# Patient Record
Sex: Male | Born: 1955
Health system: Southern US, Community
[De-identification: ages and names within clinical notes are randomized; demographics above are authoritative.]

## PROBLEM LIST (undated history)

## (undated) DIAGNOSIS — G4733 Obstructive sleep apnea (adult) (pediatric): Secondary | ICD-10-CM

## (undated) DIAGNOSIS — F419 Anxiety disorder, unspecified: Secondary | ICD-10-CM

## (undated) DIAGNOSIS — K7581 Nonalcoholic steatohepatitis (NASH): Secondary | ICD-10-CM

## (undated) DIAGNOSIS — F329 Major depressive disorder, single episode, unspecified: Secondary | ICD-10-CM

## (undated) DIAGNOSIS — M25519 Pain in unspecified shoulder: Secondary | ICD-10-CM

## (undated) DIAGNOSIS — M25569 Pain in unspecified knee: Secondary | ICD-10-CM

## (undated) DIAGNOSIS — C8590 Non-Hodgkin lymphoma, unspecified, unspecified site: Secondary | ICD-10-CM

## (undated) DIAGNOSIS — I739 Peripheral vascular disease, unspecified: Secondary | ICD-10-CM

## (undated) DIAGNOSIS — J449 Chronic obstructive pulmonary disease, unspecified: Secondary | ICD-10-CM

## (undated) DIAGNOSIS — N183 Chronic kidney disease, stage 3 unspecified: Secondary | ICD-10-CM

## (undated) DIAGNOSIS — J9610 Chronic respiratory failure, unspecified whether with hypoxia or hypercapnia: Secondary | ICD-10-CM

## (undated) DIAGNOSIS — K7460 Unspecified cirrhosis of liver: Secondary | ICD-10-CM

## (undated) DIAGNOSIS — I4821 Permanent atrial fibrillation: Secondary | ICD-10-CM

## (undated) DIAGNOSIS — I4819 Other persistent atrial fibrillation: Secondary | ICD-10-CM

## (undated) DIAGNOSIS — E662 Morbid (severe) obesity with alveolar hypoventilation: Secondary | ICD-10-CM

## (undated) DIAGNOSIS — I2781 Cor pulmonale (chronic): Secondary | ICD-10-CM

## (undated) DIAGNOSIS — I2609 Other pulmonary embolism with acute cor pulmonale: Secondary | ICD-10-CM

## (undated) DIAGNOSIS — F32A Depression, unspecified: Secondary | ICD-10-CM

## (undated) DIAGNOSIS — I8390 Asymptomatic varicose veins of unspecified lower extremity: Secondary | ICD-10-CM

## (undated) DIAGNOSIS — M549 Dorsalgia, unspecified: Secondary | ICD-10-CM

## (undated) DIAGNOSIS — M7989 Other specified soft tissue disorders: Secondary | ICD-10-CM

## (undated) DIAGNOSIS — IMO0002 Reserved for concepts with insufficient information to code with codable children: Secondary | ICD-10-CM

## (undated) DIAGNOSIS — C801 Malignant (primary) neoplasm, unspecified: Secondary | ICD-10-CM

## (undated) DIAGNOSIS — K469 Unspecified abdominal hernia without obstruction or gangrene: Secondary | ICD-10-CM

## (undated) DIAGNOSIS — I509 Heart failure, unspecified: Secondary | ICD-10-CM

## (undated) DIAGNOSIS — I5032 Chronic diastolic (congestive) heart failure: Secondary | ICD-10-CM

## (undated) DIAGNOSIS — I272 Pulmonary hypertension, unspecified: Secondary | ICD-10-CM

## (undated) DIAGNOSIS — M1990 Unspecified osteoarthritis, unspecified site: Secondary | ICD-10-CM

## (undated) DIAGNOSIS — R600 Localized edema: Secondary | ICD-10-CM

## (undated) HISTORY — PX: OTHER SURGICAL HISTORY: SHX169

## (undated) HISTORY — DX: Reserved for concepts with insufficient information to code with codable children: IMO0002

## (undated) HISTORY — PX: APPENDECTOMY: SHX54

## (undated) HISTORY — DX: Pain in unspecified knee: M25.569

## (undated) HISTORY — PX: HERNIA REPAIR: SHX51

## (undated) HISTORY — DX: Dorsalgia, unspecified: M54.9

## (undated) HISTORY — DX: Other persistent atrial fibrillation: I48.19

## (undated) HISTORY — DX: Asymptomatic varicose veins of unspecified lower extremity: I83.90

## (undated) HISTORY — DX: Unspecified osteoarthritis, unspecified site: M19.90

## (undated) HISTORY — DX: Pain in unspecified shoulder: M25.519

---

## 1998-10-02 ENCOUNTER — Encounter: Admission: RE | Admit: 1998-10-02 | Discharge: 1998-11-19 | Payer: Self-pay | Admitting: Family Medicine

## 1998-11-15 HISTORY — PX: PORTACATH PLACEMENT: SHX2246

## 1999-09-24 ENCOUNTER — Ambulatory Visit (HOSPITAL_COMMUNITY): Admission: RE | Admit: 1999-09-24 | Discharge: 1999-09-24 | Payer: Self-pay | Admitting: Urology

## 1999-09-24 ENCOUNTER — Encounter (INDEPENDENT_AMBULATORY_CARE_PROVIDER_SITE_OTHER): Payer: Self-pay | Admitting: Specialist

## 2000-03-11 ENCOUNTER — Encounter: Payer: Self-pay | Admitting: Internal Medicine

## 2000-03-11 ENCOUNTER — Emergency Department (HOSPITAL_COMMUNITY): Admission: EM | Admit: 2000-03-11 | Discharge: 2000-03-11 | Payer: Self-pay | Admitting: Internal Medicine

## 2004-09-25 ENCOUNTER — Encounter: Admission: RE | Admit: 2004-09-25 | Discharge: 2004-09-25 | Payer: Self-pay | Admitting: Family Medicine

## 2004-09-30 ENCOUNTER — Ambulatory Visit: Payer: Self-pay | Admitting: Hematology & Oncology

## 2004-10-06 ENCOUNTER — Encounter (INDEPENDENT_AMBULATORY_CARE_PROVIDER_SITE_OTHER): Payer: Self-pay | Admitting: General Surgery

## 2004-10-06 ENCOUNTER — Encounter (INDEPENDENT_AMBULATORY_CARE_PROVIDER_SITE_OTHER): Payer: Self-pay | Admitting: Specialist

## 2004-10-06 ENCOUNTER — Ambulatory Visit (HOSPITAL_COMMUNITY): Admission: RE | Admit: 2004-10-06 | Discharge: 2004-10-06 | Payer: Self-pay | Admitting: General Surgery

## 2004-10-13 ENCOUNTER — Encounter (INDEPENDENT_AMBULATORY_CARE_PROVIDER_SITE_OTHER): Payer: Self-pay | Admitting: Specialist

## 2004-10-13 ENCOUNTER — Ambulatory Visit (HOSPITAL_COMMUNITY): Admission: RE | Admit: 2004-10-13 | Discharge: 2004-10-13 | Payer: Self-pay | Admitting: Hematology & Oncology

## 2004-10-15 ENCOUNTER — Ambulatory Visit (HOSPITAL_COMMUNITY): Admission: RE | Admit: 2004-10-15 | Discharge: 2004-10-15 | Payer: Self-pay | Admitting: General Surgery

## 2004-11-30 ENCOUNTER — Ambulatory Visit: Payer: Self-pay | Admitting: Hematology & Oncology

## 2005-01-04 ENCOUNTER — Ambulatory Visit (HOSPITAL_COMMUNITY): Admission: RE | Admit: 2005-01-04 | Discharge: 2005-01-04 | Payer: Self-pay | Admitting: Hematology & Oncology

## 2005-01-27 ENCOUNTER — Ambulatory Visit: Payer: Self-pay | Admitting: Hematology & Oncology

## 2005-02-22 ENCOUNTER — Ambulatory Visit: Payer: Self-pay | Admitting: Hematology & Oncology

## 2005-03-15 ENCOUNTER — Ambulatory Visit (HOSPITAL_COMMUNITY): Admission: RE | Admit: 2005-03-15 | Discharge: 2005-03-15 | Payer: Self-pay | Admitting: Hematology & Oncology

## 2005-04-02 ENCOUNTER — Ambulatory Visit: Payer: Self-pay | Admitting: Hematology & Oncology

## 2005-04-13 ENCOUNTER — Ambulatory Visit (HOSPITAL_COMMUNITY): Admission: RE | Admit: 2005-04-13 | Discharge: 2005-04-13 | Payer: Self-pay | Admitting: Hematology & Oncology

## 2005-04-13 ENCOUNTER — Encounter (INDEPENDENT_AMBULATORY_CARE_PROVIDER_SITE_OTHER): Payer: Self-pay | Admitting: *Deleted

## 2005-04-21 ENCOUNTER — Ambulatory Visit: Payer: Self-pay | Admitting: Hematology & Oncology

## 2005-06-25 ENCOUNTER — Ambulatory Visit: Payer: Self-pay | Admitting: Hematology & Oncology

## 2005-08-12 ENCOUNTER — Ambulatory Visit: Payer: Self-pay | Admitting: Hematology & Oncology

## 2005-08-31 ENCOUNTER — Ambulatory Visit (HOSPITAL_COMMUNITY): Admission: RE | Admit: 2005-08-31 | Discharge: 2005-08-31 | Payer: Self-pay | Admitting: Hematology & Oncology

## 2005-10-14 ENCOUNTER — Ambulatory Visit: Payer: Self-pay | Admitting: Hematology & Oncology

## 2005-11-11 ENCOUNTER — Ambulatory Visit (HOSPITAL_COMMUNITY): Admission: RE | Admit: 2005-11-11 | Discharge: 2005-11-11 | Payer: Self-pay | Admitting: Hematology & Oncology

## 2005-11-12 ENCOUNTER — Encounter (INDEPENDENT_AMBULATORY_CARE_PROVIDER_SITE_OTHER): Payer: Self-pay | Admitting: Interventional Radiology

## 2005-11-12 ENCOUNTER — Encounter (INDEPENDENT_AMBULATORY_CARE_PROVIDER_SITE_OTHER): Payer: Self-pay | Admitting: *Deleted

## 2005-11-12 ENCOUNTER — Ambulatory Visit (HOSPITAL_COMMUNITY): Admission: RE | Admit: 2005-11-12 | Discharge: 2005-11-12 | Payer: Self-pay | Admitting: Hematology & Oncology

## 2005-11-17 ENCOUNTER — Ambulatory Visit (HOSPITAL_COMMUNITY): Admission: RE | Admit: 2005-11-17 | Discharge: 2005-11-17 | Payer: Self-pay | Admitting: Family Medicine

## 2005-11-22 ENCOUNTER — Ambulatory Visit (HOSPITAL_COMMUNITY): Admission: RE | Admit: 2005-11-22 | Discharge: 2005-11-22 | Payer: Self-pay | Admitting: Gastroenterology

## 2005-11-24 ENCOUNTER — Ambulatory Visit (HOSPITAL_COMMUNITY): Admission: RE | Admit: 2005-11-24 | Discharge: 2005-11-24 | Payer: Self-pay | Admitting: Gastroenterology

## 2005-12-02 ENCOUNTER — Ambulatory Visit (HOSPITAL_COMMUNITY): Admission: RE | Admit: 2005-12-02 | Discharge: 2005-12-02 | Payer: Self-pay | Admitting: Gastroenterology

## 2005-12-02 ENCOUNTER — Encounter (INDEPENDENT_AMBULATORY_CARE_PROVIDER_SITE_OTHER): Payer: Self-pay | Admitting: *Deleted

## 2005-12-08 ENCOUNTER — Ambulatory Visit (HOSPITAL_COMMUNITY): Admission: RE | Admit: 2005-12-08 | Discharge: 2005-12-08 | Payer: Self-pay | Admitting: Gastroenterology

## 2005-12-17 ENCOUNTER — Ambulatory Visit: Payer: Self-pay | Admitting: Hematology & Oncology

## 2006-02-01 ENCOUNTER — Ambulatory Visit: Payer: Self-pay | Admitting: Hematology & Oncology

## 2006-03-03 ENCOUNTER — Ambulatory Visit (HOSPITAL_COMMUNITY): Admission: RE | Admit: 2006-03-03 | Discharge: 2006-03-03 | Payer: Self-pay | Admitting: Hematology & Oncology

## 2006-03-16 LAB — CBC WITH DIFFERENTIAL/PLATELET
Basophils Absolute: 0 10*3/uL (ref 0.0–0.1)
EOS%: 3 % (ref 0.0–7.0)
HGB: 13.2 g/dL (ref 13.0–17.1)
MCH: 35.8 pg — ABNORMAL HIGH (ref 28.0–33.4)
NEUT#: 2.1 10*3/uL (ref 1.5–6.5)
RBC: 3.7 10*6/uL — ABNORMAL LOW (ref 4.20–5.71)
RDW: 13.4 % (ref 11.2–14.6)
lymph#: 0.5 10*3/uL — ABNORMAL LOW (ref 0.9–3.3)

## 2006-03-16 LAB — COMPREHENSIVE METABOLIC PANEL
ALT: 23 U/L (ref 0–40)
AST: 32 U/L (ref 0–37)
Albumin: 3.6 g/dL (ref 3.5–5.2)
BUN: 14 mg/dL (ref 6–23)
Calcium: 8.6 mg/dL (ref 8.4–10.5)
Chloride: 104 mEq/L (ref 96–112)
Potassium: 3.6 mEq/L (ref 3.5–5.3)
Sodium: 137 mEq/L (ref 135–145)
Total Protein: 6 g/dL (ref 6.0–8.3)

## 2006-03-16 LAB — LACTATE DEHYDROGENASE: LDH: 134 U/L (ref 94–250)

## 2006-04-18 ENCOUNTER — Encounter: Admission: RE | Admit: 2006-04-18 | Discharge: 2006-04-18 | Payer: Self-pay | Admitting: Family Medicine

## 2006-04-26 ENCOUNTER — Ambulatory Visit (HOSPITAL_COMMUNITY): Admission: RE | Admit: 2006-04-26 | Discharge: 2006-04-26 | Payer: Self-pay | Admitting: Family Medicine

## 2006-05-10 ENCOUNTER — Ambulatory Visit: Payer: Self-pay | Admitting: Hematology & Oncology

## 2006-05-13 LAB — CBC WITH DIFFERENTIAL/PLATELET
Basophils Absolute: 0 10*3/uL (ref 0.0–0.1)
Eosinophils Absolute: 0.1 10*3/uL (ref 0.0–0.5)
HCT: 34.6 % — ABNORMAL LOW (ref 38.7–49.9)
HGB: 12.1 g/dL — ABNORMAL LOW (ref 13.0–17.1)
MONO#: 0.3 10*3/uL (ref 0.1–0.9)
NEUT%: 73.5 % (ref 40.0–75.0)
WBC: 3 10*3/uL — ABNORMAL LOW (ref 4.0–10.0)
lymph#: 0.4 10*3/uL — ABNORMAL LOW (ref 0.9–3.3)

## 2006-05-13 LAB — COMPREHENSIVE METABOLIC PANEL
ALT: 16 U/L (ref 0–40)
BUN: 15 mg/dL (ref 6–23)
CO2: 24 mEq/L (ref 19–32)
Calcium: 8.2 mg/dL — ABNORMAL LOW (ref 8.4–10.5)
Chloride: 107 mEq/L (ref 96–112)
Creatinine, Ser: 0.83 mg/dL (ref 0.40–1.50)

## 2006-05-13 LAB — LACTATE DEHYDROGENASE: LDH: 136 U/L (ref 94–250)

## 2006-05-29 ENCOUNTER — Encounter
Admission: RE | Admit: 2006-05-29 | Discharge: 2006-05-29 | Payer: Self-pay | Admitting: Physical Medicine and Rehabilitation

## 2006-05-30 ENCOUNTER — Ambulatory Visit (HOSPITAL_COMMUNITY): Admission: RE | Admit: 2006-05-30 | Discharge: 2006-05-30 | Payer: Self-pay | Admitting: Gastroenterology

## 2006-07-07 ENCOUNTER — Emergency Department (HOSPITAL_COMMUNITY): Admission: EM | Admit: 2006-07-07 | Discharge: 2006-07-07 | Payer: Self-pay | Admitting: Emergency Medicine

## 2006-08-03 ENCOUNTER — Ambulatory Visit: Payer: Self-pay | Admitting: Hematology & Oncology

## 2006-08-05 LAB — CBC WITH DIFFERENTIAL/PLATELET
BASO%: 0.3 % (ref 0.0–2.0)
EOS%: 4.1 % (ref 0.0–7.0)
MCH: 32.9 pg (ref 28.0–33.4)
MCHC: 34.6 g/dL (ref 32.0–35.9)
NEUT%: 69.4 % (ref 40.0–75.0)
RDW: 15.8 % — ABNORMAL HIGH (ref 11.2–14.6)
lymph#: 0.4 10*3/uL — ABNORMAL LOW (ref 0.9–3.3)

## 2006-08-05 LAB — COMPREHENSIVE METABOLIC PANEL
ALT: 29 U/L (ref 0–40)
AST: 27 U/L (ref 0–37)
Alkaline Phosphatase: 82 U/L (ref 39–117)
Calcium: 8.6 mg/dL (ref 8.4–10.5)
Chloride: 107 mEq/L (ref 96–112)
Creatinine, Ser: 0.84 mg/dL (ref 0.40–1.50)

## 2006-08-29 ENCOUNTER — Ambulatory Visit (HOSPITAL_COMMUNITY): Admission: RE | Admit: 2006-08-29 | Discharge: 2006-08-29 | Payer: Self-pay | Admitting: General Surgery

## 2006-11-01 ENCOUNTER — Ambulatory Visit: Payer: Self-pay | Admitting: Hematology & Oncology

## 2006-11-04 LAB — COMPREHENSIVE METABOLIC PANEL
ALT: 37 U/L (ref 0–53)
CO2: 25 mEq/L (ref 19–32)
Calcium: 8.7 mg/dL (ref 8.4–10.5)
Chloride: 110 mEq/L (ref 96–112)
Sodium: 141 mEq/L (ref 135–145)
Total Protein: 5.9 g/dL — ABNORMAL LOW (ref 6.0–8.3)

## 2006-11-04 LAB — CBC WITH DIFFERENTIAL/PLATELET
BASO%: 0.4 % (ref 0.0–2.0)
HCT: 36.5 % — ABNORMAL LOW (ref 38.7–49.9)
MCHC: 35 g/dL (ref 32.0–35.9)
MONO#: 0.2 10*3/uL (ref 0.1–0.9)
RBC: 3.87 10*6/uL — ABNORMAL LOW (ref 4.20–5.71)
WBC: 2.2 10*3/uL — ABNORMAL LOW (ref 4.0–10.0)
lymph#: 0.5 10*3/uL — ABNORMAL LOW (ref 0.9–3.3)

## 2006-11-04 LAB — LACTATE DEHYDROGENASE: LDH: 145 U/L (ref 94–250)

## 2006-12-24 ENCOUNTER — Encounter (INDEPENDENT_AMBULATORY_CARE_PROVIDER_SITE_OTHER): Payer: Self-pay | Admitting: Specialist

## 2006-12-24 ENCOUNTER — Inpatient Hospital Stay (HOSPITAL_COMMUNITY): Admission: EM | Admit: 2006-12-24 | Discharge: 2006-12-26 | Payer: Self-pay | Admitting: Emergency Medicine

## 2007-01-31 ENCOUNTER — Ambulatory Visit: Payer: Self-pay | Admitting: Hematology & Oncology

## 2007-02-03 LAB — COMPREHENSIVE METABOLIC PANEL
ALT: 22 U/L (ref 0–53)
AST: 20 U/L (ref 0–37)
Albumin: 4.2 g/dL (ref 3.5–5.2)
Alkaline Phosphatase: 77 U/L (ref 39–117)
Calcium: 9.2 mg/dL (ref 8.4–10.5)
Chloride: 104 mEq/L (ref 96–112)
Potassium: 4.1 mEq/L (ref 3.5–5.3)
Sodium: 143 mEq/L (ref 135–145)
Total Protein: 6.4 g/dL (ref 6.0–8.3)

## 2007-02-03 LAB — CBC WITH DIFFERENTIAL/PLATELET
BASO%: 0.7 % (ref 0.0–2.0)
EOS%: 2.9 % (ref 0.0–7.0)
HGB: 13.5 g/dL (ref 13.0–17.1)
MCH: 32.8 pg (ref 28.0–33.4)
MCV: 93 fL (ref 81.6–98.0)
MONO%: 7.8 % (ref 0.0–13.0)
RBC: 4.12 10*6/uL — ABNORMAL LOW (ref 4.20–5.71)
RDW: 14.6 % (ref 11.2–14.6)
lymph#: 1.2 10*3/uL (ref 0.9–3.3)

## 2007-03-15 ENCOUNTER — Encounter: Admission: RE | Admit: 2007-03-15 | Discharge: 2007-03-15 | Payer: Self-pay | Admitting: Orthopaedic Surgery

## 2007-04-28 ENCOUNTER — Ambulatory Visit (HOSPITAL_COMMUNITY): Admission: RE | Admit: 2007-04-28 | Discharge: 2007-04-28 | Payer: Self-pay | Admitting: Hematology & Oncology

## 2007-05-02 ENCOUNTER — Ambulatory Visit: Payer: Self-pay | Admitting: Hematology & Oncology

## 2007-05-05 LAB — LACTATE DEHYDROGENASE: LDH: 125 U/L (ref 94–250)

## 2007-05-05 LAB — COMPREHENSIVE METABOLIC PANEL
AST: 18 U/L (ref 0–37)
Alkaline Phosphatase: 69 U/L (ref 39–117)
Glucose, Bld: 105 mg/dL — ABNORMAL HIGH (ref 70–99)
Sodium: 141 mEq/L (ref 135–145)
Total Bilirubin: 0.6 mg/dL (ref 0.3–1.2)
Total Protein: 6 g/dL (ref 6.0–8.3)

## 2007-05-05 LAB — CBC WITH DIFFERENTIAL/PLATELET
EOS%: 2.9 % (ref 0.0–7.0)
Eosinophils Absolute: 0.1 10*3/uL (ref 0.0–0.5)
LYMPH%: 27.8 % (ref 14.0–48.0)
MCH: 33 pg (ref 28.0–33.4)
MCHC: 35.2 g/dL (ref 32.0–35.9)
MCV: 93.8 fL (ref 81.6–98.0)
MONO%: 8.9 % (ref 0.0–13.0)
NEUT#: 2.1 10*3/uL (ref 1.5–6.5)
Platelets: 58 10*3/uL — ABNORMAL LOW (ref 145–400)
RBC: 3.99 10*6/uL — ABNORMAL LOW (ref 4.20–5.71)

## 2007-07-20 ENCOUNTER — Encounter (INDEPENDENT_AMBULATORY_CARE_PROVIDER_SITE_OTHER): Payer: Self-pay | Admitting: Family Medicine

## 2007-07-20 ENCOUNTER — Ambulatory Visit (HOSPITAL_COMMUNITY): Admission: RE | Admit: 2007-07-20 | Discharge: 2007-07-20 | Payer: Self-pay | Admitting: Family Medicine

## 2007-07-20 ENCOUNTER — Ambulatory Visit: Payer: Self-pay | Admitting: Vascular Surgery

## 2007-08-02 ENCOUNTER — Ambulatory Visit: Payer: Self-pay | Admitting: Hematology & Oncology

## 2007-08-04 LAB — CBC WITH DIFFERENTIAL/PLATELET
BASO%: 0.3 % (ref 0.0–2.0)
Basophils Absolute: 0 10*3/uL (ref 0.0–0.1)
EOS%: 2.9 % (ref 0.0–7.0)
HCT: 37.4 % — ABNORMAL LOW (ref 38.7–49.9)
HGB: 13.2 g/dL (ref 13.0–17.1)
MCH: 32.6 pg (ref 28.0–33.4)
MCHC: 35.2 g/dL (ref 32.0–35.9)
MCV: 92.7 fL (ref 81.6–98.0)
MONO%: 8.9 % (ref 0.0–13.0)
NEUT%: 61.1 % (ref 40.0–75.0)
RDW: 14.7 % — ABNORMAL HIGH (ref 11.2–14.6)
lymph#: 1.1 10*3/uL (ref 0.9–3.3)

## 2007-08-04 LAB — COMPREHENSIVE METABOLIC PANEL
ALT: 22 U/L (ref 0–53)
AST: 19 U/L (ref 0–37)
Alkaline Phosphatase: 82 U/L (ref 39–117)
BUN: 22 mg/dL (ref 6–23)
Chloride: 104 mEq/L (ref 96–112)
Creatinine, Ser: 1.08 mg/dL (ref 0.40–1.50)

## 2007-12-02 ENCOUNTER — Encounter: Admission: RE | Admit: 2007-12-02 | Discharge: 2007-12-02 | Payer: Self-pay | Admitting: Orthopaedic Surgery

## 2007-12-08 ENCOUNTER — Ambulatory Visit (HOSPITAL_COMMUNITY): Admission: RE | Admit: 2007-12-08 | Discharge: 2007-12-08 | Payer: Self-pay | Admitting: Hematology & Oncology

## 2007-12-13 ENCOUNTER — Ambulatory Visit: Payer: Self-pay | Admitting: Hematology & Oncology

## 2007-12-15 LAB — COMPREHENSIVE METABOLIC PANEL
AST: 13 U/L (ref 0–37)
Alkaline Phosphatase: 73 U/L (ref 39–117)
Glucose, Bld: 76 mg/dL (ref 70–99)
Potassium: 4.3 mEq/L (ref 3.5–5.3)
Sodium: 143 mEq/L (ref 135–145)
Total Bilirubin: 0.7 mg/dL (ref 0.3–1.2)
Total Protein: 6.7 g/dL (ref 6.0–8.3)

## 2007-12-15 LAB — CBC WITH DIFFERENTIAL/PLATELET
Basophils Absolute: 0 10*3/uL (ref 0.0–0.1)
EOS%: 3.1 % (ref 0.0–7.0)
Eosinophils Absolute: 0.1 10*3/uL (ref 0.0–0.5)
HCT: 39.2 % (ref 38.7–49.9)
HGB: 13.4 g/dL (ref 13.0–17.1)
LYMPH%: 24.8 % (ref 14.0–48.0)
MCH: 32.1 pg (ref 28.0–33.4)
MCV: 94.1 fL (ref 81.6–98.0)
MONO%: 7.8 % (ref 0.0–13.0)
NEUT#: 2.8 10*3/uL (ref 1.5–6.5)
NEUT%: 63.6 % (ref 40.0–75.0)
Platelets: 78 10*3/uL — ABNORMAL LOW (ref 145–400)
RDW: 14.8 % — ABNORMAL HIGH (ref 11.2–14.6)

## 2008-04-10 ENCOUNTER — Ambulatory Visit (HOSPITAL_COMMUNITY): Admission: RE | Admit: 2008-04-10 | Discharge: 2008-04-10 | Payer: Self-pay | Admitting: Hematology & Oncology

## 2008-04-10 ENCOUNTER — Ambulatory Visit: Payer: Self-pay | Admitting: Hematology & Oncology

## 2008-04-12 LAB — COMPREHENSIVE METABOLIC PANEL
ALT: 13 U/L (ref 0–53)
AST: 12 U/L (ref 0–37)
Albumin: 4.1 g/dL (ref 3.5–5.2)
BUN: 20 mg/dL (ref 6–23)
Calcium: 8.8 mg/dL (ref 8.4–10.5)
Chloride: 106 mEq/L (ref 96–112)
Potassium: 3.8 mEq/L (ref 3.5–5.3)
Sodium: 142 mEq/L (ref 135–145)
Total Protein: 6.1 g/dL (ref 6.0–8.3)

## 2008-04-12 LAB — CBC WITH DIFFERENTIAL/PLATELET
BASO%: 0.3 % (ref 0.0–2.0)
Basophils Absolute: 0 10*3/uL (ref 0.0–0.1)
EOS%: 2.8 % (ref 0.0–7.0)
HGB: 13.3 g/dL (ref 13.0–17.1)
MCH: 32.3 pg (ref 28.0–33.4)
RDW: 13.8 % (ref 11.2–14.6)
lymph#: 1.3 10*3/uL (ref 0.9–3.3)

## 2008-06-13 ENCOUNTER — Ambulatory Visit (HOSPITAL_COMMUNITY): Admission: RE | Admit: 2008-06-13 | Discharge: 2008-06-13 | Payer: Self-pay | Admitting: Gastroenterology

## 2008-08-14 ENCOUNTER — Ambulatory Visit: Payer: Self-pay | Admitting: Hematology & Oncology

## 2008-08-16 LAB — COMPREHENSIVE METABOLIC PANEL
ALT: 19 U/L (ref 0–53)
BUN: 21 mg/dL (ref 6–23)
CO2: 29 mEq/L (ref 19–32)
Calcium: 8.9 mg/dL (ref 8.4–10.5)
Chloride: 103 mEq/L (ref 96–112)
Creatinine, Ser: 1.19 mg/dL (ref 0.40–1.50)

## 2008-08-16 LAB — LACTATE DEHYDROGENASE: LDH: 128 U/L (ref 94–250)

## 2008-08-16 LAB — CBC WITH DIFFERENTIAL/PLATELET
BASO%: 0.3 % (ref 0.0–2.0)
Basophils Absolute: 0 10*3/uL (ref 0.0–0.1)
EOS%: 1.5 % (ref 0.0–7.0)
MCH: 32.3 pg (ref 28.0–33.4)
MCHC: 34.3 g/dL (ref 32.0–35.9)
MCV: 93.9 fL (ref 81.6–98.0)
MONO%: 4.7 % (ref 0.0–13.0)
RBC: 4.28 10*6/uL (ref 4.20–5.71)
RDW: 14.4 % (ref 11.2–14.6)
lymph#: 1.2 10*3/uL (ref 0.9–3.3)

## 2008-08-19 ENCOUNTER — Ambulatory Visit (HOSPITAL_COMMUNITY): Admission: RE | Admit: 2008-08-19 | Discharge: 2008-08-19 | Payer: Self-pay | Admitting: Hematology & Oncology

## 2008-08-29 ENCOUNTER — Ambulatory Visit: Payer: Self-pay | Admitting: Hematology & Oncology

## 2008-12-30 ENCOUNTER — Ambulatory Visit: Payer: Self-pay | Admitting: Vascular Surgery

## 2009-01-30 ENCOUNTER — Ambulatory Visit: Payer: Self-pay | Admitting: Hematology & Oncology

## 2009-04-23 ENCOUNTER — Ambulatory Visit: Payer: Self-pay | Admitting: Hematology & Oncology

## 2009-04-25 LAB — COMPREHENSIVE METABOLIC PANEL
ALT: 15 U/L (ref 0–53)
AST: 15 U/L (ref 0–37)
Albumin: 4.3 g/dL (ref 3.5–5.2)
CO2: 29 mEq/L (ref 19–32)
Calcium: 8.9 mg/dL (ref 8.4–10.5)
Chloride: 106 mEq/L (ref 96–112)
Potassium: 4.5 mEq/L (ref 3.5–5.3)
Total Protein: 6.5 g/dL (ref 6.0–8.3)

## 2009-04-25 LAB — CBC WITH DIFFERENTIAL (CANCER CENTER ONLY)
BASO%: 0.6 % (ref 0.0–2.0)
EOS%: 2.8 % (ref 0.0–7.0)
HCT: 42.9 % (ref 38.7–49.9)
LYMPH%: 33.2 % (ref 14.0–48.0)
MCH: 31.1 pg (ref 28.0–33.4)
MCHC: 33.4 g/dL (ref 32.0–35.9)
MCV: 93 fL (ref 82–98)
MONO%: 7.9 % (ref 0.0–13.0)
NEUT%: 55.5 % (ref 40.0–80.0)
RDW: 12.4 % (ref 10.5–14.6)

## 2009-04-25 LAB — LACTATE DEHYDROGENASE: LDH: 148 U/L (ref 94–250)

## 2009-05-05 ENCOUNTER — Encounter: Admission: RE | Admit: 2009-05-05 | Discharge: 2009-05-05 | Payer: Self-pay | Admitting: Hematology & Oncology

## 2009-10-30 ENCOUNTER — Ambulatory Visit: Payer: Self-pay | Admitting: Hematology & Oncology

## 2009-10-31 LAB — CBC WITH DIFFERENTIAL (CANCER CENTER ONLY)
Eosinophils Absolute: 0.1 10*3/uL (ref 0.0–0.5)
HCT: 41.8 % (ref 38.7–49.9)
LYMPH%: 31.1 % (ref 14.0–48.0)
MCH: 31.4 pg (ref 28.0–33.4)
MCV: 93 fL (ref 82–98)
MONO#: 0.3 10*3/uL (ref 0.1–0.9)
MONO%: 7.8 % (ref 0.0–13.0)
NEUT%: 57.8 % (ref 40.0–80.0)
RBC: 4.52 10*6/uL (ref 4.20–5.70)
WBC: 3.8 10*3/uL — ABNORMAL LOW (ref 4.0–10.0)

## 2009-11-01 LAB — COMPREHENSIVE METABOLIC PANEL
BUN: 19 mg/dL (ref 6–23)
CO2: 30 mEq/L (ref 19–32)
Calcium: 8.9 mg/dL (ref 8.4–10.5)
Chloride: 103 mEq/L (ref 96–112)
Creatinine, Ser: 1.04 mg/dL (ref 0.40–1.50)
Total Bilirubin: 0.5 mg/dL (ref 0.3–1.2)

## 2009-11-01 LAB — VITAMIN D 25 HYDROXY (VIT D DEFICIENCY, FRACTURES): Vit D, 25-Hydroxy: 20 ng/mL — ABNORMAL LOW (ref 30–89)

## 2010-04-30 ENCOUNTER — Ambulatory Visit: Payer: Self-pay | Admitting: Hematology & Oncology

## 2010-05-01 LAB — COMPREHENSIVE METABOLIC PANEL
BUN: 22 mg/dL (ref 6–23)
CO2: 30 mEq/L (ref 19–32)
Creatinine, Ser: 1.1 mg/dL (ref 0.40–1.50)
Glucose, Bld: 63 mg/dL — ABNORMAL LOW (ref 70–99)
Total Bilirubin: 0.8 mg/dL (ref 0.3–1.2)

## 2010-05-01 LAB — CBC WITH DIFFERENTIAL (CANCER CENTER ONLY)
BASO%: 0.5 % (ref 0.0–2.0)
Eosinophils Absolute: 0.1 10*3/uL (ref 0.0–0.5)
HCT: 43.9 % (ref 38.7–49.9)
LYMPH%: 25.7 % (ref 14.0–48.0)
MCV: 94 fL (ref 82–98)
MONO#: 0.4 10*3/uL (ref 0.1–0.9)
NEUT%: 65.5 % (ref 40.0–80.0)
RDW: 12.2 % (ref 10.5–14.6)
WBC: 5.6 10*3/uL (ref 4.0–10.0)

## 2010-05-01 LAB — VITAMIN D 25 HYDROXY (VIT D DEFICIENCY, FRACTURES): Vit D, 25-Hydroxy: 24 ng/mL — ABNORMAL LOW (ref 30–89)

## 2010-05-01 LAB — LACTATE DEHYDROGENASE: LDH: 150 U/L (ref 94–250)

## 2010-06-10 ENCOUNTER — Ambulatory Visit (HOSPITAL_COMMUNITY): Admission: RE | Admit: 2010-06-10 | Discharge: 2010-06-10 | Payer: Self-pay | Admitting: Specialist

## 2010-07-06 ENCOUNTER — Encounter: Admission: RE | Admit: 2010-07-06 | Discharge: 2010-08-13 | Payer: Self-pay | Admitting: Family Medicine

## 2010-10-22 ENCOUNTER — Ambulatory Visit: Payer: Self-pay | Admitting: Hematology & Oncology

## 2010-10-23 LAB — CBC WITH DIFFERENTIAL (CANCER CENTER ONLY)
BASO#: 0 10*3/uL (ref 0.0–0.2)
Eosinophils Absolute: 0.1 10*3/uL (ref 0.0–0.5)
HGB: 14.9 g/dL (ref 13.0–17.1)
LYMPH#: 1.5 10*3/uL (ref 0.9–3.3)
NEUT#: 2.5 10*3/uL (ref 1.5–6.5)
RBC: 4.72 10*6/uL (ref 4.20–5.70)
WBC: 4.5 10*3/uL (ref 4.0–10.0)

## 2010-10-24 LAB — COMPREHENSIVE METABOLIC PANEL
AST: 15 U/L (ref 0–37)
Albumin: 4.5 g/dL (ref 3.5–5.2)
BUN: 21 mg/dL (ref 6–23)
Calcium: 8.6 mg/dL (ref 8.4–10.5)
Chloride: 102 mEq/L (ref 96–112)
Glucose, Bld: 97 mg/dL (ref 70–99)
Potassium: 4.2 mEq/L (ref 3.5–5.3)
Sodium: 142 mEq/L (ref 135–145)
Total Protein: 6.2 g/dL (ref 6.0–8.3)

## 2010-12-06 ENCOUNTER — Encounter: Payer: Self-pay | Admitting: Hematology & Oncology

## 2011-01-30 LAB — PROTIME-INR
INR: 1.15 (ref 0.00–1.49)
Prothrombin Time: 14.6 seconds (ref 11.6–15.2)

## 2011-01-30 LAB — CBC
Hemoglobin: 13.9 g/dL (ref 13.0–17.0)
MCH: 31.8 pg (ref 26.0–34.0)
MCHC: 33.9 g/dL (ref 30.0–36.0)
MCV: 93.7 fL (ref 78.0–100.0)

## 2011-01-30 LAB — SURGICAL PCR SCREEN: Staphylococcus aureus: NEGATIVE

## 2011-01-30 LAB — COMPREHENSIVE METABOLIC PANEL
AST: 14 U/L (ref 0–37)
BUN: 16 mg/dL (ref 6–23)
CO2: 31 mEq/L (ref 19–32)
Calcium: 9.2 mg/dL (ref 8.4–10.5)
Chloride: 106 mEq/L (ref 96–112)
Creatinine, Ser: 1.05 mg/dL (ref 0.4–1.5)
GFR calc Af Amer: >60 mL/min (ref >60)
GFR calc non Af Amer: >60 mL/min (ref >60)
Glucose, Bld: 87 mg/dL (ref 70–99)
Total Bilirubin: 0.4 mg/dL (ref 0.3–1.2)

## 2011-01-30 LAB — APTT: aPTT: 32 seconds (ref 24–37)

## 2011-03-30 NOTE — Procedures (Signed)
DUPLEX DEEP VENOUS EXAM - LOWER EXTREMITY   INDICATION:  Right lower extremity swelling and pain.   HISTORY:  Edema:  Yes.  Trauma/Surgery:  No.  Pain:  Yes.  PE:  No.  Previous DVT:  Yes.  Anticoagulants:  No.  Other:   DUPLEX EXAM:                CFV   SFV   PopV  PTV    GSV                R  L  R  L  R  L  R   L  R  L  Thrombosis    o  o  o     o            o  Spontaneous   +  +  +     +            +  Phasic        +  +  +     +            +  Augmentation  +  +  +     +            +  Compressible  +  +  +     +            +  Competent     +  +  +     +            +   Legend:  + - yes  o - no  p - partial  D - decreased   IMPRESSION:  1. No evidence of deep or superficial vein thrombosis noted in the      right lower extremity.  2. Unable to image right posterior tibial and peroneal veins due to      swelling at the calf and ankle.  3. Multiple vascularized mass noted in the right groin, possibly      enlarged lymph nodes.    _____________________________  Jessy Oto Fields, MD   AC/MEDQ  D:  12/30/2008  T:  12/31/2008  Job:  [id number]

## 2011-03-30 NOTE — Op Note (Signed)
NAME:  Albert James, Albert James NO.:  [id number]   MEDICAL RECORD NO.:  [id number]          PATIENT TYPE:  AMB   LOCATION:  ENDO                         FACILITY:  [facility]   PHYSICIAN:  Lear Ng, MDDATE OF BIRTH:  December 28, 1955   DATE OF PROCEDURE:  DATE OF DISCHARGE:                               OPERATIVE REPORT   PROCEDURE:  Colonoscopy.   INDICATIONS:  Screening.   MEDICATIONS:  Propofol 390 mg IV per anesthesia, fentanyl 50 mcg IV,  Versed 1 mg IV.   FINDINGS:  Rectal exam was normal.  A pediatric Pentax colonoscope was  inserted through a fairly-prepped colon and advanced to the cecum, where  the ileocecal valve and appendiceal orifice were identified.  The  proximal colon was difficult to evaluate due to semi solid and liquid  stool.  No large mass lesions were seen.  On careful withdrawal of the  colonoscope, no mucosal abnormalities were noted.  Retroflexion was  unremarkable.   ASSESSMENT:  Normal screening, but a fair prep precluded visualization  of small polyps.   PLAN:  Repeat colonoscopy versus barium enema in 3 years.      Lear Ng, MD  Electronically Signed     VCS/MEDQ  D:  06/13/2008  T:  06/13/2008  Job:  [id number]   cc:   Osvaldo Human, M.D.  Fax: [phone]

## 2011-04-02 NOTE — Op Note (Signed)
NAME:  Albert James, CAMPOY NO.:  [id number]   MEDICAL RECORD NO.:  [id number]          PATIENT TYPE:  INP   LOCATION:  5705                         FACILITY:  [facility]   PHYSICIAN:  Merri Ray. Grandville Silos, M.D.DATE OF BIRTH:  30-Aug-1956   DATE OF PROCEDURE:  12/24/2006  DATE OF DISCHARGE:                               OPERATIVE REPORT   PREOPERATIVE DIAGNOSIS:  Acute appendicitis.   POSTOPERATIVE DIAGNOSIS:  Acute appendicitis.   PROCEDURE:  Laparoscopic appendectomy.   SURGEON:  Merri Ray. Grandville Silos, M.D.   ANESTHESIA:  General.   HISTORY OF PRESENT ILLNESS:  The patient is a 55 year old gentleman with  a history of cirrhosis and lymphoma who presented with right lower  quadrant abdominal pain, taken to the emergency room at [facility].  Workup demonstrated acute appendicitis.  He is brought for  emergency appendectomy.   PROCEDURE IN DETAIL:  Informed consent was obtained.  The patient  received intravenous antibiotics.  He was brought to the operating room.  General anesthesia was administered by the anesthesia staff.  His  abdomen was prepped and draped in a sterile fashion.  Area inferior to  his umbilicus with umbilical hernia was injected with 0.25% Marcaine  with epinephrine.  Infraumbilical incision was made.  Subcutaneous  tissues were dissected down revealing the anterior fascia.  This was  divided sharply.  Peritoneal cavity was entered under direct vision.  A  0 Vicryl pursestring suture was placed around the fascial opening and  the Hassan trocar was inserted into the abdomen.  The abdomen was  insufflated with carbon dioxide in a standard fashion.  Under direct  vision, a 12 mm left lower quadrant and a 5 mm right upper quadrant port  were placed.  The 0.25% Marcaine with epinephrine was used at all port  sites.  Laparoscopic exploration revealed an inflamed appendix extending  laterally from the cecum.  This was brought over medially.  The base  was  healthy.  It was dissected.  The base was divided with an endoscopic GIA  stapler with a vascular load.  The mesoappendix was then divided with  several firings of the endoscopic GIA stapler with vascular load.  The  appendix was placed in an EndoCatch bag and removed from the abdomen via  the left lower quadrant port site.  There was some continued ooze for  some time from his staple line.  This was controlled with Bovie cautery  eventually achieving hemostasis.  This ooze was likely exacerbated by  his cirrhosis and portal hypertension.  We were able to get good  hemostasis, however.  The abdomen was copiously irrigated throughout.  Irrigation at the end returned clear.  The remainder of the irrigation  fluid was evacuated.  The staple line area was rechecked and there was  no bleeding.  The ports were then removed under direct vision.  Pneumoperitoneum was released.  The [facility] trocar was removed.  The  fascial defect and the infraumbilical area was closed with 2 figure-of-  eight 0 Vicryl sutures achieving a good closure.  Wounds were copiously  irrigated and  the skin of  each was closed with a running 4-0 Vicryl subcuticular  stitch.  Sponge, needle, and instrument counts were correct.  Benzoin,  Steri-Strips and sterile dressings were applied.  The patient tolerated  the procedure well without apparent complication, was taken to the  recovery room in stable condition.      Merri Ray Grandville Silos, M.D.  Electronically Signed     BET/MEDQ  D:  12/24/2006  T:  12/25/2006  Job:  [id number]   cc:   Lear Ng, MD

## 2011-04-02 NOTE — Op Note (Signed)
NAME:  LOGAN, BALTIMORE NO.:  [id number]   MEDICAL RECORD NO.:  [id number]          PATIENT TYPE:  OUT   LOCATION:  OMED                         FACILITY:  [facility]   PHYSICIAN:  Rudell Cobb. Marin Olp, M.D. DATE OF BIRTH:  [birth year]   DATE OF PROCEDURE:  10/13/2004  DATE OF DISCHARGE:                                 OPERATIVE REPORT   PROCEDURE:  Left posterior iliac crest bone marrow biopsy and aspirate.   Mr. Pickar was brought to the short stay unit. He had an IV placed into  his left hand.   He was placed onto his right side. The left posterior iliac crest region was  prepped and draped in a sterile fashion.  10 mL of lidocaine was infiltrated  under the skin and down to the periosteum. We needed to use an 18 gauge  spinal needle to infiltrate the periosteum.  Despite several attempts, an  aspirate could not be obtained.   With a long bone marrow biopsy needle, two bone marrow cords were obtained  without difficulties.   The patient tolerated the procedure well. There were no complications.   He received a total of 8 mg of Versed and 50 mg of Demerol for IV sedation.     Pete   PRE/MEDQ  D:  10/13/2004  T:  10/13/2004  Job:  [id number]

## 2011-04-02 NOTE — Op Note (Signed)
NAME:  GREYSEN, DEVINO NO.:  [id number]   MEDICAL RECORD NO.:  [id number]          PATIENT TYPE:  AMB   LOCATION:  DAY                          FACILITY:  [facility]   PHYSICIAN:  Edsel Petrin. Dalbert Batman, M.D.DATE OF BIRTH:  [birth year]   DATE OF PROCEDURE:  10/06/2004  DATE OF DISCHARGE:                                 OPERATIVE REPORT   PREOPERATIVE DIAGNOSIS:  Diffuse lymphadenopathy, suspect lymphoma.   POSTOPERATIVE DIAGNOSIS:  Diffuse lymphadenopathy, suspect lymphoma.   OPERATION PERFORMED:  Right cervical lymph node biopsy.   SURGEON:  Edsel Petrin. Dalbert Batman, M.D.   OPERATIVE INDICATIONS:  This is a 55 year old white male who has had some  chronic leg swelling but more recently developed some left upper quadrant  abdominal pain, which was transient.  That led to a CT scan, which showed  extensive thoracic and abdominal retroperitoneal adenopathy extending into  the iliac chains.  There was mild hepatomegaly and moderate splenomegaly  noted.  The radiologist felt that this was consistent with lymphoma.  I was  asked to see the patient for biopsy.   On exam, he is morbidly obese, weighing 375 pounds, being only 6 feet tall.  He has significant bilateral supraclavicular adenopathy, somewhat more  prominent on the right than on the left.  He also had some axillary  adenopathy.  I felt it would be best to try to biopsy one of these  superficial lymph nodes.  He was counseled regarding this and is brought to  the operating room electively.   OPERATIVE TECHNIQUE:  The patient was placed supine on the operating room  table.  General endotracheal anesthesia was used.  With the arms at the  sides and a roll behind his shoulders and the neck turned to the left, the  right neck was then prepped and draped in a sterile fashion.  Marcaine 0.5%  with epinephrine was used as a local infiltration anesthetic.  There were  palpable lymph nodes in the supraclavicular area.  These were  3-4 cm in  size.  I made a gently curved incision overlying the right supraclavicular  area in Langer's lines.  Dissection was carried down through the skin and  platysmal muscle.  Dissected the platysma away from the underlying  sternocleidomastoid muscle.  The sternocleidomastoid muscle was split in the  direction of its fibers and retracted medially and laterally and dissected  off of the underlying lymph nodes that were in the supraclavicular space.  I  dissected out one large lymph node that was about 1.5 x 3.0 cm in size.  Vascular and lymphatic channels were controlled with metal clips and  divided.  The entire node was sent intact.  Dr. Gari Crown looked at the lymph  node and felt that it was a very large, adequate specimen and that no  further tissue would be necessary for lymphoma workup.   The wound was irrigated with saline.  The platysma muscle was closed with  interrupted sutures of 3-0 Vicryl.  The skin was closed with a running  subcuticular suture of 4-0 Monocryl and Steri-Strips.  Clean bandages were  placed.  The  patient was taken to the recovery room in stable condition.  Estimated blood loss was about 5 cc.   COMPLICATIONS:  None.   SPONGE, NEEDLE, INSTRUMENT COUNTS:  Correct.     Hayw   HMI/MEDQ  D:  10/06/2004  T:  10/06/2004  Job:  [id number]   cc:   Osvaldo Human, M.D.  [address]  [place] [address]  Fax: [place]. Marin Olp, M.D.  [address]  [place], [place] [address]  Fax: [phone]

## 2011-04-02 NOTE — Op Note (Signed)
NAME:  Albert James, Albert James NO.:  [id number]   MEDICAL RECORD NO.:  [id number]          PATIENT TYPE:  AMB   LOCATION:  DAY                          FACILITY:  [facility]   PHYSICIAN:  Edsel Petrin. Dalbert Batman, M.D.DATE OF BIRTH:  [birth year]   DATE OF PROCEDURE:  10/15/2004  DATE OF DISCHARGE:                                 OPERATIVE REPORT   PREOPERATIVE DIAGNOSIS:  Lymphoma.   POSTOPERATIVE DIAGNOSIS:  Lymphoma.   OPERATION PERFORMED:  Insertion of 9.6 French Bard port, venous vascular  access device.   SURGEON:  Dr. Fanny Skates   OPERATIVE INDICATIONS:  This is a 55 year old white man, who was recently  diagnosed with lymphoma.  He has been found to have enlarged lymph nodes in  the neck, axilla, mediastinum, and retroperitoneum.  Dr. Marin Olp plans to  begin chemotherapy next week and has requested a Port-A-Cath.  The patient  has been counseled regarding the indication and details of this procedure.  He has been counseled regarding risk and complications, including but not  limited to bleeding, infection, pneumothorax, vascular injury, catheter  embolization, catheter infection, thrombosis, and arm swelling.  He  understands all these issues.  All of his questions have been answered, and  he agrees with this plan.   OPERATIVE TECHNIQUE:  The patient was placed supine on the operating table  with a roll between his shoulders and his arms at his sides.  He was  monitored and sedated by the anesthesia department.  The neck and chest was  prepped and draped in a sterile fashion.  He was given intravenous  antibiotics.  Xylocaine 1% with epinephrine was used as a local infiltration  anesthetic.  A right internal jugular venipuncture was performed and a  guidewire inserted into the superior vena cava under fluoroscopic guidance.  A small transverse incision was made at this site.  There was a little bit  of bleeding from this site which was venous bleeding, but this  stopped with  holding pressure.  A transverse incision was made about 2 cm below the right  clavicle.  A subcutaneous pocket was created.  The catheter was drawn from  the neck incision down to the infraclavicular incision using a small  tunneling device.  The catheter was measured so that the tip would be at the  superior vena cava above the right atrial junction, and then the catheter  was cut.  The catheter was secured to the port with the locking device.  The  catheter and port were flushed with heparinized saline.  The port was  sutured to the pectoralis fascia with two interrupted sutures of 3-0 Vicryl.  With the patient back in Trendelenburg position, the dilator and peel-away-  sheath assembly was inserted over the guidewire into the superior vena cava.  The dilator and wire were removed.  The catheter was inserted through the  peel-away-sheath into the superior vena cava and the peel-away-sheath  removed.  The catheter flushed easily and had excellent blood return.  The  catheter was then flushed with a concentrated heparin solution.  Fluoroscopy  confirmed that the tip of the catheter  was in the superior vena cava, and  there was no kink or crimping of the catheter.  The subcutaneous tissue was  closed with interrupted sutures of 3-0 Vicryl.  The skin was closed with a  running subcuticular suture of 4-0 Monocryl and Steri-Strips.  Clean  bandages were placed and the patient taken to the recovery room in stable  condition.  The estimated blood loss was about 30-40 mL.  Complications  none.  Sponge, needle, and instrument counts were correct.  A chest x-ray  will be obtained.     Hayw   HMI/MEDQ  D:  10/15/2004  T:  10/15/2004  Job:  [id number]   cc:   Osvaldo Human, M.D.  [address]  [place] [address]  Fax: [place]. Marin Olp, M.D.  [address]  [place], [place] [address]  Fax: [phone]

## 2011-04-02 NOTE — H&P (Signed)
NAME:  Albert James, Albert James NO.:  [id number]   MEDICAL RECORD NO.:  [id number]          PATIENT TYPE:  INP   LOCATION:  1828                         FACILITY:  [facility]   PHYSICIAN:  Merri Ray. Grandville Silos, M.D.DATE OF BIRTH:  03-Jul-1956   DATE OF ADMISSION:  12/24/2006  DATE OF DISCHARGE:                              HISTORY & PHYSICAL   CHIEF COMPLAINT:  Right lower quadrant abdominal pain.   HISTORY OF PRESENT ILLNESS:  I was asked to see this 55 year old  gentleman right lower quadrant, right lower quadrant abdominal pain with  Dr. Ashok Cordia from the emergency department.  He developed it earlier this  morning.  The pain persisted, and he came to the [facility] emergency  department for evaluation.  He has a history of cirrhosis and an  umbilical hernia, as well as lymphoma in the past.  Workup included CT  scan of the abdomen and pelvis, which shows acute appendicitis.  He also  has an umbilical hernia.   PAST MEDICAL HISTORY:  Cirrhosis, umbilical hernia and lymphoma.   ALLERGIES:  VANCOMYCIN.   SOCIAL HISTORY:  He does not smoke or drink alcohol.   MEDICATIONS:  Are spironolactone, Lasix, nadolol, all of unknown doses.  He also takes oxycodone p.r.n.   REVIEW OF SYSTEMS:  Is as above.  He has no nausea or vomiting.  No  other complaints are noted.   PHYSICAL EXAM:  VITAL SIGNS:  Temperature 99.4, pulse 67, respirations  20, blood pressure 110/73.  GENERAL:  He is awake, alert, in no acute distress.  HEENT:  Pupils are equal.  Sclera is clear.  NECK:  Supple with no tenderness.  LUNGS:  Clear to auscultation.  HEART:  Regular with no murmurs.  ABDOMEN:  Obese.  He has an easily reducible umbilical hernia that is  large.  He has right lower quadrant tenderness with guarding.  Bowel  sounds are hypoactive.  Extremities have mild edema.  SKIN:  Warm and dry.   LABORATORY STUDIES:  White blood cell count 5.7, hemoglobin 14.2,  platelets 77, sodium 134, potassium  4.1, chloride 101, CO2 25, BUN 18,  creatinine 1, AST 26, ALT 28, lipase 22, PT 14.2, INR 1.1.   IMPRESSION:  A 55 year old with history of cirrhosis and now has acute  appendicitis.   PLAN:  Give him IV antibiotics, take him to the operating room for a  laparoscopic appendectomy.  Procedure, risks and benefits were discussed  in detail with the patient.  He is agreeable.  I also discussed with him  we are unable to fix his umbilical hernia at this time, due to the  necessity of placing mesh, and we cannot place mesh during this infected  case with his appendicitis.  Other questions are answered.      Merri Ray Grandville Silos, M.D.  Electronically Signed     BET/MEDQ  D:  12/24/2006  T:  12/24/2006  Job:  [id number]   cc:   Lear Ng, MD

## 2011-04-02 NOTE — Op Note (Signed)
NAME:  Albert James, Albert James NO.:  [id number]   MEDICAL RECORD NO.:  [id number]          PATIENT TYPE:  OUT   LOCATION:  OMED                         FACILITY:  [facility]   PHYSICIAN:  Rudell Cobb. Marin Olp, M.D. DATE OF BIRTH:  [birth year]   DATE OF PROCEDURE:  04/13/2005  DATE OF DISCHARGE:                                 OPERATIVE REPORT   NATURE OF PROCEDURE:  Left posterior iliac crest bone marrow biopsy.   [name] was brought to the short stay unit.  He had an IV placed into  his right arm.   He was placed onto his right side.   He received a total of 10 mg of Versed for IV sedation.   The left posterior iliac crest region was prepped and draped in sterile  fashion.  He received a total of 15 cc of 2% lidocaine for anesthesia.  We  needed to use a spinal needle to reach the posterior iliac crest.   A #11 scalpel was used to make an incision into the skin.  A long biopsy  needle was used to obtain a marrow biopsy.  This was done without  difficulties.   The patient tolerated the procedure well, with no complications.      PRE/MEDQ  D:  04/13/2005  T:  04/13/2005  Job:  [id number]

## 2011-04-02 NOTE — Discharge Summary (Signed)
NAME:  Albert James, Albert James NO.:  [id number]   MEDICAL RECORD NO.:  [id number]          PATIENT TYPE:  INP   LOCATION:  5705                         FACILITY:  [facility]   PHYSICIAN:  Ebony Hail L. Lissa Merlin, N.P. DATE OF BIRTH:  07-Jun-1956   DATE OF ADMISSION:  12/24/2006  DATE OF DISCHARGE:  12/26/2006                               DISCHARGE SUMMARY   CHIEF COMPLAINT/REASON FOR ADMISSION:  Mr. Bergen is a 55 year old  male patient presented to the ER with right lower quadrant abdominal  pain of less than 12 hours duration.  He underwent a CT scan of the  abdomen and pelvis which demonstrated acute appendicitis as well as an  umbilical hernia known incarcerated.   PHYSICAL EXAMINATION:  On clinical examination, the patient had a low-  grade fever of 99.4.  Vital signs were otherwise stable.  Abdomen demonstrated an easily reducible umbilical hernia which was  large.  Right lower quadrant tenderness with guarding with hypoactive  bowel sounds.   His white blood cell count was 5700.  Hemoglobin 14.2, platelets 77,000.  Patient with known history of cirrhosis and __________ lymphoma.  Sodium  134, potassium 4.1, creatinine 1, AST 26, ALT 28, lipase 22, INR 1.1.   The patient was admitted by Dr. Grandville Silos with a diagnosis of acute  appendicitis.   HOSPITAL COURSE:  The patient was taken to [facility] Emergency  Department to the O.R. where he underwent laparoscopic appendectomy for  acute appendicitis, nonperforated.  The patient was stable in the PACU  and subsequently sent to the general floor to recovery.   Throughout the remainder of the hospitalization, the patient has done  quite well.  His abdomen was mildly tender.  Incision was clean, dry,  and intact.  Bowel sounds were  stable.  He did have some mild ascites  but no other significant abnormalities.   On postoperative day number two, he was evaluated by Dr. Rise Patience and  was tolerating a diet and was deemed  appropriate for discharge home.   FINAL DIAGNOSES:  1. Abdominal pain right lower quadrant secondary to acute      appendicitis.  2. Status post laparoscopic appendectomy per Dr. Grandville Silos.  3. Known cirrhosis with not associated thrombocytopenia.   DISCHARGE MEDICATIONS:  1. The patient is to presume prior home medications at previous doses.      His medications are Nadolol, Lasix, spironolactone, and oxycodone.   RETURN TO WORK:  This will be determined by Dr. Brantley Stage and/or Dr.  Grandville Silos after you are seen by one of them after discharge.   DIET:  Low sodium, heart healthy.   ACTIVITY:  Increase activity slowly.  May walk up the steps.  May  shower.  No lifting more than 15 pounds for two weeks.   WOUND CARE:  Allow Steri-Strips to fall off.   FOLLOWUP:  1. He will have an appointment with Dr. Brantley Stage at [phone] on      February 18 at 9 a.m.  2. Follow up with Dr. Marin Olp.  Call to be seen in the next two to      five weeks.  I will let him know you have had a CT scan done during      this hospitalization which shows retroperitoneal adenopathy,      slightly more prominent than previous; this is in a patient with      known history of lymphoma.      [place] Lissa Merlin, N.P.     ALE/MEDQ  D:  02/17/2007  T:  02/18/2007  Job:  [id number]   cc:   Dr. Frederich Balding A. Cornett, M.D.  Rudell Cobb. Marin Olp, M.D.

## 2011-04-16 ENCOUNTER — Encounter (HOSPITAL_BASED_OUTPATIENT_CLINIC_OR_DEPARTMENT_OTHER): Payer: 59 | Admitting: Hematology & Oncology

## 2011-04-16 ENCOUNTER — Other Ambulatory Visit: Payer: Self-pay | Admitting: Hematology & Oncology

## 2011-04-16 DIAGNOSIS — M869 Osteomyelitis, unspecified: Secondary | ICD-10-CM

## 2011-04-16 DIAGNOSIS — D72819 Decreased white blood cell count, unspecified: Secondary | ICD-10-CM

## 2011-04-16 DIAGNOSIS — C8299 Follicular lymphoma, unspecified, extranodal and solid organ sites: Secondary | ICD-10-CM

## 2011-04-16 DIAGNOSIS — I890 Lymphedema, not elsewhere classified: Secondary | ICD-10-CM

## 2011-04-16 DIAGNOSIS — Z8572 Personal history of non-Hodgkin lymphomas: Secondary | ICD-10-CM

## 2011-04-16 LAB — CBC WITH DIFFERENTIAL (CANCER CENTER ONLY)
BASO%: 0.2 % (ref 0.0–2.0)
EOS%: 2.4 % (ref 0.0–7.0)
LYMPH#: 1.2 10*3/uL (ref 0.9–3.3)
MCHC: 33.8 g/dL (ref 32.0–35.9)
MONO#: 0.3 10*3/uL (ref 0.1–0.9)
NEUT#: 2.9 10*3/uL (ref 1.5–6.5)
NEUT%: 63 % (ref 40.0–80.0)
Platelets: 110 10*3/uL — ABNORMAL LOW (ref 145–400)
RDW: 14.3 % (ref 11.1–15.7)
WBC: 4.6 10*3/uL (ref 4.0–10.0)

## 2011-04-16 LAB — COMPREHENSIVE METABOLIC PANEL
ALT: 20 U/L (ref 0–53)
AST: 14 U/L (ref 0–37)
CO2: 26 mEq/L (ref 19–32)
Calcium: 9 mg/dL (ref 8.4–10.5)
Chloride: 107 mEq/L (ref 96–112)
Creatinine, Ser: 0.88 mg/dL (ref 0.50–1.35)
Sodium: 143 mEq/L (ref 135–145)
Total Protein: 6.3 g/dL (ref 6.0–8.3)

## 2011-04-16 LAB — LACTATE DEHYDROGENASE: LDH: 157 U/L (ref 94–250)

## 2011-04-28 ENCOUNTER — Ambulatory Visit: Payer: 59 | Attending: Hematology & Oncology | Admitting: Physical Therapy

## 2011-04-28 DIAGNOSIS — IMO0001 Reserved for inherently not codable concepts without codable children: Secondary | ICD-10-CM | POA: Insufficient documentation

## 2011-04-28 DIAGNOSIS — I890 Lymphedema, not elsewhere classified: Secondary | ICD-10-CM | POA: Insufficient documentation

## 2011-04-29 ENCOUNTER — Encounter (HOSPITAL_COMMUNITY): Payer: Self-pay

## 2011-04-29 ENCOUNTER — Ambulatory Visit (HOSPITAL_COMMUNITY)
Admission: RE | Admit: 2011-04-29 | Discharge: 2011-04-29 | Disposition: A | Discharge status: Home / Self Care | Payer: 59 | Source: Ambulatory Visit | Attending: Hematology & Oncology | Admitting: Hematology & Oncology

## 2011-04-29 DIAGNOSIS — K458 Other specified abdominal hernia without obstruction or gangrene: Secondary | ICD-10-CM | POA: Insufficient documentation

## 2011-04-29 DIAGNOSIS — C8589 Other specified types of non-Hodgkin lymphoma, extranodal and solid organ sites: Secondary | ICD-10-CM | POA: Insufficient documentation

## 2011-04-29 DIAGNOSIS — Q619 Cystic kidney disease, unspecified: Secondary | ICD-10-CM | POA: Insufficient documentation

## 2011-04-29 DIAGNOSIS — K5900 Constipation, unspecified: Secondary | ICD-10-CM | POA: Insufficient documentation

## 2011-04-29 DIAGNOSIS — R197 Diarrhea, unspecified: Secondary | ICD-10-CM | POA: Insufficient documentation

## 2011-04-29 DIAGNOSIS — R109 Unspecified abdominal pain: Secondary | ICD-10-CM | POA: Insufficient documentation

## 2011-04-29 DIAGNOSIS — R141 Gas pain: Secondary | ICD-10-CM | POA: Insufficient documentation

## 2011-04-29 DIAGNOSIS — R634 Abnormal weight loss: Secondary | ICD-10-CM | POA: Insufficient documentation

## 2011-04-29 DIAGNOSIS — R143 Flatulence: Secondary | ICD-10-CM | POA: Insufficient documentation

## 2011-04-29 DIAGNOSIS — Z8572 Personal history of non-Hodgkin lymphomas: Secondary | ICD-10-CM

## 2011-04-29 DIAGNOSIS — M549 Dorsalgia, unspecified: Secondary | ICD-10-CM | POA: Insufficient documentation

## 2011-04-29 DIAGNOSIS — R142 Eructation: Secondary | ICD-10-CM | POA: Insufficient documentation

## 2011-04-29 HISTORY — DX: Malignant (primary) neoplasm, unspecified: C80.1

## 2011-04-29 MED ORDER — IOHEXOL 300 MG/ML  SOLN
125.0000 mL | Freq: Once | INTRAMUSCULAR | Status: AC | PRN
  Administered 2011-04-29: 125 mL via INTRAVENOUS

## 2011-10-11 ENCOUNTER — Other Ambulatory Visit: Payer: Self-pay | Admitting: *Deleted

## 2011-10-11 MED ORDER — OXYCODONE HCL 20 MG PO TABS: 1.0000 | ORAL_TABLET | Freq: Three times a day (TID) | ORAL | Status: DC | PRN

## 2011-10-11 MED ORDER — OXYCODONE HCL 40 MG PO TB12: 40.0000 mg | ORAL_TABLET | Freq: Two times a day (BID) | ORAL | Status: DC

## 2011-10-11 NOTE — Telephone Encounter (Signed)
Pt called requesting refills for Oxycontin and Oxycodone.

## 2011-10-11 NOTE — Telephone Encounter (Signed)
Rx placed at the front desk for pt to pick up.

## 2011-10-22 ENCOUNTER — Ambulatory Visit: Payer: 59 | Admitting: Hematology & Oncology

## 2011-10-22 ENCOUNTER — Telehealth: Payer: Self-pay | Admitting: *Deleted

## 2011-10-22 ENCOUNTER — Other Ambulatory Visit: Payer: 59 | Admitting: Lab

## 2011-10-22 NOTE — Telephone Encounter (Signed)
Pt called at 258 pm said he missed his 8 am appointment. I scheduled him 1-3 appointment

## 2011-11-01 ENCOUNTER — Other Ambulatory Visit: Payer: Self-pay | Admitting: *Deleted

## 2011-11-01 NOTE — Telephone Encounter (Signed)
Pt left message on the voicemail requesting refills on Oxy-IR 82m and OxyContin 40 mg. Per the computer records, it was last issued on 11/26 and the Oxy-IR was for 20 mg instead of 30. Will check review the request the Dr EMarin Olptomorrow when he returns & check with the pharmacy records.

## 2011-11-08 ENCOUNTER — Other Ambulatory Visit: Payer: Self-pay | Admitting: *Deleted

## 2011-11-08 DIAGNOSIS — R52 Pain, unspecified: Secondary | ICD-10-CM

## 2011-11-08 MED ORDER — OXYCODONE HCL 20 MG PO TABS: 1.0000 | ORAL_TABLET | Freq: Three times a day (TID) | ORAL | Status: DC | PRN

## 2011-11-08 MED ORDER — OXYCODONE HCL 40 MG PO TB12: 40.0000 mg | ORAL_TABLET | Freq: Two times a day (BID) | ORAL | Status: DC

## 2011-11-08 NOTE — Telephone Encounter (Signed)
Patient wanted refill for Oxycodone refilled . Refill routed to Dr. Marin Olp. Oxycodone 40 mg and Oxycontin 20 mg.

## 2011-11-18 ENCOUNTER — Other Ambulatory Visit (HOSPITAL_BASED_OUTPATIENT_CLINIC_OR_DEPARTMENT_OTHER): Payer: 59 | Admitting: Lab

## 2011-11-18 ENCOUNTER — Other Ambulatory Visit: Payer: Self-pay | Admitting: Hematology & Oncology

## 2011-11-18 ENCOUNTER — Ambulatory Visit (HOSPITAL_BASED_OUTPATIENT_CLINIC_OR_DEPARTMENT_OTHER): Payer: 59 | Admitting: Hematology & Oncology

## 2011-11-18 VITALS — BP 107/73 | HR 52 | Temp 98.5°F | Ht 72.0 in | Wt >= 6400 oz

## 2011-11-18 DIAGNOSIS — C8280 Other types of follicular lymphoma, unspecified site: Secondary | ICD-10-CM

## 2011-11-18 DIAGNOSIS — M7989 Other specified soft tissue disorders: Secondary | ICD-10-CM

## 2011-11-18 DIAGNOSIS — M549 Dorsalgia, unspecified: Secondary | ICD-10-CM

## 2011-11-18 DIAGNOSIS — C8299 Follicular lymphoma, unspecified, extranodal and solid organ sites: Secondary | ICD-10-CM

## 2011-11-18 LAB — COMPREHENSIVE METABOLIC PANEL
ALT: 15 U/L (ref 0–53)
BUN: 24 mg/dL — ABNORMAL HIGH (ref 6–23)
CO2: 30 mEq/L (ref 19–32)
Calcium: 9.6 mg/dL (ref 8.4–10.5)
Chloride: 102 mEq/L (ref 96–112)
Creatinine, Ser: 1.05 mg/dL (ref 0.50–1.35)
Total Bilirubin: 0.5 mg/dL (ref 0.3–1.2)

## 2011-11-18 LAB — CBC WITH DIFFERENTIAL (CANCER CENTER ONLY)
BASO#: 0 10*3/uL (ref 0.0–0.2)
BASO%: 0.2 % (ref 0.0–2.0)
EOS%: 4.9 % (ref 0.0–7.0)
HCT: 44 % (ref 38.7–49.9)
HGB: 14.7 g/dL (ref 13.0–17.1)
LYMPH#: 1.9 10*3/uL (ref 0.9–3.3)
LYMPH%: 31.5 % (ref 14.0–48.0)
MCHC: 33.4 g/dL (ref 32.0–35.9)
MCV: 94 fL (ref 82–98)
NEUT%: 53.6 % (ref 40.0–80.0)
RDW: 14.1 % (ref 11.1–15.7)

## 2011-11-18 LAB — LACTATE DEHYDROGENASE: LDH: 149 U/L (ref 94–250)

## 2011-11-18 NOTE — Progress Notes (Signed)
CC:   Lear Ng, MD Mayra Neer, M.D. Susa Day, M.D.  DIAGNOSES: 1. History of follicular small-cell non-Hodgkin lymphoma, in clinical     remission. 2. Severe back pain secondary to vertebral osteomyelitis. 3. Knee pain secondary to the severe degenerative osteoarthritis. 4. Lymphedema of the legs (right greater than left.  CURRENT THERAPY:  Observation.  INTERIM HISTORY:  Mr. Robarts comes in for a 67-monthfollowup.  He is doing okay.  Unfortunately, pain continues to be a little problem for him.  This is unfortunately getting worse.  He just continues to have degeneration of his knees because of his weight.  His back is also starting to give out on him.  He has both vertebral osteomyelitis with subsequent diskitis and collapse of his intervertebral disk in the lower thoracic spine.  He also is developing worsening osteoarthritis and degeneration of the lumbosacral spine secondary to his weight.  He recently has been seen by Dr. SBrigitte Pulseand Dr. SMichail Sermon  So far, everything has looked fairly good with respect to his lymphoma.  He last had scans done back in June.  CT scan of the abdomen and pelvis did not show any evidence of lymphadenopathy.  He had a normal liver, spleen and other organs.  He has had no problems with cough or shortness of breath.  He has had no fever.  He is still trying to work.  He sees Dr. BTonita Congof Orthopedic Surgery for his arthritic issues. Hopefully, Dr. BTonita Congmight be able to help with his back.  He continues on OxyContin 40 mg twice a day for his back.  He is on oxycodone 20 mg p.o. t.i.d. for breakthrough pain.  Again, this is helping him try to maintain some semblance of a lifestyle and is allowing him to work.  PHYSICAL EXAM:  General:  This is a morbidly obese white gentleman in no obvious distress.  Vital Signs:  Temperature 98.5, pulse 52, respiratory rate 18, blood pressure 107/73.  Weight is 427.  Head/Neck:  Exam shows a  normocephalic, atraumatic skull.  There are no ocular or oral lesions. There are no palpable cervical or supraclavicular lymph nodes.  Lungs: Clear bilaterally.  There may be some slight decrease at the bases. Cardiac:  Regular rate and rhythm with a normal S1, S2.  There are no murmurs, rubs or bruits.  Abdomen:  Morbidly obese.  His abdomen is soft.  There is no fluid wave.  There is no palpable hepatosplenomegaly. Extremities:  Massive lymphedema in his legs bilaterally.  Back:  Exam shows tenderness in the thoracic and lumbosacral spine.  Neurologic:  No focal neurological deficits.  LABORATORY STUDIES:  White cell count is 5.6, hemoglobin 14.2, hematocrit 44, platelet count 105.  White cell differential shows 54 segs, 31 lymphocytes.  IMPRESSION:  Mr. HAyreis a 56year old gentleman with a past history of follicular small cell lymphoma.  He actually has stage IV disease.  He was treated with systemic chemotherapy with FCR.  He completed 6 cycles back in March 2006.  One would think that with stage IV follicular lymphoma that he would recur, but so far, there has been no evidence of recurrence.  I do not see the need for any scans on him.  I think his other health issues are much more "active" and are really going to determine his overall prognosis.  We will plan to get him back in 6 more months for followup.    ______________________________ PVolanda Napoleon M.D. PRE/MEDQ  D:  11/18/2011  T:  11/18/2011  Job:  [id number]

## 2011-11-18 NOTE — Progress Notes (Signed)
This office note has been dictated.

## 2011-12-06 ENCOUNTER — Other Ambulatory Visit: Payer: Self-pay | Admitting: Hematology & Oncology

## 2011-12-06 ENCOUNTER — Other Ambulatory Visit: Payer: Self-pay | Admitting: *Deleted

## 2011-12-06 DIAGNOSIS — R52 Pain, unspecified: Secondary | ICD-10-CM

## 2011-12-06 MED ORDER — OXYCODONE HCL 20 MG PO TABS: 1.0000 | ORAL_TABLET | Freq: Three times a day (TID) | ORAL | Status: DC | PRN

## 2011-12-06 MED ORDER — OXYCODONE HCL 40 MG PO TB12: 40.0000 mg | ORAL_TABLET | Freq: Two times a day (BID) | ORAL | Status: DC

## 2011-12-06 NOTE — Telephone Encounter (Signed)
Gave info to RN pt wants pain meds

## 2011-12-06 NOTE — Telephone Encounter (Signed)
Pt called requesting a refill on Oxycontin and Oxycodone. Last filled 11/05/11. Will forward to Dr Marin Olp for approval.

## 2012-01-03 ENCOUNTER — Other Ambulatory Visit: Payer: Self-pay | Admitting: *Deleted

## 2012-01-03 DIAGNOSIS — R52 Pain, unspecified: Secondary | ICD-10-CM

## 2012-01-03 MED ORDER — OXYCODONE HCL 40 MG PO TB12: 40.0000 mg | ORAL_TABLET | Freq: Two times a day (BID) | ORAL | Status: DC

## 2012-01-03 MED ORDER — OXYCODONE HCL 20 MG PO TABS: 1.0000 | ORAL_TABLET | Freq: Three times a day (TID) | ORAL | Status: DC | PRN

## 2012-01-03 NOTE — Telephone Encounter (Signed)
Pt left message on voicemail at 1415 requesting to pick up his Oxycontin and Oxy-IR tomorrow. Will route request to Dr Marin Olp for approval.

## 2012-02-01 ENCOUNTER — Other Ambulatory Visit: Payer: Self-pay | Admitting: *Deleted

## 2012-02-01 DIAGNOSIS — R52 Pain, unspecified: Secondary | ICD-10-CM

## 2012-02-02 MED ORDER — OXYCODONE HCL 40 MG PO TB12: 40.0000 mg | ORAL_TABLET | Freq: Two times a day (BID) | ORAL | Status: DC

## 2012-02-02 MED ORDER — OXYCODONE HCL 20 MG PO TABS: 1.0000 | ORAL_TABLET | Freq: Three times a day (TID) | ORAL | Status: DC | PRN

## 2012-02-02 NOTE — Telephone Encounter (Signed)
Pt called requesting refills for Oxy-IR and OxyContin. He is within parameters for a refill. Will route to Dr Marin Olp for approval.

## 2012-03-02 ENCOUNTER — Other Ambulatory Visit: Payer: Self-pay | Admitting: *Deleted

## 2012-03-02 DIAGNOSIS — R52 Pain, unspecified: Secondary | ICD-10-CM

## 2012-03-02 MED ORDER — OXYCODONE HCL 20 MG PO TABS: 1.0000 | ORAL_TABLET | Freq: Three times a day (TID) | ORAL | Status: DC | PRN

## 2012-03-02 MED ORDER — OXYCODONE HCL 40 MG PO TB12: 40.0000 mg | ORAL_TABLET | Freq: Two times a day (BID) | ORAL | Status: DC

## 2012-03-02 NOTE — Telephone Encounter (Signed)
Pt called requesting a refill for Oxycontin and Oxy-IR. Wants to pick up either today or tomorrow. Reviewed chart. He last had it filled on 02/02/12 & is due for refill. Will route to Dr Marin Olp for approval.

## 2012-03-31 ENCOUNTER — Other Ambulatory Visit: Payer: Self-pay | Admitting: *Deleted

## 2012-03-31 DIAGNOSIS — R52 Pain, unspecified: Secondary | ICD-10-CM

## 2012-03-31 MED ORDER — OXYCODONE HCL 40 MG PO TB12: 40.0000 mg | ORAL_TABLET | Freq: Two times a day (BID) | ORAL | Status: DC

## 2012-03-31 MED ORDER — OXYCODONE HCL 20 MG PO TABS: 1.0000 | ORAL_TABLET | Freq: Three times a day (TID) | ORAL | Status: DC | PRN

## 2012-03-31 NOTE — Telephone Encounter (Signed)
Pt called yesterday requesting to have his OxyContin and Oxycodone refilled. He said it is due to run out on Sunday. Pt is within range for refill. Will route to Dr Marin Olp for approval then place at the front desk for pick up.

## 2012-04-27 ENCOUNTER — Other Ambulatory Visit: Payer: Self-pay | Admitting: *Deleted

## 2012-04-27 DIAGNOSIS — R52 Pain, unspecified: Secondary | ICD-10-CM

## 2012-04-27 MED ORDER — OXYCODONE HCL 20 MG PO TABS: 1.0000 | ORAL_TABLET | Freq: Three times a day (TID) | ORAL | Status: DC | PRN

## 2012-04-27 MED ORDER — OXYCODONE HCL 40 MG PO TB12: 40.0000 mg | ORAL_TABLET | Freq: Two times a day (BID) | ORAL | Status: DC

## 2012-04-27 NOTE — Telephone Encounter (Signed)
Pt called requesting to pick his pain meds up tomorrow. Reviewed chart, his last rx was on 5/17. Will route to Dr Marin Olp for approval.

## 2012-05-03 ENCOUNTER — Encounter: Payer: Self-pay | Admitting: *Deleted

## 2012-05-03 NOTE — Progress Notes (Signed)
May 03, 2012, 1600: Quarterly Update for the [facility] registry trial Contacted [name] on his cell phone to update his status. He reports he is doing well except for knee and back pain. He has not ben hospitalized or had any tests for lymphoma. He is aware I will contact him again in the next quarter.

## 2012-05-25 ENCOUNTER — Other Ambulatory Visit (HOSPITAL_BASED_OUTPATIENT_CLINIC_OR_DEPARTMENT_OTHER): Payer: 59 | Admitting: Lab

## 2012-05-25 ENCOUNTER — Encounter: Payer: 59 | Admitting: Hematology & Oncology

## 2012-05-26 ENCOUNTER — Other Ambulatory Visit: Payer: Self-pay | Admitting: *Deleted

## 2012-05-26 DIAGNOSIS — R52 Pain, unspecified: Secondary | ICD-10-CM

## 2012-05-26 MED ORDER — OXYCODONE HCL 40 MG PO TB12: 40.0000 mg | ORAL_TABLET | Freq: Two times a day (BID) | ORAL | Status: DC

## 2012-05-26 MED ORDER — OXYCODONE HCL 20 MG PO TABS: 1.0000 | ORAL_TABLET | Freq: Three times a day (TID) | ORAL | Status: DC | PRN

## 2012-05-26 NOTE — Telephone Encounter (Signed)
Pt called requesting to pick his pain meds up today. He no-showed for his appt this week, told Liliane Channel that he was out of town. Reviewed chart, his last rx was on 04/27/12. Will route to Dr Marin Olp for approval. To r/s appt in 1 mo per Dr Marin Olp (when his next rx is due).

## 2012-06-05 ENCOUNTER — Ambulatory Visit: Payer: 59 | Admitting: Hematology & Oncology

## 2012-06-05 ENCOUNTER — Other Ambulatory Visit: Payer: 59 | Admitting: Lab

## 2012-06-23 ENCOUNTER — Ambulatory Visit: Payer: 59 | Admitting: Hematology & Oncology

## 2012-06-23 ENCOUNTER — Other Ambulatory Visit: Payer: 59 | Admitting: Lab

## 2012-06-29 ENCOUNTER — Ambulatory Visit (HOSPITAL_BASED_OUTPATIENT_CLINIC_OR_DEPARTMENT_OTHER): Payer: 59 | Admitting: Hematology & Oncology

## 2012-06-29 ENCOUNTER — Other Ambulatory Visit: Payer: 59 | Admitting: Lab

## 2012-06-29 VITALS — BP 114/78 | HR 63 | Temp 97.0°F | Resp 24 | Ht 72.0 in | Wt >= 6400 oz

## 2012-06-29 DIAGNOSIS — C8589 Other specified types of non-Hodgkin lymphoma, extranodal and solid organ sites: Secondary | ICD-10-CM

## 2012-06-29 DIAGNOSIS — M4626 Osteomyelitis of vertebra, lumbar region: Secondary | ICD-10-CM

## 2012-06-29 DIAGNOSIS — C8590 Non-Hodgkin lymphoma, unspecified, unspecified site: Secondary | ICD-10-CM

## 2012-06-29 DIAGNOSIS — R52 Pain, unspecified: Secondary | ICD-10-CM

## 2012-06-29 DIAGNOSIS — M869 Osteomyelitis, unspecified: Secondary | ICD-10-CM

## 2012-06-29 DIAGNOSIS — G8929 Other chronic pain: Secondary | ICD-10-CM

## 2012-06-29 LAB — CBC WITH DIFFERENTIAL (CANCER CENTER ONLY)
BASO%: 0.3 % (ref 0.0–2.0)
EOS%: 2.4 % (ref 0.0–7.0)
LYMPH%: 34.3 % (ref 14.0–48.0)
MCH: 31.2 pg (ref 28.0–33.4)
MCV: 94 fL (ref 82–98)
MONO%: 9.7 % (ref 0.0–13.0)
NEUT#: 3.4 10*3/uL (ref 1.5–6.5)
Platelets: 107 10*3/uL — ABNORMAL LOW (ref 145–400)
RBC: 4.74 10*6/uL (ref 4.20–5.70)
RDW: 14.4 % (ref 11.1–15.7)

## 2012-06-29 LAB — LACTATE DEHYDROGENASE: LDH: 137 U/L (ref 94–250)

## 2012-06-29 MED ORDER — OXYCODONE HCL 20 MG PO TABS: 1.0000 | ORAL_TABLET | Freq: Three times a day (TID) | ORAL | Status: DC | PRN

## 2012-06-29 MED ORDER — OXYCODONE HCL 40 MG PO TB12: 40.0000 mg | ORAL_TABLET | Freq: Two times a day (BID) | ORAL | Status: DC

## 2012-06-29 NOTE — Progress Notes (Signed)
This office note has been dictated.

## 2012-06-30 NOTE — Progress Notes (Signed)
CC:   Mayra Neer, M.D. Susa Day, M.D. Lear Ng, MD  DIAGNOSIS: 1. Follicular small cell non-Hodgkin's lymphoma, clinical remission. 2. Vertebral osteomyelitis with chronic severe back pain. 3. Severe degenerative joint disease of his knees secondary to morbid     obesity. 4. Severe lymphedema of the legs.  CURRENT THERAPY:  Essentially palliative care with aggressive pain control.  INTERIM HISTORY:  Albert James comes in for followup.  He is having a whole lot of problems with his back and with his knees.  His weight is just so much now.  He weighs almost 450.  He just cannot lose weight because he cannot get around.  He has worsening lymphedema in his legs.  He cannot have gastric bypass because of his past liver issues.  He is just stuck in a "bad spot."  He is still trying to work.  He is on oxycodone which thankfully has helped him.  There has been no problems with respect to his lymphoma.  He is now out from treatment by 8-1/2 years.  He completed FCR back in 01/2005.  PHYSICAL EXAMINATION:  General: This is a morbidly obese white gentleman in mild distress secondary to pain.  Vital signs:  Temperature 97, pulse 63, respiratory rate 18, blood pressure 114/78.  Weight is 448.  Head and neck:  Normocephalic, atraumatic skull.  There are no ocular or oral lesions.  There are no palpable cervical or supraclavicular lymph nodes. Lungs:  Clear to percussion and auscultation bilaterally.  Cardiac: Regular rate and rhythm with a normal S1 and S2.  There are no murmurs, rubs or bruits.  Abdomen:  Shows a morbidly obese abdomen.  He has no obvious fluid wave.  There is no guarding or rebound tenderness.  There is no palpable hepatosplenomegaly.  Extremities:  Shows massive lymphedema in his lower legs.  Back exam:  Shows tenderness in the lumbar spine to percussion and palpation.  Skin:  Shows no rashes.  He has marked stasis dermatitis changes in his lower  legs.  LABORATORY STUDIES:  White cell count 6.3, hemoglobin 14.9, hematocrit 44.5, platelet count 107.  IMPRESSION:  Albert James is a 56 year old gentleman with history of follicular small cell lymphoma.  He received 6 cycles of FCR that was completed back in March 2006.  So far, there is no evidence of recurrent disease. We do not need to do any scans on him.  He is asymptomatic from my point of view. His main problems are the osteomyelitis and degenerative joint disease in his knees.  Again, it is a "vicious cycle" in which he cannot do anything because he hurts, he cannot do anything and he is gaining weight.  We did go ahead and refill his pain medications today.  We will plan to get him back to see [place] in 6 more months for followup.    ______________________________ Volanda Napoleon, M.D. PRE/MEDQ  D:  06/29/2012  T:  06/30/2012  Job:  [id number]

## 2012-07-21 ENCOUNTER — Other Ambulatory Visit: Payer: Self-pay | Admitting: *Deleted

## 2012-07-21 DIAGNOSIS — R52 Pain, unspecified: Secondary | ICD-10-CM

## 2012-07-21 DIAGNOSIS — M4626 Osteomyelitis of vertebra, lumbar region: Secondary | ICD-10-CM

## 2012-07-21 MED ORDER — OXYCODONE HCL 40 MG PO TB12: 40.0000 mg | ORAL_TABLET | Freq: Two times a day (BID) | ORAL | Status: DC

## 2012-07-21 MED ORDER — OXYCODONE HCL 20 MG PO TABS: 1.0000 | ORAL_TABLET | Freq: Three times a day (TID) | ORAL | Status: DC | PRN

## 2012-08-22 ENCOUNTER — Other Ambulatory Visit: Payer: Self-pay | Admitting: *Deleted

## 2012-08-22 DIAGNOSIS — M4626 Osteomyelitis of vertebra, lumbar region: Secondary | ICD-10-CM

## 2012-08-22 DIAGNOSIS — R52 Pain, unspecified: Secondary | ICD-10-CM

## 2012-08-23 MED ORDER — OXYCODONE HCL 40 MG PO TB12: 40.0000 mg | ORAL_TABLET | Freq: Two times a day (BID) | ORAL | Status: DC

## 2012-08-23 MED ORDER — OXYCODONE HCL 20 MG PO TABS: 1.0000 | ORAL_TABLET | Freq: Three times a day (TID) | ORAL | Status: DC | PRN

## 2012-08-23 NOTE — Telephone Encounter (Signed)
Pt called on Monday requesting to pick his pain meds this week . Reviewed chart, he is due to have them this week. Will route to Dr Marin Olp for approval & signature then place at the front desk for pick up.

## 2012-09-22 ENCOUNTER — Other Ambulatory Visit: Payer: Self-pay | Admitting: *Deleted

## 2012-09-22 DIAGNOSIS — R52 Pain, unspecified: Secondary | ICD-10-CM

## 2012-09-22 DIAGNOSIS — M4626 Osteomyelitis of vertebra, lumbar region: Secondary | ICD-10-CM

## 2012-09-22 MED ORDER — OXYCODONE HCL 20 MG PO TABS: 1.0000 | ORAL_TABLET | Freq: Three times a day (TID) | ORAL | Status: DC | PRN

## 2012-09-22 MED ORDER — OXYCODONE HCL 40 MG PO TB12: 40.0000 mg | ORAL_TABLET | Freq: Two times a day (BID) | ORAL | Status: DC

## 2012-09-22 NOTE — Telephone Encounter (Signed)
Pt called on Wed requesting to pick his pain meds on Friday . Reviewed chart, last rx was 10/9. Will route to Dr Marin Olp for approval & signature then place at the front desk for pick up.

## 2012-10-23 ENCOUNTER — Other Ambulatory Visit: Payer: Self-pay | Admitting: *Deleted

## 2012-10-23 DIAGNOSIS — R52 Pain, unspecified: Secondary | ICD-10-CM

## 2012-10-23 DIAGNOSIS — M4626 Osteomyelitis of vertebra, lumbar region: Secondary | ICD-10-CM

## 2012-10-23 MED ORDER — OXYCODONE HCL 20 MG PO TABS: 1.0000 | ORAL_TABLET | Freq: Three times a day (TID) | ORAL | Status: DC | PRN

## 2012-10-23 MED ORDER — OXYCODONE HCL 40 MG PO TB12: 40.0000 mg | ORAL_TABLET | Freq: Two times a day (BID) | ORAL | Status: DC

## 2012-10-23 NOTE — Telephone Encounter (Signed)
Pt called requesting to pick his pain meds tomorrow 12/10 . Reviewed chart, last rx was 11/8. Will route to Dr Marin Olp for approval & signature then place at the front desk for pick up.

## 2012-10-25 ENCOUNTER — Encounter: Payer: Self-pay | Admitting: *Deleted

## 2012-10-25 NOTE — Progress Notes (Signed)
Dec. 11, 2013. Quarterly contact for the [facility] Phone call placed to cell phone with no answer. Message left requesting a return call.   Dec. 16, 2013. [facility], q68monthcontact.  Spoke with Mr. HLorentzthis morning. He reports he has not been hospitalized and had any testing done since his appointment with Dr. EMartha Clanlast August. He is aware I will conatct him again in 3 months.

## 2012-11-22 ENCOUNTER — Other Ambulatory Visit: Payer: Self-pay | Admitting: *Deleted

## 2012-11-22 DIAGNOSIS — M4626 Osteomyelitis of vertebra, lumbar region: Secondary | ICD-10-CM

## 2012-11-22 DIAGNOSIS — R52 Pain, unspecified: Secondary | ICD-10-CM

## 2012-11-22 MED ORDER — OXYCODONE HCL 40 MG PO TB12: 40.0000 mg | ORAL_TABLET | Freq: Two times a day (BID) | ORAL | Status: DC

## 2012-11-22 MED ORDER — OXYCODONE HCL 20 MG PO TABS: 1.0000 | ORAL_TABLET | Freq: Three times a day (TID) | ORAL | Status: DC | PRN

## 2012-11-22 NOTE — Telephone Encounter (Signed)
Pt called on Monday requesting to pick his pain meds today. Reviewed chart, last rx was 10/23/12. Will route to Dr Marin Olp for approval & signature then place at the front desk for pick up.

## 2012-11-23 ENCOUNTER — Other Ambulatory Visit: Payer: Self-pay | Admitting: Family Medicine

## 2012-11-23 ENCOUNTER — Ambulatory Visit
Admission: RE | Admit: 2012-11-23 | Discharge: 2012-11-23 | Disposition: A | Discharge status: Home / Self Care | Payer: 59 | Source: Ambulatory Visit | Attending: Family Medicine | Admitting: Family Medicine

## 2012-11-23 DIAGNOSIS — R52 Pain, unspecified: Secondary | ICD-10-CM

## 2012-12-22 ENCOUNTER — Other Ambulatory Visit: Payer: Self-pay | Admitting: *Deleted

## 2012-12-22 ENCOUNTER — Encounter: Payer: Self-pay | Admitting: *Deleted

## 2012-12-25 ENCOUNTER — Other Ambulatory Visit (HOSPITAL_BASED_OUTPATIENT_CLINIC_OR_DEPARTMENT_OTHER): Payer: 59 | Admitting: Lab

## 2012-12-25 ENCOUNTER — Ambulatory Visit (HOSPITAL_BASED_OUTPATIENT_CLINIC_OR_DEPARTMENT_OTHER): Payer: 59 | Admitting: Hematology & Oncology

## 2012-12-25 ENCOUNTER — Encounter: Payer: Self-pay | Admitting: Hematology & Oncology

## 2012-12-25 VITALS — BP 106/69 | HR 63 | Temp 98.3°F | Resp 18 | Ht 72.0 in | Wt >= 6400 oz

## 2012-12-25 DIAGNOSIS — R52 Pain, unspecified: Secondary | ICD-10-CM

## 2012-12-25 DIAGNOSIS — Z87898 Personal history of other specified conditions: Secondary | ICD-10-CM

## 2012-12-25 DIAGNOSIS — G8929 Other chronic pain: Secondary | ICD-10-CM

## 2012-12-25 DIAGNOSIS — M4626 Osteomyelitis of vertebra, lumbar region: Secondary | ICD-10-CM

## 2012-12-25 DIAGNOSIS — C8590 Non-Hodgkin lymphoma, unspecified, unspecified site: Secondary | ICD-10-CM

## 2012-12-25 DIAGNOSIS — M549 Dorsalgia, unspecified: Secondary | ICD-10-CM

## 2012-12-25 LAB — CBC WITH DIFFERENTIAL (CANCER CENTER ONLY)
BASO%: 0.2 % (ref 0.0–2.0)
HCT: 43.8 % (ref 38.7–49.9)
LYMPH%: 32.2 % (ref 14.0–48.0)
MCH: 30.8 pg (ref 28.0–33.4)
MCV: 94 fL (ref 82–98)
MONO#: 0.4 10*3/uL (ref 0.1–0.9)
MONO%: 10 % (ref 0.0–13.0)
NEUT%: 55 % (ref 40.0–80.0)
Platelets: 95 10*3/uL — ABNORMAL LOW (ref 145–400)
RDW: 14.2 % (ref 11.1–15.7)

## 2012-12-25 MED ORDER — OXYCODONE HCL 40 MG PO TB12: 40.0000 mg | ORAL_TABLET | Freq: Two times a day (BID) | ORAL | Status: DC

## 2012-12-25 MED ORDER — OXYCODONE HCL 20 MG PO TABS: 1.0000 | ORAL_TABLET | Freq: Three times a day (TID) | ORAL | Status: DC | PRN

## 2012-12-25 NOTE — Progress Notes (Signed)
This office note has been dictated.

## 2012-12-26 NOTE — Progress Notes (Signed)
CC:   Mayra Neer, M.D.  DIAGNOSES: 1. Follicular small cell non-Hodgkin lymphoma-clinical remission. 2. Vertebral osteomyelitis with chronic back pain. 3. Severe, morbid obesity. 4. Severe lymphedema of his legs. 5. Likely fatty liver.  CURRENT THERAPY:  Supportive care.  INTERIM HISTORY:  Albert James comes in for followup.  We last saw back in August 2013.  He has been doing okay.  He thinks he may have gotten the flu.  He was in bed for a couple of days a week or so ago.  He says he is feeling a little bit better.  He is still dealing with his weight.  Gastric bypass does not appear to be a solution for him because of other health problems.  He has a lot of swelling in his legs.  This been a chronic problem for him.  He has not noticed any bleeding.  He has had no cough.  There has been no shortness of breath.  He has had no headache.  PHYSICAL EXAMINATION:  General:  This is a morbidly obese white gentleman in no obvious distress.  Vital signs:  Temperature of 98.3, pulse 63, respiratory rate 18, blood pressure 106/69.  Weight is 446. Head and neck:  Normocephalic, atraumatic skull.  There are no ocular or oral lesions.  There are no palpable cervical or supraclavicular lymph nodes.  Lungs:  Clear bilaterally.  Cardiac:  Regular rate and rhythm with a normal S1 and S2.  There are no murmurs, rubs, or bruits. Abdomen:  Soft with good bowel sounds.  He is morbidly obese.  No fluid wave is noted.  There is no obvious hepatosplenomegaly.  Back:  Some tenderness in the lower thoracic and lumbosacral spine.  He has some spasms in the lower paraspinal musculature.  Extremities:  Marked lymphedema.  This is chronic.  LABORATORY STUDIES:  White cell count 4.3, hemoglobin 14.3, hematocrit 43.8, platelet count 95,000.  IMPRESSION:  [name] is a 57 year old gentleman with past history of small cell follicular non-Hodgkin lymphoma.  He was treated with FCR. He now is out  8 years.  He completed treatments back in March 2006.  Again, I do not see any issues with respect to lymphoma coming back.  His prognosis clearly is dependent upon his weight and his back issues.  For now, we will just continue to follow him along every 6 months.  I do not see any need for any x-rays.    ______________________________ Volanda Napoleon, M.D. PRE/MEDQ  D:  12/25/2012  T:  12/26/2012  Job:  [id number]

## 2013-01-05 ENCOUNTER — Other Ambulatory Visit: Payer: 59 | Admitting: Lab

## 2013-01-05 ENCOUNTER — Ambulatory Visit: Payer: 59 | Admitting: Hematology & Oncology

## 2013-01-23 ENCOUNTER — Other Ambulatory Visit: Payer: Self-pay | Admitting: *Deleted

## 2013-01-23 DIAGNOSIS — R52 Pain, unspecified: Secondary | ICD-10-CM

## 2013-01-23 DIAGNOSIS — M869 Osteomyelitis, unspecified: Secondary | ICD-10-CM

## 2013-01-23 MED ORDER — OXYCODONE HCL 20 MG PO TABS: 1.0000 | ORAL_TABLET | Freq: Three times a day (TID) | ORAL | Status: DC | PRN

## 2013-01-23 MED ORDER — OXYCODONE HCL 40 MG PO TB12: 40.0000 mg | ORAL_TABLET | Freq: Two times a day (BID) | ORAL | Status: DC

## 2013-01-23 NOTE — Telephone Encounter (Signed)
Pt called on Monday requesting to pick his pain meds today. Says he will run out tomorrow. Reviewed chart, last rx was 12/25/12. Will route to Helayne [place], [place] for approval & signature then place at the front desk for pick up.

## 2013-02-19 ENCOUNTER — Other Ambulatory Visit: Payer: Self-pay | Admitting: *Deleted

## 2013-02-19 DIAGNOSIS — M869 Osteomyelitis, unspecified: Secondary | ICD-10-CM

## 2013-02-19 DIAGNOSIS — R52 Pain, unspecified: Secondary | ICD-10-CM

## 2013-02-19 MED ORDER — OXYCODONE HCL 20 MG PO TABS: 1.0000 | ORAL_TABLET | Freq: Three times a day (TID) | ORAL | Status: DC | PRN

## 2013-02-19 MED ORDER — OXYCODONE HCL 40 MG PO TB12: 40.0000 mg | ORAL_TABLET | Freq: Two times a day (BID) | ORAL | Status: DC

## 2013-02-19 NOTE — Telephone Encounter (Signed)
Pt called on Friday requesting to pick his pain meds today. Reviewed chart, last rx was 01/23/13. Will route to Dr Marin Olp for approval & signature then place at the front desk for pick up.

## 2013-02-28 ENCOUNTER — Telehealth: Payer: Self-pay | Admitting: Hematology & Oncology

## 2013-02-28 NOTE — Telephone Encounter (Signed)
Faxed signed MD Tx Plan checklist to [phone]

## 2013-03-21 ENCOUNTER — Other Ambulatory Visit: Payer: Self-pay | Admitting: *Deleted

## 2013-03-21 DIAGNOSIS — R52 Pain, unspecified: Secondary | ICD-10-CM

## 2013-03-21 DIAGNOSIS — M869 Osteomyelitis, unspecified: Secondary | ICD-10-CM

## 2013-03-21 MED ORDER — OXYCODONE HCL 20 MG PO TABS: 1.0000 | ORAL_TABLET | Freq: Three times a day (TID) | ORAL | Status: DC | PRN

## 2013-03-21 MED ORDER — OXYCODONE HCL 40 MG PO TB12: 40.0000 mg | ORAL_TABLET | Freq: Two times a day (BID) | ORAL | Status: DC

## 2013-03-21 NOTE — Telephone Encounter (Signed)
Pt called on Monday 03/19/13 requesting to pick his pain meds today. Reviewed chart, last rx was 02/19/13. Will route to Dr Marin Olp for approval & signature then place at the front desk for pick up.

## 2013-04-17 ENCOUNTER — Other Ambulatory Visit: Payer: Self-pay | Admitting: *Deleted

## 2013-04-17 DIAGNOSIS — Z8572 Personal history of non-Hodgkin lymphomas: Secondary | ICD-10-CM

## 2013-04-17 DIAGNOSIS — G8929 Other chronic pain: Secondary | ICD-10-CM

## 2013-04-17 DIAGNOSIS — M549 Dorsalgia, unspecified: Secondary | ICD-10-CM | POA: Insufficient documentation

## 2013-04-17 DIAGNOSIS — M4620 Osteomyelitis of vertebra, site unspecified: Secondary | ICD-10-CM

## 2013-04-17 DIAGNOSIS — I890 Lymphedema, not elsewhere classified: Secondary | ICD-10-CM | POA: Insufficient documentation

## 2013-04-17 MED ORDER — OXYCODONE HCL 40 MG PO TB12: 40.0000 mg | ORAL_TABLET | Freq: Two times a day (BID) | ORAL | Status: DC

## 2013-04-17 MED ORDER — OXYCODONE HCL 20 MG PO TABS: 1.0000 | ORAL_TABLET | Freq: Three times a day (TID) | ORAL | Status: DC | PRN

## 2013-04-17 NOTE — Telephone Encounter (Signed)
Pt called on Monday 04/16/13 requesting to pick his pain meds today. Reviewed chart, last rx was 03/21/13. Will route to Dr Marin Olp for approval & signature then place at the front desk for pick up.

## 2013-05-14 ENCOUNTER — Other Ambulatory Visit: Payer: Self-pay | Admitting: *Deleted

## 2013-05-14 DIAGNOSIS — M549 Dorsalgia, unspecified: Secondary | ICD-10-CM

## 2013-05-14 DIAGNOSIS — M4620 Osteomyelitis of vertebra, site unspecified: Secondary | ICD-10-CM

## 2013-05-14 DIAGNOSIS — G8929 Other chronic pain: Secondary | ICD-10-CM

## 2013-05-14 NOTE — Telephone Encounter (Addendum)
Pt called requesting to pick his pain meds today. Reviewed chart, last rx was 03/21/13. Will route to Dr Marin Olp for approval & signature then place at the front desk for pick up.   05/15/13: Re-issued rx for Oycontin and Oxycodone as they didn't print when Dr Marin Olp approved them.

## 2013-05-14 NOTE — Progress Notes (Signed)
error 

## 2013-05-15 MED ORDER — OXYCODONE HCL 40 MG PO TB12: 40.0000 mg | ORAL_TABLET | Freq: Two times a day (BID) | ORAL | Status: DC

## 2013-05-15 MED ORDER — OXYCODONE HCL 20 MG PO TABS: 1.0000 | ORAL_TABLET | Freq: Three times a day (TID) | ORAL | Status: DC | PRN

## 2013-05-15 NOTE — Addendum Note (Signed)
Addended byAlmond Lint N on: 05/15/2013 11:09 AM   Modules accepted: Orders

## 2013-05-23 ENCOUNTER — Ambulatory Visit
Admission: RE | Admit: 2013-05-23 | Discharge: 2013-05-23 | Disposition: A | Discharge status: Home / Self Care | Payer: 59 | Source: Ambulatory Visit | Attending: Family Medicine | Admitting: Family Medicine

## 2013-05-23 ENCOUNTER — Other Ambulatory Visit: Payer: Self-pay | Admitting: Family Medicine

## 2013-05-23 DIAGNOSIS — M549 Dorsalgia, unspecified: Secondary | ICD-10-CM

## 2013-06-01 ENCOUNTER — Telehealth: Payer: Self-pay | Admitting: Hematology & Oncology

## 2013-06-01 NOTE — Telephone Encounter (Signed)
Pt aware moved 8-11 to 8-12

## 2013-06-12 ENCOUNTER — Other Ambulatory Visit: Payer: Self-pay | Admitting: *Deleted

## 2013-06-12 DIAGNOSIS — G8929 Other chronic pain: Secondary | ICD-10-CM

## 2013-06-12 DIAGNOSIS — M4620 Osteomyelitis of vertebra, site unspecified: Secondary | ICD-10-CM

## 2013-06-12 MED ORDER — OXYCODONE HCL 40 MG PO TB12: 40.0000 mg | ORAL_TABLET | Freq: Two times a day (BID) | ORAL | Status: DC

## 2013-06-12 MED ORDER — OXYCODONE HCL 20 MG PO TABS: 1.0000 | ORAL_TABLET | Freq: Three times a day (TID) | ORAL | Status: DC | PRN

## 2013-06-12 NOTE — Telephone Encounter (Signed)
Pt called on Monday 06/11/13 requesting to pick his pain meds tomorrow or Wed.. Reviewed chart, last rx was 05/15/13. Will route to Dr Marin Olp for approval & signature then place at the front desk for pick up.

## 2013-06-25 ENCOUNTER — Other Ambulatory Visit: Payer: 59 | Admitting: Lab

## 2013-06-25 ENCOUNTER — Ambulatory Visit: Payer: 59 | Admitting: Hematology & Oncology

## 2013-06-26 ENCOUNTER — Ambulatory Visit (HOSPITAL_BASED_OUTPATIENT_CLINIC_OR_DEPARTMENT_OTHER): Payer: 59 | Admitting: Hematology & Oncology

## 2013-06-26 ENCOUNTER — Other Ambulatory Visit (HOSPITAL_BASED_OUTPATIENT_CLINIC_OR_DEPARTMENT_OTHER): Payer: 59 | Admitting: Lab

## 2013-06-26 ENCOUNTER — Telehealth: Payer: Self-pay | Admitting: Hematology & Oncology

## 2013-06-26 VITALS — BP 100/78 | HR 95 | Temp 98.0°F | Resp 20 | Wt >= 6400 oz

## 2013-06-26 DIAGNOSIS — Z8572 Personal history of non-Hodgkin lymphomas: Secondary | ICD-10-CM

## 2013-06-26 DIAGNOSIS — L02419 Cutaneous abscess of limb, unspecified: Secondary | ICD-10-CM

## 2013-06-26 DIAGNOSIS — M869 Osteomyelitis, unspecified: Secondary | ICD-10-CM

## 2013-06-26 DIAGNOSIS — C8299 Follicular lymphoma, unspecified, extranodal and solid organ sites: Secondary | ICD-10-CM

## 2013-06-26 DIAGNOSIS — I890 Lymphedema, not elsewhere classified: Secondary | ICD-10-CM

## 2013-06-26 LAB — CBC WITH DIFFERENTIAL (CANCER CENTER ONLY)
BASO#: 0 10*3/uL (ref 0.0–0.2)
BASO%: 0.3 % (ref 0.0–2.0)
EOS%: 3.9 % (ref 0.0–7.0)
Eosinophils Absolute: 0.3 10*3/uL (ref 0.0–0.5)
HCT: 44.4 % (ref 38.7–49.9)
HGB: 14.1 g/dL (ref 13.0–17.1)
LYMPH#: 1.7 10*3/uL (ref 0.9–3.3)
LYMPH%: 25.2 % (ref 14.0–48.0)
MCH: 30.6 pg (ref 28.0–33.4)
MCHC: 31.8 g/dL — ABNORMAL LOW (ref 32.0–35.9)
MCV: 96 fL (ref 82–98)
MONO#: 0.6 10*3/uL (ref 0.1–0.9)
MONO%: 9 % (ref 0.0–13.0)
NEUT#: 4.1 10*3/uL (ref 1.5–6.5)
NEUT%: 61.6 % (ref 40.0–80.0)
Platelets: 112 10*3/uL — ABNORMAL LOW (ref 145–400)
RBC: 4.61 10*6/uL (ref 4.20–5.70)
RDW: 15.1 % (ref 11.1–15.7)
WBC: 6.6 10*3/uL (ref 4.0–10.0)

## 2013-06-26 NOTE — Telephone Encounter (Signed)
Ok per pt left message on his phone 07-13-13 1pm wound center appointment 300D at [facility])

## 2013-06-26 NOTE — Progress Notes (Signed)
This office note has been dictated.

## 2013-06-27 NOTE — Progress Notes (Signed)
DIAGNOSES: 1. Follicular small cell non-Hodgkin lymphoma, clinical remission. 2. Morbid obesity. 3. Lymphedema of his legs. 4. Vertebral osteomyelitis.  CURRENT THERAPY:  Observation.  INTERIM HISTORY:  Albert James comes in for followup.  We see him every 6 months.  Unfortunately, his problem now is with his left leg.  He says he has had an infection in the left lower leg for a few months.  He showed me the leg.  He has these exudative-type lesions.  He has erythema of the left lower leg.  He says he has been treated by his family doctor for this.  I think he probably needs to go to the [facility].  I really worry that this is an indicator of something else going on with his leg.  Otherwise, he is doing okay.  His weight is 452 now.  This is going to be his indicator of mortality.  He is not having any kind of blood pressure issues.  He is still working.  He has had no fever.  He has had no change in bowel or bladder habits.  He was down in [place] for work.  He hurt his, I think, right knee. He sees Dr. Maxie Better for this.  He got a cortisone injection, which did seem to help.  PHYSICAL EXAMINATION:  General:  This is a morbidly obese white gentleman in no obvious distress.  Vital Signs:  Temperature of 98, pulse 95, respiratory rate 20, blood pressure 100/78, weight is 452. Head Exam:  Normocephalic, atraumatic skull.  There are no ocular or oral lesions.  There are no palpable cervical or supraclavicular lymph nodes.  Lungs:  Clear bilaterally.  Cardiac:  Regular rate and rhythm with a normal S1 and S2.  There are no murmurs, rubs or bruits. Abdomen:  Obese.  Abdomen is soft.  He has decent bowel sounds.  There is no palpable hepatosplenomegaly.  Extremities:  Marked lymphedema in his legs.  Again, in the left lower leg, he has erythema.  He has the subcutaneous pustular-type lesions, which are quite large.  LABORATORY STUDIES:  White cell count is 6.6, hemoglobin  14.1, hematocrit 44.4, platelet count 112,000.  IMPRESSION:  Albert James is a nice 57 year old white gentleman.  He had a history of follicular small cell lymphoma.  We treated this with systemic chemotherapy.  He did well with this.  He has been in remission now for over 8 years.  He completed treatment back in March of 2006.  Again, I think the [facility] needs to see him.  We will see about making a referral for the left lower leg.  I do not see any problems with respect to lymphoma coming back.  We will go ahead and plan to get him back to see [place] in another 6 months.    ______________________________ Volanda Napoleon, M.D. PRE/MEDQ  D:  06/26/2013  T:  06/27/2013  Job:  [id number]

## 2013-06-28 ENCOUNTER — Telehealth: Payer: Self-pay | Admitting: Hematology & Oncology

## 2013-06-28 NOTE — Telephone Encounter (Signed)
Left message with wound center 8-29 appointment is not on schedule in epic

## 2013-07-12 ENCOUNTER — Other Ambulatory Visit: Payer: Self-pay | Admitting: *Deleted

## 2013-07-12 DIAGNOSIS — G8929 Other chronic pain: Secondary | ICD-10-CM

## 2013-07-12 DIAGNOSIS — M4620 Osteomyelitis of vertebra, site unspecified: Secondary | ICD-10-CM

## 2013-07-12 MED ORDER — OXYCODONE HCL 20 MG PO TABS: 1.0000 | ORAL_TABLET | Freq: Three times a day (TID) | ORAL | Status: DC | PRN

## 2013-07-12 MED ORDER — OXYCODONE HCL 40 MG PO TB12: 40.0000 mg | ORAL_TABLET | Freq: Two times a day (BID) | ORAL | Status: DC

## 2013-07-13 ENCOUNTER — Encounter (HOSPITAL_BASED_OUTPATIENT_CLINIC_OR_DEPARTMENT_OTHER): Payer: 59 | Attending: General Surgery

## 2013-07-13 DIAGNOSIS — Z9221 Personal history of antineoplastic chemotherapy: Secondary | ICD-10-CM | POA: Insufficient documentation

## 2013-07-13 DIAGNOSIS — I87309 Chronic venous hypertension (idiopathic) without complications of unspecified lower extremity: Secondary | ICD-10-CM | POA: Insufficient documentation

## 2013-07-13 DIAGNOSIS — I872 Venous insufficiency (chronic) (peripheral): Secondary | ICD-10-CM | POA: Insufficient documentation

## 2013-07-13 DIAGNOSIS — Z87898 Personal history of other specified conditions: Secondary | ICD-10-CM | POA: Insufficient documentation

## 2013-07-13 DIAGNOSIS — I890 Lymphedema, not elsewhere classified: Secondary | ICD-10-CM | POA: Insufficient documentation

## 2013-07-13 DIAGNOSIS — L97809 Non-pressure chronic ulcer of other part of unspecified lower leg with unspecified severity: Secondary | ICD-10-CM | POA: Insufficient documentation

## 2013-07-14 NOTE — Progress Notes (Signed)
[facility]  NAME:  [name], [name] NO.:  [id number]  MEDICAL RECORD NO.:  [id number]      DATE OF BIRTH:  [birth year]  PHYSICIAN:  Judene Companion, M.D.           VISIT DATE:                                  OFFICE VISIT   HISTORY:  Albert James is a 57 year old gentleman who is morbidly obese.  He weighs around 500 pounds.  He has a history of lymphoma, which has successfully been treated with chemotherapy.  He also has a history of a ulcer on his left leg.  He has bilateral lymphedema and bilateral venous hypertension.  I debrided the ulcer in his left lower leg with a curette down to the subcu and got too fairly decent granulation tissue.  It is 5 or 6 cm long and a couple of centimeters wide.  We are going to get him ready for lymphedema pumps at home, and I will write a letter to get him okayed for that, and in the meantime, we will treat this ulcer with Santyl and compression, and I did debride in the subcutaneous tissue with a curette today, so he will come back here in a week, and in the meantime, we will get him okayed for lymphedema pumps, and I wrote him a prescription for Santyl.  So while he was here, he was found to have a blood pressure 138/83, respirations 16, pulse 91, temperature 97.5.  He was weighed at 445 pounds.  DIAGNOSES: 1. Venous stasis, venous hypertension, venous stasis ulcer of the left     leg. 2. History of lymphoma, successfully treated with chemotherapy. 3. Morbid obesity.     Judene Companion, M.D.     PP/MEDQ  D:  07/13/2013  T:  07/14/2013  Job:  [id number]

## 2013-07-20 ENCOUNTER — Encounter (HOSPITAL_BASED_OUTPATIENT_CLINIC_OR_DEPARTMENT_OTHER): Payer: 59 | Attending: General Surgery

## 2013-07-20 DIAGNOSIS — I890 Lymphedema, not elsewhere classified: Secondary | ICD-10-CM | POA: Insufficient documentation

## 2013-07-20 DIAGNOSIS — I87309 Chronic venous hypertension (idiopathic) without complications of unspecified lower extremity: Secondary | ICD-10-CM | POA: Insufficient documentation

## 2013-08-08 ENCOUNTER — Other Ambulatory Visit: Payer: Self-pay | Admitting: *Deleted

## 2013-08-08 DIAGNOSIS — M4620 Osteomyelitis of vertebra, site unspecified: Secondary | ICD-10-CM

## 2013-08-08 DIAGNOSIS — G8929 Other chronic pain: Secondary | ICD-10-CM

## 2013-08-08 MED ORDER — OXYCODONE HCL 20 MG PO TABS: 1.0000 | ORAL_TABLET | Freq: Three times a day (TID) | ORAL | Status: DC | PRN

## 2013-08-08 MED ORDER — OXYCODONE HCL 40 MG PO TB12: 40.0000 mg | ORAL_TABLET | Freq: Two times a day (BID) | ORAL | Status: DC

## 2013-08-08 NOTE — Telephone Encounter (Signed)
Pt called yesterday requesting a refill of his pain medications. Reviewed chart, last rx was on 07/12/13. Will route to Dr Marin Olp for approval & signature then place at the front desk for pick up.

## 2013-08-09 NOTE — Progress Notes (Signed)
This encounter was created in error - please disregard.

## 2013-08-17 ENCOUNTER — Encounter (HOSPITAL_BASED_OUTPATIENT_CLINIC_OR_DEPARTMENT_OTHER): Payer: 59 | Attending: General Surgery

## 2013-08-17 DIAGNOSIS — I87319 Chronic venous hypertension (idiopathic) with ulcer of unspecified lower extremity: Secondary | ICD-10-CM | POA: Insufficient documentation

## 2013-08-17 DIAGNOSIS — L97909 Non-pressure chronic ulcer of unspecified part of unspecified lower leg with unspecified severity: Secondary | ICD-10-CM | POA: Insufficient documentation

## 2013-09-11 ENCOUNTER — Other Ambulatory Visit: Payer: Self-pay | Admitting: *Deleted

## 2013-09-11 DIAGNOSIS — M4620 Osteomyelitis of vertebra, site unspecified: Secondary | ICD-10-CM

## 2013-09-11 DIAGNOSIS — G8929 Other chronic pain: Secondary | ICD-10-CM

## 2013-09-11 MED ORDER — OXYCODONE HCL 20 MG PO TABS: 1.0000 | ORAL_TABLET | Freq: Three times a day (TID) | ORAL | Status: DC | PRN

## 2013-09-11 MED ORDER — OXYCODONE HCL ER 40 MG PO T12A: 40.0000 mg | EXTENDED_RELEASE_TABLET | Freq: Two times a day (BID) | ORAL | Status: DC

## 2013-09-11 NOTE — Telephone Encounter (Signed)
Pt called requesting a refill of his OxyContin & Oxycodone. Reviewed chart, last rx was on 08/08/13. Will route to Dr Marin Olp for approval & signature then place at the front desk for pick up.

## 2013-10-05 ENCOUNTER — Other Ambulatory Visit: Payer: Self-pay | Admitting: *Deleted

## 2013-10-05 DIAGNOSIS — M4620 Osteomyelitis of vertebra, site unspecified: Secondary | ICD-10-CM

## 2013-10-05 DIAGNOSIS — G8929 Other chronic pain: Secondary | ICD-10-CM

## 2013-10-05 MED ORDER — OXYCODONE HCL ER 40 MG PO T12A: 40.0000 mg | EXTENDED_RELEASE_TABLET | Freq: Two times a day (BID) | ORAL | Status: DC

## 2013-10-05 MED ORDER — OXYCODONE HCL 20 MG PO TABS: 1.0000 | ORAL_TABLET | Freq: Three times a day (TID) | ORAL | Status: DC | PRN

## 2013-10-05 NOTE — Telephone Encounter (Signed)
Pt called requesting a refill of his OxyContin & Oxycodone. Reviewed chart, last rx was on 09/11/13. Can pick up rxs today but won't be able to fill until 10/09/13. He verbalized understanding. Will route to Dr Marin Olp for approval & signature then place at the front desk for pick up.

## 2013-10-09 ENCOUNTER — Encounter: Payer: Self-pay | Admitting: Nurse Practitioner

## 2013-10-09 ENCOUNTER — Other Ambulatory Visit: Payer: Self-pay | Admitting: Nurse Practitioner

## 2013-10-09 NOTE — Progress Notes (Signed)
[facility] called stating pt is refilling rx 4 days early. Original rx was given on 09/11/13 and new rx was written on 10/09/13 to be refilled. Pt is given a 2 day lead way due to living out of town and the holidays. [facility] for pharmacy to refill per Dr. Marin Olp and [facility] verbalized understanding.

## 2013-10-10 ENCOUNTER — Encounter (HOSPITAL_BASED_OUTPATIENT_CLINIC_OR_DEPARTMENT_OTHER): Payer: 59 | Attending: General Surgery

## 2013-10-10 DIAGNOSIS — I872 Venous insufficiency (chronic) (peripheral): Secondary | ICD-10-CM | POA: Insufficient documentation

## 2013-10-10 DIAGNOSIS — L97809 Non-pressure chronic ulcer of other part of unspecified lower leg with unspecified severity: Secondary | ICD-10-CM | POA: Insufficient documentation

## 2013-10-10 DIAGNOSIS — I87309 Chronic venous hypertension (idiopathic) without complications of unspecified lower extremity: Secondary | ICD-10-CM | POA: Insufficient documentation

## 2013-10-19 ENCOUNTER — Encounter (HOSPITAL_BASED_OUTPATIENT_CLINIC_OR_DEPARTMENT_OTHER): Payer: 59 | Attending: General Surgery

## 2013-10-19 DIAGNOSIS — L97909 Non-pressure chronic ulcer of unspecified part of unspecified lower leg with unspecified severity: Secondary | ICD-10-CM | POA: Insufficient documentation

## 2013-10-19 DIAGNOSIS — I87319 Chronic venous hypertension (idiopathic) with ulcer of unspecified lower extremity: Secondary | ICD-10-CM | POA: Insufficient documentation

## 2013-11-02 ENCOUNTER — Other Ambulatory Visit: Payer: Self-pay | Admitting: *Deleted

## 2013-11-02 DIAGNOSIS — G8929 Other chronic pain: Secondary | ICD-10-CM

## 2013-11-02 DIAGNOSIS — M4620 Osteomyelitis of vertebra, site unspecified: Secondary | ICD-10-CM

## 2013-11-02 MED ORDER — OXYCODONE HCL 20 MG PO TABS: 1.0000 | ORAL_TABLET | Freq: Three times a day (TID) | ORAL | Status: DC | PRN

## 2013-11-02 MED ORDER — OXYCODONE HCL ER 40 MG PO T12A: 40.0000 mg | EXTENDED_RELEASE_TABLET | Freq: Two times a day (BID) | ORAL | Status: DC

## 2013-11-19 ENCOUNTER — Encounter (HOSPITAL_BASED_OUTPATIENT_CLINIC_OR_DEPARTMENT_OTHER): Payer: 59 | Attending: General Surgery

## 2013-11-19 DIAGNOSIS — I87319 Chronic venous hypertension (idiopathic) with ulcer of unspecified lower extremity: Secondary | ICD-10-CM | POA: Insufficient documentation

## 2013-11-19 DIAGNOSIS — L97909 Non-pressure chronic ulcer of unspecified part of unspecified lower leg with unspecified severity: Principal | ICD-10-CM | POA: Insufficient documentation

## 2013-11-19 DIAGNOSIS — I890 Lymphedema, not elsewhere classified: Secondary | ICD-10-CM | POA: Insufficient documentation

## 2013-12-03 ENCOUNTER — Other Ambulatory Visit: Payer: Self-pay | Admitting: Nurse Practitioner

## 2013-12-03 DIAGNOSIS — M549 Dorsalgia, unspecified: Principal | ICD-10-CM

## 2013-12-03 DIAGNOSIS — M4620 Osteomyelitis of vertebra, site unspecified: Secondary | ICD-10-CM

## 2013-12-03 DIAGNOSIS — G8929 Other chronic pain: Secondary | ICD-10-CM

## 2013-12-03 MED ORDER — OXYCODONE HCL 20 MG PO TABS: 1.0000 | ORAL_TABLET | Freq: Three times a day (TID) | ORAL | Status: DC | PRN

## 2013-12-03 MED ORDER — OXYCODONE HCL ER 40 MG PO T12A: 40.0000 mg | EXTENDED_RELEASE_TABLET | Freq: Two times a day (BID) | ORAL | Status: DC

## 2013-12-24 ENCOUNTER — Encounter (HOSPITAL_BASED_OUTPATIENT_CLINIC_OR_DEPARTMENT_OTHER): Payer: 59 | Attending: General Surgery

## 2013-12-24 DIAGNOSIS — I890 Lymphedema, not elsewhere classified: Secondary | ICD-10-CM | POA: Insufficient documentation

## 2013-12-24 DIAGNOSIS — L97809 Non-pressure chronic ulcer of other part of unspecified lower leg with unspecified severity: Secondary | ICD-10-CM | POA: Insufficient documentation

## 2013-12-27 ENCOUNTER — Other Ambulatory Visit: Payer: 59 | Admitting: Lab

## 2013-12-27 ENCOUNTER — Ambulatory Visit: Payer: 59 | Admitting: Hematology & Oncology

## 2013-12-28 ENCOUNTER — Telehealth: Payer: Self-pay | Admitting: Hematology & Oncology

## 2013-12-28 NOTE — Telephone Encounter (Signed)
Pt called to reschedule 2-12 no show to 3-12. He wanted to be seen sooner, this was the first one I had. He didn't want to talk to RN for work in

## 2013-12-31 ENCOUNTER — Other Ambulatory Visit: Payer: Self-pay | Admitting: Nurse Practitioner

## 2013-12-31 DIAGNOSIS — M4620 Osteomyelitis of vertebra, site unspecified: Secondary | ICD-10-CM

## 2013-12-31 DIAGNOSIS — M549 Dorsalgia, unspecified: Principal | ICD-10-CM

## 2013-12-31 DIAGNOSIS — G8929 Other chronic pain: Secondary | ICD-10-CM

## 2013-12-31 MED ORDER — OXYCODONE HCL ER 40 MG PO T12A: 40.0000 mg | EXTENDED_RELEASE_TABLET | Freq: Two times a day (BID) | ORAL | Status: DC

## 2013-12-31 MED ORDER — OXYCODONE HCL 20 MG PO TABS: 1.0000 | ORAL_TABLET | Freq: Three times a day (TID) | ORAL | Status: DC | PRN

## 2014-01-14 ENCOUNTER — Encounter (HOSPITAL_BASED_OUTPATIENT_CLINIC_OR_DEPARTMENT_OTHER): Payer: 59 | Attending: General Surgery

## 2014-01-14 ENCOUNTER — Telehealth (HOSPITAL_COMMUNITY): Payer: Self-pay | Admitting: *Deleted

## 2014-01-14 DIAGNOSIS — I890 Lymphedema, not elsewhere classified: Secondary | ICD-10-CM | POA: Insufficient documentation

## 2014-01-14 DIAGNOSIS — I872 Venous insufficiency (chronic) (peripheral): Secondary | ICD-10-CM | POA: Insufficient documentation

## 2014-01-14 DIAGNOSIS — Z87898 Personal history of other specified conditions: Secondary | ICD-10-CM | POA: Insufficient documentation

## 2014-01-14 DIAGNOSIS — L97809 Non-pressure chronic ulcer of other part of unspecified lower leg with unspecified severity: Secondary | ICD-10-CM | POA: Insufficient documentation

## 2014-01-15 NOTE — Progress Notes (Signed)
[facility]  NAME:  Albert James, Albert James NO.:  MEDICAL RECORD NO.:  [id number]      DATE OF BIRTH:  07-19-1956  PHYSICIAN:  Irene Limbo, MD    VISIT DATE:  01/14/2014                                  OFFICE VISIT   CHIEF COMPLAINT:  Followup of left lower extremity lymphedema, ulcers.  HISTORY OF PRESENT ILLNESS:  The patient is a 58 year old male with history of morbid obesity and history of previous lymphoma that presents with recurrent ulcerations of his left lower extremity.  The patient has previously healed these wounds before but noted then to be recurred soon after he returned to compression.  He does have a lymphedema pump which he uses at least twice daily.  Current wound care has been layered compression wraps.  PHYSICAL EXAMINATION:  The patient has 2 open wounds remaining measuring 3.1 x 1.7 x 0.1 over the anterior surface.  The left lateral wound has now healed and the left posterior wound is 0.5 x 3 x 0.1 cm measured as cluster.  After application of topical anesthetics, elective debridement was performed with a curette to remove all of the superficial slough.  The patient tolerated the procedure well.  We will continue with collagen to wound bed followed by layered compression wraps.  The patient was referred for ultrasound, duplex, as well as for reflex a month ago and this has not been completed.  We will follow up on this.  We did discuss with the patient bariatric surgery.  He said he had this discussion previously and was evaluated and felt not to be a candidate due to his history of liver failure.  Followup in 1 week's time.          ______________________________ Irene Limbo, MD     BT/MEDQ  D:  01/14/2014  T:  01/15/2014  Job:  [id number]

## 2014-01-22 NOTE — Progress Notes (Signed)
[facility]  NAME:  Albert James, Albert James NO.:  [id number]  MEDICAL RECORD NO.:  [id number]      DATE OF BIRTH:  [birth year]  PHYSICIAN:  Irene Limbo, MD    VISIT DATE:  01/21/2014                                  OFFICE VISIT   CHIEF COMPLAINT:  Left lower extremity ulceration in the setting of lymphedema and venous stasis.  HISTORY OF PRESENT ILLNESS:  The patient is a 58 year old ambulatory male with history of obesity and recurrent ulceration over the left lower extremity, who presents for followup of his lower extremity wound. Current wound care is collagen with layered compression wraps.  PHYSICAL EXAMINATION:  VITAL SIGNS:  Blood pressure is 125/90, pulse 90, temperature is 97.8.  Left anterior lower extremity venous wound is measured as 2.2 x 2.1 x 0.1 cm.  This wound is completely granulated with some hypertrophic tissue and with no slough present.  No debridement was performed here.  The left posterior leg ulceration is measured as a cluster, measured at 0.5 x 5 cm x 0.1 cm.  After application of topical anesthetic, a curette was used to remove superficial slough over wound. The patient tolerated the procedure well.  ASSESSMENT AND PLAN:  We continue with collagen and layered compression wraps.  Patient has his own compression in place over the right lower extremity.  Plan for followup in 1 week's time.          ______________________________ Irene Limbo, MD     BT/MEDQ  D:  01/21/2014  T:  01/22/2014  Job:  [id number]

## 2014-01-23 ENCOUNTER — Other Ambulatory Visit: Payer: Self-pay | Admitting: *Deleted

## 2014-01-23 DIAGNOSIS — Z8572 Personal history of non-Hodgkin lymphomas: Secondary | ICD-10-CM

## 2014-01-24 ENCOUNTER — Ambulatory Visit (HOSPITAL_BASED_OUTPATIENT_CLINIC_OR_DEPARTMENT_OTHER): Payer: 59 | Admitting: Hematology & Oncology

## 2014-01-24 ENCOUNTER — Other Ambulatory Visit (HOSPITAL_BASED_OUTPATIENT_CLINIC_OR_DEPARTMENT_OTHER): Payer: 59 | Admitting: Lab

## 2014-01-24 ENCOUNTER — Encounter: Payer: Self-pay | Admitting: *Deleted

## 2014-01-24 ENCOUNTER — Encounter: Payer: Self-pay | Admitting: Hematology & Oncology

## 2014-01-24 VITALS — BP 123/73 | HR 57 | Temp 98.0°F | Resp 20 | Ht 70.0 in | Wt >= 6400 oz

## 2014-01-24 DIAGNOSIS — I890 Lymphedema, not elsewhere classified: Secondary | ICD-10-CM

## 2014-01-24 DIAGNOSIS — Z87898 Personal history of other specified conditions: Secondary | ICD-10-CM

## 2014-01-24 DIAGNOSIS — M549 Dorsalgia, unspecified: Principal | ICD-10-CM

## 2014-01-24 DIAGNOSIS — Z8572 Personal history of non-Hodgkin lymphomas: Secondary | ICD-10-CM

## 2014-01-24 DIAGNOSIS — G8929 Other chronic pain: Secondary | ICD-10-CM

## 2014-01-24 DIAGNOSIS — M4620 Osteomyelitis of vertebra, site unspecified: Secondary | ICD-10-CM

## 2014-01-24 DIAGNOSIS — M869 Osteomyelitis, unspecified: Secondary | ICD-10-CM

## 2014-01-24 LAB — COMPREHENSIVE METABOLIC PANEL
ALBUMIN: 3.7 g/dL (ref 3.5–5.2)
ALK PHOS: 76 U/L (ref 39–117)
ALT: 15 U/L (ref 0–53)
AST: 11 U/L (ref 0–37)
BUN: 16 mg/dL (ref 6–23)
CALCIUM: 8.4 mg/dL (ref 8.4–10.5)
CHLORIDE: 102 meq/L (ref 96–112)
CO2: 31 mEq/L (ref 19–32)
CREATININE: 0.96 mg/dL (ref 0.50–1.35)
Glucose, Bld: 114 mg/dL — ABNORMAL HIGH (ref 70–99)
POTASSIUM: 4.1 meq/L (ref 3.5–5.3)
Sodium: 141 mEq/L (ref 135–145)
Total Bilirubin: 0.7 mg/dL (ref 0.2–1.2)
Total Protein: 5.6 g/dL — ABNORMAL LOW (ref 6.0–8.3)

## 2014-01-24 LAB — CBC WITH DIFFERENTIAL (CANCER CENTER ONLY)
BASO#: 0 10*3/uL (ref 0.0–0.2)
BASO%: 0.3 % (ref 0.0–2.0)
EOS%: 3.1 % (ref 0.0–7.0)
Eosinophils Absolute: 0.2 10*3/uL (ref 0.0–0.5)
HCT: 43 % (ref 38.7–49.9)
HGB: 13.9 g/dL (ref 13.0–17.1)
LYMPH#: 2 10*3/uL (ref 0.9–3.3)
LYMPH%: 30.4 % (ref 14.0–48.0)
MCH: 30.5 pg (ref 28.0–33.4)
MCHC: 32.3 g/dL (ref 32.0–35.9)
MCV: 95 fL (ref 82–98)
MONO#: 0.6 10*3/uL (ref 0.1–0.9)
MONO%: 8.8 % (ref 0.0–13.0)
NEUT#: 3.7 10*3/uL (ref 1.5–6.5)
NEUT%: 57.4 % (ref 40.0–80.0)
Platelets: 114 10*3/uL — ABNORMAL LOW (ref 145–400)
RBC: 4.55 10*6/uL (ref 4.20–5.70)
RDW: 15.1 % (ref 11.1–15.7)
WBC: 6.5 10*3/uL (ref 4.0–10.0)

## 2014-01-24 MED ORDER — OXYCODONE HCL 20 MG PO TABS: 1.0000 | ORAL_TABLET | Freq: Three times a day (TID) | ORAL | Status: DC | PRN

## 2014-01-24 MED ORDER — OXYCODONE HCL ER 40 MG PO T12A: 40.0000 mg | EXTENDED_RELEASE_TABLET | Freq: Two times a day (BID) | ORAL | Status: DC

## 2014-01-24 NOTE — Progress Notes (Signed)
  DIAGNOSIS: 1. Follicular small cell non-Hodgkin lymphoma, clinical remission. 2. Morbid obesity. 3. Lymphedema of his legs. 4. Vertebral osteomyelitis.   CURRENT THERAPY: Observation.   INTERIM HISTORY:  Albert James comes in for followup. We see him every 6 months. He is going to have to retire. This will be because of disability. He has severe osteo-arthritis. It osteo-myelitis in the spine. This caused severe deterioration of his spine. He has chronic pain. He is on OxyContin and oxycodone.    He has worsening lymphedema in his legs. He sees the clinic for this. He has a compression device now to try to help. His left leg is weeping. He has a wound that is difficult to heal.    He has a very hard time walking now. He uses a cane. He does not able to work anymore. He used to do fairly heavy labor. He just cannot do this. As such, he is going out on disability which I totally understand.   He's had no problems with his lymphoma. He, in essence, has been in remission now for close to 9 years.  Is a no problems with his liver. He's had no ascites. He's had no problems with cough.  PHYSICAL EXAMINATION:  Morbidly obese white gentleman. His vital signs show temperature of 98. Pulse 57. Blood pressure 123/73. Weight is 463 pounds. Head exam shows no ocular or oral lesions. There is no adenopathy in the neck. Lungs are clear. He has a slight decrease at the bases. Cardiac exam regular in rhythm with no murmurs rubs or bruits. Abdomen is morbidly obese. She has good bowel sounds. I cannot palpate any obvious liver or spleen tip. Extremities shows massive lymphedema in the legs. His right leg is probably worse than his left leg. Exam shows tenderness in the mid lower spine. Skin shows a lymphedema changes in his legs.   LABORATORY STUDIES:  See EMR   IMPRESSION: Albert James is a 58 year old gentleman. He had follicular small cell lymphoma. He was treated. He is in remission. He  has been in remission now for 9 years. I think if he did recur, this would be very unusual.  His prognosis is probably related to his obesity and 2 this lymphedema. He is incredibly high risk for cellulitis. I think if he got cellulitis, it would be very difficult for this to be treated and won't have to worry about the possibility of surgical intervention.  I'll plan to see him back myself in another 6 months. I do not see need for any scans.  We will do disability forms for him.  Volanda Napoleon, MD 01/24/2014

## 2014-01-29 ENCOUNTER — Telehealth (HOSPITAL_COMMUNITY): Payer: Self-pay | Admitting: *Deleted

## 2014-01-29 NOTE — Progress Notes (Signed)
[facility]  NAME:  Albert, James NO.:  [id number]  MEDICAL RECORD NO.:  [id number]      DATE OF BIRTH:  Oct 23, 1956  PHYSICIAN:  Irene Limbo, MD    VISIT DATE:  01/28/2014                                  OFFICE VISIT   CHIEF COMPLAINT:  Lower extremity ulceration in the setting of lymphedema, venous stasis, and morbid obesity.  HISTORY OF PRESENT ILLNESS:  The patient is here for followup of left lower extremity ulceration.  At his last visit, he was placed in a layered compression wrap, and he states this lasted only about 3 days before it fell down.  He has returned to using his Juxta-Fit compression over the wound.  He would like to continue with layered compression wraps.  PHYSICAL EXAMINATION:  VITAL SIGNS:  Blood pressure is 159/80, pulse is 56, temperature is 98.4.  Left anterior lower extremity wound is measured as 1.9 x 1.7 x 0.1 cm. There is hypergranulation tissue present.  There is no slough present and the wound is completely granulated.  The left posterior lower extremity wound is measured as a cluster, measured as 0.9 x 1.5 x 0.1 cm.  This is also improved since last week, the most anterior portion of the cluster appears to have epithelialized.  No debridement was performed today.  Silver nitrate was applied to the hypergranulation tissue over the anterior lower extremity wound.  We will continue with collagen and layered compression wrap, and follow up in 1 week's time.          ______________________________ Irene Limbo, MD MBA     BT/MEDQ  D:  01/28/2014  T:  01/29/2014  Job:  [id number]

## 2014-02-05 NOTE — Progress Notes (Signed)
[facility]  NAME:  Albert James, Albert James NO.:  [id number]  MEDICAL RECORD NO.:  [id number]      DATE OF BIRTH:  [birth year]  PHYSICIAN:  Theodoro Kos, DO       VISIT DATE:  02/04/2014                                  OFFICE VISIT   [name] is here for followup after having a wrap placed on his left lower extremity for lymphedema and chronic venous insufficiency. He thinks that the wrap may have caused some irritation of his legs but it is not clear if he has been elevating it.  He has been using his compression but only twice a day.  PAST MEDICAL HISTORY:  Positive for knee surgery, appendectomy, Port-A- Cath placement, lymph node removal, arm surgery in the elbow, hernia repair as an infant.  He has neuropathy.  He has been treated with chemotherapy.  He has liver disease, abdominal hernia, back pain, vertebral osteomyelitis, lymphedema, morbid obesity, and small Hodgkin lymphoma.  MEDICATIONS:  No allergies.  He is on vitamin D, omega 3 fatty acids, Voltaren, Flonase, Lasix, Hycodan, lactulose, oxycodone, Aldactone, and Celebrex.  REVIEW OF SYSTEMS:  Negative.  No change in medications or social history.  PHYSICAL EXAMINATION:  GENERAL:  He is alert, oriented.  He is cooperative. HEENT:  His pupils are equal.  His breathing is unlabored. EXTREMITIES:  He is quite large and has significant swelling in his bilateral lower extremities.  A little bit of redness on his left lower extremity and he does have a superficial abrasion or wound.  It does look like contact irritation.  RECOMMENDATION:  For silver alginate, elevation, multivitamin, vitamin C, Vascular consult, pre-albumin check, protein intake to increase, compression 4 times a day, and continue with the Profore over the silver alginate.  He is in agreement for this.     Theodoro Kos, DO     CS/MEDQ  D:  02/04/2014  T:  02/05/2014  Job:  [id number]

## 2014-02-12 ENCOUNTER — Other Ambulatory Visit (HOSPITAL_COMMUNITY): Payer: Self-pay | Admitting: General Surgery

## 2014-02-12 DIAGNOSIS — I872 Venous insufficiency (chronic) (peripheral): Secondary | ICD-10-CM

## 2014-02-12 NOTE — Progress Notes (Signed)
[facility]  NAME:  Albert James, BELLANCA NO.:  MEDICAL RECORD NO.:  [id number]      DATE OF BIRTH:  02-10-1956  PHYSICIAN:  Irene Limbo, MD    VISIT DATE:  02/11/2014                                  OFFICE VISIT   CHIEF COMPLAINT:  Left lower extremity ulceration in the setting of lymphedema and venous insufficiency.  SUBJECTIVE:  He reports that 2 weeks ago, he broke out from what was applied to the wound, which was collagen, and it made the wound larger.  Over the last week, he has been on silver alginate and Profore.  He states he has tolerated this well.  The patient's history is significant for vertebral osteomyelitis, morbid obesity, Hodgkin lymphoma.  OBJECTIVE:  Blood pressure is 149/96, pulse is 79, temperature is 98.2. Left anterior lower extremity wound is measured at 4 x 6.4 x 0.1 cm. After application of topical anesthetic, curette was used to remove all the superficial slough for selective debridement.  The left posterior lower extremity wound is measured at 0.5 x 0.2 x 0.1 cm.  No debridement was performed.  The wound is clean without drainage and nearly healed.  Left calf measures 65 cm, left ankle is 38 cm.  He has Juxta Fit in place over his right lower extremity.  PLAN:  We will plan to continue with silver alginate and Profore wrap for the week and followup in 1 week's time.          ______________________________ Irene Limbo, MD MBA     BT/MEDQ  D:  02/11/2014  T:  02/12/2014  Job:  [id number]

## 2014-02-13 ENCOUNTER — Ambulatory Visit (HOSPITAL_COMMUNITY)
Admission: RE | Admit: 2014-02-13 | Discharge: 2014-02-13 | Disposition: A | Discharge status: Home / Self Care | Payer: 59 | Source: Ambulatory Visit | Attending: Cardiovascular Disease | Admitting: Cardiovascular Disease

## 2014-02-13 DIAGNOSIS — M7989 Other specified soft tissue disorders: Secondary | ICD-10-CM

## 2014-02-13 DIAGNOSIS — I872 Venous insufficiency (chronic) (peripheral): Secondary | ICD-10-CM | POA: Insufficient documentation

## 2014-02-13 NOTE — Progress Notes (Signed)
Venous Duplex Lower Ext. Completed. Preliminary results by tech - Negative for DVT in both legs, and positive for venous insufficiency bilaterally.  Oda Cogan, BS, RDMS, RVT

## 2014-02-14 ENCOUNTER — Encounter (HOSPITAL_BASED_OUTPATIENT_CLINIC_OR_DEPARTMENT_OTHER): Payer: 59 | Attending: Plastic Surgery

## 2014-02-14 DIAGNOSIS — Z8571 Personal history of Hodgkin lymphoma: Secondary | ICD-10-CM | POA: Insufficient documentation

## 2014-02-14 DIAGNOSIS — L97809 Non-pressure chronic ulcer of other part of unspecified lower leg with unspecified severity: Secondary | ICD-10-CM | POA: Insufficient documentation

## 2014-02-14 DIAGNOSIS — I872 Venous insufficiency (chronic) (peripheral): Secondary | ICD-10-CM | POA: Insufficient documentation

## 2014-02-14 DIAGNOSIS — I890 Lymphedema, not elsewhere classified: Secondary | ICD-10-CM | POA: Insufficient documentation

## 2014-02-19 NOTE — Progress Notes (Signed)
[facility]  NAME:  Albert James, Albert James NO.:  MEDICAL RECORD NO.:  [id number]      DATE OF BIRTH:  [birth year]  PHYSICIAN:  Irene Limbo, MD    VISIT DATE:  02/18/2014                                  OFFICE VISIT   CHIEF COMPLAINT:  Left lower extremity ulceration in the setting of lymphedema and venous insufficiency.  HISTORY OF PRESENT ILLNESS:  The patient is a 58 year old ambulatory male with past medical history significant for vertebral osteomyelitis, morbid obesity, and Hodgkin lymphoma.  His current wound care has been silver alginate and Profore wraps which she is tolerating well.  Since his last visit, he underwent lower extremity imaging which showed no evidence of DVT and venous insufficiency with reflux in the left greater saphenous vein.  This study was done on April 1st.  The patient reports that he has never been evaluated for venous ablation and he is agreeable to referral to Vascular Surgery for evaluation for this.  On examination, blood pressure is 118/74, pulse is 78, temperature is 98, left calf measures 53 cm, left ankle is 33 cm.  Left anterior lower extremity wound is measured as 3.5 x 5.7 x 0.1 cm.  Left posterior lower extremity wound appears to be nearly healed and is measured at 0.1 x 0.1 x 0.1 cm.  After application of local anesthetic, selective debridement was performed to the left anterior lower extremity wound over its entirety to remove all the superficial slough and exudate.  PLAN:  We will plan to continue with the current wound care and initiate referral to Vascular Surgery Service.          ______________________________ Irene Limbo, MD MBA     BT/MEDQ  D:  02/18/2014  T:  02/19/2014  Job:  [id number]

## 2014-02-26 NOTE — Progress Notes (Signed)
[facility]  NAME:  Albert James, Albert James NO.:  MEDICAL RECORD NO.:  [id number]      DATE OF BIRTH:  1956-07-23  PHYSICIAN:  Irene Limbo, MD    VISIT DATE:  02/25/2014                                  OFFICE VISIT   CHIEF COMPLAINT:  Left lower extremity ulceration in the setting of lymphedema and venous insufficiency.  The patient is a 58 year old ambulatory male, significant for vertebral osteomyelitis, morbid obesity, and Hodgkin lymphoma.  He was noted on recent venous ultrasound to have evidence of reflux and referral to vascular surgery has been sent.  He has an appointment with them tomorrow.  He notes that the Profore wraps are slipping and have significant pain starting yesterday.  EXAMINATION:  VITAL SIGNS:  Blood pressure is 131/82, pulse is 58, temperature is 98.1. EXTREMITIES:  The  left posterior lower extremity wound appears to be completely epithelialized today.  Left anterior lower extremity wound is measured as a cluster of 4 x 4.1 x 0.1 cm.  Left calf circumference is 65.5 cm.  Left ankle is 36 cm.  After application of topical anesthetic, curette was used to remove all the superficial slough over the entirety of the wound for selective debridement.  We will plan to continue with current wound care, silver alginate with Profore wrap.  I have instructed the patient that if repeat ultrasounds of the lower extremity required that they should cut his layered compression wrap off. After his vascular surgery consultation, he should let us know that he needs to be re-wrapped this week.  Plan for followup in 1 week's time.          ______________________________ Irene Limbo, MD     BT/MEDQ  D:  02/25/2014  T:  02/26/2014  Job:  [id number]

## 2014-03-04 ENCOUNTER — Other Ambulatory Visit: Payer: Self-pay

## 2014-03-04 DIAGNOSIS — G8929 Other chronic pain: Secondary | ICD-10-CM

## 2014-03-04 DIAGNOSIS — M4620 Osteomyelitis of vertebra, site unspecified: Secondary | ICD-10-CM

## 2014-03-04 DIAGNOSIS — M549 Dorsalgia, unspecified: Principal | ICD-10-CM

## 2014-03-04 MED ORDER — OXYCODONE HCL 20 MG PO TABS: 1.0000 | ORAL_TABLET | Freq: Three times a day (TID) | ORAL | Status: DC | PRN

## 2014-03-04 MED ORDER — OXYCODONE HCL ER 40 MG PO T12A: 40.0000 mg | EXTENDED_RELEASE_TABLET | Freq: Two times a day (BID) | ORAL | Status: DC

## 2014-03-05 ENCOUNTER — Telehealth: Payer: Self-pay | Admitting: Hematology & Oncology

## 2014-03-05 NOTE — Telephone Encounter (Signed)
Faxed [employer] disability papers today to: [phone]   Claim: [id number] Policy: [id number]  Restrictions: 01-24-2014-11-14-2014 Limitations:    01-24-2014-11-14-2014    COPY SCANNED

## 2014-03-05 NOTE — Progress Notes (Signed)
[facility]  NAME:  Albert James, Albert James NO.:  MEDICAL RECORD NO.:  [id number]      DATE OF BIRTH:  [birth year]  PHYSICIAN:  Irene Limbo, MD    VISIT DATE:  03/04/2014                                  OFFICE VISIT   CHIEF COMPLAINT:  Left lower extremity ulceration in the setting of lymphedema and venous insufficiency.  HISTORY OF PRESENT ILLNESS:  The patient is a 58 year old ambulatory male with history significant for vertebral osteomyelitis,  morbid obesity and Hodgkin lymphoma.  He is known to have a reflux evident on venous ultrasound and he is awaiting consultation with vascular surgery. He reports today that he was incorrect about the date of the appointment and it is not until May 2015 that he sees vascular surgery. His current wound care has been silver alginate with Profore wraps.  PHYSICAL EXAMINATION:  Blood pressure is 128/87, pulse is 80, temperature is 98.  Left calf measures 56 cm.  Left ankle is 33 cm.  He has again had problems with the wrap coming down over his leg and he wrapped an additional Ace wrap on top of his layered compression wrap. Overall, his calf and ankle circumferences are significantly improved since last week.  Open wound over left anterior leg is measured at 1.2 x 1.5 x 0.1 cm.  After application of topical anesthetic, selective debridement was performed with a curette to remove all the superficial slough over the entirety of the wound.  Silver nitrate was used to control bleeding and to treat an area of hypergranulation tissue.  PLAN:  We will plan to continue with the current wound care and follow up in 1 week's time.  We discussed that if the patient is able to complete his own wound care, we can consider moving to Juxta-Fit compression next week as he had significant contraction of his wound this week.          ______________________________ Irene Limbo, MD MBA     BT/MEDQ  D:   03/04/2014  T:  03/05/2014  Job:  [id number]

## 2014-03-12 NOTE — Progress Notes (Signed)
[facility]  NAME:  Albert James, Albert James NO.:  [id number]  MEDICAL RECORD NO.:  [id number]      DATE OF BIRTH:  12-Apr-1956  PHYSICIAN:  Irene Limbo, MD    VISIT DATE:  03/11/2014                                  OFFICE VISIT   CHIEF COMPLAINT:  Left lower extremity ulceration in the setting of lymphedema and venous insufficiency.  HISTORY OF PRESENT ILLNESS:  The patient is a 58 year old ambulatory male with history significant for vertebral osteomyelitis, obesity, and Hodgkin's lymphoma, who is here for followup of left lower extremity ulcerations.  His current wound care has been layered compression wrap with silver alginate.  He has Vascular Surgery consultation in May of 2015, for evidence of reflux on his recent duplex.  PHYSICAL EXAMINATION:  Left calf measures 57 cm, left ankle is 34 cm. He notes again he has had problems with the layered wrap coming down over the course of the week.  However, we have been fortunate not to suffer any secondary ulceration.  Open wound over his left anterior leg is measured as 1 x 1 x 0.1 cm.  No debridement was performed today.  The wound is completely granulated with no slough pleasant.  PLAN:  Continued improvement in the size of this wound.  Offered to continue with layer compression wrap versus switch to Juxta-lite.  We will go ahead and try the Juxta-lite this week.  We will switch to Prisma with Allevyn foam over the wound and recheck in 1 week's time.          ______________________________ Irene Limbo, MD MBA     BT/MEDQ  D:  03/11/2014  T:  03/12/2014  Job:  [id number]

## 2014-03-14 ENCOUNTER — Telehealth: Payer: Self-pay | Admitting: Hematology & Oncology

## 2014-03-14 NOTE — Telephone Encounter (Signed)
Faxed Medical Records via fax today to:  [facility] Ph: [phone] Fx: [phone]   Medical  Records requested from ALL to present   [place] SCANNED

## 2014-03-18 ENCOUNTER — Encounter (HOSPITAL_BASED_OUTPATIENT_CLINIC_OR_DEPARTMENT_OTHER): Payer: 59 | Attending: Plastic Surgery

## 2014-03-18 DIAGNOSIS — Z87898 Personal history of other specified conditions: Secondary | ICD-10-CM | POA: Insufficient documentation

## 2014-03-18 DIAGNOSIS — L97809 Non-pressure chronic ulcer of other part of unspecified lower leg with unspecified severity: Secondary | ICD-10-CM | POA: Insufficient documentation

## 2014-03-18 DIAGNOSIS — E669 Obesity, unspecified: Secondary | ICD-10-CM | POA: Insufficient documentation

## 2014-03-18 DIAGNOSIS — I890 Lymphedema, not elsewhere classified: Secondary | ICD-10-CM | POA: Insufficient documentation

## 2014-03-18 DIAGNOSIS — I872 Venous insufficiency (chronic) (peripheral): Secondary | ICD-10-CM | POA: Insufficient documentation

## 2014-03-19 NOTE — Progress Notes (Signed)
[facility]  NAME:  Albert James, Albert James NO.:  [id number]  MEDICAL RECORD NO.:  [id number]      DATE OF BIRTH:  September 03, 1956  PHYSICIAN:  Irene Limbo, MD    VISIT DATE:  03/18/2014                                  OFFICE VISIT   CHIEF COMPLAINT:  Left lower extremity ulceration in the setting of lymphedema and venous insufficiency.  HISTORY OF PRESENT ILLNESS:  The patient is a 58 year old ambulatory male with medical history significant for vertebral osteomyelitis, obesity, and Hodgkin lymphoma, who is here for followup of left lower extremity ulceration.  At his last visit, he was switched to Juxta-Lite compression and silver collagen.  He has a Vascular Surgery consultation in May 2015, for evidence of reflux on his recent duplex.  PHYSICAL EXAMINATION:  Left calf is 57 cm, left ankle is 34 cm.  He does have some tenderness to palpation over his lateral ankle with no cellulitis.  Remaining open wound is measured at 0.7 x 0.7 x 0.1 cm.  No debridement was performed today.  The wound was completely granulated with no slough present.  PLAN:  Continued improvement in the size of the wound.  We will continue with his Juxta-Lite compression and silver collagen.  Instructed the patient not to use foam for his own dressing changes as there is evidence of threatened ulceration from pressure at foam edges. We will use Tielle today.  The patient may use nonsterile gauze if he has this available at home.  He is completing his own dressing changes mid week and states he had no difficulty changing the dressing over the past week.  Plan for followup in 2 week's time.          ______________________________ Irene Limbo, MD MBA     BT/MEDQ  D:  03/18/2014  T:  03/19/2014  Job:  [id number]

## 2014-03-22 ENCOUNTER — Telehealth: Payer: Self-pay | Admitting: Hematology & Oncology

## 2014-03-22 NOTE — Telephone Encounter (Signed)
Faxed [employer] disability papers today again to: [phone]  Claim: [id number]  Policy: [id number]   P: [phone] - Hannah Howard and Marilu Favre   Second fax attempt    COPY SCANNED

## 2014-03-26 ENCOUNTER — Encounter: Payer: Self-pay | Admitting: Vascular Surgery

## 2014-03-27 ENCOUNTER — Encounter: Payer: Self-pay | Admitting: Vascular Surgery

## 2014-03-27 ENCOUNTER — Ambulatory Visit (INDEPENDENT_AMBULATORY_CARE_PROVIDER_SITE_OTHER): Payer: 59 | Admitting: Vascular Surgery

## 2014-03-27 VITALS — BP 110/75 | HR 88 | Ht 70.0 in | Wt >= 6400 oz

## 2014-03-27 DIAGNOSIS — L97909 Non-pressure chronic ulcer of unspecified part of unspecified lower leg with unspecified severity: Secondary | ICD-10-CM

## 2014-03-27 DIAGNOSIS — I872 Venous insufficiency (chronic) (peripheral): Secondary | ICD-10-CM | POA: Insufficient documentation

## 2014-03-27 NOTE — Progress Notes (Signed)
[facility]  Patient name: Albert James MRN: [id number] DOB: [birth year] Sex: male  REASON FOR CONSULT: Recurrent left leg venous stasis ulcer. Referred from the wound care center.  HPI: Albert James is a 58 y.o. male who is had a recurrent ulcer on the anterolateral aspect of his left leg. He has been treated at the wound care center in the wound has now healed. However, this is the third time that he has developed a wound on the left leg. He was sent for evaluation of venous reflux.  Patient denies any previous history of DVT or phlebitis. I do not get any history of claudication although his activity is really limited by arthritic pain in his knees. I do not get any clear-cut history of rest pain.  He does have a pneumatic compression device for his lower extremities which he uses. He also wears his compression garments faithfully. He states that without the compression garments his leg swelled significantly. He has never been able to be fitted for stockings correctly so he wears a self adjusting garment.  He denies any history of diabetes, hypertension or heart disease.   Past Medical History  Diagnosis Date  . nhl; dx'd 2006    chemo comp   Family History  Problem Relation Age of Onset  . Cancer Father    SOCIAL HISTORY: History  Substance Use Topics  . Smoking status: Former Smoker -- 0.50 packs/day for 8 years    Types: Cigarettes    Start date: 09/26/1966    Quit date: 06/26/1989  . Smokeless tobacco: Never Used     Comment: quit 38 years ago  . Alcohol Use: No   No Known Allergies Current Outpatient Prescriptions  Medication Sig Dispense Refill  . Cholecalciferol (VITAMIN D PO) Take by mouth 2 (two) times daily.      . Diclofenac Sodium 1.5 % SOLN Place onto the skin as needed.      . etodolac (LODINE) 400 MG tablet BID times 48H.      . fluticasone (FLONASE) 50 MCG/ACT nasal spray Place 1 spray into the nose as needed.        . furosemide (LASIX) 40 MG tablet Take 40 mg by mouth 2 (two) times daily.        Marland Kitchen HYDROcodone-homatropine (HYCODAN) 5-1.5 MG/5ML syrup Take 5 mLs by mouth as needed.       . lactulose (CHRONULAC) 10 GM/15ML solution Take by mouth as needed.       . nadolol (CORGARD) 20 MG tablet Take 20 mg by mouth daily.        . Omega-3 Fatty Acids (FISH OIL) 1000 MG CAPS Take by mouth 2 (two) times daily.      . OxyCODONE (OXYCONTIN) 40 mg T12A 12 hr tablet Take 1 tablet (40 mg total) by mouth every 12 (twelve) hours. May fill on 02/02/14.  60 tablet  0  . Oxycodone HCl 20 MG TABS Take 1 tablet (20 mg total) by mouth every 8 (eight) hours as needed. May fill on 02/02/2013  90 each  0  . spironolactone (ALDACTONE) 100 MG tablet Take 100 mg by mouth 2 (two) times daily.         No current facility-administered medications for this visit.   REVIEW OF SYSTEMS: Valu.Nieves ] denotes positive finding; [  ] denotes negative finding  CARDIOVASCULAR:  [ ]  chest pain   [ ]  chest pressure   [ ]  palpitations   [ ]   orthopnea   Valu.Nieves ] dyspnea on exertion   [ X] claudication   [ ]  rest pain   [ ]  DVT   [ ]  phlebitis PULMONARY:   [ ]  productive cough   [ ]  asthma   [ ]  wheezing NEUROLOGIC:   Valu.Nieves ] weakness  Valu.Nieves ] paresthesias  [ ]  aphasia  [ ]  amaurosis  [ ]  dizziness HEMATOLOGIC:   [ ]  bleeding problems   [ ]  clotting disorders MUSCULOSKELETAL:  [ ]  joint pain   [ ]  joint swelling Valu.Nieves ] leg swelling GASTROINTESTINAL: [ ]   blood in stool  [ ]   hematemesis GENITOURINARY:  [ ]   dysuria  [ ]   hematuria PSYCHIATRIC:  [ ]  history of major depression INTEGUMENTARY:  [ ]  rashes  [ ]  ulcers CONSTITUTIONAL:  [ ]  fever   [ ]  chills  PHYSICAL EXAM: Filed Vitals:   03/27/14 0957  BP: 110/75  Pulse: 88  Height: 5' 10"  (1.778 m)  Weight: 465 lb (210.923 kg)  SpO2: 98%   Body mass index is 66.72 kg/(m^2). GENERAL: The patient is a well-nourished male, in no acute distress. The vital signs are documented above. CARDIOVASCULAR: There is  a regular rate and rhythm. I do not detect carotid bruits. Because of his body habitus, I cannot palpate femoral pulses. I cannot palpate pedal pulses. He has significant bilateral lower extremity swelling up to his groins. He has a biphasic left dorsalis pedis signal and a monophasic right dorsalis pedis signal. I cannot obtain a posterior tibial signal because of the swelling. PULMONARY: There is good air exchange bilaterally without wheezing or rales. ABDOMEN: Soft and non-tender with normal pitched bowel sounds.  MUSCULOSKELETAL: There are no major deformities or cyanosis. NEUROLOGIC: No focal weakness or paresthesias are detected. SKIN: Currently there are no open ulcers. He has hyperpigmentation bilaterally. PSYCHIATRIC: The patient has a normal affect.  DATA:  I have reviewed his records from the wound care center.  In addition I have reviewed his venous duplex scan done by [facility] at [facility]. He had no evidence of DVT of either lower extremity. He did have some deep vein reflux and femoral veins bilaterally. There was no right greater saphenous vein reflux but there was significant left greater saphenous vein reflux. Diameters of the saphenous vein ranged from 0.77-1.1 cm.  MEDICAL ISSUES:  Chronic venous insufficiency This patient has had a recurrent venous ulcer of his left leg. This is the third time this wound has now healed. He does have evidence of significant chronic venous insufficiency on the left involving the deep femoral vein and also the greater saphenous vein. Given the recurrent ulceration I think he would be a good candidate for laser ablation of the left greater saphenous vein. I will arrange for him to see Dr. Donnetta Hutching or Dr. Kellie Simmering to discuss this option. In addition, I will obtain a formal arterial Doppler study when he returns for that visit given that he has a monophasic dorsalis pedis signal on the right and is difficult to assess his posterior tibial  arteries because of his leg swelling. We have discussed the importance of intermittent leg elevation. Does have compression garments. I've encouraged him to avoid prolonged sitting standing and encouraged him to remain as active as possible. He would continue follow up with the wound care center as needed if he develops recurrent ulceration.   Angelia Mould [facility] [place] Beeper: [phone]

## 2014-03-27 NOTE — Assessment & Plan Note (Signed)
This patient has had a recurrent venous ulcer of his left leg. This is the third time this wound has now healed. He does have evidence of significant chronic venous insufficiency on the left involving the deep femoral vein and also the greater saphenous vein. Given the recurrent ulceration I think he would be a good candidate for laser ablation of the left greater saphenous vein. I will arrange for him to see Dr. Donnetta Hutching or Dr. Kellie Simmering to discuss this option. In addition, I will obtain a formal arterial Doppler study when he returns for that visit given that he has a monophasic dorsalis pedis signal on the right and is difficult to assess his posterior tibial arteries because of his leg swelling. We have discussed the importance of intermittent leg elevation. Does have compression garments. I've encouraged him to avoid prolonged sitting standing and encouraged him to remain as active as possible. He would continue follow up with the wound care center as needed if he develops recurrent ulceration.

## 2014-03-27 NOTE — Addendum Note (Signed)
Addended by: Mena Goes on: 03/27/2014 10:52 AM   Modules accepted: Orders

## 2014-04-03 ENCOUNTER — Other Ambulatory Visit: Payer: Self-pay | Admitting: *Deleted

## 2014-04-03 DIAGNOSIS — M549 Dorsalgia, unspecified: Principal | ICD-10-CM

## 2014-04-03 DIAGNOSIS — G8929 Other chronic pain: Secondary | ICD-10-CM

## 2014-04-03 DIAGNOSIS — M4620 Osteomyelitis of vertebra, site unspecified: Secondary | ICD-10-CM

## 2014-04-03 MED ORDER — OXYCODONE HCL ER 40 MG PO T12A: 40.0000 mg | EXTENDED_RELEASE_TABLET | Freq: Two times a day (BID) | ORAL | Status: DC

## 2014-04-03 MED ORDER — OXYCODONE HCL 20 MG PO TABS: 1.0000 | ORAL_TABLET | Freq: Three times a day (TID) | ORAL | Status: DC | PRN

## 2014-04-29 ENCOUNTER — Other Ambulatory Visit: Payer: Self-pay | Admitting: *Deleted

## 2014-04-29 ENCOUNTER — Encounter: Payer: Self-pay | Admitting: Vascular Surgery

## 2014-04-29 DIAGNOSIS — M549 Dorsalgia, unspecified: Principal | ICD-10-CM

## 2014-04-29 DIAGNOSIS — M4620 Osteomyelitis of vertebra, site unspecified: Secondary | ICD-10-CM

## 2014-04-29 DIAGNOSIS — G8929 Other chronic pain: Secondary | ICD-10-CM

## 2014-04-29 MED ORDER — OXYCODONE HCL 20 MG PO TABS: 1.0000 | ORAL_TABLET | Freq: Three times a day (TID) | ORAL | Status: DC | PRN

## 2014-04-29 MED ORDER — OXYCODONE HCL ER 40 MG PO T12A
40.0000 mg | EXTENDED_RELEASE_TABLET | Freq: Two times a day (BID) | ORAL | Status: DC
Start: 2014-04-29 — End: 2014-06-04

## 2014-04-30 ENCOUNTER — Ambulatory Visit (INDEPENDENT_AMBULATORY_CARE_PROVIDER_SITE_OTHER): Payer: 59 | Admitting: Vascular Surgery

## 2014-04-30 ENCOUNTER — Encounter: Payer: Self-pay | Admitting: Vascular Surgery

## 2014-04-30 ENCOUNTER — Ambulatory Visit (HOSPITAL_COMMUNITY)
Admission: RE | Admit: 2014-04-30 | Discharge: 2014-04-30 | Disposition: A | Discharge status: Home / Self Care | Payer: 59 | Source: Ambulatory Visit | Attending: Vascular Surgery | Admitting: Vascular Surgery

## 2014-04-30 VITALS — BP 104/75 | HR 63 | Resp 20 | Ht 71.0 in | Wt >= 6400 oz

## 2014-04-30 DIAGNOSIS — L97909 Non-pressure chronic ulcer of unspecified part of unspecified lower leg with unspecified severity: Secondary | ICD-10-CM

## 2014-04-30 DIAGNOSIS — I872 Venous insufficiency (chronic) (peripheral): Secondary | ICD-10-CM

## 2014-04-30 DIAGNOSIS — I83893 Varicose veins of bilateral lower extremities with other complications: Secondary | ICD-10-CM

## 2014-04-30 NOTE — Progress Notes (Signed)
The patient is here today for continued discussion regarding venous ulcerations left leg. He has a chronic changes bilaterally. This is both venous and lymphatic. He does have a history of lymphoma. He has had a chronic venous ulcer over his left anterior ankle for over a year.  I did review his noninvasive studies with him. He did undergo normal noninvasive arterial studies today to ensure that he did not have any evidence of arterial insufficiency and he does not. These show normal triphasic waveforms bilaterally. He does have reflux in his left great saphenous vein and his deep venous system is reflux in the common femoral vein on the  He does have changes of chronic lymphedema bilaterally and the superficial ulceration or the anterior portion of his left lateral pretibial area.  Impression and plan: Chronic venous stasis ulceration with a failed conservative treatment. I recommended ablation of his great saphenous vein. He understands this is an outpatient procedure under local anesthesia. He is morbidly obese at 460 pounds. This will make the procedure somewhat more challenging he understands this as well. Also understands a slight risk of DVT associated with the procedure

## 2014-05-13 ENCOUNTER — Other Ambulatory Visit: Payer: Self-pay | Admitting: *Deleted

## 2014-05-13 DIAGNOSIS — I83893 Varicose veins of bilateral lower extremities with other complications: Secondary | ICD-10-CM

## 2014-05-13 DIAGNOSIS — L97909 Non-pressure chronic ulcer of unspecified part of unspecified lower leg with unspecified severity: Secondary | ICD-10-CM

## 2014-05-14 ENCOUNTER — Telehealth: Payer: Self-pay | Admitting: Vascular Surgery

## 2014-05-14 NOTE — Telephone Encounter (Signed)
Message copied by Gena Fray on Tue May 14, 2014  1:24 PM ------      Message from: Norberto Sorenson D      Created: Mon May 13, 2014  4:07 PM      Regarding: scheduling       Please schedule [facility] on 06-18-2014 for post laser ablation duplex (left leg, order in EPIC) and VV FU with Dr. Donnetta Hutching.  Thanks!  ------

## 2014-06-04 ENCOUNTER — Other Ambulatory Visit: Payer: Self-pay | Admitting: Nurse Practitioner

## 2014-06-04 DIAGNOSIS — M549 Dorsalgia, unspecified: Principal | ICD-10-CM

## 2014-06-04 DIAGNOSIS — M4620 Osteomyelitis of vertebra, site unspecified: Secondary | ICD-10-CM

## 2014-06-04 DIAGNOSIS — G8929 Other chronic pain: Secondary | ICD-10-CM

## 2014-06-04 MED ORDER — OXYCODONE HCL ER 40 MG PO T12A: 40.0000 mg | EXTENDED_RELEASE_TABLET | Freq: Two times a day (BID) | ORAL | Status: DC

## 2014-06-04 MED ORDER — OXYCODONE HCL 20 MG PO TABS: 1.0000 | ORAL_TABLET | Freq: Three times a day (TID) | ORAL | Status: DC | PRN

## 2014-06-07 ENCOUNTER — Encounter: Payer: Self-pay | Admitting: Vascular Surgery

## 2014-06-10 ENCOUNTER — Ambulatory Visit (INDEPENDENT_AMBULATORY_CARE_PROVIDER_SITE_OTHER): Payer: 59 | Admitting: Vascular Surgery

## 2014-06-10 ENCOUNTER — Encounter: Payer: Self-pay | Admitting: Vascular Surgery

## 2014-06-10 VITALS — BP 103/63 | HR 57 | Resp 18 | Ht 71.0 in | Wt >= 6400 oz

## 2014-06-10 DIAGNOSIS — I83893 Varicose veins of bilateral lower extremities with other complications: Secondary | ICD-10-CM

## 2014-06-10 DIAGNOSIS — L97921 Non-pressure chronic ulcer of unspecified part of left lower leg limited to breakdown of skin: Secondary | ICD-10-CM

## 2014-06-10 DIAGNOSIS — L97909 Non-pressure chronic ulcer of unspecified part of unspecified lower leg with unspecified severity: Secondary | ICD-10-CM

## 2014-06-10 HISTORY — PX: ENDOVENOUS ABLATION SAPHENOUS VEIN W/ LASER: SUR449

## 2014-06-10 NOTE — Progress Notes (Signed)
   Laser Ablation Procedure      Date: 06/10/2014    Albert James DOB:[birth year]  Consent signed: Yes  Surgeon:T.F. Irais Mottram  Procedure: Laser Ablation: left Greater Saphenous Vein  BP 103/63  Pulse 57  Resp 18  Ht 5' 11"  (1.803 m)  Wt 466 lb (211.376 kg)  BMI 65.02 kg/m2  Start time: 9:00AM   End time: 10:15AM  Tumescent Anesthesia: 475 cc 0.9 % NaCl with 50 cc Lidocaine HCL with 1 % Epi and 15 cc 8.4% Na HCO3    Local Anesthesia: 4 cc Lidocaine HCL and NaHCO3 (ratio 2:1)  Continuous Mode: 15 Watts Total Energy 2122 Joules Total Time2:21       Patient tolerated procedure well: Yes  Notes: ABD pads and Ace wrap were applied to left upper thigh.  Albert James's personal calf compression wrap was applied to bilateral calves.    Description of Procedure:  After marking the course of the saphenous vein and the secondary varicosities in the standing position, the patient was placed on the operating table in the supine position, and the left leg was prepped and draped in sterile fashion. Local anesthetic was administered, and under ultrasound guidance the saphenous vein was accessed with a micro needle and guide wire; then the micro puncture sheath was placed. A guide wire was inserted to the saphenofemoral junction, followed by a 5 french sheath.  The position of the sheath and then the laser fiber below the junction was confirmed using the ultrasound and visualization of the aiming beam.  Tumescent anesthesia was administered along the course of the saphenous vein using ultrasound guidance. Protective laser glasses were placed on the patient, and the laser was fired at at 15 watt continuous mode.  For a total of 2122 joules.  A steri strip was applied to the puncture site.   ABD pads and Ace wrap bandages were applied to left upper thigh.    Ace wrap bandages were applied  at the top of the saphenofemoral junction.  Blood loss was less than 15 cc.  The patient ambulated out  of the operating room having tolerated the procedure well.

## 2014-06-11 ENCOUNTER — Telehealth: Payer: Self-pay | Admitting: *Deleted

## 2014-06-11 NOTE — Telephone Encounter (Signed)
    06/11/2014  Time: 10:00 AM   Patient Name: Albert James  Patient of: T.F. Early  Procedure:Laser Ablation left  Greater saphenous vein 06-10-2014  Reached patient at home and checked  His status  Yes    Comments/Actions Taken: [name] states no problems with swelling and minimal left leg discomfort relieved by Ibuprofen.  Reviewed all post laser ablation instructions with him and reminded him of post LA duplex and VV FU with Dr. Donnetta Hutching on 06-18-2014.      @SIGNATURE @

## 2014-06-17 ENCOUNTER — Encounter: Payer: Self-pay | Admitting: Vascular Surgery

## 2014-06-18 ENCOUNTER — Encounter: Payer: Self-pay | Admitting: Vascular Surgery

## 2014-06-18 ENCOUNTER — Ambulatory Visit (INDEPENDENT_AMBULATORY_CARE_PROVIDER_SITE_OTHER): Payer: 59 | Admitting: Vascular Surgery

## 2014-06-18 ENCOUNTER — Ambulatory Visit (HOSPITAL_COMMUNITY)
Admission: RE | Admit: 2014-06-18 | Discharge: 2014-06-18 | Disposition: A | Discharge status: Home / Self Care | Payer: 59 | Source: Ambulatory Visit | Attending: Vascular Surgery | Admitting: Vascular Surgery

## 2014-06-18 VITALS — BP 99/70 | HR 85 | Resp 20 | Ht 71.0 in | Wt >= 6400 oz

## 2014-06-18 DIAGNOSIS — I83893 Varicose veins of bilateral lower extremities with other complications: Secondary | ICD-10-CM | POA: Diagnosis not present

## 2014-06-18 DIAGNOSIS — L97909 Non-pressure chronic ulcer of unspecified part of unspecified lower leg with unspecified severity: Secondary | ICD-10-CM | POA: Insufficient documentation

## 2014-06-18 DIAGNOSIS — L97921 Non-pressure chronic ulcer of unspecified part of left lower leg limited to breakdown of skin: Secondary | ICD-10-CM

## 2014-06-18 NOTE — Progress Notes (Signed)
Here today for one week followup of his left great saphenous vein ablation. He did well with the procedure or some mild discomfort in his medial thigh associated with this.   On physical exam he does have some very mild bruising but no evidence of skin irritation associated with the ablation.  Venous duplex today shows closure of his great saphenous vein from the knee region below the saphenofemoral junction. No evidence of  Impression and plan successful ablation of his left great saphenous vein for venous hypertension. He does report that he feels there is less drainage from his venous ulcer. I did explain that critical importance of continued elevation and wrapping. I with his morbid obesity at 460 pounds I explained that he will have lifelong difficulty regarding swelling and ulceration but hopefully treatment of his saphenous superficial reflux will improve this. We will see him again on an as-needed basis

## 2014-06-29 ENCOUNTER — Ambulatory Visit: Payer: Self-pay | Admitting: Orthopedic Surgery

## 2014-06-29 NOTE — H&P (Signed)
Albert James is an 58 y.o. male.   Chief Complaint: left shoulder pain HPI: The patient is a 58 year old male who presents today for follow up of their shoulder. The patient is being followed for their left shoulder pain. They are 4 month(s) out from a fall. Symptoms reported today include: pain. Current treatment includes: relative rest, activity modification, NSAIDs and pain medications. The following medication has been used for pain control: Oxycodone (in pain management). The patient presents today following MRI.  Albert James follows up. He has full thickness tear in his rotator cuff tendon, tear of his biceps tendon retracted. AC degenerative disease. He has pain with any overhead activities. Difficulty using the arm. He is having difficulty using his rolling walker. He recently weighed himself at 460-465.  Past Medical History  Diagnosis Date  . Ulcer   . Varicose veins   . Arthritis     lower back  . Back pain   . Knee pain   . Shoulder pain   . nhl; dx'd 2006    chemo comp, lymphoma    Past Surgical History  Procedure Laterality Date  . Hernia repair    . Portacath placement  2000    placed and removed  . Appendectomy    . Endovenous ablation saphenous vein w/ laser Left 06-10-2014    EVLA  Left greater saphenous vein by Curt Jews MD      Family History  Problem Relation Age of Onset  . Cancer Father    Social History:  reports that he quit smoking about 25 years ago. His smoking use included Cigarettes. He started smoking about 47 years ago. He has a 4 pack-year smoking history. He has never used smokeless tobacco. He reports that he does not drink alcohol or use illicit drugs.  Allergies: No Known Allergies   (Not in a hospital admission)  No results found for this or any previous visit (from the past 48 hour(s)). No results found.  Review of Systems  Constitutional: Negative.   HENT: Negative.   Eyes: Negative.   Respiratory: Negative.    Cardiovascular: Negative.   Gastrointestinal: Negative.   Genitourinary: Negative.   Musculoskeletal: Positive for joint pain.  Skin: Negative.   Neurological: Negative.   Psychiatric/Behavioral: Negative.     There were no vitals taken for this visit. Physical Exam  Constitutional: He is oriented to person, place, and time.  Morbidly obese  HENT:  Head: Normocephalic and atraumatic.  Eyes: Conjunctivae and EOM are normal. Pupils are equal, round, and reactive to light.  Neck: Normal range of motion. Neck supple.  Cardiovascular: Normal rate and regular rhythm.   Respiratory: Effort normal and breath sounds normal.  GI: Soft. Bowel sounds are normal.  Musculoskeletal:  On exam positive impingement sign, positive secondary impingement sign. Weakness in external rotation and abduction. Severe pain with attempted range of motion  Neurological: He is alert and oriented to person, place, and time. He has normal reflexes.  Skin: Skin is warm and dry.  Psychiatric: He has a normal mood and affect.    MRI was reviewed, full thickness rotator cuff tear.  Assessment/Plan Symptomatic full thickness rotator cuff tear on the left.  I had a long discussion with the patient concerning the risks and benefits of a rotator cuff repair, including bleeding, infection, prolonged postoperative recovery, which may require 3 to 5 months until maximum medical improvement. Overight procedure with initiation of early passive range of motion within physical therapy. Avoid  any active motion for the first six weeks. This is all in an effort to avoid recurrent tear of the rotator cuff and adhesive capsulitis. Return to work without use of the arm can be obtained following two weeks. However, driving will be a challenge. We also discussed the possibility of requiring implants including bone anchors,as well as an Allograft patch graft if a massive rotator cuff tear is encountered. Removal of any bones for spurs as  well as bursitis will be performed during the procedure and also any associated anesthetic complications as well.  Discussed options with [name], living with his symptoms versus rotator cuff repair. It would be wise to repair the rotator cuff if possible, if he does not have any contraindications with surgical intervention. He is 460 pounds, I called the OR, and they do have a bed that has the capacity up to 500 pounds. He will need preoperative clearance. He has no open sores at this point. We discussed the possibility of injection.  I had a long discussion with the patient concerning the risks and benefits of the proposed shoulder surgery including need for rotator cuff repair, infection, suboptimal range of motion, adhesive capsulitis, and recurrent tear requiring further surgery. We also discussed the extended recovery requirement for postoperative physical therapy and the time to maximum recovery. I provided the patient with an illustrated handout and discussed that in detail. We also discussed anesthetic complications, DVT, PE, cardiopulmonary dysfunction, etc.  Otherwise, he is feeling healthy. He will see a nutritionist next week. He may require gastric bypass procedure before we can proceed with knee replacement as the likelihood of failure would be high. Continue with current analgesics. We will schedule him for that procedure following clearance if possible.  Plan left shoulder mini-open RCR/SAD, possible patch graft  BISSELL, JACLYN M. PA-C for Dr. Tonita Cong 06/29/2014, 7:17 AM

## 2014-07-01 ENCOUNTER — Other Ambulatory Visit: Payer: Self-pay

## 2014-07-01 DIAGNOSIS — G8929 Other chronic pain: Secondary | ICD-10-CM

## 2014-07-01 DIAGNOSIS — M549 Dorsalgia, unspecified: Principal | ICD-10-CM

## 2014-07-01 DIAGNOSIS — M4620 Osteomyelitis of vertebra, site unspecified: Secondary | ICD-10-CM

## 2014-07-01 MED ORDER — OXYCODONE HCL 20 MG PO TABS: 1.0000 | ORAL_TABLET | Freq: Three times a day (TID) | ORAL | Status: DC | PRN

## 2014-07-01 MED ORDER — OXYCODONE HCL ER 40 MG PO T12A: 40.0000 mg | EXTENDED_RELEASE_TABLET | Freq: Two times a day (BID) | ORAL | Status: DC

## 2014-07-02 ENCOUNTER — Ambulatory Visit (HOSPITAL_COMMUNITY)
Admission: RE | Admit: 2014-07-02 | Discharge: 2014-07-02 | Disposition: A | Discharge status: Home / Self Care | Payer: 59 | Source: Ambulatory Visit | Attending: Specialist | Admitting: Specialist

## 2014-07-02 ENCOUNTER — Encounter (HOSPITAL_COMMUNITY): Payer: Self-pay | Admitting: Pharmacy Technician

## 2014-07-02 ENCOUNTER — Encounter (HOSPITAL_COMMUNITY): Payer: Self-pay

## 2014-07-02 ENCOUNTER — Encounter (HOSPITAL_COMMUNITY)
Admission: RE | Admit: 2014-07-02 | Discharge: 2014-07-02 | Disposition: A | Discharge status: Home / Self Care | Payer: 59 | Source: Ambulatory Visit | Attending: Specialist | Admitting: Specialist

## 2014-07-02 DIAGNOSIS — Z01818 Encounter for other preprocedural examination: Secondary | ICD-10-CM | POA: Insufficient documentation

## 2014-07-02 DIAGNOSIS — M67919 Unspecified disorder of synovium and tendon, unspecified shoulder: Secondary | ICD-10-CM | POA: Diagnosis not present

## 2014-07-02 DIAGNOSIS — I517 Cardiomegaly: Secondary | ICD-10-CM | POA: Insufficient documentation

## 2014-07-02 DIAGNOSIS — M719 Bursopathy, unspecified: Secondary | ICD-10-CM | POA: Insufficient documentation

## 2014-07-02 HISTORY — DX: Non-Hodgkin lymphoma, unspecified, unspecified site: C85.90

## 2014-07-02 HISTORY — DX: Peripheral vascular disease, unspecified: I73.9

## 2014-07-02 HISTORY — DX: Major depressive disorder, single episode, unspecified: F32.9

## 2014-07-02 HISTORY — DX: Depression, unspecified: F32.A

## 2014-07-02 HISTORY — DX: Unspecified cirrhosis of liver: K74.60

## 2014-07-02 HISTORY — DX: Anxiety disorder, unspecified: F41.9

## 2014-07-02 LAB — COMPREHENSIVE METABOLIC PANEL
ALT: 22 U/L (ref 0–53)
AST: 29 U/L (ref 0–37)
Albumin: 3.7 g/dL (ref 3.5–5.2)
Alkaline Phosphatase: 84 U/L (ref 39–117)
Anion gap: 12 (ref 5–15)
BUN: 16 mg/dL (ref 6–23)
CALCIUM: 9.2 mg/dL (ref 8.4–10.5)
CO2: 29 mEq/L (ref 19–32)
Chloride: 98 mEq/L (ref 96–112)
Creatinine, Ser: 0.94 mg/dL (ref 0.50–1.35)
GFR calc non Af Amer: 89 mL/min — ABNORMAL LOW (ref >90)
Glucose, Bld: 109 mg/dL — ABNORMAL HIGH (ref 70–99)
Potassium: 4.5 mEq/L (ref 3.7–5.3)
Sodium: 139 mEq/L (ref 137–147)
Total Bilirubin: 0.7 mg/dL (ref 0.3–1.2)
Total Protein: 6.7 g/dL (ref 6.0–8.3)

## 2014-07-02 LAB — CBC
HCT: 45.9 % (ref 39.0–52.0)
Hemoglobin: 14.8 g/dL (ref 13.0–17.0)
MCH: 30.9 pg (ref 26.0–34.0)
MCHC: 32.2 g/dL (ref 30.0–36.0)
MCV: 95.8 fL (ref 78.0–100.0)
PLATELETS: 129 10*3/uL — AB (ref 150–400)
RBC: 4.79 MIL/uL (ref 4.22–5.81)
RDW: 14.8 % (ref 11.5–15.5)
WBC: 6.5 10*3/uL (ref 4.0–10.5)

## 2014-07-02 NOTE — Patient Instructions (Signed)
Albert James  07/02/2014   Your procedure is scheduled on:  07/12/14    Report to [facility].  Follow the Signs to [place] at   [place]     am  Call this number if you have problems the morning of surgery: [phone]   Remember:   Do not eat food or drink liquids after midnight.   Take these medicines the morning of surgery with A SIP OF WATER:    Do not wear jewelry,   Do not wear lotions, powders, or perfumes. , deodorant     Men may shave face and neck.  Do not bring valuables to the hospital.  Contacts, dentures or bridgework may not be worn into surgery.  Leave suitcase in the car. After surgery it may be brought to your room.  For patients admitted to the hospital, checkout time is 11:00 AM the day of  discharge.          Please read over the following fact sheets that you were given: Albert James - Preparing for Surgery Before surgery, you can play an important role.  Because skin is not sterile, your skin needs to be as free of germs as possible.  You can reduce the number of germs on your skin by washing with CHG (chlorahexidine gluconate) soap before surgery.  CHG is an antiseptic cleaner which kills germs and bonds with the skin to continue killing germs even after washing. Please DO NOT use if you have an allergy to CHG or antibacterial soaps.  If your skin becomes reddened/irritated stop using the CHG and inform your nurse when you arrive at Short Stay. Do not shave (including legs and underarms) for at least 48 hours prior to the first CHG shower.  You may shave your face/neck. Please follow these instructions carefully:  1.  Shower with CHG Soap the night before surgery and the  morning of Surgery.  2.  If you choose to wash your hair, wash your hair first as usual with your  normal  shampoo.  3.  After you shampoo, rinse your hair and body thoroughly to remove the  shampoo.                           4.  Use CHG as you would any other liquid  soap.  You can apply chg directly  to the skin and wash                       Gently with a scrungie or clean washcloth.  5.  Apply the CHG Soap to your body ONLY FROM THE NECK DOWN.   Do not use on face/ open                           Wound or open sores. Avoid contact with eyes, ears mouth and genitals (private parts).                       Wash face,  Genitals (private parts) with your normal soap.             6.  Wash thoroughly, paying special attention to the area where your surgery  will be performed.  7.  Thoroughly rinse your body with warm water from the neck down.  8.  DO NOT shower/wash with your normal soap after using  and rinsing off  the CHG Soap.                9.  Pat yourself dry with a clean towel.            10.  Wear clean pajamas.            11.  Place clean sheets on your bed the night of your first shower and do not  sleep with pets. Day of Surgery : Do not apply any lotions/deodorants the morning of surgery.  Please wear clean clothes to the hospital/surgery James.  FAILURE TO FOLLOW THESE INSTRUCTIONS MAY RESULT IN THE CANCELLATION OF YOUR SURGERY PATIENT SIGNATURE_________________________________  NURSE SIGNATURE__________________________________  ________________________________________________________________________   Albert James  An incentive spirometer is a tool that can help keep your lungs clear and active. This tool measures how well you are filling your lungs with each breath. Taking long deep breaths may help reverse or decrease the chance of developing breathing (pulmonary) problems (especially infection) following:  A long period of time when you are unable to move or be active. BEFORE THE PROCEDURE   If the spirometer includes an indicator to show your best effort, your nurse or respiratory therapist will set it to a desired goal.  If possible, sit up straight or lean slightly forward. Try not to slouch.  Hold the incentive spirometer  in an upright position. INSTRUCTIONS FOR USE  1. Sit on the edge of your bed if possible, or sit up as far as you can in bed or on a chair. 2. Hold the incentive spirometer in an upright position. 3. Breathe out normally. 4. Place the mouthpiece in your mouth and seal your lips tightly around it. 5. Breathe in slowly and as deeply as possible, raising the piston or the ball toward the top of the column. 6. Hold your breath for 3-5 seconds or for as long as possible. Allow the piston or ball to fall to the bottom of the column. 7. Remove the mouthpiece from your mouth and breathe out normally. 8. Rest for a few seconds and repeat Steps 1 through 7 at least 10 times every 1-2 hours when you are awake. Take your time and take a few normal breaths between deep breaths. 9. The spirometer may include an indicator to show your best effort. Use the indicator as a goal to work toward during each repetition. 10. After each set of 10 deep breaths, practice coughing to be sure your lungs are clear. If you have an incision (the cut made at the time of surgery), support your incision when coughing by placing a pillow or rolled up towels firmly against it. Once you are able to get out of bed, walk around indoors and cough well. You may stop using the incentive spirometer when instructed by your caregiver.  RISKS AND COMPLICATIONS  Take your time so you do not get dizzy or light-headed.  If you are in pain, you may need to take or ask for pain medication before doing incentive spirometry. It is harder to take a deep breath if you are having pain. AFTER USE  Rest and breathe slowly and easily.  It can be helpful to keep track of a log of your progress. Your caregiver can provide you with a simple table to help with this. If you are using the spirometer at home, follow these instructions: [place] IF:   You are having difficultly using the spirometer.  You have trouble using  the spirometer as  often as instructed.  Your pain medication is not giving enough relief while using the spirometer.  You develop fever of 100.5 F (38.1 C) or higher. SEEK IMMEDIATE MEDICAL CARE IF:   You cough up bloody sputum that had not been present before.  You develop fever of 102 F (38.9 C) or greater.  You develop worsening pain at or near the incision site. MAKE SURE YOU:   Understand these instructions.  Will watch your condition.  Will get help right away if you are not doing well or get worse. Document Released: 03/14/2007 Document Revised: 01/24/2012 Document Reviewed: 05/15/2007 ExitCare Patient Information 2014 [place].   ________________________________________________________________________ , coughing and deep breathing exercises, leg exercises

## 2014-07-03 ENCOUNTER — Encounter (HOSPITAL_COMMUNITY): Payer: Self-pay

## 2014-07-03 NOTE — Progress Notes (Signed)
Called Orson Slick at [facility] and left her a message to make sure Dr Tonita Cong aware of final EKG faxed via EPIC to him on patient.

## 2014-07-03 NOTE — Progress Notes (Signed)
Attempted to call and speak with Dr Tonita Cong to make sure he has received EKG done 07/03/14. MD in clinic.  Office will have him call me back.

## 2014-07-03 NOTE — Progress Notes (Signed)
Dr Tonita Cong,       Final EKG faxed to you .  Patient does not report any history of atrial fib.

## 2014-07-03 NOTE — Progress Notes (Signed)
Attempted to reach Dr Tonita Cong at office to make sure he is aware of EKG done 07/02/14 on patient.  Dr Tonita Cong will be in the office on 07/03/14 after 130pm.

## 2014-07-03 NOTE — Progress Notes (Signed)
Clearance note on chart from Dr Brigitte Pulse signed 06/04/14 by Leonia Corona.

## 2014-07-03 NOTE — Progress Notes (Signed)
Faxed EKG to [phone] and received confirmation by fax.  Larene Beach, Dr Reather Littler nurse called and stated he could not obtain EKG in EPIC.  EKG sent to fax of Dr Tonita Cong at [phone] and confirmation received.  Attempted to call office back and office staff could not get thru to pod of Dr Tonita Cong to make sure they had received faxed EKG.

## 2014-07-03 NOTE — Progress Notes (Signed)
Left message with Call Center to text Dr Tonita Cong to make sure he received EKG faxed this am dated 07/03/2014.

## 2014-07-08 ENCOUNTER — Encounter: Payer: Self-pay | Admitting: Cardiology

## 2014-07-08 ENCOUNTER — Ambulatory Visit (INDEPENDENT_AMBULATORY_CARE_PROVIDER_SITE_OTHER): Payer: 59 | Admitting: Cardiology

## 2014-07-08 VITALS — BP 120/76 | HR 61 | Ht 71.5 in | Wt >= 6400 oz

## 2014-07-08 DIAGNOSIS — I4891 Unspecified atrial fibrillation: Secondary | ICD-10-CM

## 2014-07-08 DIAGNOSIS — I872 Venous insufficiency (chronic) (peripheral): Secondary | ICD-10-CM

## 2014-07-08 DIAGNOSIS — M67919 Unspecified disorder of synovium and tendon, unspecified shoulder: Secondary | ICD-10-CM | POA: Insufficient documentation

## 2014-07-08 DIAGNOSIS — I4819 Other persistent atrial fibrillation: Secondary | ICD-10-CM | POA: Insufficient documentation

## 2014-07-08 DIAGNOSIS — M67912 Unspecified disorder of synovium and tendon, left shoulder: Secondary | ICD-10-CM

## 2014-07-08 DIAGNOSIS — M719 Bursopathy, unspecified: Secondary | ICD-10-CM

## 2014-07-08 NOTE — Patient Instructions (Addendum)
We will schedule you for an Echocardiogram  If Ok we will clear you for shoulder surgery.  After surgery I would recommend you take an ASA 81 mg daily.

## 2014-07-08 NOTE — Progress Notes (Signed)
[name] Date of Birth: 1956/01/18 Medical Record #[id number]  History of Present Illness: [name] is seen at the request of Dr. Mayra Neer for evaluation of atrial fibrillation. He is a pleasant 58 yo WM with history of non-Hodgkin's lymphoma, cirrhosis, HL, and morbid obesity. He is planning to have rotator cuff surgery by Dr. Tonita Cong. As part of his pre op evaluation he had an Ecg that showed atrial fibrillation with a controlled rate. The patient is unaware of any cardiac complaints. He denies palpitations, dizziness, syncope, or chest pain. He does have chronic dyspnea on exertion which he relates to his morbid obesity. He does feel chronically tired. He has no history of HTN, DM, PAD, or CVA/TIA. No history of CAD or CHF. Reports he had his thyroid checked last year. He does report having Ecgs in the past but this was several years ago.He has never been evaluated for sleep apnea. He is taking Corgard and aldactone for his cirrhosis.    Medication List       This list is accurate as of: 07/08/14 10:07 AM.  Always use your most recent med list.               ALDACTONE 100 MG tablet  Generic drug:  spironolactone  Take 100 mg by mouth 2 (two) times daily.     B-complex with vitamin C tablet  Take 1 tablet by mouth daily.     etodolac 400 MG tablet  Commonly known as:  LODINE  Take 400 mg by mouth 2 (two) times daily.     Fish Oil 1000 MG Caps  Take by mouth 2 (two) times daily.     fluticasone 50 MCG/ACT nasal spray  Commonly known as:  FLONASE  Place 1 spray into the nose as needed for allergies.     furosemide 40 MG tablet  Commonly known as:  LASIX  Take 40 mg by mouth 2 (two) times daily.     glucosamine-chondroitin 500-400 MG tablet  Take 2 tablets by mouth 2 (two) times daily.     lactulose 10 GM/15ML solution  Commonly known as:  [place]  Take by mouth as needed.     nadolol 20 MG tablet  Commonly known as:  CORGARD  Take 20 mg by mouth  every morning.     Oxycodone HCl 20 MG Tabs  Take 1 tablet (20 mg total) by mouth every 8 (eight) hours as needed.     OxyCODONE 40 mg T12a 12 hr tablet  Commonly known as:  OXYCONTIN  Take 1 tablet (40 mg total) by mouth every 12 (twelve) hours.        No Known Allergies  Past Medical History  Diagnosis Date  . Ulcer   . Varicose veins   . Arthritis     lower back  . Back pain   . Knee pain   . Shoulder pain   . nhl; dx'd 2006    chemo comp, lymphoma  . Peripheral vascular disease   . Anxiety   . Depression   . Cirrhosis     hx of   . Non Hodgkin's lymphoma     hx of   . Atrial fibrillation, persistent 07/08/2014    Past Surgical History  Procedure Laterality Date  . Portacath placement  2000    placed and removed  . Appendectomy    . Endovenous ablation saphenous vein w/ laser Left 06-10-2014    EVLA  Left greater saphenous vein  by Curt Jews MD    . Portacath removal     . Biopsy on back at disc     . Hernia repair      x 2  . Left elbow surgery       pin out then pin removed     History   Social History  . Marital Status: Divorced    Spouse Name: N/A    Number of Children: 2  . Years of Education: N/A   Occupational History  . disabled    Social History Main Topics  . Smoking status: Former Smoker -- 0.50 packs/day for 8 years    Types: Cigarettes    Start date: 09/26/1966    Quit date: 06/26/1989  . Smokeless tobacco: Never Used     Comment: quit 38 years ago  . Alcohol Use: No  . Drug Use: No  . Sexual Activity: None   Other Topics Concern  . None   Social History Narrative  . None    Family History  Problem Relation Age of Onset  . Cancer Father     pacemaker    Review of Systems: As noted in HPI.  He has chronic venous insufficiency with venous ulcer on left leg. All other systems were reviewed and are negative.  Physical Exam: BP 120/76  Pulse 61  Ht 5' 11.5" (1.816 m)  Wt 478 lb (216.819 kg)  BMI 65.75 kg/m2 Filed  Weights   07/08/14 0907  Weight: 478 lb (216.819 kg)  GENERAL:  Well appearing, morbidly obese WM in NAD. HEENT:  PERRL, EOMI, sclera are clear. Oropharynx is clear. NECK:  No jugular venous distention, carotid upstroke brisk and symmetric, no bruits, no thyromegaly or adenopathy LUNGS:  Clear to auscultation bilaterally CHEST:  Unremarkable HEART:  RRR,  PMI not displaced or sustained,S1 and S2 within normal limits, no S3, no S4: no clicks, no rubs, no murmurs ABD:  Morbidly obese,Soft, nontender. BS +, no masses or bruits. No hepatomegaly, no splenomegaly EXT:  Both legs are wrapped below the knee. SKIN:  Warm and dry.  No rashes NEURO:  Alert and oriented x 3. Cranial nerves II through XII intact. PSYCH:  Cognitively intact    LABORATORY DATA: Ecg: atrial fibrillation with rate 61 bpm. Nonspecific ST abnormality.  Assessment / Plan: 1. Atrial fibrillation- persistent. Patient is asymptomatic. HR is controlled on Corgard. Will obtain Echo. I think he is unlikely to benefit from restoration of NSR. His CHAD-Vasc score is 0 so I would not recommend long term anticoagulation at this point. After his should surgery I would recommend ASA 81 mg daily. Bleeding risk is also higher with his history of cirrhosis. If his Echo is OK we will clear him for rotator cuff surgery.   2. Morbid obesity. High risk for OSA. Would consider sleep study to evaluate further but will defer to Dr. Brigitte Pulse.  3. Rotator cuff tear.  4. History of Nonhodgkin's lymphoma  5. Cirrhosis- nonalcoholic   '

## 2014-07-09 ENCOUNTER — Ambulatory Visit (HOSPITAL_COMMUNITY)
Admission: RE | Admit: 2014-07-09 | Discharge: 2014-07-09 | Disposition: A | Discharge status: Home / Self Care | Payer: 59 | Source: Ambulatory Visit | Attending: Cardiology | Admitting: Cardiology

## 2014-07-09 DIAGNOSIS — I517 Cardiomegaly: Secondary | ICD-10-CM | POA: Diagnosis not present

## 2014-07-09 DIAGNOSIS — I4891 Unspecified atrial fibrillation: Secondary | ICD-10-CM | POA: Diagnosis present

## 2014-07-09 DIAGNOSIS — I369 Nonrheumatic tricuspid valve disorder, unspecified: Secondary | ICD-10-CM

## 2014-07-09 DIAGNOSIS — M67912 Unspecified disorder of synovium and tendon, left shoulder: Secondary | ICD-10-CM

## 2014-07-09 DIAGNOSIS — I2789 Other specified pulmonary heart diseases: Secondary | ICD-10-CM | POA: Diagnosis not present

## 2014-07-09 DIAGNOSIS — I872 Venous insufficiency (chronic) (peripheral): Secondary | ICD-10-CM

## 2014-07-09 DIAGNOSIS — I4819 Other persistent atrial fibrillation: Secondary | ICD-10-CM

## 2014-07-09 NOTE — Progress Notes (Signed)
2DECHO done 07/09/14 [facility]  Last office visit with Dr P Martinique- 07/08/14 [facility]

## 2014-07-09 NOTE — Progress Notes (Signed)
2D Echocardiogram Complete.  07/09/2014   Albert James [place], RDCS

## 2014-07-11 ENCOUNTER — Telehealth: Payer: Self-pay | Admitting: Cardiology

## 2014-07-11 MED ORDER — DEXTROSE 5 % IV SOLN
3.0000 g | INTRAVENOUS | Status: AC
  Administered 2014-07-12: 3 g via INTRAVENOUS
  Filled 2014-07-11: qty 3000

## 2014-07-11 NOTE — Telephone Encounter (Signed)
Received a call from [facility] with [place] reviewed 07/09/14 echo and cleared patient to have rotator cuff surgery with Dr.Beane.Echo report faxed to [facility] at fax # [phone].

## 2014-07-12 ENCOUNTER — Encounter (HOSPITAL_COMMUNITY): Payer: Self-pay | Admitting: *Deleted

## 2014-07-12 ENCOUNTER — Encounter (HOSPITAL_COMMUNITY)
Admission: RE | Disposition: A | Discharge status: Home / Self Care | Payer: Self-pay | Source: Ambulatory Visit | Attending: Specialist

## 2014-07-12 ENCOUNTER — Ambulatory Visit (HOSPITAL_COMMUNITY): Payer: 59 | Admitting: Anesthesiology

## 2014-07-12 ENCOUNTER — Ambulatory Visit: Payer: Self-pay | Admitting: Orthopedic Surgery

## 2014-07-12 ENCOUNTER — Ambulatory Visit (HOSPITAL_COMMUNITY)
Admission: RE | Admit: 2014-07-12 | Discharge: 2014-07-13 | Disposition: A | Discharge status: Home / Self Care | Payer: 59 | Source: Ambulatory Visit | Attending: Specialist | Admitting: Specialist

## 2014-07-12 ENCOUNTER — Encounter (HOSPITAL_COMMUNITY): Payer: 59 | Admitting: Anesthesiology

## 2014-07-12 DIAGNOSIS — Z6841 Body Mass Index (BMI) 40.0 and over, adult: Secondary | ICD-10-CM | POA: Insufficient documentation

## 2014-07-12 DIAGNOSIS — M25819 Other specified joint disorders, unspecified shoulder: Secondary | ICD-10-CM | POA: Diagnosis not present

## 2014-07-12 DIAGNOSIS — S43429A Sprain of unspecified rotator cuff capsule, initial encounter: Secondary | ICD-10-CM | POA: Diagnosis not present

## 2014-07-12 DIAGNOSIS — M2550 Pain in unspecified joint: Secondary | ICD-10-CM | POA: Diagnosis not present

## 2014-07-12 DIAGNOSIS — M7580 Other shoulder lesions, unspecified shoulder: Secondary | ICD-10-CM

## 2014-07-12 DIAGNOSIS — F3289 Other specified depressive episodes: Secondary | ICD-10-CM | POA: Insufficient documentation

## 2014-07-12 DIAGNOSIS — M479 Spondylosis, unspecified: Secondary | ICD-10-CM | POA: Insufficient documentation

## 2014-07-12 DIAGNOSIS — Z9089 Acquired absence of other organs: Secondary | ICD-10-CM | POA: Insufficient documentation

## 2014-07-12 DIAGNOSIS — K7460 Unspecified cirrhosis of liver: Secondary | ICD-10-CM | POA: Diagnosis not present

## 2014-07-12 DIAGNOSIS — F411 Generalized anxiety disorder: Secondary | ICD-10-CM | POA: Insufficient documentation

## 2014-07-12 DIAGNOSIS — I739 Peripheral vascular disease, unspecified: Secondary | ICD-10-CM | POA: Insufficient documentation

## 2014-07-12 DIAGNOSIS — X58XXXA Exposure to other specified factors, initial encounter: Secondary | ICD-10-CM | POA: Diagnosis not present

## 2014-07-12 DIAGNOSIS — M19019 Primary osteoarthritis, unspecified shoulder: Secondary | ICD-10-CM | POA: Diagnosis not present

## 2014-07-12 DIAGNOSIS — M75120 Complete rotator cuff tear or rupture of unspecified shoulder, not specified as traumatic: Secondary | ICD-10-CM | POA: Diagnosis present

## 2014-07-12 DIAGNOSIS — I4891 Unspecified atrial fibrillation: Secondary | ICD-10-CM | POA: Diagnosis not present

## 2014-07-12 DIAGNOSIS — M75122 Complete rotator cuff tear or rupture of left shoulder, not specified as traumatic: Secondary | ICD-10-CM

## 2014-07-12 DIAGNOSIS — Z87891 Personal history of nicotine dependence: Secondary | ICD-10-CM | POA: Diagnosis not present

## 2014-07-12 DIAGNOSIS — F329 Major depressive disorder, single episode, unspecified: Secondary | ICD-10-CM | POA: Diagnosis not present

## 2014-07-12 DIAGNOSIS — C8589 Other specified types of non-Hodgkin lymphoma, extranodal and solid organ sites: Secondary | ICD-10-CM | POA: Diagnosis not present

## 2014-07-12 HISTORY — PX: SHOULDER OPEN ROTATOR CUFF REPAIR: SHX2407

## 2014-07-12 LAB — CREATININE, SERUM
CREATININE: 0.98 mg/dL (ref 0.50–1.35)
GFR calc Af Amer: >90 mL/min (ref >90)
GFR calc non Af Amer: 88 mL/min — ABNORMAL LOW (ref >90)

## 2014-07-12 LAB — CBC
HCT: 44.7 % (ref 39.0–52.0)
HEMOGLOBIN: 14.4 g/dL (ref 13.0–17.0)
MCH: 31.2 pg (ref 26.0–34.0)
MCHC: 32.2 g/dL (ref 30.0–36.0)
MCV: 97 fL (ref 78.0–100.0)
Platelets: 92 10*3/uL — ABNORMAL LOW (ref 150–400)
RBC: 4.61 MIL/uL (ref 4.22–5.81)
RDW: 15 % (ref 11.5–15.5)
WBC: 5 10*3/uL (ref 4.0–10.5)

## 2014-07-12 SURGERY — REPAIR, ROTATOR CUFF, OPEN
Anesthesia: General | Site: Shoulder | Laterality: Left

## 2014-07-12 MED ORDER — FENTANYL CITRATE 0.05 MG/ML IJ SOLN
INTRAMUSCULAR | Status: AC
  Filled 2014-07-12: qty 2

## 2014-07-12 MED ORDER — FENTANYL CITRATE 0.05 MG/ML IJ SOLN
INTRAMUSCULAR | Status: DC | PRN
  Administered 2014-07-12 (×2): 12.5 ug via INTRAVENOUS

## 2014-07-12 MED ORDER — SODIUM CHLORIDE 0.9 % IR SOLN
Status: AC
  Filled 2014-07-12: qty 1

## 2014-07-12 MED ORDER — METHOCARBAMOL 500 MG PO TABS
500.0000 mg | ORAL_TABLET | Freq: Four times a day (QID) | ORAL | Status: DC | PRN
  Administered 2014-07-12 – 2014-07-13 (×2): 500 mg via ORAL
  Filled 2014-07-12 (×2): qty 1

## 2014-07-12 MED ORDER — ONDANSETRON HCL 4 MG/2ML IJ SOLN: 4.0000 mg | Freq: Four times a day (QID) | INTRAMUSCULAR | Status: DC | PRN

## 2014-07-12 MED ORDER — B COMPLEX-C PO TABS
1.0000 | ORAL_TABLET | Freq: Every day | ORAL | Status: DC
  Administered 2014-07-12 – 2014-07-13 (×2): 1 via ORAL
  Filled 2014-07-12 (×2): qty 1

## 2014-07-12 MED ORDER — OXYCODONE HCL ER 20 MG PO T12A
40.0000 mg | EXTENDED_RELEASE_TABLET | Freq: Two times a day (BID) | ORAL | Status: DC
  Administered 2014-07-12 – 2014-07-13 (×2): 40 mg via ORAL
  Filled 2014-07-12 (×2): qty 2

## 2014-07-12 MED ORDER — ONDANSETRON HCL 4 MG/2ML IJ SOLN
INTRAMUSCULAR | Status: AC
  Filled 2014-07-12: qty 2

## 2014-07-12 MED ORDER — HYDROMORPHONE HCL PF 1 MG/ML IJ SOLN
0.2500 mg | INTRAMUSCULAR | Status: AC | PRN
  Administered 2014-07-12 (×8): 0.25 mg via INTRAVENOUS

## 2014-07-12 MED ORDER — PROPOFOL 10 MG/ML IV BOLUS
INTRAVENOUS | Status: AC
  Filled 2014-07-12: qty 20

## 2014-07-12 MED ORDER — MENTHOL 3 MG MT LOZG
1.0000 | LOZENGE | OROMUCOSAL | Status: DC | PRN
  Filled 2014-07-12: qty 9

## 2014-07-12 MED ORDER — PHENYLEPHRINE 40 MCG/ML (10ML) SYRINGE FOR IV PUSH (FOR BLOOD PRESSURE SUPPORT)
PREFILLED_SYRINGE | INTRAVENOUS | Status: AC
  Filled 2014-07-12: qty 10

## 2014-07-12 MED ORDER — CEFAZOLIN SODIUM-DEXTROSE 2-3 GM-% IV SOLR
2.0000 g | Freq: Four times a day (QID) | INTRAVENOUS | Status: AC
  Administered 2014-07-12 – 2014-07-13 (×3): 2 g via INTRAVENOUS
  Filled 2014-07-12 (×3): qty 50

## 2014-07-12 MED ORDER — DOCUSATE SODIUM 100 MG PO CAPS: 100.0000 mg | ORAL_CAPSULE | Freq: Two times a day (BID) | ORAL | Status: DC | PRN

## 2014-07-12 MED ORDER — FLUTICASONE PROPIONATE 50 MCG/ACT NA SUSP
1.0000 | Freq: Every day | NASAL | Status: DC | PRN
  Filled 2014-07-12: qty 16

## 2014-07-12 MED ORDER — ONDANSETRON HCL 4 MG PO TABS: 4.0000 mg | ORAL_TABLET | Freq: Four times a day (QID) | ORAL | Status: DC | PRN

## 2014-07-12 MED ORDER — METHOCARBAMOL 500 MG PO TABS: 500.0000 mg | ORAL_TABLET | Freq: Three times a day (TID) | ORAL | Status: DC

## 2014-07-12 MED ORDER — LIDOCAINE HCL (CARDIAC) 20 MG/ML IV SOLN
INTRAVENOUS | Status: AC
  Filled 2014-07-12: qty 5

## 2014-07-12 MED ORDER — FUROSEMIDE 40 MG PO TABS
40.0000 mg | ORAL_TABLET | Freq: Two times a day (BID) | ORAL | Status: DC
  Administered 2014-07-12 – 2014-07-13 (×2): 40 mg via ORAL
  Filled 2014-07-12 (×4): qty 1

## 2014-07-12 MED ORDER — SUCCINYLCHOLINE CHLORIDE 20 MG/ML IJ SOLN
INTRAMUSCULAR | Status: DC | PRN
  Administered 2014-07-12: 200 mg via INTRAVENOUS

## 2014-07-12 MED ORDER — ONDANSETRON HCL 4 MG/2ML IJ SOLN
INTRAMUSCULAR | Status: DC | PRN
  Administered 2014-07-12: 4 mg via INTRAVENOUS

## 2014-07-12 MED ORDER — KETOROLAC TROMETHAMINE 30 MG/ML IJ SOLN
30.0000 mg | Freq: Four times a day (QID) | INTRAMUSCULAR | Status: DC
  Administered 2014-07-12 – 2014-07-13 (×4): 30 mg via INTRAVENOUS
  Filled 2014-07-12 (×7): qty 1

## 2014-07-12 MED ORDER — KETOROLAC TROMETHAMINE 30 MG/ML IJ SOLN
INTRAMUSCULAR | Status: AC
  Filled 2014-07-12: qty 1

## 2014-07-12 MED ORDER — SPIRONOLACTONE 100 MG PO TABS
100.0000 mg | ORAL_TABLET | Freq: Two times a day (BID) | ORAL | Status: DC
  Administered 2014-07-13: 100 mg via ORAL
  Filled 2014-07-12 (×2): qty 1

## 2014-07-12 MED ORDER — SODIUM CHLORIDE 0.9 % IR SOLN
Status: DC | PRN
  Administered 2014-07-12: 12:00:00

## 2014-07-12 MED ORDER — ACETAMINOPHEN 650 MG RE SUPP: 650.0000 mg | Freq: Four times a day (QID) | RECTAL | Status: DC | PRN

## 2014-07-12 MED ORDER — METOCLOPRAMIDE HCL 10 MG PO TABS: 5.0000 mg | ORAL_TABLET | Freq: Three times a day (TID) | ORAL | Status: DC | PRN

## 2014-07-12 MED ORDER — PHENYLEPHRINE HCL 10 MG/ML IJ SOLN
INTRAMUSCULAR | Status: DC | PRN
  Administered 2014-07-12: 40 ug via INTRAVENOUS
  Administered 2014-07-12 (×2): 80 ug via INTRAVENOUS

## 2014-07-12 MED ORDER — LIDOCAINE HCL (CARDIAC) 20 MG/ML IV SOLN
INTRAVENOUS | Status: DC | PRN
  Administered 2014-07-12: 100 mg via INTRAVENOUS

## 2014-07-12 MED ORDER — BUPIVACAINE-EPINEPHRINE 0.5% -1:200000 IJ SOLN
INTRAMUSCULAR | Status: DC | PRN
Start: 2014-07-12 — End: 2014-07-12
  Administered 2014-07-12: 20 mL

## 2014-07-12 MED ORDER — DEXTROSE 5 % IV SOLN
500.0000 mg | Freq: Four times a day (QID) | INTRAVENOUS | Status: DC | PRN
  Administered 2014-07-12: 500 mg via INTRAVENOUS
  Filled 2014-07-12: qty 5

## 2014-07-12 MED ORDER — LACTATED RINGERS IV SOLN
INTRAVENOUS | Status: DC
  Administered 2014-07-12: 1000 mL via INTRAVENOUS
  Administered 2014-07-12: 12:00:00 via INTRAVENOUS

## 2014-07-12 MED ORDER — ENOXAPARIN SODIUM 40 MG/0.4ML ~~LOC~~ SOLN
40.0000 mg | SUBCUTANEOUS | Status: DC
  Administered 2014-07-13: 40 mg via SUBCUTANEOUS
  Filled 2014-07-12 (×2): qty 0.4

## 2014-07-12 MED ORDER — KCL IN DEXTROSE-NACL 20-5-0.45 MEQ/L-%-% IV SOLN
INTRAVENOUS | Status: DC
  Administered 2014-07-12: 14:00:00 via INTRAVENOUS
  Administered 2014-07-12: 100 mL/h via INTRAVENOUS
  Filled 2014-07-12 (×3): qty 1000

## 2014-07-12 MED ORDER — KCL IN DEXTROSE-NACL 20-5-0.45 MEQ/L-%-% IV SOLN
INTRAVENOUS | Status: AC
  Administered 2014-07-12: 1000 mL
  Filled 2014-07-12: qty 1000

## 2014-07-12 MED ORDER — DOCUSATE SODIUM 100 MG PO CAPS
100.0000 mg | ORAL_CAPSULE | Freq: Two times a day (BID) | ORAL | Status: DC
  Administered 2014-07-12 – 2014-07-13 (×2): 100 mg via ORAL

## 2014-07-12 MED ORDER — OXYCODONE-ACETAMINOPHEN 7.5-325 MG PO TABS
1.0000 | ORAL_TABLET | ORAL | Status: DC | PRN
Start: 2014-07-12 — End: 2014-07-18

## 2014-07-12 MED ORDER — ETOMIDATE 2 MG/ML IV SOLN
INTRAVENOUS | Status: AC
  Filled 2014-07-12: qty 10

## 2014-07-12 MED ORDER — MIDAZOLAM HCL 2 MG/2ML IJ SOLN
INTRAMUSCULAR | Status: AC
  Filled 2014-07-12: qty 2

## 2014-07-12 MED ORDER — ETOMIDATE 2 MG/ML IV SOLN
INTRAVENOUS | Status: DC | PRN
  Administered 2014-07-12: 2 mg via INTRAVENOUS
  Administered 2014-07-12 (×2): 3 mg via INTRAVENOUS
  Administered 2014-07-12: 20 mg via INTRAVENOUS

## 2014-07-12 MED ORDER — OXYCODONE-ACETAMINOPHEN 5-325 MG PO TABS
ORAL_TABLET | ORAL | Status: AC
  Filled 2014-07-12: qty 2

## 2014-07-12 MED ORDER — OXYCODONE-ACETAMINOPHEN 5-325 MG PO TABS
1.0000 | ORAL_TABLET | ORAL | Status: DC | PRN
  Administered 2014-07-12 – 2014-07-13 (×4): 2 via ORAL
  Filled 2014-07-12 (×3): qty 2

## 2014-07-12 MED ORDER — PHENOL 1.4 % MT LIQD
1.0000 | OROMUCOSAL | Status: DC | PRN
  Filled 2014-07-12: qty 177

## 2014-07-12 MED ORDER — HYDROMORPHONE HCL PF 1 MG/ML IJ SOLN
INTRAMUSCULAR | Status: AC
  Filled 2014-07-12: qty 1

## 2014-07-12 MED ORDER — ACETAMINOPHEN 325 MG PO TABS: 650.0000 mg | ORAL_TABLET | Freq: Four times a day (QID) | ORAL | Status: DC | PRN

## 2014-07-12 MED ORDER — METOCLOPRAMIDE HCL 5 MG/ML IJ SOLN: 5.0000 mg | Freq: Three times a day (TID) | INTRAMUSCULAR | Status: DC | PRN

## 2014-07-12 MED ORDER — HYDROMORPHONE HCL PF 1 MG/ML IJ SOLN
0.5000 mg | INTRAMUSCULAR | Status: DC | PRN
  Administered 2014-07-12 – 2014-07-13 (×6): 1 mg via INTRAVENOUS
  Filled 2014-07-12 (×6): qty 1

## 2014-07-12 MED ORDER — LACTULOSE 10 GM/15ML PO SOLN
20.0000 g | ORAL | Status: DC | PRN
  Filled 2014-07-12: qty 30

## 2014-07-12 MED ORDER — DEXAMETHASONE SODIUM PHOSPHATE 10 MG/ML IJ SOLN
INTRAMUSCULAR | Status: AC
Start: 2014-07-12 — End: 2014-07-12
  Filled 2014-07-12: qty 1

## 2014-07-12 MED ORDER — HYDROCODONE-ACETAMINOPHEN 5-325 MG PO TABS
1.0000 | ORAL_TABLET | ORAL | Status: DC | PRN
  Administered 2014-07-12: 2 via ORAL
  Filled 2014-07-12: qty 2

## 2014-07-12 MED ORDER — BUPIVACAINE-EPINEPHRINE (PF) 0.5% -1:200000 IJ SOLN
INTRAMUSCULAR | Status: AC
  Filled 2014-07-12: qty 30

## 2014-07-12 MED ORDER — DEXAMETHASONE SODIUM PHOSPHATE 10 MG/ML IJ SOLN
INTRAMUSCULAR | Status: DC | PRN
  Administered 2014-07-12: 10 mg via INTRAVENOUS

## 2014-07-12 MED ORDER — NADOLOL 20 MG PO TABS
20.0000 mg | ORAL_TABLET | Freq: Every morning | ORAL | Status: DC
  Administered 2014-07-13: 20 mg via ORAL
  Filled 2014-07-12: qty 1

## 2014-07-12 MED ORDER — FENTANYL CITRATE 0.05 MG/ML IJ SOLN
25.0000 ug | INTRAMUSCULAR | Status: DC | PRN
  Administered 2014-07-12 (×3): 50 ug via INTRAVENOUS

## 2014-07-12 MED ORDER — PROPOFOL 10 MG/ML IV BOLUS
INTRAVENOUS | Status: DC | PRN
  Administered 2014-07-12: 15 mg via INTRAVENOUS
  Administered 2014-07-12: 100 mg via INTRAVENOUS
  Administered 2014-07-12: 15 mg via INTRAVENOUS
  Administered 2014-07-12: 10 mg via INTRAVENOUS

## 2014-07-12 SURGICAL SUPPLY — 49 items
ANCH SUT PUSHLCK 24X4.5 STRL (Orthopedic Implant) ×2 IMPLANT
ANCH SUT SWLK 19.1X4.75 VT (Anchor) ×2 IMPLANT
ANCHOR NDL 9/16 CIR SZ 8 (NEEDLE) IMPLANT
ANCHOR NEEDLE 9/16 CIR SZ 8 (NEEDLE) IMPLANT
ANCHOR PEEK 4.75X19.1 SWLK C (Anchor) ×2 IMPLANT
BAG SPEC THK2 15X12 ZIP CLS (MISCELLANEOUS)
BAG ZIPLOCK 12X15 (MISCELLANEOUS) IMPLANT
BNDG COHESIVE 6X5 TAN STRL LF (GAUZE/BANDAGES/DRESSINGS) ×2 IMPLANT
CLEANER TIP ELECTROSURG 2X2 (MISCELLANEOUS) ×2 IMPLANT
CLOTH 2% CHLOROHEXIDINE 3PK (PERSONAL CARE ITEMS) ×2 IMPLANT
DRAPE LG THREE QUARTER DISP (DRAPES) ×2 IMPLANT
DRAPE ORTHO SPLIT 77X108 STRL (DRAPES) ×2
DRAPE POUCH INSTRU U-SHP 10X18 (DRAPES) ×2 IMPLANT
DRAPE SURG ORHT 6 SPLT 77X108 (DRAPES) ×1 IMPLANT
DRSG AQUACEL AG ADV 3.5X 4 (GAUZE/BANDAGES/DRESSINGS) IMPLANT
DRSG AQUACEL AG ADV 3.5X 6 (GAUZE/BANDAGES/DRESSINGS) ×1 IMPLANT
DURAPREP 26ML APPLICATOR (WOUND CARE) ×3 IMPLANT
ELECT NDL TIP 2.8 STRL (NEEDLE) ×1 IMPLANT
ELECT NEEDLE TIP 2.8 STRL (NEEDLE) ×2 IMPLANT
ELECT REM PT RETURN 9FT ADLT (ELECTROSURGICAL) ×2
ELECTRODE REM PT RTRN 9FT ADLT (ELECTROSURGICAL) ×1 IMPLANT
GLOVE BIOGEL PI IND STRL 7.5 (GLOVE) ×1 IMPLANT
GLOVE BIOGEL PI IND STRL 8 (GLOVE) IMPLANT
GLOVE BIOGEL PI INDICATOR 7.5 (GLOVE) ×1
GLOVE BIOGEL PI INDICATOR 8 (GLOVE)
GLOVE SURG SS PI 7.5 STRL IVOR (GLOVE) ×2 IMPLANT
GLOVE SURG SS PI 8.0 STRL IVOR (GLOVE) ×4 IMPLANT
GOWN STRL REUS W/TWL XL LVL3 (GOWN DISPOSABLE) ×4 IMPLANT
KIT BASIN OR (CUSTOM PROCEDURE TRAY) ×2 IMPLANT
KIT POSITION SHOULDER SCHLEI (MISCELLANEOUS) ×1 IMPLANT
MANIFOLD NEPTUNE II (INSTRUMENTS) ×2 IMPLANT
NDL SCORPION MULTI FIRE (NEEDLE) IMPLANT
NEEDLE SCORPION MULTI FIRE (NEEDLE) ×2 IMPLANT
PACK SHOULDER CUSTOM OPM052 (CUSTOM PROCEDURE TRAY) ×2 IMPLANT
POSITIONER SURGICAL ARM (MISCELLANEOUS) ×1 IMPLANT
PUSHLOCK PEEK 4.5X24 (Orthopedic Implant) ×2 IMPLANT
SLING ARM IMMOBILIZER LRG (SOFTGOODS) ×1 IMPLANT
SLING ULTRA II L (ORTHOPEDIC SUPPLIES) IMPLANT
STRIP CLOSURE SKIN 1/2X4 (GAUZE/BANDAGES/DRESSINGS) ×2 IMPLANT
SUT BONE WAX W31G (SUTURE) IMPLANT
SUT ETHIBOND NAB CT1 #1 30IN (SUTURE) IMPLANT
SUT FIBERWIRE #2 38 T-5 BLUE (SUTURE)
SUT PROLENE 3 0 PS 2 (SUTURE) ×2 IMPLANT
SUT TIGER TAPE 7 IN WHITE (SUTURE) IMPLANT
SUT VIC AB 1-0 CT2 27 (SUTURE) ×2 IMPLANT
SUT VIC AB 2-0 CT2 27 (SUTURE) ×2 IMPLANT
SUT VICRYL 0-0 OS 2 NEEDLE (SUTURE) IMPLANT
SUTURE FIBERWR #2 38 T-5 BLUE (SUTURE) IMPLANT
TAPE FIBER 2MM 7IN #2 BLUE (SUTURE) IMPLANT

## 2014-07-12 NOTE — Discharge Instructions (Signed)
Aquacel dressing may remain in place until follow up. Okay to shower with dressing in place. Use sling at times except when exercising or showering No driving for 4-6 weeks No lifting for 6 weeks operative arm Pendulum exercises after you see Dr. Felipa Eth to move wrist,elbow, and hand. See Dr. Tonita Cong in 10-14 days. Take one aspirin per day with a meal if not on a blood thinner or allergic to aspirin.

## 2014-07-12 NOTE — Interval H&P Note (Signed)
History and Physical Interval Note:  07/12/2014 7:22 AM  Albert James  has presented today for surgery, with the diagnosis of LEFT SHOULDER ROTATOR CUFF TEAR     The various methods of treatment have been discussed with the patient and family. After consideration of risks, benefits and other options for treatment, the patient has consented to  Procedure(s): LEFT SHOULDER MINI OPEN ROTATOR CUFF REPAIR  SUBACROMIAL DECOMPRESSION POSSIBLE PATCH GRAFT  (Left) as a surgical intervention .  The patient's history has been reviewed, patient examined, no change in status, stable for surgery.  I have reviewed the patient's chart and labs.  Questions were answered to the patient's satisfaction.     [name]

## 2014-07-12 NOTE — Anesthesia Postprocedure Evaluation (Signed)
  Anesthesia Post-op Note  Patient: Albert James  Procedure(s) Performed: Procedure(s) (LRB): LEFT SHOULDER MINI OPEN ROTATOR CUFF REPAIR  SUBACROMIAL DECOMPRESSION  (Left)  Patient Location: PACU  Anesthesia Type: General  Level of Consciousness: awake and alert   Airway and Oxygen Therapy: Patient Spontanous Breathing  Post-op Pain: mild  Post-op Assessment: Post-op Vital signs reviewed, Patient's Cardiovascular Status Stable, Respiratory Function Stable, Patent Airway and No signs of Nausea or vomiting  Last Vitals:  Filed Vitals:   07/12/14 1516  BP: 127/80  Pulse: 70  Temp: 37 C  Resp: 18    Post-op Vital Signs: stable   Complications: No apparent anesthesia complications. No sedation and no apnea in PACU. To floor on apnea precautions. Narcotics titrated to effect in PACU.

## 2014-07-12 NOTE — H&P (View-Only) (Signed)
Albert James is an 58 y.o. male.   Chief Complaint: L shoulder pain HPI: The patient is a 58 year old male who presents today for follow up of their shoulder. The patient is being followed for their left shoulder pain. They are 4 month(s) out from a fall. Symptoms reported today include: pain. Current treatment includes: relative rest, activity modification, NSAIDs and pain medications. The following medication has been used for pain control: Oxycodone (in pain management). The patient presents today following MRI.  Albert James follows up. He has full thickness tear in his rotator cuff tendon, tear of his biceps tendon retracted. AC degenerative disease. He has pain with any overhead activities. Difficulty using the arm. He is having difficulty using his rolling walker. He recently weighed himself at 460-465.  Past Medical History  Diagnosis Date  . Ulcer   . Varicose veins   . Arthritis     lower back  . Back pain   . Knee pain   . Shoulder pain   . nhl; dx'd 2006    chemo comp, lymphoma  . Peripheral vascular disease   . Anxiety   . Depression   . Cirrhosis     hx of   . Non Hodgkin's lymphoma     hx of   . Atrial fibrillation, persistent 07/08/2014    Past Surgical History  Procedure Laterality Date  . Portacath placement  2000    placed and removed  . Appendectomy    . Endovenous ablation saphenous vein w/ laser Left 06-10-2014    EVLA  Left greater saphenous vein by Curt Jews MD    . Portacath removal     . Biopsy on back at disc     . Hernia repair      x 2  . Left elbow surgery       pin out then pin removed     Family History  Problem Relation Age of Onset  . Cancer Father     pacemaker   Social History:  reports that he quit smoking about 25 years ago. His smoking use included Cigarettes. He started smoking about 47 years ago. He has a 4 pack-year smoking history. He has never used smokeless tobacco. He reports that he does not drink alcohol  or use illicit drugs.  Allergies: No Known Allergies   (Not in a hospital admission)  No results found for this or any previous visit (from the past 48 hour(s)). No results found.  Review of Systems  Constitutional: Negative.   HENT: Negative.   Eyes: Negative.   Respiratory: Negative.   Cardiovascular: Negative.   Gastrointestinal: Negative.   Genitourinary: Negative.   Musculoskeletal: Positive for joint pain.  Skin: Negative.   Neurological: Negative.   Psychiatric/Behavioral: Negative.     There were no vitals taken for this visit. Physical Exam  Constitutional: He is oriented to person, place, and time.  Morbidly obese  HENT:  Head: Normocephalic and atraumatic.  Eyes: Conjunctivae and EOM are normal. Pupils are equal, round, and reactive to light.  Neck: Normal range of motion. Neck supple.  Cardiovascular:  Murmur heard. Respiratory: Effort normal and breath sounds normal.  GI: Soft. Bowel sounds are normal.  Musculoskeletal:  On exam positive impingement sign, positive secondary impingement sign. Weakness in external rotation and abduction. Severe pain with attempted range of motion.   Neurological: He is alert and oriented to person, place, and time. He has normal reflexes.  Skin: Skin is warm and  dry.  Psychiatric: He has a normal mood and affect.     RADIOGRAPHS: MRI was reviewed, full thickness rotator cuff tear.  Assessment/Plan Symptomatic full thickness rotator cuff tear on the left.  Discussed options with [name], living with his symptoms versus rotator cuff repair. It would be wise to repair the rotator cuff if possible, if he does not have any contraindications with surgical intervention. He is 460 pounds, I called the OR, and they do have a bed that has the capacity up to 500 pounds. He will need preoperative clearance. He has no open sores at this point. We discussed the possibility of injection.  I had a long discussion with the  patient concerning the risks and benefits of the proposed shoulder surgery including need for rotator cuff repair, infection, suboptimal range of motion, adhesive capsulitis, and recurrent tear requiring further surgery. We also discussed the extended recovery requirement for postoperative physical therapy and the time to maximum recovery. I provided the patient with an illustrated handout and discussed that in detail. We also discussed anesthetic complications, DVT, PE, cardiopulmonary dysfunction, etc.  I had a long discussion with the patient concerning the risks and benefits of a rotator cuff repair, including bleeding, infection, prolonged postoperative recovery, which may require 3 to 5 months until maximum medical improvement. Overight procedure with initiation of early passive range of motion within physical therapy. Avoid any active motion for the first six weeks. This is all in an effort to avoid recurrent tear of the rotator cuff and adhesive capsulitis. Return to work without use of the arm can be obtained following two weeks. However, driving will be a challenge. We also discussed the possibility of requiring implants including bone anchors,as well as an Allograft patch graft if a massive rotator cuff tear is encountered. Removal of any bones for spurs as well as bursitis will be performed during the procedure and also any associated anesthetic complications as well.  Otherwise, he is feeling healthy. He will see a nutritionist next week. He may require gastric bypass procedure before we can proceed with knee replacement as the likelihood of failure would be high. Continue with current analgesics. We will schedule him for that procedure following clearance if possible.  Toyoko Silos M. 07/12/2014, 6:50 AM

## 2014-07-12 NOTE — Interval H&P Note (Signed)
History and Physical Interval Note:  07/12/2014 7:34 AM  Albert James  has presented today for surgery, with the diagnosis of LEFT SHOULDER ROTATOR CUFF TEAR     The various methods of treatment have been discussed with the patient and family. After consideration of risks, benefits and other options for treatment, the patient has consented to  Procedure(s): LEFT SHOULDER MINI OPEN ROTATOR CUFF REPAIR  SUBACROMIAL DECOMPRESSION POSSIBLE PATCH GRAFT  (Left) as a surgical intervention .  The patient's history has been reviewed, patient examined, no change in status, stable for surgery.  I have reviewed the patient's chart and labs.  Questions were answered to the patient's satisfaction.     [name] C

## 2014-07-12 NOTE — H&P (Signed)
Albert James is an 58 y.o. male.   Chief Complaint: L shoulder pain HPI: The patient is a 58 year old male who presents today for follow up of their shoulder. The patient is being followed for their left shoulder pain. They are 4 month(s) out from a fall. Symptoms reported today include: pain. Current treatment includes: relative rest, activity modification, NSAIDs and pain medications. The following medication has been used for pain control: Oxycodone (in pain management). The patient presents today following MRI.  Albert James follows up. He has full thickness tear in his rotator cuff tendon, tear of his biceps tendon retracted. AC degenerative disease. He has pain with any overhead activities. Difficulty using the arm. He is having difficulty using his rolling walker. He recently weighed himself at 460-465.  Past Medical History  Diagnosis Date  . Ulcer   . Varicose veins   . Arthritis     lower back  . Back pain   . Knee pain   . Shoulder pain   . nhl; dx'd 2006    chemo comp, lymphoma  . Peripheral vascular disease   . Anxiety   . Depression   . Cirrhosis     hx of   . Non Hodgkin's lymphoma     hx of   . Atrial fibrillation, persistent 07/08/2014    Past Surgical History  Procedure Laterality Date  . Portacath placement  2000    placed and removed  . Appendectomy    . Endovenous ablation saphenous vein w/ laser Left 06-10-2014    EVLA  Left greater saphenous vein by Curt Jews MD    . Portacath removal     . Biopsy on back at disc     . Hernia repair      x 2  . Left elbow surgery       pin out then pin removed     Family History  Problem Relation Age of Onset  . Cancer Father     pacemaker   Social History:  reports that he quit smoking about 25 years ago. His smoking use included Cigarettes. He started smoking about 47 years ago. He has a 4 pack-year smoking history. He has never used smokeless tobacco. He reports that he does not drink alcohol  or use illicit drugs.  Allergies: No Known Allergies   (Not in a hospital admission)  No results found for this or any previous visit (from the past 48 hour(s)). No results found.  Review of Systems  Constitutional: Negative.   HENT: Negative.   Eyes: Negative.   Respiratory: Negative.   Cardiovascular: Negative.   Gastrointestinal: Negative.   Genitourinary: Negative.   Musculoskeletal: Positive for joint pain.  Skin: Negative.   Neurological: Negative.   Psychiatric/Behavioral: Negative.     There were no vitals taken for this visit. Physical Exam  Constitutional: He is oriented to person, place, and time.  Morbidly obese  HENT:  Head: Normocephalic and atraumatic.  Eyes: Conjunctivae and EOM are normal. Pupils are equal, round, and reactive to light.  Neck: Normal range of motion. Neck supple.  Cardiovascular:  Murmur heard. Respiratory: Effort normal and breath sounds normal.  GI: Soft. Bowel sounds are normal.  Musculoskeletal:  On exam positive impingement sign, positive secondary impingement sign. Weakness in external rotation and abduction. Severe pain with attempted range of motion.   Neurological: He is alert and oriented to person, place, and time. He has normal reflexes.  Skin: Skin is warm and  dry.  Psychiatric: He has a normal mood and affect.     RADIOGRAPHS: MRI was reviewed, full thickness rotator cuff tear.  Assessment/Plan Symptomatic full thickness rotator cuff tear on the left.  Discussed options with [name], living with his symptoms versus rotator cuff repair. It would be wise to repair the rotator cuff if possible, if he does not have any contraindications with surgical intervention. He is 460 pounds, I called the OR, and they do have a bed that has the capacity up to 500 pounds. He will need preoperative clearance. He has no open sores at this point. We discussed the possibility of injection.  I had a long discussion with the  patient concerning the risks and benefits of the proposed shoulder surgery including need for rotator cuff repair, infection, suboptimal range of motion, adhesive capsulitis, and recurrent tear requiring further surgery. We also discussed the extended recovery requirement for postoperative physical therapy and the time to maximum recovery. I provided the patient with an illustrated handout and discussed that in detail. We also discussed anesthetic complications, DVT, PE, cardiopulmonary dysfunction, etc.  I had a long discussion with the patient concerning the risks and benefits of a rotator cuff repair, including bleeding, infection, prolonged postoperative recovery, which may require 3 to 5 months until maximum medical improvement. Overight procedure with initiation of early passive range of motion within physical therapy. Avoid any active motion for the first six weeks. This is all in an effort to avoid recurrent tear of the rotator cuff and adhesive capsulitis. Return to work without use of the arm can be obtained following two weeks. However, driving will be a challenge. We also discussed the possibility of requiring implants including bone anchors,as well as an Allograft patch graft if a massive rotator cuff tear is encountered. Removal of any bones for spurs as well as bursitis will be performed during the procedure and also any associated anesthetic complications as well.  Otherwise, he is feeling healthy. He will see a nutritionist next week. He may require gastric bypass procedure before we can proceed with knee replacement as the likelihood of failure would be high. Continue with current analgesics. We will schedule him for that procedure following clearance if possible.  James, Albert M. 07/12/2014, 6:50 AM

## 2014-07-12 NOTE — Brief Op Note (Signed)
07/12/2014  12:09 PM  PATIENT:  [name]  58 y.o. male  PRE-OPERATIVE DIAGNOSIS:  LEFT SHOULDER ROTATOR CUFF TEAR     POST-OPERATIVE DIAGNOSIS:  LEFT SHOULDER ROTATOR CUFF TEAR     PROCEDURE:  Procedure(s): LEFT SHOULDER MINI OPEN ROTATOR CUFF REPAIR  SUBACROMIAL DECOMPRESSION  (Left)  SURGEON:  Surgeon(s) and Role:    * Johnn Hai, MD - Primary  PHYSICIAN ASSISTANT:   ASSISTANTS: Bissell   ANESTHESIA:   general  EBL:     BLOOD ADMINISTERED:none  DRAINS: none   LOCAL MEDICATIONS USED:  MARCAINE     SPECIMEN:  No Specimen  DISPOSITION OF SPECIMEN:  N/A  COUNTS:  YES  TOURNIQUET:  * No tourniquets in log *  DICTATION: .Other Dictation: Dictation Number  [id number]  PLAN OF CARE: Admit for overnight observation  PATIENT DISPOSITION:  PACU - hemodynamically stable.   Delay start of Pharmacological VTE agent (>24hrs) due to surgical blood loss or risk of bleeding: no

## 2014-07-12 NOTE — Anesthesia Preprocedure Evaluation (Signed)
Anesthesia Evaluation  Patient identified by MRN, date of birth, ID band Patient awake  General Assessment Comment:  Ulcer     .  Varicose veins     .  Arthritis         lower back   .  Back pain     .  Knee pain     .  Shoulder pain     .  nhl;  dx'd 2006       chemo comp, lymphoma   .  Peripheral vascular disease     .  Anxiety     .  Depression     .  Cirrhosis         hx of    .  Non Hodgkin's lymphoma         hx of    .  Atrial fibrillation, persistent  07/08/2014     Reviewed: Allergy & Precautions, H&P , NPO status , Patient's Chart, lab work & pertinent test results  Airway Mallampati: II TM Distance: >3 FB Neck ROM: Full    Dental no notable dental hx.    Pulmonary former smoker,  breath sounds clear to auscultation  Pulmonary exam normal       Cardiovascular + Peripheral Vascular Disease + dysrhythmias Atrial Fibrillation Rhythm:Regular Rate:Normal  Seen by Dr. Martinique 07-08-14  ECHO 8-25: EF 60%, Moderate pulmonary HTN  Clearance cardiology   Neuro/Psych PSYCHIATRIC DISORDERS Anxiety Depression negative neurological ROS     GI/Hepatic negative GI ROS, (+) Cirrhosis -      ,   Endo/Other  Morbid obesity  Renal/GU negative Renal ROS  negative genitourinary   Musculoskeletal negative musculoskeletal ROS (+)   Abdominal (+) + obese,   Peds negative pediatric ROS (+)  Hematology negative hematology ROS (+)   Anesthesia Other Findings   Reproductive/Obstetrics negative OB ROS                           Anesthesia Physical Anesthesia Plan  ASA: IV  Anesthesia Plan: General   Post-op Pain Management:    Induction: Intravenous  Airway Management Planned: Oral ETT  Additional Equipment:   Intra-op Plan:   Post-operative Plan: Extubation in OR  Informed Consent: I have reviewed the patients History and Physical, chart, labs and discussed the procedure including  the risks, benefits and alternatives for the proposed anesthesia with the patient or authorized representative who has indicated his/her understanding and acceptance.   Dental advisory given  Plan Discussed with: CRNA  Anesthesia Plan Comments: (At increased risk for cardiopulmonary complications due to co-morbidities of morbid obesity, a.fib, cirrhosis, and pulmonary htn. )        Anesthesia Quick Evaluation

## 2014-07-12 NOTE — Transfer of Care (Signed)
Immediate Anesthesia Transfer of Care Note  Patient: Albert James  Procedure(s) Performed: Procedure(s): LEFT SHOULDER MINI OPEN ROTATOR CUFF REPAIR  SUBACROMIAL DECOMPRESSION  (Left)  Patient Location: PACU  Anesthesia Type:General  Level of Consciousness: sedated  Airway & Oxygen Therapy: Patient Spontanous Breathing and Patient connected to face mask oxygen  Post-op Assessment: Report given to PACU RN and Post -op Vital signs reviewed and stable  Post vital signs: Reviewed and stable  Complications: No apparent anesthesia complications

## 2014-07-13 DIAGNOSIS — S43429A Sprain of unspecified rotator cuff capsule, initial encounter: Secondary | ICD-10-CM | POA: Diagnosis not present

## 2014-07-13 LAB — CBC
HCT: 44.2 % (ref 39.0–52.0)
HEMOGLOBIN: 14.3 g/dL (ref 13.0–17.0)
MCH: 31 pg (ref 26.0–34.0)
MCHC: 32.4 g/dL (ref 30.0–36.0)
MCV: 95.7 fL (ref 78.0–100.0)
Platelets: 106 10*3/uL — ABNORMAL LOW (ref 150–400)
RBC: 4.62 MIL/uL (ref 4.22–5.81)
RDW: 14.7 % (ref 11.5–15.5)
WBC: 7.5 10*3/uL (ref 4.0–10.5)

## 2014-07-13 LAB — BASIC METABOLIC PANEL
Anion gap: 9 (ref 5–15)
BUN: 15 mg/dL (ref 6–23)
CALCIUM: 8.8 mg/dL (ref 8.4–10.5)
CO2: 28 mEq/L (ref 19–32)
Chloride: 99 mEq/L (ref 96–112)
Creatinine, Ser: 0.83 mg/dL (ref 0.50–1.35)
GFR calc Af Amer: >90 mL/min (ref >90)
GLUCOSE: 175 mg/dL — AB (ref 70–99)
Potassium: 5.2 mEq/L (ref 3.7–5.3)
Sodium: 136 mEq/L — ABNORMAL LOW (ref 137–147)

## 2014-07-13 NOTE — Op Note (Signed)
NAME:  Albert James, Albert James NO.:  [id number]  MEDICAL RECORD NO.:  [id number]  LOCATION:  24                         FACILITY:  [facility]  PHYSICIAN:  Susa Day, M.D.    DATE OF BIRTH:  04-30-1956  DATE OF PROCEDURE:  07/12/2014 DATE OF DISCHARGE:                              OPERATIVE REPORT   PREOPERATIVE DIAGNOSES: 1. Rotator cuff tear of the left shoulder impingement syndrome. 2. Morbid obesity with a BMI of 65.  POSTOPERATIVE DIAGNOSES: 1. Rotator cuff tear of the left shoulder impingement syndrome. 2. Morbid obesity with a BMI of 65.  PROCEDURE PERFORMED: 1. Mini open rotator cuff repair of massive retracted tear of the     rotator cuff utilizing PushLock and SwiveLock suture anchors. 2. Subacromial decompression and bursectomy.  ANESTHESIA:  General.  ASSISTANT:  Cleophas Dunker, PA, who was required for patient positioning, retraction, holding of the extremity.  Technical difficulty increased due to the patient's morbid obesity with a BMI of 65.  BRIEF HISTORY:  A 58 year old male with severe shoulder pain, retracted tear of the rotator cuff, morbid obesity, failing conservative treatment, indicated for repair of torn tendon.  He had severe pain.  He did have comorbidities and underwent preoperative clearance.  We did talk about the increased risk of bleeding, infection, DVT, PE, anesthetic complications, inability to heal, etc.  TECHNIQUE:  With the patient in supine beach-chair position, the patient had to be placed on the general table. It was oversized for the shoulder holder.  We had additional staff to help  move the patient from the hospital bed to the operating room table and spent additional time securing the patient with patient's straps. We had PlexiPulse for DVT prophylaxis.  We padded all bony prominences after this.  We prepped and draped the upper extremity in the usual sterile fashion utilizing the arm holder as well.  We also  used 3 g of Kefzol IV. We spent considerable time palpating the acromion and protecting the anterolateral aspect of the acromion.  We made an incision from the anterolateral aspect the acromion over the side of the lateral aspect of the deltoid.  We did not extend beyond 4 cm off the anterolateral aspect the acromion.  We found a raphe between the anterolateral heads of the deltoid.  There was significant subcutaneous adipose tissue.  We divided in line with the skin incision.  Self-retaining retractor was placed. We identified the subacromial region.  Entered the joint, dividing the CA ligament partially. There was no significant spurring.  We used a rongeur to remove a small spur off the anterolateral aspect of the acromion.  Self-retaining retractor was noted, we evacuated the joint foot. Hypertrophic bursa was noted, and this was excised with the Metzenbaum and pickups.  The humeral head__________ was noted. We spent considerable time mobilizing the infraspinatus and supraspinatus tendon. Subscap was intact.  Significant fraying of the anterior aspect of the supraspinatus was noted and attenuation of the rotator cuff.  We mobilized the cuff.  We fashioned a trough in the lateral aspect of the humeral head closer to the articular surface.  Again, the visualization was difficult due to the patient's size.  We spent considerable time identifying  the anterior and posterior aspect of the humeral head, the bicipital groove.  We were palpating bicipital groove and lateral to that.  Posteriorly, I used an awl in the posterior aspect of the humeral head for a SwiveLock.  I used an awl and then inserted the SwiveLock, excellent resistance to pull out.  I then threaded through the posterior aspect of the infraspinatus and supraspinatus with a Scorpion suture passer utilizing  the FiberTape.  I then mobilized it anteriorly and laterally.  We then placed a second SwiveLock anchor in there.  This  was placed, anterior and lateral and threaded through the anterior and midportion of the supraspinatus.  We then advanced the cuff over the head. We had essentially full coverage.  There was some attenuation of the cuff noted.  We crossed the suture ends and placed them into 2 PushLock's over the outer aspect of the greater tuberosity, 1 posterior and 1 anterior after utilization of awl and insertion of the PushLock. Did have full coverage without undue tension.  Again, some attenuation anteriorly was noted.  We copiously irrigated the wound.  No active bleeding was noted.  I then removed the redundant suture.  Self- retaining retractors were then removed, irrigated the wound, closed the raphe with 1 Vicryl, the subcu with multiple layers of 2-0, and the skin with the subcuticular.  Sterile dressing was applied.  Placed in a sling, extubated without difficulty, and transported over to the hospital bed utilizing the air assisted transport mechanism.  He tolerated the procedure well without complications with minimal blood loss.  Again, assistant was Cleophas Dunker, [place], who was absolutely needed for closure, visualization, and moving the patient and holding the extremity.     Susa Day, M.D.     Geralynn Rile  D:  07/12/2014  T:  07/13/2014  Job:  [id number]

## 2014-07-13 NOTE — Evaluation (Signed)
Occupational Therapy Evaluation Patient Details Name: Albert James MRN: [id number] DOB: [birth year] Today's Date: 07/13/2014    History of Present Illness L RTC repair   Clinical Impression   Pt presents to OT with decreased I with ADL activity s/p L RTC repair.  Pt able to perform pendulums with OT, as well as elbow, wrist and hand ROM.  All education complete per shoulder protocal. Hand out provided    Follow Up Recommendations  Outpatient OT    Equipment Recommendations  None recommended by OT       Precautions / Restrictions Precautions Precautions: Shoulder Type of Shoulder Precautions: per shoulder protocal Restrictions Weight Bearing Restrictions: Yes Other Position/Activity Restrictions: NWB LUE      Mobility Bed Mobility Overal bed mobility: Needs Assistance Bed Mobility: Supine to Sit     Supine to sit: Min assist        Transfers Overall transfer level: Needs assistance Equipment used: 1 person hand held assist Transfers: Sit to/from Stand Sit to Stand: Min assist         General transfer comment: daugther will  A at home                         Pertinent Vitals/Pain Pain Assessment: 0-10 Pain Location: L shoulder Pain Descriptors / Indicators: Aching Pain Intervention(s): RN gave pain meds during session        Extremity/Trunk Assessment Upper Extremity Assessment Upper Extremity Assessment: LUE deficits/detail LUE Deficits / Details: s/p surgery           Communication Communication Communication: No difficulties   Cognition Arousal/Alertness: Awake/alert Behavior During Therapy: WFL for tasks assessed/performed Overall Cognitive Status: Within Functional Limits for tasks assessed                     General Comments   daughter will A as needed       Shoulder Instructions Shoulder Instructions Donning/doffing shirt without moving shoulder: Caregiver independent with task;Minimal assistance Method for  sponge bathing under operated UE: Caregiver independent with task;Minimal assistance Donning/doffing sling/immobilizer: Caregiver independent with task;Minimal assistance Correct positioning of sling/immobilizer: Caregiver independent with task;Minimal assistance Pendulum exercises (written home exercise program): Caregiver independent with task;Minimal assistance ROM for elbow, wrist and digits of operated UE: Caregiver independent with task;Minimal assistance Sling wearing schedule (on at all times/off for ADL's): Caregiver independent with task;Minimal assistance Proper positioning of operated UE when showering: Caregiver independent with task;Minimal assistance Positioning of UE while sleeping: Caregiver independent with task;Minimal assistance    Home Living Family/patient expects to be discharged to:: Private residence Living Arrangements: Children Available Help at Discharge: Family                                    Prior Functioning/Environment Level of Independence: Independent with assistive device(s)        Comments: pt used walker and cane     OT Diagnosis: Generalized weakness;Acute pain   OT Problem List: Pain;Decreased strength      OT Goals(Current goals can be found in the care plan section) Acute Rehab OT Goals Patient Stated Goal: get back home OT Goal Formulation: With patient  OT Frequency:      End of Session    Activity Tolerance: Patient tolerated treatment well Patient left: in chair;with call bell/phone within reach;with family/visitor present   Time: 0945-1000 OT Time Calculation (min):  15 min Charges:  OT General Charges $OT Visit: 1 Procedure OT Evaluation $Initial OT Evaluation Tier I: 1 Procedure OT Treatments $Self Care/Home Management : 8-22 mins G-Codes:    Betsy Pries 08/12/2014, 10:45 AM

## 2014-07-13 NOTE — Discharge Summary (Signed)
Patient ID: MEET WEATHINGTON MRN: [id number] DOB/AGE: 1956/06/07 58 y.o.  Admit date: 07/12/2014 Discharge date: 07/13/2014  Admission Diagnoses:  Principal Problem:   Complete rotator cuff tear of left shoulder Active Problems:   Complete rotator cuff tear   Discharge Diagnoses:  Same  Past Medical History  Diagnosis Date  . Ulcer   . Varicose veins   . Arthritis     lower back  . Back pain   . Knee pain   . Shoulder pain   . nhl; dx'd 2006    chemo comp, lymphoma  . Peripheral vascular disease   . Anxiety   . Depression   . Cirrhosis     hx of   . Non Hodgkin's lymphoma     hx of   . Atrial fibrillation, persistent 07/08/2014    Surgeries: Procedure(s): LEFT SHOULDER MINI OPEN ROTATOR CUFF REPAIR  SUBACROMIAL DECOMPRESSION  on 07/12/2014   Consultants:    Discharged Condition: Improved  Hospital Course: Albert James is an 58 y.o. male who was admitted 07/12/2014 for operative treatment ofComplete rotator cuff tear of left shoulder. Patient has severe unremitting pain that affects sleep, daily activities, and work/hobbies. After pre-op clearance the patient was taken to the operating room on 07/12/2014 and underwent  Procedure(s): LEFT SHOULDER MINI OPEN ROTATOR CUFF REPAIR  SUBACROMIAL DECOMPRESSION .    Patient was given perioperative antibiotics: Anti-infectives   Start     Dose/Rate Route Frequency Ordered Stop   07/12/14 1700  ceFAZolin (ANCEF) IVPB 2 g/50 mL premix     2 g 100 mL/hr over 30 Minutes Intravenous Every 6 hours 07/12/14 1427 07/13/14 0621   07/12/14 1136  polymyxin B 500,000 Units, bacitracin 50,000 Units in sodium chloride irrigation 0.9 % 500 mL irrigation  Status:  Discontinued       As needed 07/12/14 1137 07/12/14 1225   07/12/14 0600  ceFAZolin (ANCEF) 3 g in dextrose 5 % 50 mL IVPB     3 g 160 mL/hr over 30 Minutes Intravenous On call to O.R. 07/11/14 1539 07/12/14 1106       Patient was given sequential compression devices,  early ambulation, and chemoprophylaxis to prevent DVT.  Patient benefited maximally from hospital stay and there were no complications.    Recent vital signs: Patient Vitals for the past 24 hrs:  BP Temp Temp src Pulse Resp SpO2  07/13/14 0535 149/96 mmHg 97.6 F (36.4 C) Oral 63 20 95 %  07/13/14 0100 113/65 mmHg 98.1 F (36.7 C) Oral 60 18 91 %  07/12/14 2103 105/73 mmHg 97.9 F (36.6 C) - 61 18 90 %  07/12/14 1715 110/61 mmHg 98.5 F (36.9 C) - 64 18 92 %  07/12/14 1610 106/65 mmHg 97.7 F (36.5 C) - 63 16 91 %  07/12/14 1516 127/80 mmHg 98.6 F (37 C) - 70 18 92 %  07/12/14 1412 140/71 mmHg 98 F (36.7 C) - 67 16 95 %  07/12/14 1353 - - - 61 16 98 %  07/12/14 1345 109/48 mmHg - - 65 16 98 %  07/12/14 1330 108/53 mmHg 97.9 F (36.6 C) - 60 16 98 %  07/12/14 1315 114/60 mmHg - - 70 15 96 %  07/12/14 1300 112/48 mmHg - - 73 15 99 %  07/12/14 1255 - - - 57 16 99 %  07/12/14 1245 125/66 mmHg - - 70 15 97 %  07/12/14 1233 129/79 mmHg 97.7 F (36.5 C) - 81 19 95 %  07/12/14 1232 - - - 74 15 94 %     Recent laboratory studies:  Recent Labs  07/12/14 1507 07/13/14 0518  WBC 5.0 7.5  HGB 14.4 14.3  HCT 44.7 44.2  PLT 92* 106*  NA  --  136*  K  --  5.2  CL  --  99  CO2  --  28  BUN  --  15  CREATININE 0.98 0.83  GLUCOSE  --  175*  CALCIUM  --  8.8     Discharge Medications:     Medication List    STOP taking these medications       etodolac 400 MG tablet  Commonly known as:  LODINE      TAKE these medications       ALDACTONE 100 MG tablet  Generic drug:  spironolactone  Take 100 mg by mouth 2 (two) times daily.     B-complex with vitamin C tablet  Take 1 tablet by mouth daily.     docusate sodium 100 MG capsule  Commonly known as:  COLACE  Take 1 capsule (100 mg total) by mouth 2 (two) times daily as needed for mild constipation.     Fish Oil 1000 MG Caps  Take by mouth 2 (two) times daily.     fluticasone 50 MCG/ACT nasal spray  Commonly known  as:  FLONASE  Place 1 spray into the nose as needed for allergies.     furosemide 40 MG tablet  Commonly known as:  LASIX  Take 40 mg by mouth 2 (two) times daily.     glucosamine-chondroitin 500-400 MG tablet  Take 2 tablets by mouth 2 (two) times daily.     lactulose 10 GM/15ML solution  Commonly known as:  [place]  Take by mouth as needed.     methocarbamol 500 MG tablet  Commonly known as:  ROBAXIN  Take 1 tablet (500 mg total) by mouth 3 (three) times daily.     nadolol 20 MG tablet  Commonly known as:  CORGARD  Take 20 mg by mouth every morning.     OxyCODONE 40 mg T12a 12 hr tablet  Commonly known as:  OXYCONTIN  Take 1 tablet (40 mg total) by mouth every 12 (twelve) hours.     oxyCODONE-acetaminophen 7.5-325 MG per tablet  Commonly known as:  PERCOCET  Take 1-2 tablets by mouth every 4 (four) hours as needed for pain.        Diagnostic Studies: Dg Chest 2 View  07/02/2014   CLINICAL DATA:  History of hypertension, preop for surgery on torn rotator cuff  EXAM: CHEST  2 VIEW  COMPARISON:  Chest x-ray of 05/25/2010  FINDINGS: No active infiltrate or effusion is seen. Mediastinal and hilar contours are unchanged. The heart is mildly enlarged and unchanged. No bony abnormality is seen.  IMPRESSION: Stable cardiomegaly.  No active lung disease.   Electronically Signed   By: Ivar Drape M.D.   On: 07/02/2014 13:56    Disposition:       Discharge Instructions   Call MD / Call 911    Complete by:  As directed   If you experience chest pain or shortness of breath, CALL 911 and be transported to the hospital emergency room.  If you develope a fever above 101 F, pus (white drainage) or increased drainage or redness at the wound, or calf pain, call your surgeon's office.     Constipation Prevention    Complete by:  As  directed   Drink plenty of fluids.  Prune juice may be helpful.  You may use a stool softener, such as Colace (over the counter) 100 mg twice a day.  Use  MiraLax (over the counter) for constipation as needed.     Diet - low sodium heart healthy    Complete by:  As directed      Increase activity slowly as tolerated    Complete by:  As directed            Follow-up Information   Follow up with Hasset Chaviano C, MD In 2 weeks.   Specialty:  Orthopedic Surgery   Contact information:   [address] [place] [address] [phone]        Signed: Johnn Hai 07/13/2014, 9:04 AM

## 2014-07-13 NOTE — Progress Notes (Signed)
Patient has discharge order to home.  Reviewed discharge paperwork with patient, patient verbalized understanding.  Prescriptions given to patient.  Patient escorted from floor with NT via wheelchair.  Christen Bame RN

## 2014-07-13 NOTE — Progress Notes (Signed)
Subjective: 1 Day Post-Op Procedure(s) (LRB): LEFT SHOULDER MINI OPEN ROTATOR CUFF REPAIR  SUBACROMIAL DECOMPRESSION  (Left) Patient reports pain as 3 on 0-10 scale.    Objective: Vital signs in last 24 hours: Temp:  [97.6 F (36.4 C)-98.6 F (37 C)] 97.6 F (36.4 C) (08/29 0535) Pulse Rate:  [57-81] 63 (08/29 0535) Resp:  [15-20] 20 (08/29 0535) BP: (105-149)/(48-96) 149/96 mmHg (08/29 0535) SpO2:  [90 %-99 %] 95 % (08/29 0535)  Intake/Output from previous day: 08/28 0701 - 08/29 0700 In: 1595 [P.O.:240; I.V.:1300; IV Piggyback:55] Out: 2000 [Urine:2000] Intake/Output this shift:     Recent Labs  07/12/14 1507 07/13/14 0518  HGB 14.4 14.3    Recent Labs  07/12/14 1507 07/13/14 0518  WBC 5.0 7.5  RBC 4.61 4.62  HCT 44.7 44.2  PLT 92* 106*    Recent Labs  07/12/14 1507 07/13/14 0518  NA  --  136*  K  --  5.2  CL  --  99  CO2  --  28  BUN  --  15  CREATININE 0.98 0.83  GLUCOSE  --  175*  CALCIUM  --  8.8   No results found for this basename: LABPT, INR,  in the last 72 hours  Neurologically intact Sensation intact distally Intact pulses distally Incision: dressing C/D/I  Assessment/Plan: 1 Day Post-Op Procedure(s) (LRB): LEFT SHOULDER MINI OPEN ROTATOR CUFF REPAIR  SUBACROMIAL DECOMPRESSION  (Left) Advance diet Up with therapy D/C IV fluids Discharge home with home health D/C instructions given  Shanina Kepple C 07/13/2014, 9:01 AM

## 2014-07-15 ENCOUNTER — Encounter (HOSPITAL_COMMUNITY): Payer: Self-pay | Admitting: Specialist

## 2014-07-18 ENCOUNTER — Encounter: Payer: Self-pay | Admitting: Hematology & Oncology

## 2014-07-18 ENCOUNTER — Other Ambulatory Visit (HOSPITAL_BASED_OUTPATIENT_CLINIC_OR_DEPARTMENT_OTHER): Payer: 59 | Admitting: Lab

## 2014-07-18 ENCOUNTER — Encounter: Payer: Self-pay | Admitting: Dietician

## 2014-07-18 ENCOUNTER — Ambulatory Visit (HOSPITAL_BASED_OUTPATIENT_CLINIC_OR_DEPARTMENT_OTHER): Payer: 59 | Admitting: Hematology & Oncology

## 2014-07-18 ENCOUNTER — Encounter: Payer: 59 | Attending: Family Medicine | Admitting: Dietician

## 2014-07-18 VITALS — BP 150/94 | HR 66 | Temp 98.2°F | Resp 18 | Ht 69.0 in | Wt >= 6400 oz

## 2014-07-18 DIAGNOSIS — Z6841 Body Mass Index (BMI) 40.0 and over, adult: Secondary | ICD-10-CM | POA: Insufficient documentation

## 2014-07-18 DIAGNOSIS — M4620 Osteomyelitis of vertebra, site unspecified: Secondary | ICD-10-CM

## 2014-07-18 DIAGNOSIS — Z713 Dietary counseling and surveillance: Secondary | ICD-10-CM | POA: Insufficient documentation

## 2014-07-18 DIAGNOSIS — E669 Obesity, unspecified: Secondary | ICD-10-CM | POA: Insufficient documentation

## 2014-07-18 DIAGNOSIS — Z8572 Personal history of non-Hodgkin lymphomas: Secondary | ICD-10-CM

## 2014-07-18 DIAGNOSIS — I890 Lymphedema, not elsewhere classified: Secondary | ICD-10-CM

## 2014-07-18 DIAGNOSIS — M549 Dorsalgia, unspecified: Principal | ICD-10-CM

## 2014-07-18 DIAGNOSIS — C8299 Follicular lymphoma, unspecified, extranodal and solid organ sites: Secondary | ICD-10-CM

## 2014-07-18 DIAGNOSIS — Z87898 Personal history of other specified conditions: Secondary | ICD-10-CM

## 2014-07-18 DIAGNOSIS — G8929 Other chronic pain: Secondary | ICD-10-CM

## 2014-07-18 LAB — CBC WITH DIFFERENTIAL (CANCER CENTER ONLY)
BASO#: 0 10*3/uL (ref 0.0–0.2)
BASO%: 0.2 % (ref 0.0–2.0)
EOS%: 3.5 % (ref 0.0–7.0)
Eosinophils Absolute: 0.2 10*3/uL (ref 0.0–0.5)
HEMATOCRIT: 44.8 % (ref 38.7–49.9)
HGB: 14.8 g/dL (ref 13.0–17.1)
LYMPH#: 1.4 10*3/uL (ref 0.9–3.3)
LYMPH%: 23.1 % (ref 14.0–48.0)
MCH: 31.7 pg (ref 28.0–33.4)
MCHC: 33 g/dL (ref 32.0–35.9)
MCV: 96 fL (ref 82–98)
MONO#: 0.5 10*3/uL (ref 0.1–0.9)
MONO%: 8.4 % (ref 0.0–13.0)
NEUT#: 3.9 10*3/uL (ref 1.5–6.5)
NEUT%: 64.8 % (ref 40.0–80.0)
PLATELETS: 109 10*3/uL — AB (ref 145–400)
RBC: 4.67 10*6/uL (ref 4.20–5.70)
RDW: 14.9 % (ref 11.1–15.7)
WBC: 5.9 10*3/uL (ref 4.0–10.0)

## 2014-07-18 LAB — CMP (CANCER CENTER ONLY)
ALK PHOS: 68 U/L (ref 26–84)
ALT(SGPT): 26 U/L (ref 10–47)
AST: 20 U/L (ref 11–38)
Albumin: 3.5 g/dL (ref 3.3–5.5)
BILIRUBIN TOTAL: 0.7 mg/dL (ref 0.20–1.60)
BUN, Bld: 15 mg/dL (ref 7–22)
CALCIUM: 9.5 mg/dL (ref 8.0–10.3)
CHLORIDE: 97 meq/L — AB (ref 98–108)
CO2: 32 mEq/L (ref 18–33)
CREATININE: 1.1 mg/dL (ref 0.6–1.2)
Glucose, Bld: 108 mg/dL (ref 73–118)
Potassium: 4.4 mEq/L (ref 3.3–4.7)
Sodium: 146 mEq/L — ABNORMAL HIGH (ref 128–145)
Total Protein: 6.4 g/dL (ref 6.4–8.1)

## 2014-07-18 LAB — LACTATE DEHYDROGENASE: LDH: 174 U/L (ref 94–250)

## 2014-07-18 NOTE — Progress Notes (Signed)
  Medical Nutrition Therapy:  Appt start time: 1100 end time:  3838.   Assessment:  Primary concerns today: Mr. Hightower is here with his daughter. He is here to discuss his weight. Brandun lives with his daughter and she does the food shopping. He is mainly homebound, as his mobility is very limited. He and his daughter live on a "mini farm" with animals (chickens and cows) and a vegetable garden. He reports he has lost weight many times in the past but has been unable to sustain weight loss. [place] reports that he follows a low sodium diet by avoiding adding salt to foods and limiting packaged foods. He seems very resistant to change.  Preferred Learning Style:  No preference indicated   Learning Readiness:   Not ready   MEDICATIONS: see list   DIETARY INTAKE:  24-hr recall:  B ( AM): 2 cinnamon raisin bagels with cream cheese and sometimes eggs  Snk ( AM):   L ( PM): 2 bologna and cheese sandwiches with mayo and mustard Snk ( PM): chex mix or chips or 4-6 oz cheese D ( PM): frozen pizza, hot dogs, hamburger helper, or takeout Snk ( PM): apple or cheese or chex mix  Beverages: 1/2 sweet 1/2 unsweet tea  Usual physical activity: none, knee pain  Estimated energy needs: 1800-2000 calories 200-225 g carbohydrates  Progress Towards Goal(s):  No progress.   Nutritional Diagnosis:  [place]-3.3 Overweight/obesity As related to physical inactivity, large portion sizes, and inappropriate food choices.  As evidenced by obese status.    Intervention:  Nutrition counseling provided. -Watch portions  -Limit starches   -Try having 1 bagel for breakfast  -Pre portion meals and snacks  -Pay attention to serving size food labels  -Fill up on non-starchy vegetables (any veggie that's not corn, peas, or potatoes)  -Add vegetables to any meal  -Veggies are good raw or cooked  -Things to add to the grocery list  -Light stick cheese, fresh or frozen vegetables, fruits   -Snacks: light  stick cheese, vegetables, boiled eggs, fruit, popcorn with salt substitute  -Use olive oil to cook foods  Teaching Method Utilized:  Ship broker  Handouts given during visit include:  MyPlate  Chair Exercises  Barriers to learning/adherence to lifestyle change: limited mobility  Demonstrated degree of understanding via:  Teach Back   Monitoring/Evaluation:  Dietary intake, exercise, and body weight prn.

## 2014-07-18 NOTE — Patient Instructions (Addendum)
-  Watch portions  -Limit starches   -Try having 1 bagel for breakfast  -Pre portion meals and snacks  -Pay attention to serving size food labels  -Fill up on non-starchy vegetables (any veggie that's not corn, peas, or potatoes)  -Add vegetables to any meal  -Veggies are good raw or cooked  -Things to add to the grocery list  -Light stick cheese, fresh or frozen vegetables, fruits   -Snacks: light stick cheese, vegetables, boiled eggs, fruit, popcorn with salt substitute  -Use olive oil to cook foods

## 2014-07-19 NOTE — Progress Notes (Signed)
Hematology and Oncology Follow Up Visit  Albert James [id number] 1956-04-15 58 y.o. 07/19/2014   Principle Diagnosis:  1. Follicular small cell non-Hodgkin lymphoma, clinical remission. 2. Morbid obesity. 3. Lymphedema of his legs. 4. Vertebral osteomyelitis.   Current Therapy:    Observation     Interim History:  Mr.  James is back for followup. We saw last saw him back in March. He underwent left rotator cuff surgery. This was just a week or so ago. He actually poor where the rotator cuff muscles. He's be in a sling and immobilizer for several weeks.  He also had some type of vein procedure on his left leg. This was earlier in the summer time.  He does have some wraps on his lower legs. He has put about lymphedema.  He is no longer working. He is on total this ability.  He's had no fever. He's had no cough. He's had no change in bowel or bladder habits.  He did have arterial studies done of his lower legs in June. There is some calcifications in the right tibial artery.  A chest x-ray done in mid August was normal. There was some slight cardiomegaly.  He is on pain medication. He's doing pretty well with the OxyContin and oxycodone.    Medications: Current outpatient prescriptions:B Complex-C (B-COMPLEX WITH VITAMIN C) tablet, Take 1 tablet by mouth daily., Disp: , Rfl: ;  docusate sodium (COLACE) 100 MG capsule, Take 1 capsule (100 mg total) by mouth 2 (two) times daily as needed for mild constipation., Disp: 20 capsule, Rfl: 1;  etodolac (LODINE) 400 MG tablet, Take 400 mg by mouth 2 (two) times daily., Disp: , Rfl:  fluticasone (FLONASE) 50 MCG/ACT nasal spray, Place 1 spray into the nose as needed for allergies. , Disp: , Rfl: ;  furosemide (LASIX) 40 MG tablet, Take 40 mg by mouth 2 (two) times daily.  , Disp: , Rfl: ;  glucosamine-chondroitin 500-400 MG tablet, Take 2 tablets by mouth 2 (two) times daily., Disp: , Rfl:  OxyCODONE (OXYCONTIN) 20 mg T12A 12 hr  tablet, Take 20 mg by mouth every 12 (twelve) hours. Pt states he takes 40 mg tab and a 20 mg tab, Disp: , Rfl: ;  OxyCODONE (OXYCONTIN) 40 mg T12A 12 hr tablet, Take 40 mg by mouth every 12 (twelve) hours., Disp: , Rfl: ;  lactulose (CHRONULAC) 10 GM/15ML solution, Take by mouth as needed. , Disp: , Rfl:  methocarbamol (ROBAXIN) 500 MG tablet, Take 1 tablet (500 mg total) by mouth 3 (three) times daily., Disp: 60 tablet, Rfl: 1;  nadolol (CORGARD) 20 MG tablet, Take 20 mg by mouth every morning. , Disp: , Rfl: ;  Omega-3 Fatty Acids (FISH OIL) 1000 MG CAPS, Take by mouth 2 (two) times daily., Disp: , Rfl: ;  spironolactone (ALDACTONE) 100 MG tablet, Take 100 mg by mouth 2 (two) times daily.  , Disp: , Rfl:   Allergies:  Allergies  Allergen Reactions  . Vancomycin Other (See Comments)    "red man" syndrome (when in large quantity)    Past Medical History, Surgical history, Social history, and Family History were reviewed and updated.  Review of Systems: As above  Physical Exam:  height is 5' 9"  (1.753 m) and weight is 484 lb (219.541 kg). His oral temperature is 98.2 F (36.8 C). His blood pressure is 150/94 and his pulse is 66. His respiration is 18.   Morbidly obese white gentleman. Head and neck exam shows no ocular  or oral lesions. He has no palpable cervical or supraclavicular lymph nodes. Lungs are clear. Cardiac exam regular in rhythm with no murmurs rubs or bruits. Abdomen is soft. His morbidly obese. There is no fluid wave. There is no palpable liver or spleen tip. Extremities shows wrappings on his lower legs. He has no ecchymoses. He has no phlebitic issues. He does have a shoulder harness for the left arm. Back exam shows no tenderness over the spine. Neurological exam shows no focal neurological deficits. Skin exam no rashes, ecchymoses or petechia.  Lab Results  Component Value Date   WBC 5.9 07/18/2014   HGB 14.8 07/18/2014   HCT 44.8 07/18/2014   MCV 96 07/18/2014   PLT 109*  07/18/2014     Chemistry      Component Value Date/Time   NA 146* 07/18/2014 1350   NA 136* 07/13/2014 0518   K 4.4 07/18/2014 1350   K 5.2 07/13/2014 0518   CL 97* 07/18/2014 1350   CL 99 07/13/2014 0518   CO2 32 07/18/2014 1350   CO2 28 07/13/2014 0518   BUN 15 07/18/2014 1350   BUN 15 07/13/2014 0518   CREATININE 1.1 07/18/2014 1350   CREATININE 0.83 07/13/2014 0518      Component Value Date/Time   CALCIUM 9.5 07/18/2014 1350   CALCIUM 8.8 07/13/2014 0518   ALKPHOS 68 07/18/2014 1350   ALKPHOS 84 07/02/2014 1320   AST 20 07/18/2014 1350   AST 29 07/02/2014 1320   ALT 26 07/18/2014 1350   ALT 22 07/02/2014 1320   BILITOT 0.70 07/18/2014 1350   BILITOT 0.7 07/02/2014 1320         Impression and Plan: Albert James is 58 year old gentleman. He has had a history of follicular lymphoma. This was probably 8-10 years ago. Again, this has not been a problem for him.  He has all these other health issues. I really do not think that lymphoma is every going to be a problem for him.   He now has had surgery for his left rotator cuff.  I think that we will plan to get him back in 6 months.   Volanda Napoleon, MD 9/4/20157:28 AM

## 2014-08-01 ENCOUNTER — Other Ambulatory Visit: Payer: Self-pay | Admitting: *Deleted

## 2014-08-01 MED ORDER — OXYCODONE HCL 20 MG PO TABS: 20.0000 mg | ORAL_TABLET | Freq: Three times a day (TID) | ORAL | Status: DC | PRN

## 2014-08-01 MED ORDER — OXYCODONE HCL ER 40 MG PO T12A: 40.0000 mg | EXTENDED_RELEASE_TABLET | Freq: Two times a day (BID) | ORAL | Status: DC

## 2014-08-28 ENCOUNTER — Other Ambulatory Visit: Payer: Self-pay | Admitting: *Deleted

## 2014-08-28 MED ORDER — OXYCODONE HCL ER 40 MG PO T12A: 40.0000 mg | EXTENDED_RELEASE_TABLET | Freq: Two times a day (BID) | ORAL | Status: DC

## 2014-08-28 MED ORDER — OXYCODONE HCL 20 MG PO TABS: 20.0000 mg | ORAL_TABLET | Freq: Three times a day (TID) | ORAL | Status: DC | PRN

## 2014-09-24 ENCOUNTER — Other Ambulatory Visit: Payer: Self-pay | Admitting: *Deleted

## 2014-09-24 DIAGNOSIS — G8929 Other chronic pain: Secondary | ICD-10-CM

## 2014-09-24 DIAGNOSIS — M549 Dorsalgia, unspecified: Principal | ICD-10-CM

## 2014-09-24 MED ORDER — OXYCODONE HCL 20 MG PO TABS: 20.0000 mg | ORAL_TABLET | Freq: Three times a day (TID) | ORAL | Status: DC | PRN

## 2014-09-24 MED ORDER — OXYCODONE HCL ER 40 MG PO T12A: 40.0000 mg | EXTENDED_RELEASE_TABLET | Freq: Two times a day (BID) | ORAL | Status: DC

## 2014-09-30 ENCOUNTER — Telehealth: Payer: Self-pay | Admitting: Hematology & Oncology

## 2014-09-30 NOTE — Telephone Encounter (Signed)
Faxed [employer] disability papers today to: [phone]   Claim: [id number] Policy: [id number]  Requested  from 04/15/2014- to current      COPY SCANNED

## 2014-10-07 ENCOUNTER — Encounter (HOSPITAL_COMMUNITY): Payer: Self-pay | Admitting: *Deleted

## 2014-10-08 ENCOUNTER — Encounter (HOSPITAL_COMMUNITY): Payer: Self-pay | Admitting: *Deleted

## 2014-10-23 ENCOUNTER — Other Ambulatory Visit: Payer: Self-pay | Admitting: Gastroenterology

## 2014-10-23 NOTE — Anesthesia Preprocedure Evaluation (Addendum)
Anesthesia Evaluation  Patient identified by MRN, date of birth, ID band Patient awake    Reviewed: Allergy & Precautions, H&P , NPO status , Patient's Chart, lab work & pertinent test results  History of Anesthesia Complications Negative for: history of anesthetic complications  Airway Mallampati: III  TM Distance: >3 FB Neck ROM: Full    Dental no notable dental hx. (+) Dental Advisory Given   Pulmonary sleep apnea , former smoker,  breath sounds clear to auscultation  Pulmonary exam normal       Cardiovascular + Peripheral Vascular Disease + dysrhythmias Atrial Fibrillation Rhythm:Regular Rate:Normal  Cleared in August for rotator cuff surgery by cardiology and no changes in health since that. Echo performed at that time indicated LVH, EF 55-60% and signs of moderate pulmonary HTN   Neuro/Psych PSYCHIATRIC DISORDERS Anxiety Depression negative neurological ROS     GI/Hepatic negative GI ROS, (+) Cirrhosis -      ,   Endo/Other  Morbid obesity (BMI 71.44)  Renal/GU negative Renal ROS  negative genitourinary   Musculoskeletal  (+) Arthritis -, Osteoarthritis,    Abdominal (+) + obese,   Peds negative pediatric ROS (+)  Hematology negative hematology ROS (+)   Anesthesia Other Findings Hx of nonhodkins lymphoma  Reproductive/Obstetrics negative OB ROS                          Anesthesia Physical Anesthesia Plan  ASA: IV  Anesthesia Plan: MAC   Post-op Pain Management:    Induction: Intravenous  Airway Management Planned: Nasal Cannula  Additional Equipment:   Intra-op Plan:   Post-operative Plan: Extubation in OR  Informed Consent: I have reviewed the patients History and Physical, chart, labs and discussed the procedure including the risks, benefits and alternatives for the proposed anesthesia with the patient or authorized representative who has indicated his/her  understanding and acceptance.   Dental advisory given  Plan Discussed with: CRNA  Anesthesia Plan Comments:         Anesthesia Quick Evaluation

## 2014-10-23 NOTE — Addendum Note (Signed)
Addended by: Wilford Corner on: 10/23/2014 04:16 PM   Modules accepted: Orders

## 2014-10-24 ENCOUNTER — Encounter (HOSPITAL_COMMUNITY): Payer: Self-pay

## 2014-10-24 ENCOUNTER — Ambulatory Visit (HOSPITAL_COMMUNITY): Payer: 59 | Admitting: Anesthesiology

## 2014-10-24 ENCOUNTER — Ambulatory Visit (HOSPITAL_COMMUNITY)
Admission: RE | Admit: 2014-10-24 | Discharge: 2014-10-24 | Disposition: A | Discharge status: Home / Self Care | Payer: 59 | Source: Ambulatory Visit | Attending: Gastroenterology | Admitting: Gastroenterology

## 2014-10-24 ENCOUNTER — Encounter (HOSPITAL_COMMUNITY)
Admission: RE | Disposition: A | Discharge status: Home / Self Care | Payer: Self-pay | Source: Ambulatory Visit | Attending: Gastroenterology

## 2014-10-24 DIAGNOSIS — M1990 Unspecified osteoarthritis, unspecified site: Secondary | ICD-10-CM | POA: Insufficient documentation

## 2014-10-24 DIAGNOSIS — Z6841 Body Mass Index (BMI) 40.0 and over, adult: Secondary | ICD-10-CM | POA: Insufficient documentation

## 2014-10-24 DIAGNOSIS — K621 Rectal polyp: Secondary | ICD-10-CM | POA: Insufficient documentation

## 2014-10-24 DIAGNOSIS — I272 Other secondary pulmonary hypertension: Secondary | ICD-10-CM | POA: Insufficient documentation

## 2014-10-24 DIAGNOSIS — Z1211 Encounter for screening for malignant neoplasm of colon: Secondary | ICD-10-CM

## 2014-10-24 DIAGNOSIS — K7460 Unspecified cirrhosis of liver: Secondary | ICD-10-CM | POA: Insufficient documentation

## 2014-10-24 DIAGNOSIS — F418 Other specified anxiety disorders: Secondary | ICD-10-CM | POA: Insufficient documentation

## 2014-10-24 DIAGNOSIS — I739 Peripheral vascular disease, unspecified: Secondary | ICD-10-CM | POA: Diagnosis not present

## 2014-10-24 DIAGNOSIS — I4891 Unspecified atrial fibrillation: Secondary | ICD-10-CM | POA: Insufficient documentation

## 2014-10-24 DIAGNOSIS — Z87891 Personal history of nicotine dependence: Secondary | ICD-10-CM | POA: Insufficient documentation

## 2014-10-24 DIAGNOSIS — K648 Other hemorrhoids: Secondary | ICD-10-CM | POA: Insufficient documentation

## 2014-10-24 HISTORY — DX: Unspecified abdominal hernia without obstruction or gangrene: K46.9

## 2014-10-24 HISTORY — DX: Other specified soft tissue disorders: M79.89

## 2014-10-24 HISTORY — PX: COLONOSCOPY WITH PROPOFOL: SHX5780

## 2014-10-24 SURGERY — COLONOSCOPY WITH PROPOFOL
Anesthesia: Monitor Anesthesia Care

## 2014-10-24 MED ORDER — PHENYLEPHRINE HCL 10 MG/ML IJ SOLN
INTRAMUSCULAR | Status: DC | PRN
  Administered 2014-10-24: 80 ug via INTRAVENOUS

## 2014-10-24 MED ORDER — LACTATED RINGERS IV SOLN: INTRAVENOUS | Status: DC | PRN

## 2014-10-24 MED ORDER — LIDOCAINE HCL (CARDIAC) 20 MG/ML IV SOLN
INTRAVENOUS | Status: DC | PRN
  Administered 2014-10-24: 100 mg via INTRAVENOUS

## 2014-10-24 MED ORDER — PROPOFOL 10 MG/ML IV BOLUS
INTRAVENOUS | Status: AC
  Filled 2014-10-24: qty 20

## 2014-10-24 MED ORDER — LIDOCAINE HCL (CARDIAC) 20 MG/ML IV SOLN
INTRAVENOUS | Status: AC
  Filled 2014-10-24: qty 5

## 2014-10-24 MED ORDER — PROPOFOL 10 MG/ML IV BOLUS
INTRAVENOUS | Status: AC
Start: 2014-10-24 — End: 2014-10-24
  Filled 2014-10-24: qty 20

## 2014-10-24 MED ORDER — PROPOFOL INFUSION 10 MG/ML OPTIME
INTRAVENOUS | Status: DC | PRN
  Administered 2014-10-24: 200 ug/kg/min via INTRAVENOUS

## 2014-10-24 MED ORDER — SODIUM CHLORIDE 0.9 % IV SOLN: INTRAVENOUS | Status: DC

## 2014-10-24 MED ORDER — LACTATED RINGERS IV SOLN
INTRAVENOUS | Status: DC
  Administered 2014-10-24: 1000 mL via INTRAVENOUS

## 2014-10-24 MED ORDER — LACTATED RINGERS IV SOLN
INTRAVENOUS | Status: DC | PRN
  Administered 2014-10-24: 09:00:00 via INTRAVENOUS

## 2014-10-24 SURGICAL SUPPLY — 22 items

## 2014-10-24 NOTE — Discharge Instructions (Addendum)
No aspirin products for the next 3 days. Will call you when the pathology results are complete.   Colonoscopy, Care After These instructions give you information on caring for yourself after your procedure. Your doctor may also give you more specific instructions. Call your doctor if you have any problems or questions after your procedure. HOME CARE  Do not drive for 24 hours.  Do not sign important papers or use machinery for 24 hours.  You may shower.  You may go back to your usual activities, but go slower for the first 24 hours.  Take rest breaks often during the first 24 hours.  Walk around or use warm packs on your belly (abdomen) if you have belly cramping or gas.  Drink enough fluids to keep your pee (urine) clear or pale yellow.  Resume your normal diet. Avoid heavy or fried foods.  Avoid drinking alcohol for 24 hours or as told by your doctor.  Only take medicines as told by your doctor. If a tissue sample (biopsy) was taken during the procedure:   Do not take aspirin or blood thinners for 7 days, or as told by your doctor.  Do not drink alcohol for 7 days, or as told by your doctor.  Eat soft foods for the first 24 hours. GET HELP IF: You still have a small amount of blood in your poop (stool) 2-3 days after the procedure. GET HELP RIGHT AWAY IF:  You have more than a small amount of blood in your poop.  You see clumps of tissue (blood clots) in your poop.  Your belly is puffy (swollen).  You feel sick to your stomach (nauseous) or throw up (vomit).  You have a fever.  You have belly pain that gets worse and medicine does not help. MAKE SURE YOU:  Understand these instructions.  Will watch your condition.  Will get help right away if you are not doing well or get worse. Document Released: 12/04/2010 Document Revised: 11/06/2013 Document Reviewed: 07/09/2013 [facility] Patient Information 2015 [place], [place]. This information is not intended to replace  advice given to you by your health care provider. Make sure you discuss any questions you have with your health care provider.   Conscious Sedation, Adult, Care After Refer to this sheet in the next few weeks. These instructions provide you with information on caring for yourself after your procedure. Your health care provider may also give you more specific instructions. Your treatment has been planned according to current medical practices, but problems sometimes occur. Call your health care provider if you have any problems or questions after your procedure. WHAT TO EXPECT AFTER THE PROCEDURE  After your procedure:  You may feel sleepy, clumsy, and have poor balance for several hours.  Vomiting may occur if you eat too soon after the procedure. HOME CARE INSTRUCTIONS  Do not participate in any activities where you could become injured for at least 24 hours. Do not:  Drive.  Swim.  Ride a bicycle.  Operate heavy machinery.  Cook.  Use power tools.  Climb ladders.  Work from a high place.  Do not make important decisions or sign legal documents until you are improved.  If you vomit, drink water, juice, or soup when you can drink without vomiting. Make sure you have little or no nausea before eating solid foods.  Only take over-the-counter or prescription medicines for pain, discomfort, or fever as directed by your health care provider.  Make sure you and your family fully  understand everything about the medicines given to you, including what side effects may occur.  You should not drink alcohol, take sleeping pills, or take medicines that cause drowsiness for at least 24 hours.  If you smoke, do not smoke without supervision.  If you are feeling better, you may resume normal activities 24 hours after you were sedated.  Keep all appointments with your health care provider. SEEK MEDICAL CARE IF:  Your skin is pale or bluish in color.  You continue to feel nauseous or  vomit.  Your pain is getting worse and is not helped by medicine.  You have bleeding or swelling.  You are still sleepy or feeling clumsy after 24 hours. SEEK IMMEDIATE MEDICAL CARE IF:  You develop a rash.  You have difficulty breathing.  You develop any type of allergic problem.  You have a fever. MAKE SURE YOU:  Understand these instructions.  Will watch your condition.  Will get help right away if you are not doing well or get worse. Document Released: 08/22/2013 Document Reviewed: 08/22/2013 [facility] Patient Information 2015 [place], [place]. This information is not intended to replace advice given to you by your health care provider. Make sure you discuss any questions you have with your health care provider.

## 2014-10-24 NOTE — Anesthesia Postprocedure Evaluation (Signed)
  Anesthesia Post-op Note  Patient: Albert James  Procedure(s) Performed: Procedure(s) (LRB): COLONOSCOPY WITH PROPOFOL (N/A)  Patient Location: PACU  Anesthesia Type: MAC  Level of Consciousness: awake and alert   Airway and Oxygen Therapy: Patient Spontanous Breathing  Post-op Pain: mild  Post-op Assessment: Post-op Vital signs reviewed, Patient's Cardiovascular Status Stable, Respiratory Function Stable, Patent Airway and No signs of Nausea or vomiting  Last Vitals:  Filed Vitals:   10/24/14 1055  BP: 122/67  Pulse: 40  Temp:   Resp: 21    Post-op Vital Signs: stable   Complications: No apparent anesthesia complications

## 2014-10-24 NOTE — Transfer of Care (Signed)
Immediate Anesthesia Transfer of Care Note  Patient: Albert James  Procedure(s) Performed: Procedure(s): COLONOSCOPY WITH PROPOFOL (N/A)  Patient Location: PACU  Anesthesia Type:MAC  Level of Consciousness: awake, alert , oriented and patient cooperative  Airway & Oxygen Therapy: Patient Spontanous Breathing and Patient connected to face mask oxygen  Post-op Assessment: Report given to PACU RN, Post -op Vital signs reviewed and stable and Patient moving all extremities X 4  Post vital signs: stable  Complications: No apparent anesthesia complications

## 2014-10-24 NOTE — H&P (Signed)
  Date of Initial H&P: 10/22/14  History reviewed, patient examined, no change in status, stable for surgery.

## 2014-10-24 NOTE — Op Note (Signed)
[facility] [place], [address]   COLONOSCOPY PROCEDURE REPORT     EXAM DATE: 30-Oct-2014  PATIENT NAME:      Albert James, Albert James           MR #: [id number] BIRTHDATE:       03/24/1956      VISIT #:     [phone]  ATTENDING:     Wilford Corner, MD     STATUS:     outpatient REFERRING MD: ASA CLASS:        Class IV  INDICATIONS:  The patient is a 58 yr old male here for a colonoscopy due to average risk for colon cancer. PROCEDURE PERFORMED:     Colonoscopy with biopsy MEDICATIONS:     Monitored anesthesia care and Per Anesthesia  ESTIMATED BLOOD LOSS:     None  CONSENT: The patient understands the risks and benefits of the procedure and understands that these risks include, but are not limited to: sedation, allergic reaction, infection, perforation and/or bleeding. Alternative means of evaluation and treatment include, among others: physical exam, x-rays, and/or surgical intervention. The patient elects to proceed with this endoscopic procedure.  DESCRIPTION OF PROCEDURE: During intra-op preparation period all mechanical & medical equipment was checked for proper function. Hand hygiene and appropriate measures for infection prevention was taken. After the risks, benefits and alternatives of the procedure were thoroughly explained, Informed consent was verified, confirmed and timeout was successfully executed by the treatment team. A digital exam revealed no abnormalities of the rectum.      The Pentax Ped Colon [id number] endoscope was introduced through the anus and advanced to the cecum, which was identified by both the appendix and ileocecal valve. The prep was fair.. The instrument was then slowly withdrawn as the colon was fully examined. Small flat polyp seen in the rectum removed with cold biopsy forceps. No other mucosal abnormalities seen.      Retroflexed views revealed small internal hemorrhoids.  The scope was  then completely withdrawn from the patient and the procedure terminated.      ADVERSE EVENTS:      There were no immediate complications.   IMPRESSIONS:     Small rectal polyp removed with cold biopsy Small internal hemorrhoids  RECOMMENDATIONS:     F/U on path; No aspirin products for 3 days; Repeat colon pending path results   Wilford Corner, MD eSigned:  Wilford Corner, MD 10-30-2014 10:44 AM   cc:  CPT CODES: ICD CODES:  The ICD and CPT codes recommended by this software are interpretations from the data that the clinical staff has captured with the software.  The verification of the translation of this report to the ICD and CPT codes and modifiers is the sole responsibility of the health care institution and practicing physician where this report was generated.  National. will not be held responsible for the validity of the ICD and CPT codes included on this report.  AMA assumes no liability for data contained or not contained herein. CPT is a Designer, television/film set of the [employer].

## 2014-10-24 NOTE — Interval H&P Note (Signed)
History and Physical Interval Note:  10/24/2014 8:17 AM  Albert James  has presented today for surgery, with the diagnosis of screening  The various methods of treatment have been discussed with the patient and family. After consideration of risks, benefits and other options for treatment, the patient has consented to  Procedure(s): COLONOSCOPY WITH PROPOFOL (N/A) as a surgical intervention .  The patient's history has been reviewed, patient examined, no change in status, stable for surgery.  I have reviewed the patient's chart and labs.  Questions were answered to the patient's satisfaction.     [place] C.

## 2014-10-25 ENCOUNTER — Encounter (HOSPITAL_COMMUNITY): Payer: Self-pay | Admitting: Gastroenterology

## 2014-10-29 ENCOUNTER — Other Ambulatory Visit: Payer: Self-pay | Admitting: Nurse Practitioner

## 2014-10-29 DIAGNOSIS — G8929 Other chronic pain: Secondary | ICD-10-CM

## 2014-10-29 DIAGNOSIS — M549 Dorsalgia, unspecified: Principal | ICD-10-CM

## 2014-10-29 MED ORDER — OXYCODONE HCL 20 MG PO TABS: 20.0000 mg | ORAL_TABLET | Freq: Three times a day (TID) | ORAL | Status: DC | PRN

## 2014-10-29 MED ORDER — OXYCODONE HCL ER 40 MG PO T12A: 40.0000 mg | EXTENDED_RELEASE_TABLET | Freq: Two times a day (BID) | ORAL | Status: DC

## 2014-11-27 ENCOUNTER — Other Ambulatory Visit: Payer: Self-pay | Admitting: Nurse Practitioner

## 2014-11-27 DIAGNOSIS — G8929 Other chronic pain: Secondary | ICD-10-CM

## 2014-11-27 DIAGNOSIS — M549 Dorsalgia, unspecified: Principal | ICD-10-CM

## 2014-11-27 MED ORDER — OXYCODONE HCL ER 40 MG PO T12A: 40.0000 mg | EXTENDED_RELEASE_TABLET | Freq: Two times a day (BID) | ORAL | Status: DC

## 2014-11-27 MED ORDER — OXYCODONE HCL 20 MG PO TABS: 20.0000 mg | ORAL_TABLET | Freq: Three times a day (TID) | ORAL | Status: DC | PRN

## 2014-12-30 ENCOUNTER — Other Ambulatory Visit: Payer: Self-pay | Admitting: *Deleted

## 2014-12-30 DIAGNOSIS — M549 Dorsalgia, unspecified: Principal | ICD-10-CM

## 2014-12-30 DIAGNOSIS — G8929 Other chronic pain: Secondary | ICD-10-CM

## 2014-12-30 MED ORDER — OXYCODONE HCL ER 40 MG PO T12A: 40.0000 mg | EXTENDED_RELEASE_TABLET | Freq: Two times a day (BID) | ORAL | Status: DC

## 2014-12-30 MED ORDER — OXYCODONE HCL 20 MG PO TABS: 20.0000 mg | ORAL_TABLET | Freq: Three times a day (TID) | ORAL | Status: DC | PRN

## 2015-01-16 ENCOUNTER — Ambulatory Visit (HOSPITAL_BASED_OUTPATIENT_CLINIC_OR_DEPARTMENT_OTHER): Payer: 59 | Admitting: Hematology & Oncology

## 2015-01-16 ENCOUNTER — Other Ambulatory Visit (HOSPITAL_BASED_OUTPATIENT_CLINIC_OR_DEPARTMENT_OTHER): Payer: 59 | Admitting: Lab

## 2015-01-16 ENCOUNTER — Encounter: Payer: Self-pay | Admitting: Hematology & Oncology

## 2015-01-16 VITALS — BP 121/62 | HR 81 | Temp 98.3°F | Resp 20 | Ht 71.0 in | Wt >= 6400 oz

## 2015-01-16 DIAGNOSIS — C8290 Follicular lymphoma, unspecified, unspecified site: Secondary | ICD-10-CM

## 2015-01-16 DIAGNOSIS — Z8579 Personal history of other malignant neoplasms of lymphoid, hematopoietic and related tissues: Secondary | ICD-10-CM

## 2015-01-16 DIAGNOSIS — Z8572 Personal history of non-Hodgkin lymphomas: Secondary | ICD-10-CM

## 2015-01-16 DIAGNOSIS — M4620 Osteomyelitis of vertebra, site unspecified: Secondary | ICD-10-CM

## 2015-01-16 LAB — CMP (CANCER CENTER ONLY)
ALBUMIN: 3.6 g/dL (ref 3.3–5.5)
ALK PHOS: 74 U/L (ref 26–84)
ALT(SGPT): 19 U/L (ref 10–47)
AST: 17 U/L (ref 11–38)
BILIRUBIN TOTAL: 0.7 mg/dL (ref 0.20–1.60)
BUN: 13 mg/dL (ref 7–22)
CO2: 32 mEq/L (ref 18–33)
Calcium: 9.3 mg/dL (ref 8.0–10.3)
Chloride: 102 mEq/L (ref 98–108)
Creat: 1.1 mg/dl (ref 0.6–1.2)
GLUCOSE: 132 mg/dL — AB (ref 73–118)
Potassium: 4.1 mEq/L (ref 3.3–4.7)
Sodium: 145 mEq/L (ref 128–145)
Total Protein: 6.3 g/dL — ABNORMAL LOW (ref 6.4–8.1)

## 2015-01-16 LAB — CBC WITH DIFFERENTIAL (CANCER CENTER ONLY)
BASO#: 0 10*3/uL (ref 0.0–0.2)
BASO%: 0.2 % (ref 0.0–2.0)
EOS ABS: 0.1 10*3/uL (ref 0.0–0.5)
EOS%: 2.5 % (ref 0.0–7.0)
HEMATOCRIT: 44.5 % (ref 38.7–49.9)
HGB: 14.5 g/dL (ref 13.0–17.1)
LYMPH#: 1.5 10*3/uL (ref 0.9–3.3)
LYMPH%: 27.6 % (ref 14.0–48.0)
MCH: 31.4 pg (ref 28.0–33.4)
MCHC: 32.6 g/dL (ref 32.0–35.9)
MCV: 96 fL (ref 82–98)
MONO#: 0.5 10*3/uL (ref 0.1–0.9)
MONO%: 8.9 % (ref 0.0–13.0)
NEUT%: 60.8 % (ref 40.0–80.0)
NEUTROS ABS: 3.2 10*3/uL (ref 1.5–6.5)
PLATELETS: 95 10*3/uL — AB (ref 145–400)
RBC: 4.62 10*6/uL (ref 4.20–5.70)
RDW: 14.3 % (ref 11.1–15.7)
WBC: 5.3 10*3/uL (ref 4.0–10.0)

## 2015-01-16 LAB — LACTATE DEHYDROGENASE: LDH: 144 U/L (ref 94–250)

## 2015-01-16 NOTE — Progress Notes (Signed)
Hematology and Oncology Follow Up Visit  Albert James [id number] 1956-01-24 59 y.o. 01/16/2015   Principle Diagnosis:  1. Follicular small cell non-Hodgkin lymphoma, clinical remission. 2. Morbid obesity. 3. Lymphedema of his legs. 4. Vertebral osteomyelitis.   Current Therapy:    Observation     Interim History:  [name] is back for followup. We saw last saw him back in September He underwent left rotator cuff surgery. He recovered from this pretty nicely. He did have some cardiac arrhythmia after the procedure. Again, this was not been a problem for him. If he has lost some weight. He's try to lose some weight. He is morbidly obese. He wants try to lose some weight and try to help with his mobility.  He is is still on disability. I much or if he were ever be able to work again.  He's had no bleeding. He has had no change in bowel or bladder habits.  He has had no problems with rashes.   He is on pain medication. He's doing pretty well with the OxyContin and oxycodone.    Medications:  Current outpatient prescriptions:  .  B Complex-C (B-COMPLEX WITH VITAMIN C) tablet, Take 1 tablet by mouth every morning. , Disp: , Rfl:  .  docusate sodium (COLACE) 100 MG capsule, Take 1 capsule (100 mg total) by mouth 2 (two) times daily as needed for mild constipation., Disp: 20 capsule, Rfl: 1 .  etodolac (LODINE) 400 MG tablet, Take 400 mg by mouth 2 (two) times daily., Disp: , Rfl:  .  fluticasone (FLONASE) 50 MCG/ACT nasal spray, Place 1 spray into the nose as needed for allergies. , Disp: , Rfl:  .  furosemide (LASIX) 40 MG tablet, Take 40 mg by mouth 2 (two) times daily.  , Disp: , Rfl:  .  glucosamine-chondroitin 500-400 MG tablet, Take 2 tablets by mouth 2 (two) times daily., Disp: , Rfl:  .  lactulose (CHRONULAC) 10 GM/15ML solution, Take 10 g by mouth as needed for mild constipation. , Disp: , Rfl:  .  nadolol (CORGARD) 20 MG tablet, Take 20 mg by mouth every  morning. , Disp: , Rfl:  .  Omega-3 Fatty Acids (FISH OIL) 1000 MG CAPS, Take 1 capsule by mouth 2 (two) times daily. , Disp: , Rfl:  .  OxyCODONE (OXYCONTIN) 40 mg T12A 12 hr tablet, Take 1 tablet (40 mg total) by mouth every 12 (twelve) hours., Disp: 60 tablet, Rfl: 0 .  Oxycodone HCl 20 MG TABS, Take 1 tablet (20 mg total) by mouth every 8 (eight) hours as needed., Disp: 90 tablet, Rfl: 0 .  spironolactone (ALDACTONE) 100 MG tablet, Take 100 mg by mouth 2 (two) times daily.  , Disp: , Rfl:   Allergies:  Allergies  Allergen Reactions  . Vancomycin Other (See Comments)    "red man" syndrome (when in large quantity)    Past Medical History, Surgical history, Social history, and Family History were reviewed and updated.  Review of Systems: As above  Physical Exam:  height is 5' 11"  (1.803 m) and weight is 465 lb (210.923 kg). His oral temperature is 98.3 F (36.8 C). His blood pressure is 121/62 and his pulse is 81. His respiration is 20.   Morbidly obese white gentleman. Head and neck exam shows no ocular or oral lesions. He has no palpable cervical or supraclavicular lymph nodes. Lungs are clear. Cardiac exam regular rate and rhythm with no murmurs rubs or bruits. Abdomen is soft.  His morbidly obese. There is no fluid wave. There is no palpable liver or spleen tip. Extremities shows improvement in the edema on his lower legs. He has no ecchymoses. He has no phlebitic issues. He does have a shoulder harness for the left arm. Back exam shows no tenderness over the spine. Neurological exam shows no focal neurological deficits. Skin exam no rashes, ecchymoses or petechia.  Lab Results  Component Value Date   WBC 5.3 01/16/2015   HGB 14.5 01/16/2015   HCT 44.5 01/16/2015   MCV 96 01/16/2015   PLT 95* 01/16/2015     Chemistry      Component Value Date/Time   NA 145 01/16/2015 1451   NA 136* 07/13/2014 0518   K 4.1 01/16/2015 1451   K 5.2 07/13/2014 0518   CL 102 01/16/2015 1451    CL 99 07/13/2014 0518   CO2 32 01/16/2015 1451   CO2 28 07/13/2014 0518   BUN 13 01/16/2015 1451   BUN 15 07/13/2014 0518   CREATININE 1.1 01/16/2015 1451   CREATININE 0.83 07/13/2014 0518      Component Value Date/Time   CALCIUM 9.3 01/16/2015 1451   CALCIUM 8.8 07/13/2014 0518   ALKPHOS 74 01/16/2015 1451   ALKPHOS 84 07/02/2014 1320   AST 17 01/16/2015 1451   AST 29 07/02/2014 1320   ALT 19 01/16/2015 1451   ALT 22 07/02/2014 1320   BILITOT 0.70 01/16/2015 1451   BILITOT 0.7 07/02/2014 1320         Impression and Plan: [name] is 59 year old gentleman. He has had a history of follicular lymphoma. This was probably 10 years ago. Again, this has not been a problem for him.  He has all these other health issues. I really do not think that lymphoma is every going to be a problem for him.   I think that we will plan to get him back in 6 months.   Volanda Napoleon, MD 3/3/20164:10 PM

## 2015-01-27 ENCOUNTER — Other Ambulatory Visit: Payer: Self-pay | Admitting: *Deleted

## 2015-01-27 DIAGNOSIS — M549 Dorsalgia, unspecified: Principal | ICD-10-CM

## 2015-01-27 DIAGNOSIS — G8929 Other chronic pain: Secondary | ICD-10-CM

## 2015-01-27 MED ORDER — OXYCODONE HCL 20 MG PO TABS: 20.0000 mg | ORAL_TABLET | Freq: Three times a day (TID) | ORAL | Status: DC | PRN

## 2015-01-27 MED ORDER — OXYCODONE HCL ER 40 MG PO T12A: 40.0000 mg | EXTENDED_RELEASE_TABLET | Freq: Two times a day (BID) | ORAL | Status: DC

## 2015-02-24 ENCOUNTER — Other Ambulatory Visit: Payer: Self-pay

## 2015-02-24 DIAGNOSIS — G8929 Other chronic pain: Secondary | ICD-10-CM

## 2015-02-24 DIAGNOSIS — M549 Dorsalgia, unspecified: Principal | ICD-10-CM

## 2015-02-24 MED ORDER — OXYCODONE HCL 20 MG PO TABS: 20.0000 mg | ORAL_TABLET | Freq: Three times a day (TID) | ORAL | Status: DC | PRN

## 2015-02-24 MED ORDER — OXYCODONE HCL ER 40 MG PO T12A: 40.0000 mg | EXTENDED_RELEASE_TABLET | Freq: Two times a day (BID) | ORAL | Status: DC

## 2015-03-26 ENCOUNTER — Other Ambulatory Visit: Payer: Self-pay | Admitting: *Deleted

## 2015-03-26 DIAGNOSIS — M549 Dorsalgia, unspecified: Principal | ICD-10-CM

## 2015-03-26 DIAGNOSIS — G8929 Other chronic pain: Secondary | ICD-10-CM

## 2015-03-26 MED ORDER — OXYCODONE HCL ER 40 MG PO T12A: 40.0000 mg | EXTENDED_RELEASE_TABLET | Freq: Two times a day (BID) | ORAL | Status: DC

## 2015-03-26 MED ORDER — OXYCODONE HCL 20 MG PO TABS: 20.0000 mg | ORAL_TABLET | Freq: Three times a day (TID) | ORAL | Status: DC | PRN

## 2015-04-29 ENCOUNTER — Other Ambulatory Visit: Payer: Self-pay | Admitting: *Deleted

## 2015-04-29 DIAGNOSIS — G8929 Other chronic pain: Secondary | ICD-10-CM

## 2015-04-29 DIAGNOSIS — M549 Dorsalgia, unspecified: Principal | ICD-10-CM

## 2015-04-29 MED ORDER — OXYCODONE HCL ER 40 MG PO T12A: 40.0000 mg | EXTENDED_RELEASE_TABLET | Freq: Two times a day (BID) | ORAL | Status: DC

## 2015-04-29 MED ORDER — OXYCODONE HCL 20 MG PO TABS: 20.0000 mg | ORAL_TABLET | Freq: Three times a day (TID) | ORAL | Status: DC | PRN

## 2015-04-30 ENCOUNTER — Telehealth: Payer: Self-pay

## 2015-04-30 NOTE — Telephone Encounter (Signed)
Albert James [phone]   [name] called to give [place] his new insurance information, still [facility] Member ID # [id number] - Group # [id number]

## 2015-05-06 ENCOUNTER — Telehealth: Payer: Self-pay | Admitting: Hematology & Oncology

## 2015-05-06 NOTE — Telephone Encounter (Signed)
Albert James has [place]  and is good until 05/05/2016.    P: [phone] F: [phone]      COPY SCANNED

## 2015-05-26 ENCOUNTER — Other Ambulatory Visit: Payer: Self-pay | Admitting: *Deleted

## 2015-05-26 DIAGNOSIS — G8929 Other chronic pain: Secondary | ICD-10-CM

## 2015-05-26 DIAGNOSIS — M549 Dorsalgia, unspecified: Principal | ICD-10-CM

## 2015-05-26 MED ORDER — OXYCODONE HCL 20 MG PO TABS: 20.0000 mg | ORAL_TABLET | Freq: Three times a day (TID) | ORAL | Status: DC | PRN

## 2015-05-26 MED ORDER — OXYCODONE HCL ER 40 MG PO T12A: 40.0000 mg | EXTENDED_RELEASE_TABLET | Freq: Two times a day (BID) | ORAL | Status: DC

## 2015-06-27 ENCOUNTER — Other Ambulatory Visit: Payer: Self-pay | Admitting: *Deleted

## 2015-06-27 DIAGNOSIS — M549 Dorsalgia, unspecified: Principal | ICD-10-CM

## 2015-06-27 DIAGNOSIS — G8929 Other chronic pain: Secondary | ICD-10-CM

## 2015-06-27 MED ORDER — OXYCODONE HCL 20 MG PO TABS: 20.0000 mg | ORAL_TABLET | Freq: Three times a day (TID) | ORAL | Status: DC | PRN

## 2015-06-27 MED ORDER — OXYCODONE HCL ER 40 MG PO T12A: 40.0000 mg | EXTENDED_RELEASE_TABLET | Freq: Two times a day (BID) | ORAL | Status: DC

## 2015-07-23 ENCOUNTER — Encounter: Payer: Self-pay | Admitting: Hematology & Oncology

## 2015-07-23 ENCOUNTER — Other Ambulatory Visit (HOSPITAL_BASED_OUTPATIENT_CLINIC_OR_DEPARTMENT_OTHER): Payer: 59

## 2015-07-23 ENCOUNTER — Ambulatory Visit (HOSPITAL_BASED_OUTPATIENT_CLINIC_OR_DEPARTMENT_OTHER): Payer: 59 | Admitting: Hematology & Oncology

## 2015-07-23 VITALS — BP 120/89 | HR 73 | Temp 98.4°F | Ht 71.0 in | Wt >= 6400 oz

## 2015-07-23 DIAGNOSIS — Z8572 Personal history of non-Hodgkin lymphomas: Secondary | ICD-10-CM | POA: Diagnosis not present

## 2015-07-23 DIAGNOSIS — C8290 Follicular lymphoma, unspecified, unspecified site: Secondary | ICD-10-CM

## 2015-07-23 DIAGNOSIS — M4620 Osteomyelitis of vertebra, site unspecified: Secondary | ICD-10-CM

## 2015-07-23 DIAGNOSIS — I890 Lymphedema, not elsewhere classified: Secondary | ICD-10-CM

## 2015-07-23 DIAGNOSIS — G8929 Other chronic pain: Secondary | ICD-10-CM

## 2015-07-23 DIAGNOSIS — M549 Dorsalgia, unspecified: Principal | ICD-10-CM

## 2015-07-23 LAB — COMPREHENSIVE METABOLIC PANEL
ALT: 13 U/L (ref 9–46)
AST: 16 U/L (ref 10–35)
Albumin: 4 g/dL (ref 3.6–5.1)
Alkaline Phosphatase: 70 U/L (ref 40–115)
BUN: 12 mg/dL (ref 7–25)
CO2: 28 mmol/L (ref 20–31)
Calcium: 9.1 mg/dL (ref 8.6–10.3)
Chloride: 102 mmol/L (ref 98–110)
Creatinine, Ser: 0.97 mg/dL (ref 0.70–1.25)
GLUCOSE: 84 mg/dL (ref 65–99)
POTASSIUM: 4.3 mmol/L (ref 3.5–5.3)
Sodium: 142 mmol/L (ref 135–146)
Total Bilirubin: 0.7 mg/dL (ref 0.2–1.2)
Total Protein: 6.2 g/dL (ref 6.1–8.1)

## 2015-07-23 LAB — CBC WITH DIFFERENTIAL (CANCER CENTER ONLY)
BASO#: 0 10*3/uL (ref 0.0–0.2)
BASO%: 0.2 % (ref 0.0–2.0)
EOS%: 1.8 % (ref 0.0–7.0)
Eosinophils Absolute: 0.1 10*3/uL (ref 0.0–0.5)
HCT: 43.9 % (ref 38.7–49.9)
HGB: 14.1 g/dL (ref 13.0–17.1)
LYMPH#: 2.1 10*3/uL (ref 0.9–3.3)
LYMPH%: 38.2 % (ref 14.0–48.0)
MCH: 30.6 pg (ref 28.0–33.4)
MCHC: 32.1 g/dL (ref 32.0–35.9)
MCV: 95 fL (ref 82–98)
MONO#: 0.6 10*3/uL (ref 0.1–0.9)
MONO%: 10.8 % (ref 0.0–13.0)
NEUT#: 2.7 10*3/uL (ref 1.5–6.5)
NEUT%: 49 % (ref 40.0–80.0)
PLATELETS: 112 10*3/uL — AB (ref 145–400)
RBC: 4.61 10*6/uL (ref 4.20–5.70)
RDW: 15.1 % (ref 11.1–15.7)
WBC: 5.5 10*3/uL (ref 4.0–10.0)

## 2015-07-23 LAB — CHCC SATELLITE - SMEAR

## 2015-07-23 MED ORDER — OXYCODONE HCL 20 MG PO TABS: 20.0000 mg | ORAL_TABLET | Freq: Three times a day (TID) | ORAL | Status: DC | PRN

## 2015-07-23 MED ORDER — OXYCODONE HCL ER 40 MG PO T12A
40.0000 mg | EXTENDED_RELEASE_TABLET | Freq: Two times a day (BID) | ORAL | Status: DC
Start: 2015-07-23 — End: 2015-08-26

## 2015-07-23 NOTE — Progress Notes (Signed)
Hematology and Oncology Follow Up Visit  Albert James [id number] [birth year] 59 y.o. 07/23/2015   Principle Diagnosis:  1. Follicular small cell non-Hodgkin lymphoma, clinical remission. 2. Morbid obesity. 3. Lymphedema of his legs. 4. Vertebral osteomyelitis.   Current Therapy:    Observation     Interim History:  Mr.  James is back for followup. We saw last saw him back in March. He still is bothered quite a bit by his weight. He is really bothered by his niece. He is bothered by his back. He really has a very hard time getting around now. Per [facility] ways quite a bit. Of the way zone was 450 pounds. He really cannot lose weight because he really cannot exercise.   He underwent left rotator cuff surgery back about a year ago. He has recovered from this pretty nicely. He did have some cardiac arrhythmia after the procedure. Again, this was not been a problem for him. If he has lost some weight. He's try to lose some weight. He is morbidly obese. He wants try to lose some weight and try to help with his mobility.  He did not do much over the summer. [place] He has quite a few animals. He is trying to help take care of the animals.  His sister actually lives next-door to me. Sometimes he goes over to visit. When is over there and I see him, then we chitchat.   He is on pain medication. He's doing pretty well with the OxyContin and oxycodone.    Medications:  Current outpatient prescriptions:  .  B Complex-C (B-COMPLEX WITH VITAMIN C) tablet, Take 1 tablet by mouth every morning. , Disp: , Rfl:  .  docusate sodium (COLACE) 100 MG capsule, Take 1 capsule (100 mg total) by mouth 2 (two) times daily as needed for mild constipation., Disp: 20 capsule, Rfl: 1 .  etodolac (LODINE) 400 MG tablet, Take 400 mg by mouth 2 (two) times daily., Disp: , Rfl:  .  fluticasone (FLONASE) 50 MCG/ACT nasal spray, Place 1 spray into the nose as needed for allergies. , Disp: ,  Rfl:  .  furosemide (LASIX) 40 MG tablet, Take 40 mg by mouth 2 (two) times daily.  , Disp: , Rfl:  .  glucosamine-chondroitin 500-400 MG tablet, Take 2 tablets by mouth 2 (two) times daily., Disp: , Rfl:  .  lactulose (CHRONULAC) 10 GM/15ML solution, Take 10 g by mouth as needed for mild constipation. , Disp: , Rfl:  .  levothyroxine (SYNTHROID, LEVOTHROID) 50 MCG tablet, TK 1 T PO D, Disp: , Rfl: 3 .  nadolol (CORGARD) 20 MG tablet, Take 20 mg by mouth every morning. , Disp: , Rfl:  .  Omega-3 Fatty Acids (FISH OIL) 1000 MG CAPS, Take 1 capsule by mouth 2 (two) times daily. , Disp: , Rfl:  .  OxyCODONE (OXYCONTIN) 40 mg T12A 12 hr tablet, Take 1 tablet (40 mg total) by mouth every 12 (twelve) hours., Disp: 60 tablet, Rfl: 0 .  Oxycodone HCl 20 MG TABS, Take 1 tablet (20 mg total) by mouth every 8 (eight) hours as needed., Disp: 90 tablet, Rfl: 0 .  spironolactone (ALDACTONE) 100 MG tablet, Take 100 mg by mouth 2 (two) times daily.  , Disp: , Rfl:  .  VOLTAREN 1 % GEL, APPLY 1 GEL AS DIRECTED 4 ITMES A DAY, Disp: , Rfl: 2  Allergies:  Allergies  Allergen Reactions  . Vancomycin Other (See Comments)    "  red man" syndrome (when in large quantity)    Past Medical History, Surgical history, Social history, and Family History were reviewed and updated.  Review of Systems: As above  Physical Exam:  height is 5' 11"  (1.803 m) and weight is 443 lb (200.943 kg). His oral temperature is 98.4 F (36.9 C). His blood pressure is 120/89 and his pulse is 73.   Morbidly obese white gentleman. Head and neck exam shows no ocular or oral lesions. He has no palpable cervical or supraclavicular lymph nodes. Lungs are clear. Cardiac exam regular rate and rhythm with no murmurs rubs or bruits. Abdomen is soft. His morbidly obese. There is no fluid wave. There is no palpable liver or spleen tip. Extremities shows improvement in the edema on his lower legs. He has no ecchymoses. He has no phlebitic issues. He  does have a shoulder harness for the left arm. Back exam shows no tenderness over the spine. Neurological exam shows no focal neurological deficits. Skin exam no rashes, ecchymoses or petechia.  Lab Results  Component Value Date   WBC 5.5 07/23/2015   HGB 14.1 07/23/2015   HCT 43.9 07/23/2015   MCV 95 07/23/2015   PLT 112* 07/23/2015     Chemistry      Component Value Date/Time   NA 145 01/16/2015 1451   NA 136* 07/13/2014 0518   K 4.1 01/16/2015 1451   K 5.2 07/13/2014 0518   CL 102 01/16/2015 1451   CL 99 07/13/2014 0518   CO2 32 01/16/2015 1451   CO2 28 07/13/2014 0518   BUN 13 01/16/2015 1451   BUN 15 07/13/2014 0518   CREATININE 1.1 01/16/2015 1451   CREATININE 0.83 07/13/2014 0518      Component Value Date/Time   CALCIUM 9.3 01/16/2015 1451   CALCIUM 8.8 07/13/2014 0518   ALKPHOS 74 01/16/2015 1451   ALKPHOS 84 07/02/2014 1320   AST 17 01/16/2015 1451   AST 29 07/02/2014 1320   ALT 19 01/16/2015 1451   ALT 22 07/02/2014 1320   BILITOT 0.70 01/16/2015 1451   BILITOT 0.7 07/02/2014 1320         Impression and Plan: Albert James is 59 year old gentleman. He has had a history of follicular lymphoma. This was probably 10 -11 years ago. Again, this has not been a problem for him.  He has all these other health issues. I really do not think that lymphoma is every going to be a problem for him.   I think that we will plan to get him back in 6 months.   Albert Napoleon, MD 9/7/20165:17 PM

## 2015-07-24 LAB — VITAMIN D 25 HYDROXY (VIT D DEFICIENCY, FRACTURES): Vit D, 25-Hydroxy: 21 ng/mL — ABNORMAL LOW (ref 30–100)

## 2015-07-24 LAB — LACTATE DEHYDROGENASE: LDH: 141 U/L (ref 94–250)

## 2015-08-26 ENCOUNTER — Other Ambulatory Visit: Payer: Self-pay | Admitting: *Deleted

## 2015-08-26 DIAGNOSIS — Z8572 Personal history of non-Hodgkin lymphomas: Secondary | ICD-10-CM

## 2015-08-26 DIAGNOSIS — G8929 Other chronic pain: Secondary | ICD-10-CM

## 2015-08-26 DIAGNOSIS — M549 Dorsalgia, unspecified: Principal | ICD-10-CM

## 2015-08-27 ENCOUNTER — Other Ambulatory Visit: Payer: Self-pay

## 2015-08-27 DIAGNOSIS — M549 Dorsalgia, unspecified: Principal | ICD-10-CM

## 2015-08-27 DIAGNOSIS — Z8572 Personal history of non-Hodgkin lymphomas: Secondary | ICD-10-CM

## 2015-08-27 DIAGNOSIS — G8929 Other chronic pain: Secondary | ICD-10-CM

## 2015-08-27 MED ORDER — OXYCODONE HCL ER 40 MG PO T12A: 40.0000 mg | EXTENDED_RELEASE_TABLET | Freq: Two times a day (BID) | ORAL | Status: DC

## 2015-08-27 MED ORDER — OXYCODONE HCL 20 MG PO TABS: 20.0000 mg | ORAL_TABLET | Freq: Three times a day (TID) | ORAL | Status: DC | PRN

## 2015-09-26 IMAGING — CR DG CHEST 2V
2 series · 2 of 2 positions shown · non-contrast
Comparison: Chest x-ray of 05/25/2010

CLINICAL DATA: History of hypertension, preop for surgery on torn
rotator cuff

EXAM:
CHEST  2 VIEW

[w chest pa *]
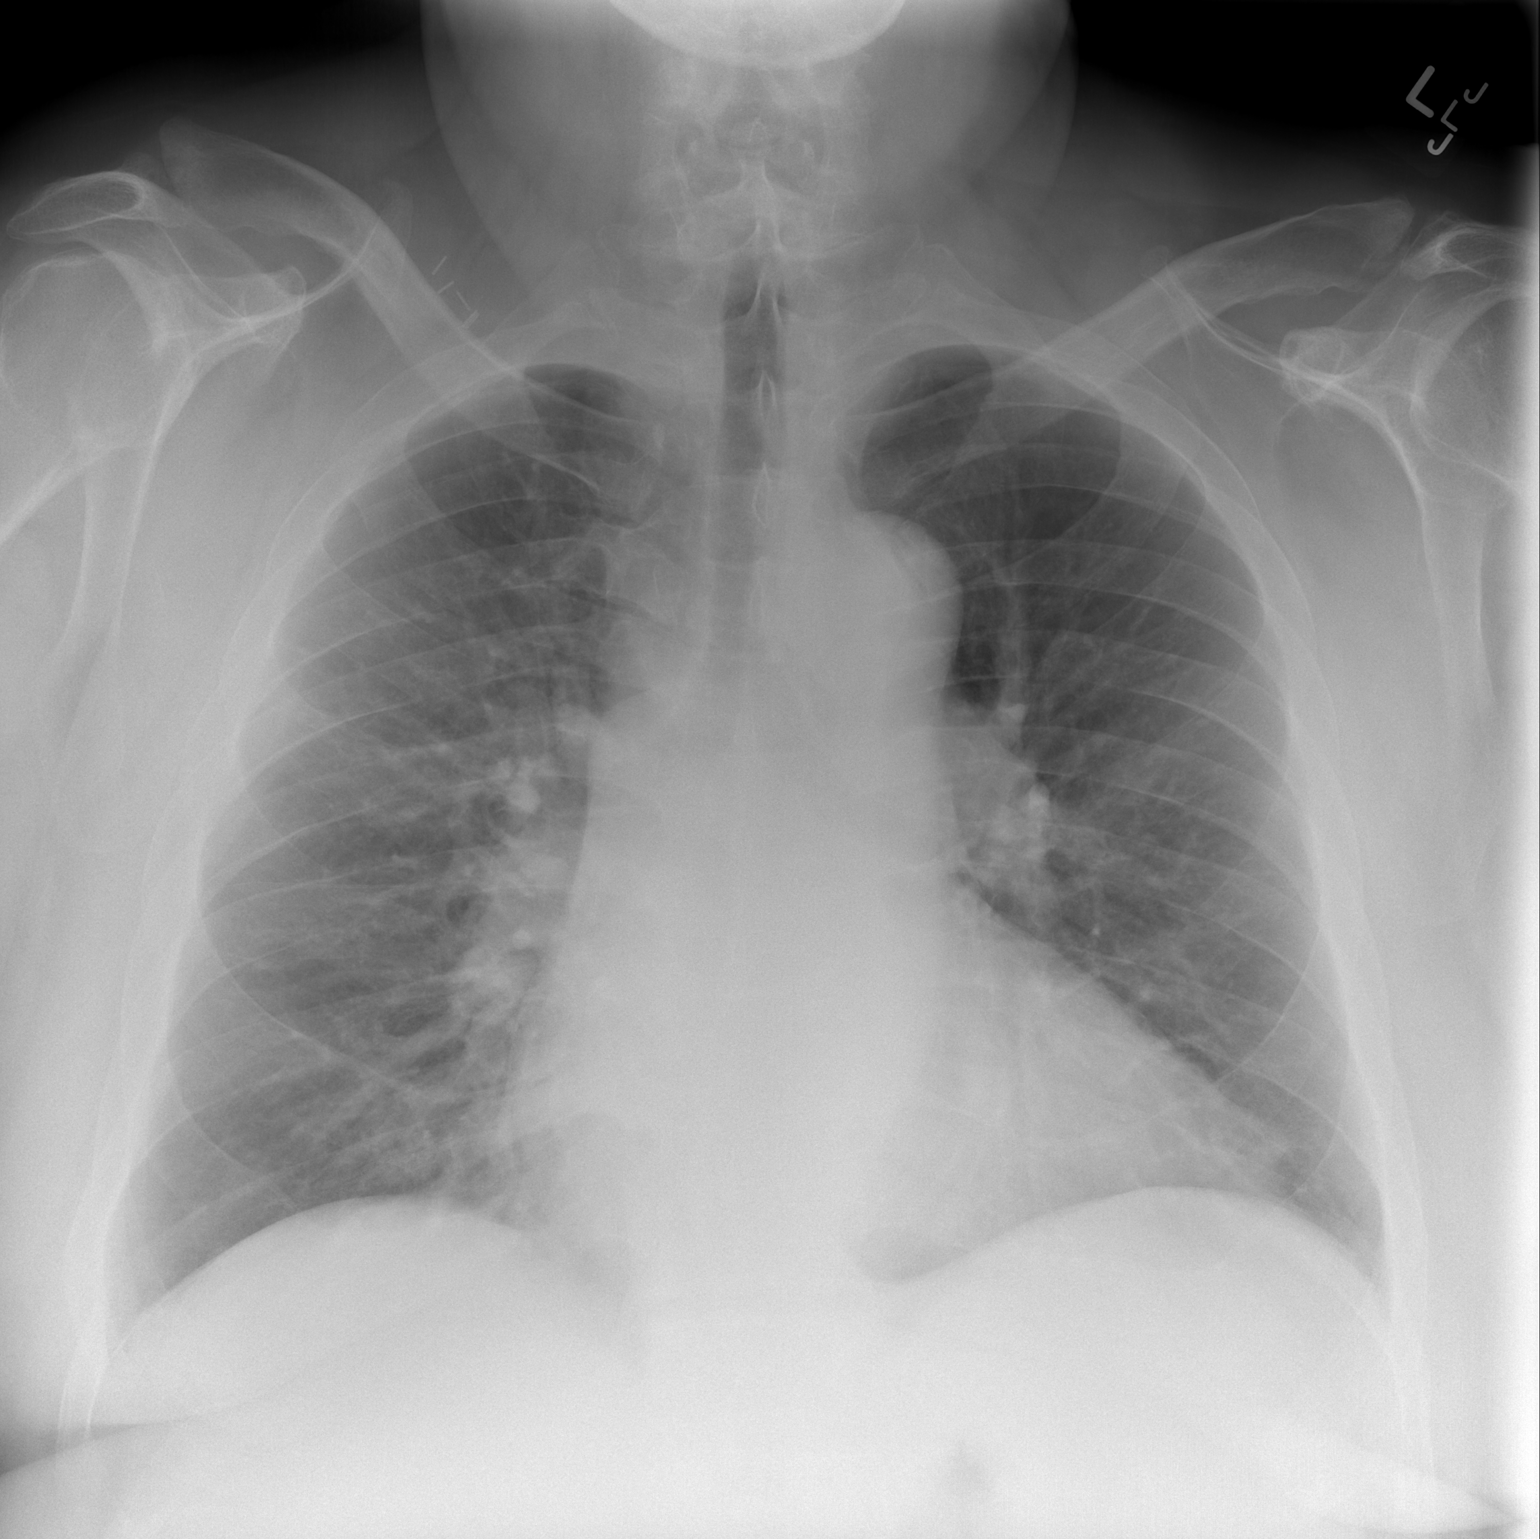

[w chest lat *]
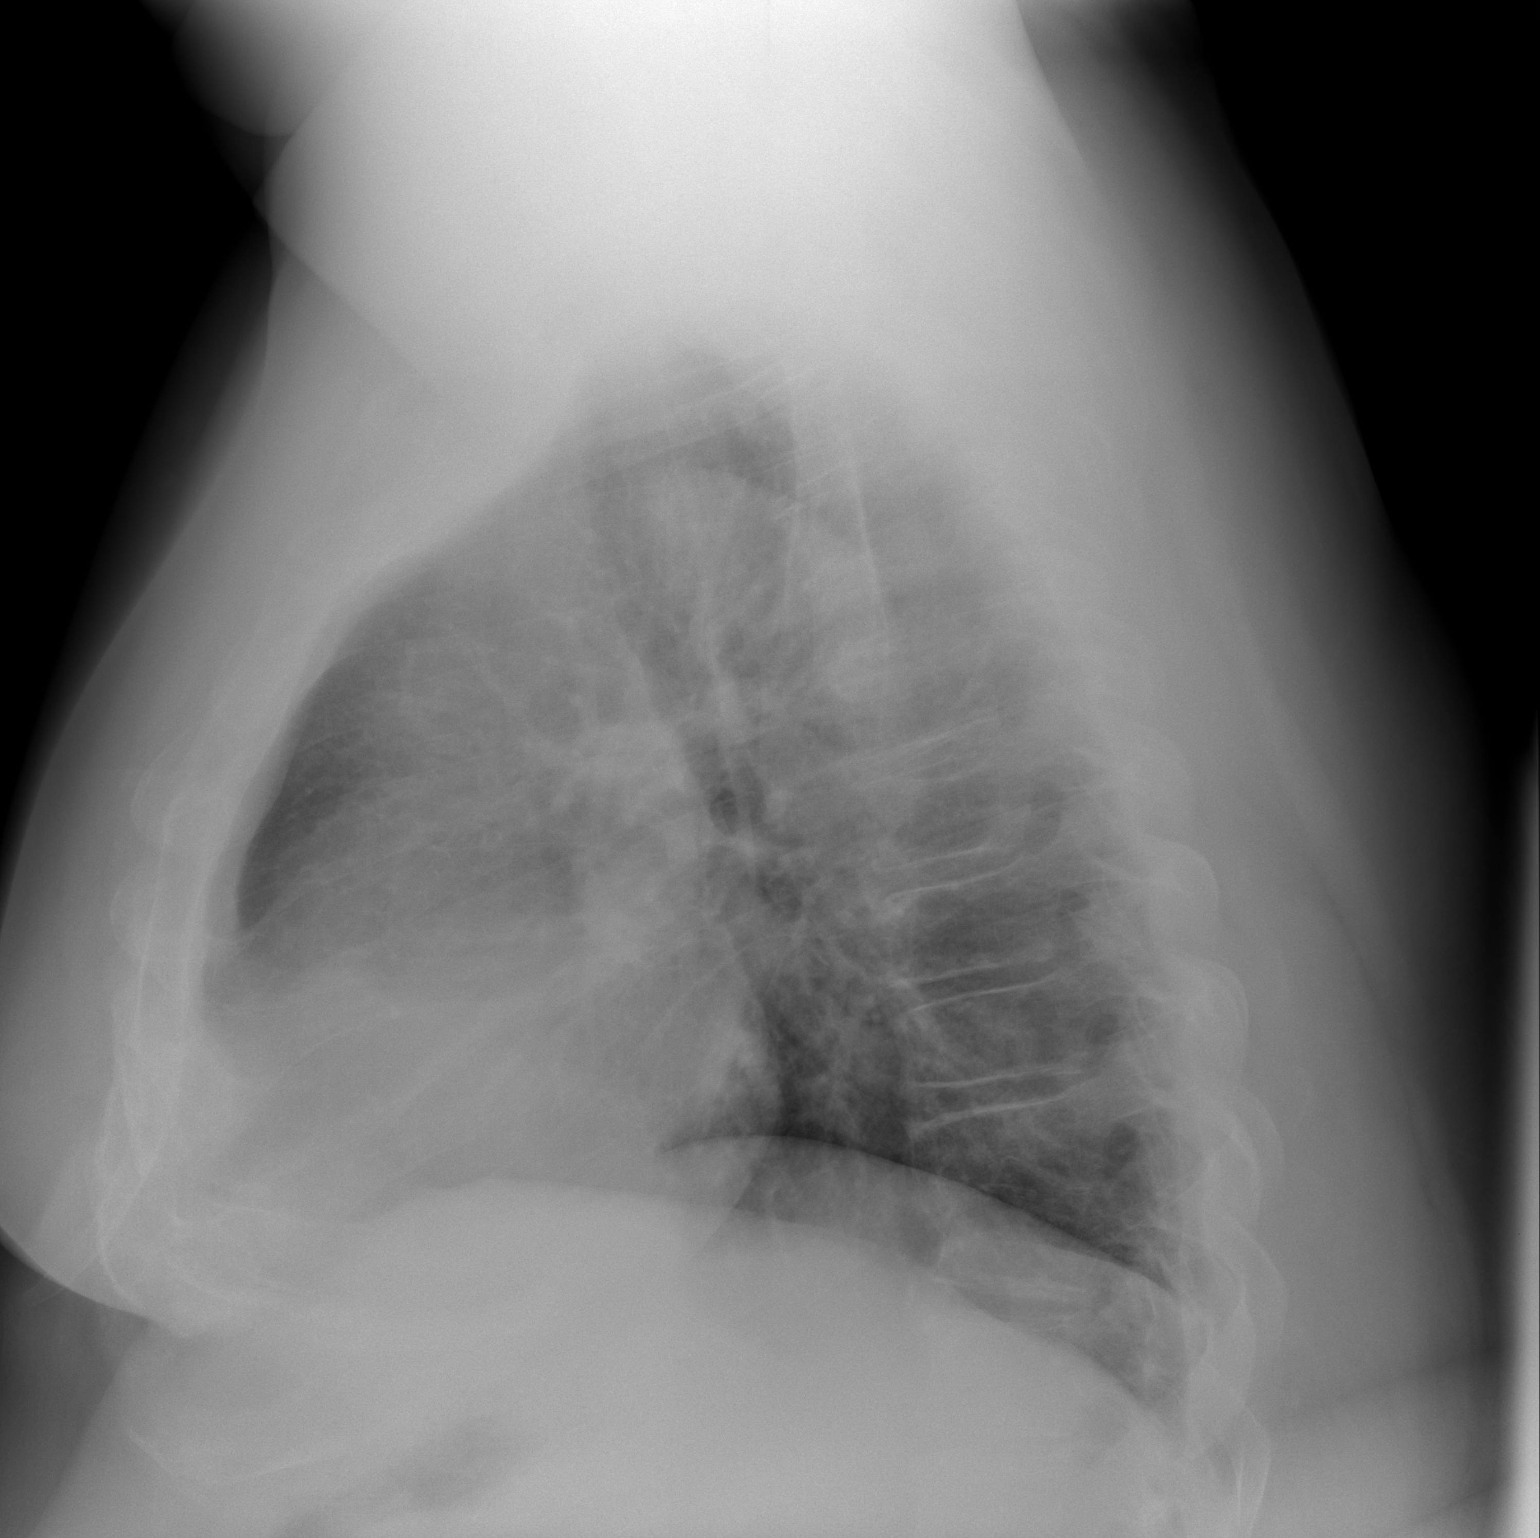

[2 of 2 positions shown; findings below may reference images not displayed]

FINDINGS: No active infiltrate or effusion is seen. Mediastinal and hilar
contours are unchanged. The heart is mildly enlarged and unchanged.
No bony abnormality is seen.
IMPRESSION: Stable cardiomegaly.  No active lung disease.

## 2015-09-29 ENCOUNTER — Other Ambulatory Visit: Payer: Self-pay | Admitting: *Deleted

## 2015-09-29 DIAGNOSIS — Z8572 Personal history of non-Hodgkin lymphomas: Secondary | ICD-10-CM

## 2015-09-29 DIAGNOSIS — G8929 Other chronic pain: Secondary | ICD-10-CM

## 2015-09-29 DIAGNOSIS — M549 Dorsalgia, unspecified: Principal | ICD-10-CM

## 2015-09-29 MED ORDER — OXYCODONE HCL 20 MG PO TABS: 20.0000 mg | ORAL_TABLET | Freq: Three times a day (TID) | ORAL | Status: DC | PRN

## 2015-09-29 MED ORDER — OXYCODONE HCL ER 40 MG PO T12A: 40.0000 mg | EXTENDED_RELEASE_TABLET | Freq: Two times a day (BID) | ORAL | Status: DC

## 2015-10-15 ENCOUNTER — Telehealth: Payer: Self-pay | Admitting: Family Medicine

## 2015-10-15 NOTE — Telephone Encounter (Signed)
error 

## 2015-10-27 ENCOUNTER — Other Ambulatory Visit: Payer: Self-pay | Admitting: *Deleted

## 2015-10-27 DIAGNOSIS — M549 Dorsalgia, unspecified: Principal | ICD-10-CM

## 2015-10-27 DIAGNOSIS — Z8572 Personal history of non-Hodgkin lymphomas: Secondary | ICD-10-CM

## 2015-10-27 DIAGNOSIS — G8929 Other chronic pain: Secondary | ICD-10-CM

## 2015-10-27 MED ORDER — OXYCODONE HCL ER 40 MG PO T12A: 40.0000 mg | EXTENDED_RELEASE_TABLET | Freq: Two times a day (BID) | ORAL | Status: DC

## 2015-10-27 MED ORDER — OXYCODONE HCL 20 MG PO TABS: 20.0000 mg | ORAL_TABLET | Freq: Three times a day (TID) | ORAL | Status: DC | PRN

## 2015-11-24 ENCOUNTER — Other Ambulatory Visit: Payer: Self-pay | Admitting: *Deleted

## 2015-11-24 DIAGNOSIS — G8929 Other chronic pain: Secondary | ICD-10-CM

## 2015-11-24 DIAGNOSIS — Z8572 Personal history of non-Hodgkin lymphomas: Secondary | ICD-10-CM

## 2015-11-24 DIAGNOSIS — M549 Dorsalgia, unspecified: Principal | ICD-10-CM

## 2015-11-24 MED ORDER — OXYCODONE HCL ER 40 MG PO T12A: 40.0000 mg | EXTENDED_RELEASE_TABLET | Freq: Two times a day (BID) | ORAL | Status: DC

## 2015-11-24 MED ORDER — OXYCODONE HCL 20 MG PO TABS: 20.0000 mg | ORAL_TABLET | Freq: Three times a day (TID) | ORAL | Status: DC | PRN

## 2015-12-23 ENCOUNTER — Other Ambulatory Visit: Payer: Self-pay | Admitting: *Deleted

## 2015-12-23 DIAGNOSIS — Z8572 Personal history of non-Hodgkin lymphomas: Secondary | ICD-10-CM

## 2015-12-23 DIAGNOSIS — G8929 Other chronic pain: Secondary | ICD-10-CM

## 2015-12-23 DIAGNOSIS — M549 Dorsalgia, unspecified: Principal | ICD-10-CM

## 2015-12-24 MED ORDER — OXYCODONE HCL ER 40 MG PO T12A: 40.0000 mg | EXTENDED_RELEASE_TABLET | Freq: Two times a day (BID) | ORAL | Status: DC

## 2015-12-24 MED ORDER — OXYCODONE HCL 20 MG PO TABS: 20.0000 mg | ORAL_TABLET | Freq: Three times a day (TID) | ORAL | Status: DC | PRN

## 2016-01-20 ENCOUNTER — Other Ambulatory Visit: Payer: Self-pay | Admitting: Nurse Practitioner

## 2016-01-20 DIAGNOSIS — G8929 Other chronic pain: Secondary | ICD-10-CM

## 2016-01-20 DIAGNOSIS — Z8572 Personal history of non-Hodgkin lymphomas: Secondary | ICD-10-CM

## 2016-01-20 DIAGNOSIS — M549 Dorsalgia, unspecified: Principal | ICD-10-CM

## 2016-01-20 MED ORDER — OXYCODONE HCL ER 40 MG PO T12A: 40.0000 mg | EXTENDED_RELEASE_TABLET | Freq: Two times a day (BID) | ORAL | Status: DC

## 2016-01-20 MED ORDER — OXYCODONE HCL 20 MG PO TABS: 20.0000 mg | ORAL_TABLET | Freq: Three times a day (TID) | ORAL | Status: DC | PRN

## 2016-01-22 ENCOUNTER — Encounter: Payer: Self-pay | Admitting: Hematology & Oncology

## 2016-01-22 ENCOUNTER — Ambulatory Visit (HOSPITAL_BASED_OUTPATIENT_CLINIC_OR_DEPARTMENT_OTHER): Payer: BLUE CROSS/BLUE SHIELD | Admitting: Hematology & Oncology

## 2016-01-22 ENCOUNTER — Other Ambulatory Visit (HOSPITAL_BASED_OUTPATIENT_CLINIC_OR_DEPARTMENT_OTHER): Payer: BLUE CROSS/BLUE SHIELD

## 2016-01-22 VITALS — BP 130/66 | HR 77 | Temp 98.2°F | Resp 18 | Ht 71.0 in | Wt >= 6400 oz

## 2016-01-22 DIAGNOSIS — I890 Lymphedema, not elsewhere classified: Secondary | ICD-10-CM

## 2016-01-22 DIAGNOSIS — Z8572 Personal history of non-Hodgkin lymphomas: Secondary | ICD-10-CM

## 2016-01-22 DIAGNOSIS — M549 Dorsalgia, unspecified: Principal | ICD-10-CM

## 2016-01-22 DIAGNOSIS — M4620 Osteomyelitis of vertebra, site unspecified: Secondary | ICD-10-CM | POA: Diagnosis not present

## 2016-01-22 DIAGNOSIS — M170 Bilateral primary osteoarthritis of knee: Secondary | ICD-10-CM

## 2016-01-22 DIAGNOSIS — G8929 Other chronic pain: Secondary | ICD-10-CM

## 2016-01-22 LAB — CBC WITH DIFFERENTIAL (CANCER CENTER ONLY)
BASO#: 0 10*3/uL (ref 0.0–0.2)
BASO%: 0.3 % (ref 0.0–2.0)
EOS%: 1.8 % (ref 0.0–7.0)
Eosinophils Absolute: 0.1 10*3/uL (ref 0.0–0.5)
HEMATOCRIT: 44.3 % (ref 38.7–49.9)
HGB: 14.5 g/dL (ref 13.0–17.1)
LYMPH#: 2 10*3/uL (ref 0.9–3.3)
LYMPH%: 30.5 % (ref 14.0–48.0)
MCH: 31.5 pg (ref 28.0–33.4)
MCHC: 32.7 g/dL (ref 32.0–35.9)
MCV: 96 fL (ref 82–98)
MONO#: 0.6 10*3/uL (ref 0.1–0.9)
MONO%: 8.8 % (ref 0.0–13.0)
NEUT#: 3.9 10*3/uL (ref 1.5–6.5)
NEUT%: 58.6 % (ref 40.0–80.0)
Platelets: 100 10*3/uL — ABNORMAL LOW (ref 145–400)
RBC: 4.61 10*6/uL (ref 4.20–5.70)
RDW: 15.2 % (ref 11.1–15.7)
WBC: 6.6 10*3/uL (ref 4.0–10.0)

## 2016-01-22 LAB — CHCC SATELLITE - SMEAR

## 2016-01-22 NOTE — Progress Notes (Signed)
Hematology and Oncology Follow Up Visit  Albert James [id number] [birth year] 60 y.o. 01/22/2016   Principle Diagnosis:  1. Follicular small cell non-Hodgkin lymphoma, clinical remission. 2. Morbid obesity. 3. Lymphedema of his legs. 4. Vertebral osteomyelitis.   Current Therapy:    Observation     Interim History:  Albert James is back for followup. We saw last saw him back in September. He still is bothered quite a bit by his weight. He is really bothered by his knees. He has bad osteoarthritis in both knees He is bothered by his back. He really has a very hard time getting around now.   The good news is that his son is getting married tomorrow. This is really a big event for him. I think he will do well at the wedding. The wedding will be in town.   He is on total disability. He is helping out with all the animals that he has. He has a lot of goats. Has a lot of chickens. 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  His sister actually lives next-door to me. She recently moved. It sounds like the house will go on the market.   He is on pain medication. He's doing pretty well with the OxyContin and oxycodone.  Overall, his performance status is ECOG 3.  Medications:  Current outpatient prescriptions:  .  B Complex-C (B-COMPLEX WITH VITAMIN C) tablet, Take 1 tablet by mouth every morning. , Disp: , Rfl:  .  Diclofenac Sodium 1.5 % SOLN, , Disp: , Rfl: 2 .  docusate sodium (COLACE) 100 MG capsule, Take 1 capsule (100 mg total) by mouth 2 (two) times daily as needed for mild constipation., Disp: 20 capsule, Rfl: 1 .  etodolac (LODINE) 400 MG tablet, Take 400 mg by mouth 2 (two) times daily., Disp: , Rfl:  .  fluticasone (FLONASE) 50 MCG/ACT nasal spray, Place 1 spray into the nose as needed for allergies. , Disp: , Rfl:  .  furosemide (LASIX) 40 MG tablet, Take 40 mg by mouth 2 (two) times daily.  , Disp: , Rfl:  .  glucosamine-chondroitin 500-400 MG tablet, Take 2 tablets  by mouth 2 (two) times daily., Disp: , Rfl:  .  lactulose (CHRONULAC) 10 GM/15ML solution, Take 10 g by mouth as needed for mild constipation. , Disp: , Rfl:  .  levothyroxine (SYNTHROID, LEVOTHROID) 50 MCG tablet, TK 1 T PO D, Disp: , Rfl: 3 .  nadolol (CORGARD) 20 MG tablet, Take 20 mg by mouth every morning. , Disp: , Rfl:  .  Omega-3 Fatty Acids (FISH OIL) 1000 MG CAPS, Take 1 capsule by mouth 2 (two) times daily. , Disp: , Rfl:  .  oxyCODONE (OXYCONTIN) 40 mg 12 hr tablet, Take 1 tablet (40 mg total) by mouth every 12 (twelve) hours., Disp: 60 tablet, Rfl: 0 .  Oxycodone HCl 20 MG TABS, Take 1 tablet (20 mg total) by mouth every 8 (eight) hours as needed., Disp: 90 tablet, Rfl: 0 .  spironolactone (ALDACTONE) 100 MG tablet, Take 100 mg by mouth 2 (two) times daily.  , Disp: , Rfl:  .  VOLTAREN 1 % GEL, APPLY 1 GEL AS DIRECTED 4 ITMES A DAY, Disp: , Rfl: 2  Allergies:  Allergies  Allergen Reactions  . Vancomycin Other (See Comments)    "red man" syndrome (when in large quantity)    Past Medical History, Surgical history, Social history, and Family History were reviewed and updated.  Review of Systems: As above  Physical Exam:  height is 5' 11"  (1.803 m) and weight is 484 lb (219.541 kg). His oral temperature is 98.2 F (36.8 C). His blood pressure is 130/66 and his pulse is 77. His respiration is 18.   Morbidly obese white gentleman. Head and neck exam shows no ocular or oral lesions. He has no palpable cervical or supraclavicular lymph nodes. Lungs are clear. Cardiac exam regular rate and rhythm with no murmurs rubs or bruits. Abdomen is soft. His morbidly obese. There is no fluid wave. There is no palpable liver or spleen tip. Extremities shows improvement in the edema on his lower legs. He has no ecchymoses. He has no phlebitic issues. He does have a shoulder harness for the left arm. Back exam shows no tenderness over the spine. Neurological exam shows no focal neurological  deficits. Skin exam no rashes, ecchymoses or petechia.  Lab Results  Component Value Date   WBC 6.6 01/22/2016   HGB 14.5 01/22/2016   HCT 44.3 01/22/2016   MCV 96 01/22/2016   PLT 100* 01/22/2016     Chemistry      Component Value Date/Time   NA 142 07/23/2015 1447   NA 145 01/16/2015 1451   K 4.3 07/23/2015 1447   K 4.1 01/16/2015 1451   CL 102 07/23/2015 1447   CL 102 01/16/2015 1451   CO2 28 07/23/2015 1447   CO2 32 01/16/2015 1451   BUN 12 07/23/2015 1447   BUN 13 01/16/2015 1451   CREATININE 0.97 07/23/2015 1447   CREATININE 1.1 01/16/2015 1451      Component Value Date/Time   CALCIUM 9.1 07/23/2015 1447   CALCIUM 9.3 01/16/2015 1451   ALKPHOS 70 07/23/2015 1447   ALKPHOS 74 01/16/2015 1451   AST 16 07/23/2015 1447   AST 17 01/16/2015 1451   ALT 13 07/23/2015 1447   ALT 19 01/16/2015 1451   BILITOT 0.7 07/23/2015 1447   BILITOT 0.70 01/16/2015 1451         Impression and Plan: Albert James is 60 year old gentleman. He has had a history of follicular lymphoma. This was probably 11 years ago. Again, this has not been a problem for him.  He has all these other health issues. I really do not think that lymphoma is ever going to be a problem for him.   I think that it is great at his son is giving married tomorrow. He is looking forward to the ceremony.  I think that we will plan to get him back in 6 months.   Volanda Napoleon, MD 3/9/20173:51 PM

## 2016-01-26 ENCOUNTER — Ambulatory Visit (INDEPENDENT_AMBULATORY_CARE_PROVIDER_SITE_OTHER): Payer: BLUE CROSS/BLUE SHIELD | Admitting: Podiatry

## 2016-01-26 ENCOUNTER — Encounter: Payer: Self-pay | Admitting: Podiatry

## 2016-01-26 VITALS — Resp 16 | Ht 72.0 in | Wt >= 6400 oz

## 2016-01-26 DIAGNOSIS — M79674 Pain in right toe(s): Secondary | ICD-10-CM | POA: Diagnosis not present

## 2016-01-26 DIAGNOSIS — B351 Tinea unguium: Secondary | ICD-10-CM | POA: Diagnosis not present

## 2016-01-26 DIAGNOSIS — M79675 Pain in left toe(s): Secondary | ICD-10-CM | POA: Diagnosis not present

## 2016-01-26 DIAGNOSIS — M79604 Pain in right leg: Secondary | ICD-10-CM

## 2016-01-26 DIAGNOSIS — M79605 Pain in left leg: Secondary | ICD-10-CM

## 2016-01-26 NOTE — Progress Notes (Signed)
   Subjective:    Patient ID: Albert James, male    DOB: Feb 18, 1956, 60 y.o.   MRN: [id number]  HPI Patient presents with needing a bilateral nail trim.  Patient also presents with a bilateral nail problem; nail discoloration & thickened nails. Pt stated, "wants all nail checked for nail fungus". Pt has tried OTC medication with some relief.   Review of Systems  Constitutional: Positive for activity change and fatigue.  HENT: Positive for hearing loss, sinus pressure and tinnitus.   Respiratory: Positive for shortness of breath.   Cardiovascular: Positive for leg swelling.  Gastrointestinal: Positive for diarrhea.  Musculoskeletal: Positive for myalgias, back pain, arthralgias and gait problem.  All other systems reviewed and are negative.      Objective:   Physical Exam        Assessment & Plan:

## 2016-01-26 NOTE — Progress Notes (Signed)
Subjective:     Patient ID: Albert James, male   DOB: [birth year], 60 y.o.   MRN: [id number]  HPI patient presents with significant elongation of nailbeds 1-5 both feet with a long-term history of obesity and inability to take care of his own nails   Review of Systems  All other systems reviewed and are negative.      Objective:   Physical Exam  Constitutional: He is oriented to person, place, and time.  Cardiovascular: Intact distal pulses.   Musculoskeletal: Normal range of motion.  Neurological: He is oriented to person, place, and time.  Skin: Skin is warm and dry.  Nursing note and vitals reviewed.  neurovascular status found to be diminished bilateral with thick yellow brittle nailbeds 1-5 both feet that are painful when pressed dorsally and make it hard for the patient to wear shoe gear comfortably. States that he has had this problem for a long time and I did note good digital perfusion and patient to be well oriented 3     Assessment:     Obese male who cannot reach his toes with thickened yellow brittle elongated nailbeds that are painful 1-5 both feet    Plan:     H&P and conditions reviewed with patient. Today I debrided all nails 1-5 both feet and at this time I do not recommend any oral treatment as I do not believe will be effective. Patient will be seen back for routine care 3 months or earlier if needed

## 2016-02-19 ENCOUNTER — Other Ambulatory Visit: Payer: Self-pay | Admitting: *Deleted

## 2016-02-19 DIAGNOSIS — Z8572 Personal history of non-Hodgkin lymphomas: Secondary | ICD-10-CM

## 2016-02-19 DIAGNOSIS — G8929 Other chronic pain: Secondary | ICD-10-CM

## 2016-02-19 DIAGNOSIS — M549 Dorsalgia, unspecified: Principal | ICD-10-CM

## 2016-02-19 MED ORDER — OXYCODONE HCL 20 MG PO TABS: 20.0000 mg | ORAL_TABLET | Freq: Three times a day (TID) | ORAL | Status: DC | PRN

## 2016-02-19 MED ORDER — OXYCODONE HCL ER 40 MG PO T12A: 40.0000 mg | EXTENDED_RELEASE_TABLET | Freq: Two times a day (BID) | ORAL | Status: DC

## 2016-03-17 ENCOUNTER — Other Ambulatory Visit: Payer: Self-pay | Admitting: *Deleted

## 2016-03-17 DIAGNOSIS — Z8572 Personal history of non-Hodgkin lymphomas: Secondary | ICD-10-CM

## 2016-03-17 DIAGNOSIS — M549 Dorsalgia, unspecified: Principal | ICD-10-CM

## 2016-03-17 DIAGNOSIS — G8929 Other chronic pain: Secondary | ICD-10-CM

## 2016-03-17 MED ORDER — OXYCODONE HCL ER 40 MG PO T12A
40.0000 mg | EXTENDED_RELEASE_TABLET | Freq: Two times a day (BID) | ORAL | Status: DC
Start: 2016-03-17 — End: 2016-04-14

## 2016-03-17 MED ORDER — OXYCODONE HCL 20 MG PO TABS: 20.0000 mg | ORAL_TABLET | Freq: Three times a day (TID) | ORAL | Status: DC | PRN

## 2016-04-14 ENCOUNTER — Other Ambulatory Visit: Payer: Self-pay | Admitting: *Deleted

## 2016-04-14 DIAGNOSIS — M549 Dorsalgia, unspecified: Principal | ICD-10-CM

## 2016-04-14 DIAGNOSIS — Z8572 Personal history of non-Hodgkin lymphomas: Secondary | ICD-10-CM

## 2016-04-14 DIAGNOSIS — G8929 Other chronic pain: Secondary | ICD-10-CM

## 2016-04-14 MED ORDER — OXYCODONE HCL 20 MG PO TABS: 20.0000 mg | ORAL_TABLET | Freq: Three times a day (TID) | ORAL | Status: DC | PRN

## 2016-04-14 MED ORDER — OXYCODONE HCL ER 40 MG PO T12A: 40.0000 mg | EXTENDED_RELEASE_TABLET | Freq: Two times a day (BID) | ORAL | Status: DC

## 2016-05-04 ENCOUNTER — Encounter: Payer: Self-pay | Admitting: Podiatry

## 2016-05-04 ENCOUNTER — Ambulatory Visit (INDEPENDENT_AMBULATORY_CARE_PROVIDER_SITE_OTHER): Payer: BLUE CROSS/BLUE SHIELD | Admitting: Podiatry

## 2016-05-04 DIAGNOSIS — B351 Tinea unguium: Secondary | ICD-10-CM | POA: Diagnosis not present

## 2016-05-04 DIAGNOSIS — M79675 Pain in left toe(s): Secondary | ICD-10-CM | POA: Diagnosis not present

## 2016-05-04 DIAGNOSIS — M79674 Pain in right toe(s): Secondary | ICD-10-CM

## 2016-05-04 DIAGNOSIS — M79605 Pain in left leg: Secondary | ICD-10-CM

## 2016-05-04 DIAGNOSIS — M79604 Pain in right leg: Secondary | ICD-10-CM

## 2016-05-04 NOTE — Progress Notes (Signed)
Patient ID: SY SAINTJEAN, male   DOB: 01/15/1956, 59 y.o.   MRN: [id number] Complaint:  Visit Type: Patient returns to my office for continued preventative foot care services. Complaint: Patient states" my nails have grown long and thick and become painful to walk and wear shoes" Patient is obese and unable to get to his feet. The patient presents for preventative foot care services. No changes to ROS  Podiatric Exam: Vascular: dorsalis pedis and posterior tibial pulses are not  palpable bilateral. Capillary return is immediate. Temperature gradient is WNL. Skin turgor WNL  Sensorium: Normal Semmes Weinstein monofilament test. Normal tactile sensation bilaterally. Nail Exam: Pt has thick disfigured discolored nails with subungual debris noted bilateral entire nail hallux through fifth toenails Ulcer Exam: There is no evidence of ulcer or pre-ulcerative changes or infection. Orthopedic Exam: Muscle tone and strength are WNL. No limitations in general ROM. No crepitus or effusions noted. Foot type and digits show no abnormalities. Bony prominences are unremarkable. Severe swelling in legs and feet. Skin: No Porokeratosis. No infection or ulcers  Diagnosis:  Onychomycosis, , Pain in right toe, pain in left toes  Treatment & Plan Procedures and Treatment: Consent by patient was obtained for treatment procedures. The patient understood the discussion of treatment and procedures well. All questions were answered thoroughly reviewed. Debridement of mycotic and hypertrophic toenails, 1 through 5 bilateral and clearing of subungual debris. No ulceration, no infection noted.  Return Visit-Office Procedure: Patient instructed to return to the office for a follow up visit 10 weeks  for continued evaluation and treatment.    Gardiner Barefoot DPM

## 2016-05-12 ENCOUNTER — Other Ambulatory Visit: Payer: Self-pay | Admitting: *Deleted

## 2016-05-12 DIAGNOSIS — Z8572 Personal history of non-Hodgkin lymphomas: Secondary | ICD-10-CM

## 2016-05-12 DIAGNOSIS — M549 Dorsalgia, unspecified: Principal | ICD-10-CM

## 2016-05-12 DIAGNOSIS — G8929 Other chronic pain: Secondary | ICD-10-CM

## 2016-05-12 MED ORDER — OXYCODONE HCL 20 MG PO TABS: 20.0000 mg | ORAL_TABLET | Freq: Three times a day (TID) | ORAL | Status: DC | PRN

## 2016-05-12 MED ORDER — OXYCODONE HCL ER 40 MG PO T12A: 40.0000 mg | EXTENDED_RELEASE_TABLET | Freq: Two times a day (BID) | ORAL | Status: DC

## 2016-05-20 ENCOUNTER — Ambulatory Visit (INDEPENDENT_AMBULATORY_CARE_PROVIDER_SITE_OTHER): Payer: BLUE CROSS/BLUE SHIELD

## 2016-05-20 ENCOUNTER — Encounter: Payer: Self-pay | Admitting: Podiatry

## 2016-05-20 ENCOUNTER — Ambulatory Visit (INDEPENDENT_AMBULATORY_CARE_PROVIDER_SITE_OTHER): Payer: BLUE CROSS/BLUE SHIELD | Admitting: Podiatry

## 2016-05-20 DIAGNOSIS — M79671 Pain in right foot: Secondary | ICD-10-CM

## 2016-05-20 DIAGNOSIS — M79672 Pain in left foot: Secondary | ICD-10-CM

## 2016-05-20 DIAGNOSIS — M779 Enthesopathy, unspecified: Secondary | ICD-10-CM

## 2016-06-05 NOTE — Progress Notes (Signed)
Subjective:     Patient ID: Albert James, male   DOB: November 09, 1956, 60 y.o.   MRN: [id number]  HPI patient presents stating he is concerned about the way he walks on his heel and whether or not his feet as turning in   Review of Systems     Objective:   Physical Exam Neurovascular status unchanged with patient noted to have moderate changes in his left arch that are present    Assessment:     Tendinitis-like symptoms left with abnormal appearance to the left arch but not significant with no muscular loss    Plan:     Explain wearing supportive shoes and at this point I do not recommend any further treatment and less symptoms were to worsen or the arch were to worsen as far as the height goes patient will be seen back as needed

## 2016-06-08 ENCOUNTER — Other Ambulatory Visit: Payer: Self-pay | Admitting: *Deleted

## 2016-06-08 DIAGNOSIS — Z8572 Personal history of non-Hodgkin lymphomas: Secondary | ICD-10-CM

## 2016-06-08 DIAGNOSIS — M549 Dorsalgia, unspecified: Principal | ICD-10-CM

## 2016-06-08 DIAGNOSIS — G8929 Other chronic pain: Secondary | ICD-10-CM

## 2016-06-08 MED ORDER — OXYCODONE HCL ER 40 MG PO T12A: 40.0000 mg | EXTENDED_RELEASE_TABLET | Freq: Two times a day (BID) | ORAL | 0 refills | Status: DC

## 2016-06-08 MED ORDER — OXYCODONE HCL 20 MG PO TABS: 20.0000 mg | ORAL_TABLET | Freq: Three times a day (TID) | ORAL | 0 refills | Status: DC | PRN

## 2016-06-21 ENCOUNTER — Ambulatory Visit
Admission: RE | Admit: 2016-06-21 | Discharge: 2016-06-21 | Disposition: A | Discharge status: Home / Self Care | Payer: BLUE CROSS/BLUE SHIELD | Source: Ambulatory Visit | Attending: Family Medicine | Admitting: Family Medicine

## 2016-06-21 ENCOUNTER — Other Ambulatory Visit: Payer: Self-pay | Admitting: Family Medicine

## 2016-06-21 DIAGNOSIS — Z8579 Personal history of other malignant neoplasms of lymphoid, hematopoietic and related tissues: Secondary | ICD-10-CM

## 2016-06-21 DIAGNOSIS — R509 Fever, unspecified: Secondary | ICD-10-CM

## 2016-07-08 ENCOUNTER — Other Ambulatory Visit: Payer: Self-pay

## 2016-07-08 DIAGNOSIS — Z8572 Personal history of non-Hodgkin lymphomas: Secondary | ICD-10-CM

## 2016-07-08 DIAGNOSIS — M549 Dorsalgia, unspecified: Principal | ICD-10-CM

## 2016-07-08 DIAGNOSIS — G8929 Other chronic pain: Secondary | ICD-10-CM

## 2016-07-08 MED ORDER — OXYCODONE HCL 20 MG PO TABS: 20.0000 mg | ORAL_TABLET | Freq: Three times a day (TID) | ORAL | 0 refills | Status: DC | PRN

## 2016-07-08 MED ORDER — OXYCODONE HCL ER 40 MG PO T12A: 40.0000 mg | EXTENDED_RELEASE_TABLET | Freq: Two times a day (BID) | ORAL | 0 refills | Status: DC

## 2016-07-29 ENCOUNTER — Encounter: Payer: Self-pay | Admitting: Hematology & Oncology

## 2016-07-29 ENCOUNTER — Ambulatory Visit (HOSPITAL_BASED_OUTPATIENT_CLINIC_OR_DEPARTMENT_OTHER): Payer: BLUE CROSS/BLUE SHIELD

## 2016-07-29 ENCOUNTER — Ambulatory Visit (HOSPITAL_BASED_OUTPATIENT_CLINIC_OR_DEPARTMENT_OTHER): Payer: BLUE CROSS/BLUE SHIELD | Admitting: Hematology & Oncology

## 2016-07-29 ENCOUNTER — Other Ambulatory Visit (HOSPITAL_BASED_OUTPATIENT_CLINIC_OR_DEPARTMENT_OTHER): Payer: BLUE CROSS/BLUE SHIELD

## 2016-07-29 VITALS — BP 120/65 | HR 72 | Temp 98.2°F | Resp 20 | Ht 72.0 in | Wt >= 6400 oz

## 2016-07-29 DIAGNOSIS — Z8572 Personal history of non-Hodgkin lymphomas: Secondary | ICD-10-CM

## 2016-07-29 DIAGNOSIS — G8929 Other chronic pain: Secondary | ICD-10-CM

## 2016-07-29 DIAGNOSIS — Z23 Encounter for immunization: Secondary | ICD-10-CM

## 2016-07-29 DIAGNOSIS — I890 Lymphedema, not elsewhere classified: Secondary | ICD-10-CM | POA: Diagnosis not present

## 2016-07-29 DIAGNOSIS — M549 Dorsalgia, unspecified: Principal | ICD-10-CM

## 2016-07-29 DIAGNOSIS — M170 Bilateral primary osteoarthritis of knee: Secondary | ICD-10-CM | POA: Diagnosis not present

## 2016-07-29 DIAGNOSIS — I872 Venous insufficiency (chronic) (peripheral): Secondary | ICD-10-CM

## 2016-07-29 DIAGNOSIS — M4620 Osteomyelitis of vertebra, site unspecified: Secondary | ICD-10-CM

## 2016-07-29 LAB — COMPREHENSIVE METABOLIC PANEL (CC13)
A/G RATIO: 1.5 (ref 1.2–2.2)
ALK PHOS: 90 IU/L (ref 39–117)
ALT: 11 IU/L (ref 0–44)
AST: 11 IU/L (ref 0–40)
Albumin, Serum: 3.8 g/dL (ref 3.6–4.8)
BUN/Creatinine Ratio: 16 (ref 10–24)
BUN: 15 mg/dL (ref 8–27)
Bilirubin Total: 0.4 mg/dL (ref 0.0–1.2)
Calcium, Ser: 9.1 mg/dL (ref 8.6–10.2)
Carbon Dioxide, Total: 29 mmol/L (ref 18–29)
Chloride, Ser: 100 mmol/L (ref 96–106)
Creatinine, Ser: 0.92 mg/dL (ref 0.76–1.27)
GFR calc Af Amer: 103 mL/min/{1.73_m2} (ref >59)
GFR calc non Af Amer: 89 mL/min/{1.73_m2} (ref >59)
GLOBULIN, TOTAL: 2.5 g/dL (ref 1.5–4.5)
Glucose: 103 mg/dL — ABNORMAL HIGH (ref 65–99)
Potassium, Ser: 4 mmol/L (ref 3.5–5.2)
SODIUM: 135 mmol/L (ref 134–144)
Total Protein: 6.3 g/dL (ref 6.0–8.5)

## 2016-07-29 LAB — CBC WITH DIFFERENTIAL (CANCER CENTER ONLY)
BASO#: 0 10*3/uL (ref 0.0–0.2)
BASO%: 0.3 % (ref 0.0–2.0)
EOS%: 1.5 % (ref 0.0–7.0)
Eosinophils Absolute: 0.1 10*3/uL (ref 0.0–0.5)
HEMATOCRIT: 40.1 % (ref 38.7–49.9)
HGB: 13.2 g/dL (ref 13.0–17.1)
LYMPH#: 1.8 10*3/uL (ref 0.9–3.3)
LYMPH%: 24.5 % (ref 14.0–48.0)
MCH: 30.8 pg (ref 28.0–33.4)
MCHC: 32.9 g/dL (ref 32.0–35.9)
MCV: 94 fL (ref 82–98)
MONO#: 0.7 10*3/uL (ref 0.1–0.9)
MONO%: 9.3 % (ref 0.0–13.0)
NEUT#: 4.8 10*3/uL (ref 1.5–6.5)
NEUT%: 64.4 % (ref 40.0–80.0)
Platelets: 140 10*3/uL — ABNORMAL LOW (ref 145–400)
RBC: 4.28 10*6/uL (ref 4.20–5.70)
RDW: 16.1 % — AB (ref 11.1–15.7)
WBC: 7.4 10*3/uL (ref 4.0–10.0)

## 2016-07-29 MED ORDER — INFLUENZA VAC SPLIT QUAD 0.5 ML IM SUSY
0.5000 mL | PREFILLED_SYRINGE | Freq: Once | INTRAMUSCULAR | Status: AC
  Administered 2016-07-29: 0.5 mL via INTRAMUSCULAR
  Filled 2016-07-29: qty 0.5

## 2016-07-29 NOTE — Patient Instructions (Signed)

## 2016-07-29 NOTE — Progress Notes (Signed)
Hematology and Oncology Follow Up Visit  Albert James [id number] 10/15/1956 60 y.o. 07/29/2016   Principle Diagnosis:  1. Follicular small cell non-Hodgkin lymphoma, clinical remission. 2. Morbid obesity. 3. Lymphedema of his legs. 4. Vertebral osteomyelitis.   Current Therapy:    Observation     Interim History:  [name] is back for followup. We saw last saw him back in March. He still is bothered quite a bit by his weight. He is really bothered by his knees. He has bad osteoarthritis in both knees He is bothered by his back. He really has a very hard time getting around now.   The good news is that his son, got married back in March, is already having a child. His wife is 3 months pregnant.  He is on total disability. He is helping out with all the animals that he has. He has a lot of goats. Has a lot of chickens. 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  His sister actually lives next-door to me. She recently moved. It sounds like the house will go on the market.   He is on pain medication. He's doing pretty well with the OxyContin and oxycodone.  Overall, his performance status is ECOG 3.  Medications:  Current Outpatient Prescriptions:  .  B Complex-C (B-COMPLEX WITH VITAMIN C) tablet, Take 1 tablet by mouth every morning. , Disp: , Rfl:  .  Diclofenac Sodium 1.5 % SOLN, , Disp: , Rfl: 2 .  docusate sodium (COLACE) 100 MG capsule, Take 1 capsule (100 mg total) by mouth 2 (two) times daily as needed for mild constipation., Disp: 20 capsule, Rfl: 1 .  etodolac (LODINE) 400 MG tablet, Take 400 mg by mouth 2 (two) times daily., Disp: , Rfl:  .  fluticasone (FLONASE) 50 MCG/ACT nasal spray, Place 1 spray into the nose as needed for allergies. , Disp: , Rfl:  .  furosemide (LASIX) 40 MG tablet, Take 40 mg by mouth 2 (two) times daily.  , Disp: , Rfl:  .  glucosamine-chondroitin 500-400 MG tablet, Take 2 tablets by mouth 2 (two) times daily., Disp: , Rfl:  .   lactulose (CHRONULAC) 10 GM/15ML solution, Take 10 g by mouth as needed for mild constipation. , Disp: , Rfl:  .  levothyroxine (SYNTHROID, LEVOTHROID) 50 MCG tablet, TK 1 T PO D, Disp: , Rfl: 3 .  nadolol (CORGARD) 20 MG tablet, Take 20 mg by mouth every morning. , Disp: , Rfl:  .  Omega-3 Fatty Acids (FISH OIL) 1000 MG CAPS, Take 1 capsule by mouth 2 (two) times daily. , Disp: , Rfl:  .  oxyCODONE (OXYCONTIN) 40 mg 12 hr tablet, Take 1 tablet (40 mg total) by mouth every 12 (twelve) hours., Disp: 60 tablet, Rfl: 0 .  Oxycodone HCl 20 MG TABS, Take 1 tablet (20 mg total) by mouth every 8 (eight) hours as needed., Disp: 90 tablet, Rfl: 0 .  spironolactone (ALDACTONE) 100 MG tablet, Take 100 mg by mouth 2 (two) times daily.  , Disp: , Rfl:  .  VOLTAREN 1 % GEL, APPLY 1 GEL AS DIRECTED 4 ITMES A DAY, Disp: , Rfl: 2  Allergies:  Allergies  Allergen Reactions  . Vancomycin Other (See Comments)    "red man" syndrome (when in large quantity)    Past Medical History, Surgical history, Social history, and Family History were reviewed and updated.  Review of Systems: As above  Physical Exam:  height is 6' (1.829  m) and weight is 462 lb (209.6 kg) (abnormal). His oral temperature is 98.2 F (36.8 C). His blood pressure is 120/65 and his pulse is 72. His respiration is 20.   Morbidly obese white gentleman. Head and neck exam shows no ocular or oral lesions. He has no palpable cervical or supraclavicular lymph nodes. Lungs are clear. Cardiac exam regular rate and rhythm with no murmurs rubs or bruits. Abdomen is soft. His morbidly obese. There is no fluid wave. There is no palpable liver or spleen tip. Extremities shows improvement in the edema on his lower legs. He has no ecchymoses. He has no phlebitic issues. He does have a shoulder harness for the left arm. Back exam shows no tenderness over the spine. Neurological exam shows no focal neurological deficits. Skin exam no rashes, ecchymoses or  petechia.  Lab Results  Component Value Date   WBC 7.4 07/29/2016   HGB 13.2 07/29/2016   HCT 40.1 07/29/2016   MCV 94 07/29/2016   PLT 140 (L) 07/29/2016     Chemistry      Component Value Date/Time   NA 142 07/23/2015 1447   NA 145 01/16/2015 1451   K 4.3 07/23/2015 1447   K 4.1 01/16/2015 1451   CL 102 07/23/2015 1447   CL 102 01/16/2015 1451   CO2 28 07/23/2015 1447   CO2 32 01/16/2015 1451   BUN 12 07/23/2015 1447   BUN 13 01/16/2015 1451   CREATININE 0.97 07/23/2015 1447   CREATININE 1.1 01/16/2015 1451      Component Value Date/Time   CALCIUM 9.1 07/23/2015 1447   CALCIUM 9.3 01/16/2015 1451   ALKPHOS 70 07/23/2015 1447   ALKPHOS 74 01/16/2015 1451   AST 16 07/23/2015 1447   AST 17 01/16/2015 1451   ALT 13 07/23/2015 1447   ALT 19 01/16/2015 1451   BILITOT 0.7 07/23/2015 1447   BILITOT 0.70 01/16/2015 1451         Impression and Plan: [name] is 60 year old gentleman. He has had a history of follicular lymphoma. This was probably 12 years ago. Again, this has not been a problem for him.  He has all these other health issues. I really do not think that lymphoma is ever going to be a problem for him.   I think it is great that his son is going have a baby. This will certainly occupy Albert James.  I think that we will plan to get him back in 6 months.   Volanda Napoleon, MD 9/14/20175:07 PM

## 2016-07-30 LAB — LACTATE DEHYDROGENASE: LDH: 165 U/L (ref 125–245)

## 2016-08-03 ENCOUNTER — Ambulatory Visit (INDEPENDENT_AMBULATORY_CARE_PROVIDER_SITE_OTHER): Payer: BLUE CROSS/BLUE SHIELD | Admitting: Podiatry

## 2016-08-03 ENCOUNTER — Encounter: Payer: Self-pay | Admitting: Podiatry

## 2016-08-03 VITALS — Ht 72.0 in | Wt 300.0 lb

## 2016-08-03 DIAGNOSIS — M79605 Pain in left leg: Secondary | ICD-10-CM

## 2016-08-03 DIAGNOSIS — M79604 Pain in right leg: Secondary | ICD-10-CM

## 2016-08-03 DIAGNOSIS — M79676 Pain in unspecified toe(s): Secondary | ICD-10-CM | POA: Diagnosis not present

## 2016-08-03 DIAGNOSIS — B351 Tinea unguium: Secondary | ICD-10-CM

## 2016-08-03 NOTE — Progress Notes (Signed)
Patient ID: SOLLIE VULTAGGIO, male   DOB: Jul 27, 1956, 60 y.o.   MRN: [id number] Complaint:  Visit Type: Patient returns to my office for continued preventative foot care services. Complaint: Patient states" my nails have grown long and thick and become painful to walk and wear shoes" Patient is obese and unable to get to his feet. The patient presents for preventative foot care services. No changes to ROS  Podiatric Exam: Vascular: dorsalis pedis and posterior tibial pulses are not  palpable bilateral. Capillary return is immediate. Temperature gradient is WNL. Skin turgor WNL  Sensorium: Normal Semmes Weinstein monofilament test. Normal tactile sensation bilaterally. Nail Exam: Pt has thick disfigured discolored nails with subungual debris noted bilateral entire nail hallux through fifth toenails Ulcer Exam: There is no evidence of ulcer or pre-ulcerative changes or infection. Orthopedic Exam: Muscle tone and strength are WNL. No limitations in general ROM. No crepitus or effusions noted. Foot type and digits show no abnormalities. Bony prominences are unremarkable. Severe swelling in legs and feet. Skin: No Porokeratosis. No infection or ulcers  Diagnosis:  Onychomycosis, , Pain in right toe, pain in left toes  Treatment & Plan Procedures and Treatment: Consent by patient was obtained for treatment procedures. The patient understood the discussion of treatment and procedures well. All questions were answered thoroughly reviewed. Debridement of mycotic and hypertrophic toenails, 1 through 5 bilateral and clearing of subungual debris. No ulceration, no infection noted.  Return Visit-Office Procedure: Patient instructed to return to the office for a follow up visit 10 weeks  for continued evaluation and treatment.    Gardiner Barefoot DPM

## 2016-08-04 ENCOUNTER — Other Ambulatory Visit: Payer: Self-pay | Admitting: Family Medicine

## 2016-08-04 ENCOUNTER — Ambulatory Visit
Admission: RE | Admit: 2016-08-04 | Discharge: 2016-08-04 | Disposition: A | Discharge status: Home / Self Care | Payer: BLUE CROSS/BLUE SHIELD | Source: Ambulatory Visit | Attending: Family Medicine | Admitting: Family Medicine

## 2016-08-04 DIAGNOSIS — R51 Headache: Principal | ICD-10-CM

## 2016-08-04 DIAGNOSIS — R519 Headache, unspecified: Secondary | ICD-10-CM

## 2016-08-04 DIAGNOSIS — M5489 Other dorsalgia: Secondary | ICD-10-CM

## 2016-08-04 DIAGNOSIS — M542 Cervicalgia: Secondary | ICD-10-CM

## 2016-08-09 ENCOUNTER — Other Ambulatory Visit: Payer: Self-pay | Admitting: *Deleted

## 2016-08-09 DIAGNOSIS — G8929 Other chronic pain: Secondary | ICD-10-CM

## 2016-08-09 DIAGNOSIS — Z8572 Personal history of non-Hodgkin lymphomas: Secondary | ICD-10-CM

## 2016-08-09 DIAGNOSIS — M549 Dorsalgia, unspecified: Principal | ICD-10-CM

## 2016-08-09 MED ORDER — OXYCODONE HCL ER 40 MG PO T12A: 40.0000 mg | EXTENDED_RELEASE_TABLET | Freq: Two times a day (BID) | ORAL | 0 refills | Status: DC

## 2016-08-09 MED ORDER — OXYCODONE HCL 20 MG PO TABS: 20.0000 mg | ORAL_TABLET | Freq: Three times a day (TID) | ORAL | 0 refills | Status: DC | PRN

## 2016-08-10 ENCOUNTER — Other Ambulatory Visit: Payer: BLUE CROSS/BLUE SHIELD

## 2016-08-10 ENCOUNTER — Ambulatory Visit
Admission: RE | Admit: 2016-08-10 | Discharge: 2016-08-10 | Disposition: A | Discharge status: Home / Self Care | Payer: BLUE CROSS/BLUE SHIELD | Source: Ambulatory Visit | Attending: Family Medicine | Admitting: Family Medicine

## 2016-08-10 DIAGNOSIS — R519 Headache, unspecified: Secondary | ICD-10-CM

## 2016-08-10 DIAGNOSIS — R51 Headache: Principal | ICD-10-CM

## 2016-08-10 MED ORDER — IOPAMIDOL (ISOVUE-300) INJECTION 61%
75.0000 mL | Freq: Once | INTRAVENOUS | Status: AC | PRN
Start: 2016-08-10 — End: 2016-08-10
  Administered 2016-08-10: 75 mL via INTRAVENOUS

## 2016-08-17 ENCOUNTER — Other Ambulatory Visit: Payer: Self-pay | Admitting: Gastroenterology

## 2016-08-17 DIAGNOSIS — R188 Other ascites: Principal | ICD-10-CM

## 2016-08-17 DIAGNOSIS — K7460 Unspecified cirrhosis of liver: Secondary | ICD-10-CM

## 2016-08-27 ENCOUNTER — Ambulatory Visit
Admission: RE | Admit: 2016-08-27 | Discharge: 2016-08-27 | Disposition: A | Discharge status: Home / Self Care | Payer: BLUE CROSS/BLUE SHIELD | Source: Ambulatory Visit | Attending: Gastroenterology | Admitting: Gastroenterology

## 2016-08-27 DIAGNOSIS — R188 Other ascites: Principal | ICD-10-CM

## 2016-08-27 DIAGNOSIS — K7460 Unspecified cirrhosis of liver: Secondary | ICD-10-CM

## 2016-09-08 ENCOUNTER — Other Ambulatory Visit: Payer: Self-pay | Admitting: *Deleted

## 2016-09-08 DIAGNOSIS — Z8572 Personal history of non-Hodgkin lymphomas: Secondary | ICD-10-CM

## 2016-09-08 MED ORDER — OXYCODONE HCL ER 40 MG PO T12A: 40.0000 mg | EXTENDED_RELEASE_TABLET | Freq: Two times a day (BID) | ORAL | 0 refills | Status: DC

## 2016-09-08 MED ORDER — OXYCODONE HCL 20 MG PO TABS: 20.0000 mg | ORAL_TABLET | Freq: Three times a day (TID) | ORAL | 0 refills | Status: DC | PRN

## 2016-09-09 MED FILL — oxyCODONE HCL ER 40 MG T12A: 40 | 30 days supply | Qty: 60 | Fill #0

## 2016-09-09 MED FILL — oxyCODONE HCL 20 MG TABS: 20 | 30 days supply | Qty: 90 | Fill #0

## 2016-10-04 ENCOUNTER — Other Ambulatory Visit: Payer: Self-pay | Admitting: *Deleted

## 2016-10-04 DIAGNOSIS — Z8572 Personal history of non-Hodgkin lymphomas: Secondary | ICD-10-CM

## 2016-10-05 MED ORDER — OXYCODONE HCL ER 40 MG PO T12A: 40.0000 mg | EXTENDED_RELEASE_TABLET | Freq: Two times a day (BID) | ORAL | 0 refills | Status: DC

## 2016-10-05 MED ORDER — OXYCODONE HCL 20 MG PO TABS: 20.0000 mg | ORAL_TABLET | Freq: Three times a day (TID) | ORAL | 0 refills | Status: DC | PRN

## 2016-11-02 ENCOUNTER — Encounter: Payer: Self-pay | Admitting: Podiatry

## 2016-11-02 ENCOUNTER — Ambulatory Visit (INDEPENDENT_AMBULATORY_CARE_PROVIDER_SITE_OTHER): Payer: BLUE CROSS/BLUE SHIELD | Admitting: Podiatry

## 2016-11-02 VITALS — Ht 72.0 in | Wt 300.0 lb

## 2016-11-02 DIAGNOSIS — M79671 Pain in right foot: Secondary | ICD-10-CM

## 2016-11-02 DIAGNOSIS — M79676 Pain in unspecified toe(s): Secondary | ICD-10-CM | POA: Diagnosis not present

## 2016-11-02 DIAGNOSIS — M79605 Pain in left leg: Secondary | ICD-10-CM

## 2016-11-02 DIAGNOSIS — B351 Tinea unguium: Secondary | ICD-10-CM | POA: Diagnosis not present

## 2016-11-02 DIAGNOSIS — M79604 Pain in right leg: Secondary | ICD-10-CM

## 2016-11-02 DIAGNOSIS — M79672 Pain in left foot: Secondary | ICD-10-CM

## 2016-11-02 NOTE — Progress Notes (Signed)
Patient ID: Albert James, male   DOB: 06/08/1956, 60 y.o.   MRN: [id number] Complaint:  Visit Type: Patient returns to my office for continued preventative foot care services. Complaint: Patient states" my nails have grown long and thick and become painful to walk and wear shoes" Patient is obese and unable to get to his feet. The patient presents for preventative foot care services. No changes to ROS  Podiatric Exam: Vascular: dorsalis pedis and posterior tibial pulses are not  palpable bilateral. Capillary return is immediate. Temperature gradient is WNL. Skin turgor WNL  Sensorium: Normal Semmes Weinstein monofilament test. Normal tactile sensation bilaterally. Nail Exam: Pt has thick disfigured discolored nails with subungual debris noted bilateral entire nail hallux through fifth toenails Ulcer Exam: There is no evidence of ulcer or pre-ulcerative changes or infection. Orthopedic Exam: Muscle tone and strength are WNL. No limitations in general ROM. No crepitus or effusions noted. Foot type and digits show no abnormalities. Bony prominences are unremarkable. Severe swelling in legs and feet. Skin: No Porokeratosis. No infection or ulcers  Diagnosis:  Onychomycosis, , Pain in right toe, pain in left toes  Treatment & Plan Procedures and Treatment: Consent by patient was obtained for treatment procedures. The patient understood the discussion of treatment and procedures well. All questions were answered thoroughly reviewed. Debridement of mycotic and hypertrophic toenails, 1 through 5 bilateral and clearing of subungual debris. No ulceration, no infection noted.  Return Visit-Office Procedure: Patient instructed to return to the office for a follow up visit 10 weeks  for continued evaluation and treatment.    Gardiner Barefoot DPM

## 2016-11-03 ENCOUNTER — Other Ambulatory Visit: Payer: Self-pay | Admitting: *Deleted

## 2016-11-03 DIAGNOSIS — Z8572 Personal history of non-Hodgkin lymphomas: Secondary | ICD-10-CM

## 2016-11-03 MED ORDER — OXYCODONE HCL 20 MG PO TABS: 20.0000 mg | ORAL_TABLET | Freq: Three times a day (TID) | ORAL | 0 refills | Status: DC | PRN

## 2016-11-03 MED ORDER — OXYCODONE HCL ER 40 MG PO T12A: 40.0000 mg | EXTENDED_RELEASE_TABLET | Freq: Two times a day (BID) | ORAL | 0 refills | Status: DC

## 2016-12-01 ENCOUNTER — Other Ambulatory Visit: Payer: Self-pay | Admitting: *Deleted

## 2016-12-01 DIAGNOSIS — Z8572 Personal history of non-Hodgkin lymphomas: Secondary | ICD-10-CM

## 2016-12-01 MED ORDER — OXYCODONE HCL ER 40 MG PO T12A: 40.0000 mg | EXTENDED_RELEASE_TABLET | Freq: Two times a day (BID) | ORAL | 0 refills | Status: DC

## 2016-12-01 MED ORDER — OXYCODONE HCL 20 MG PO TABS: 20.0000 mg | ORAL_TABLET | Freq: Three times a day (TID) | ORAL | 0 refills | Status: DC | PRN

## 2016-12-25 DIAGNOSIS — H5213 Myopia, bilateral: Secondary | ICD-10-CM | POA: Diagnosis not present

## 2017-01-03 ENCOUNTER — Other Ambulatory Visit: Payer: Self-pay

## 2017-01-03 DIAGNOSIS — Z8572 Personal history of non-Hodgkin lymphomas: Secondary | ICD-10-CM

## 2017-01-03 MED ORDER — OXYCODONE HCL ER 40 MG PO T12A: 40.0000 mg | EXTENDED_RELEASE_TABLET | Freq: Two times a day (BID) | ORAL | 0 refills | Status: DC

## 2017-01-03 MED ORDER — OXYCODONE HCL 20 MG PO TABS
20.0000 mg | ORAL_TABLET | Freq: Three times a day (TID) | ORAL | 0 refills | Status: DC | PRN
Start: 2017-01-03 — End: 2017-01-28

## 2017-01-24 ENCOUNTER — Other Ambulatory Visit: Payer: Self-pay | Admitting: *Deleted

## 2017-01-26 ENCOUNTER — Other Ambulatory Visit (HOSPITAL_BASED_OUTPATIENT_CLINIC_OR_DEPARTMENT_OTHER): Payer: Medicare Other

## 2017-01-26 ENCOUNTER — Ambulatory Visit (HOSPITAL_BASED_OUTPATIENT_CLINIC_OR_DEPARTMENT_OTHER): Payer: Medicare Other | Admitting: Family

## 2017-01-26 ENCOUNTER — Encounter: Payer: Self-pay | Admitting: Family

## 2017-01-26 VITALS — BP 140/71 | HR 81 | Temp 98.1°F | Resp 20 | Wt >= 6400 oz

## 2017-01-26 DIAGNOSIS — N3000 Acute cystitis without hematuria: Secondary | ICD-10-CM | POA: Diagnosis not present

## 2017-01-26 DIAGNOSIS — G8929 Other chronic pain: Secondary | ICD-10-CM

## 2017-01-26 DIAGNOSIS — I890 Lymphedema, not elsewhere classified: Secondary | ICD-10-CM | POA: Diagnosis not present

## 2017-01-26 DIAGNOSIS — I872 Venous insufficiency (chronic) (peripheral): Secondary | ICD-10-CM

## 2017-01-26 DIAGNOSIS — R109 Unspecified abdominal pain: Secondary | ICD-10-CM

## 2017-01-26 DIAGNOSIS — M549 Dorsalgia, unspecified: Secondary | ICD-10-CM

## 2017-01-26 DIAGNOSIS — Z8572 Personal history of non-Hodgkin lymphomas: Secondary | ICD-10-CM | POA: Diagnosis not present

## 2017-01-26 DIAGNOSIS — R10A1 Flank pain, right side: Secondary | ICD-10-CM

## 2017-01-26 DIAGNOSIS — Z23 Encounter for immunization: Secondary | ICD-10-CM

## 2017-01-26 LAB — URINALYSIS, MICROSCOPIC (CHCC SATELLITE)
Bilirubin (Urine): POSITIVE
Blood: NEGATIVE
Glucose: NEGATIVE mg/dL
Ketones: NEGATIVE mg/dL
LEUKOCYTE ESTERASE: NEGATIVE
Nitrite: NEGATIVE
Protein: NEGATIVE mg/dL
SPECIFIC GRAVITY, URINE: 1.015 (ref 1.003–1.035)
UROBILINOGEN UR: 0.2 mg/dL (ref 0.2–1)
pH: 5 (ref 4.60–8.00)

## 2017-01-26 LAB — CMP (CANCER CENTER ONLY)
ALK PHOS: 84 U/L (ref 26–84)
ALT: 23 U/L (ref 10–47)
AST: 22 U/L (ref 11–38)
Albumin: 3.9 g/dL (ref 3.3–5.5)
BILIRUBIN TOTAL: 0.8 mg/dL (ref 0.20–1.60)
BUN: 15 mg/dL (ref 7–22)
CALCIUM: 9.4 mg/dL (ref 8.0–10.3)
CO2: 30 mEq/L (ref 18–33)
CREATININE: 1.1 mg/dL (ref 0.6–1.2)
Chloride: 100 mEq/L (ref 98–108)
GLUCOSE: 86 mg/dL (ref 73–118)
Potassium: 4 mEq/L (ref 3.3–4.7)
Sodium: 142 mEq/L (ref 128–145)
Total Protein: 6.6 g/dL (ref 6.4–8.1)

## 2017-01-26 LAB — CBC WITH DIFFERENTIAL (CANCER CENTER ONLY)
BASO#: 0 10*3/uL (ref 0.0–0.2)
BASO%: 0.2 % (ref 0.0–2.0)
EOS%: 1.3 % (ref 0.0–7.0)
Eosinophils Absolute: 0.1 10*3/uL (ref 0.0–0.5)
HEMATOCRIT: 46.1 % (ref 38.7–49.9)
HGB: 15 g/dL (ref 13.0–17.1)
LYMPH#: 1.6 10*3/uL (ref 0.9–3.3)
LYMPH%: 25.1 % (ref 14.0–48.0)
MCH: 30.4 pg (ref 28.0–33.4)
MCHC: 32.5 g/dL (ref 32.0–35.9)
MCV: 93 fL (ref 82–98)
MONO#: 0.5 10*3/uL (ref 0.1–0.9)
MONO%: 8.5 % (ref 0.0–13.0)
NEUT#: 4.1 10*3/uL (ref 1.5–6.5)
NEUT%: 64.9 % (ref 40.0–80.0)
PLATELETS: 112 10*3/uL — AB (ref 145–400)
RBC: 4.94 10*6/uL (ref 4.20–5.70)
RDW: 15.8 % — AB (ref 11.1–15.7)
WBC: 6.3 10*3/uL (ref 4.0–10.0)

## 2017-01-26 LAB — LACTATE DEHYDROGENASE: LDH: 206 U/L (ref 125–245)

## 2017-01-26 NOTE — Progress Notes (Signed)
Hematology and Oncology Follow Up Visit  Albert James [id number] 01-16-1956 61 y.o. 01/26/2017   Principle Diagnosis:  1. Follicular small cell non-Hodgkin lymphoma, clinical remission 2. Morbid obesity 3. Lymphedema of his legs 4. Vertebral osteomyelitis  Current Therapy:   Observation    Interim History:  [name] is here today for follow-up. He is still feeling fatigued. He states that he has curbed his diet and portions and is hoping to lose some weight.  He has maintained a good appetite and is staying well hydrated. His weight remains in the 460's.   He states that with the cold weather and knee pain he has not been able to exercise.  He has had some left flank that comes and urine has had an odor. We will check a UA and C&S today to rule out UTI and result was negative. His counts today are stable. No anemia. WBC count is 6.3 and platelets 112.  He has had no episodes of bleeding, bruising or petechiae.   No fever, chills, n/v, cough, rash, dizziness, chest pain, palpitations or changes in bowel or bladder habits.  He has some SOB with over exertion and takes breaks to rest as needed.  No numbness or tingling in his extremities. He has significant lymphedema in both legs that does effect his mobility. He uses a walk when ambulating. No recent falls and no syncopal episodes.  His chronic back and knee pain is managed nicely with Oxycontin 40 mg every 12 hours and oxycodone 20 mg every 8 hours as needed for breakthrough pain. However, since the new year the price of his Oxycontin has increased quite a bit and he can not afford to keep paying the cost. We will look at his insurance and see what is affordable in extended release.   Medications:  Allergies as of 01/26/2017      Reactions   Vancomycin Other (See Comments)   "red man" syndrome (when in large quantity)      Medication List       Accurate as of 01/26/17  1:48 PM. Always use your most recent med list.            ALDACTONE 100 MG tablet Generic drug:  spironolactone Take 100 mg by mouth 2 (two) times daily.   B-complex with vitamin C tablet Take 1 tablet by mouth every morning.   docusate sodium 100 MG capsule Commonly known as:  COLACE Take 1 capsule (100 mg total) by mouth 2 (two) times daily as needed for mild constipation.   etodolac 400 MG tablet Commonly known as:  LODINE Take 400 mg by mouth 2 (two) times daily.   Fish Oil 1000 MG Caps Take 1 capsule by mouth 2 (two) times daily.   fluticasone 50 MCG/ACT nasal spray Commonly known as:  FLONASE Place 1 spray into the nose as needed for allergies.   furosemide 40 MG tablet Commonly known as:  LASIX Take 40 mg by mouth 2 (two) times daily.   glucosamine-chondroitin 500-400 MG tablet Take 2 tablets by mouth 2 (two) times daily.   lactulose 10 GM/15ML solution Commonly known as:  CHRONULAC Take 10 g by mouth as needed for mild constipation.   levothyroxine 50 MCG tablet Commonly known as:  SYNTHROID, LEVOTHROID TK 1 T PO D   nadolol 20 MG tablet Commonly known as:  CORGARD Take 20 mg by mouth every morning.   oxyCODONE 40 mg 12 hr tablet Commonly known as:  OXYCONTIN Take 1 tablet (  40 mg total) by mouth every 12 (twelve) hours.   Oxycodone HCl 20 MG Tabs Take 1 tablet (20 mg total) by mouth every 8 (eight) hours as needed.   VOLTAREN 1 % Gel Generic drug:  diclofenac sodium APPLY 1 GEL AS DIRECTED 4 ITMES A DAY   Diclofenac Sodium 1.5 % Soln       Allergies:  Allergies  Allergen Reactions  . Vancomycin Other (See Comments)    "red man" syndrome (when in large quantity)    Past Medical History, Surgical history, Social history, and Family History were reviewed and updated.  Review of Systems: All other 10 point review of systems is negative.   Physical Exam:  weight is 466 lb 6.4 oz (211.6 kg) (abnormal). His oral temperature is 98.1 F (36.7 C). His blood pressure is 140/71 and his pulse is  81. His respiration is 20 and oxygen saturation is 92%.   Wt Readings from Last 3 Encounters:  01/26/17 (!) 466 lb 6.4 oz (211.6 kg)  11/02/16 300 lb (136.1 kg)  08/03/16 300 lb (136.1 kg)    Ocular: Sclerae unicteric, pupils equal, round and reactive to light Ear-nose-throat: Oropharynx clear, dentition fair Lymphatic: No cervical supraclavicular or axillary adenopathy, has significant lymphedema of both lower extremities Lungs no rales or rhonchi, good excursion bilaterally Heart regular rate and rhythm, no murmur appreciated Abd soft, nontender, positive bowel sounds, no liver or spleen tip palpated on exam, no fluid wave MSK no focal spinal tenderness, no joint edema Neuro: non-focal, well-oriented, appropriate affect Breasts: Deferred   Lab Results  Component Value Date   WBC 6.3 01/26/2017   HGB 15.0 01/26/2017   HCT 46.1 01/26/2017   MCV 93 01/26/2017   PLT 112 (L) 01/26/2017   No results found for: FERRITIN, IRON, TIBC, UIBC, IRONPCTSAT Lab Results  Component Value Date   RBC 4.94 01/26/2017   No results found for: KPAFRELGTCHN, LAMBDASER, KAPLAMBRATIO No results found for: IGGSERUM, IGA, IGMSERUM No results found for: Odetta Pink, SPEI   Chemistry      Component Value Date/Time   NA 135 07/29/2016 1433   NA 145 01/16/2015 1451   K 4.0 07/29/2016 1433   K 4.1 01/16/2015 1451   CL 100 07/29/2016 1433   CL 102 01/16/2015 1451   CO2 29 07/29/2016 1433   CO2 32 01/16/2015 1451   BUN 15 07/29/2016 1433   BUN 13 01/16/2015 1451   CREATININE 0.92 07/29/2016 1433   CREATININE 1.1 01/16/2015 1451      Component Value Date/Time   CALCIUM 9.1 07/29/2016 1433   CALCIUM 9.3 01/16/2015 1451   ALKPHOS 90 07/29/2016 1433   ALKPHOS 74 01/16/2015 1451   AST 11 07/29/2016 1433   AST 17 01/16/2015 1451   ALT 11 07/29/2016 1433   ALT 19 01/16/2015 1451   BILITOT 0.4 07/29/2016 1433   BILITOT 0.70 01/16/2015 1451       Impression and Plan: Albert James is 61 yo caucasian gentleman with multiple health issues and history of follicular lymphoma over 12 years ago. So far this has not been an issue for him.  He has chronic fatigue and back/knee pain. His lymphadenopathy of the legs is quite impressive. This really limits his mobility.  His UA and C&S were negative. The rest of his lab work today was also stable.  He spoke with his insurance company and we have changed his long acting pain med to [facility] due to  cost.   We will continue to follow along with him and plan to see him back in 6 months for repeat lab work and follow-up.  He will contact our office with any questions or concerns. We can certainly see him sooner if need be.   Eliezer Bottom, NP 3/14/20181:48 PM

## 2017-01-27 LAB — URINE CULTURE

## 2017-01-28 ENCOUNTER — Other Ambulatory Visit: Payer: Self-pay | Admitting: *Deleted

## 2017-01-28 DIAGNOSIS — Z8572 Personal history of non-Hodgkin lymphomas: Secondary | ICD-10-CM

## 2017-01-28 MED ORDER — OXYCODONE ER 36 MG PO C12A: 36.0000 mg | EXTENDED_RELEASE_CAPSULE | Freq: Two times a day (BID) | ORAL | 0 refills | Status: DC

## 2017-01-28 MED ORDER — OXYCODONE HCL 20 MG PO TABS: 20.0000 mg | ORAL_TABLET | Freq: Three times a day (TID) | ORAL | 0 refills | Status: DC | PRN

## 2017-02-01 ENCOUNTER — Ambulatory Visit (INDEPENDENT_AMBULATORY_CARE_PROVIDER_SITE_OTHER): Payer: Medicare Other | Admitting: Podiatry

## 2017-02-01 ENCOUNTER — Encounter: Payer: Self-pay | Admitting: Podiatry

## 2017-02-01 DIAGNOSIS — B351 Tinea unguium: Secondary | ICD-10-CM | POA: Diagnosis not present

## 2017-02-01 DIAGNOSIS — M79604 Pain in right leg: Secondary | ICD-10-CM

## 2017-02-01 DIAGNOSIS — M79605 Pain in left leg: Secondary | ICD-10-CM

## 2017-02-01 NOTE — Progress Notes (Signed)
Patient ID: Albert James, male   DOB: [birth year], 61 y.o.   MRN: [id number] Complaint:  Visit Type: Patient returns to my office for continued preventative foot care services. Complaint: Patient states" my nails have grown long and thick and become painful to walk and wear shoes" Patient is obese and unable to get to his feet. The patient presents for preventative foot care services. No changes to ROS  Podiatric Exam: Vascular: dorsalis pedis and posterior tibial pulses are not  palpable bilateral. Capillary return is immediate. Temperature gradient is WNL. Skin turgor WNL  Sensorium: Normal Semmes Weinstein monofilament test. Normal tactile sensation bilaterally. Nail Exam: Pt has thick disfigured discolored nails with subungual debris noted bilateral entire nail hallux through fifth toenails Ulcer Exam: There is no evidence of ulcer or pre-ulcerative changes or infection. Orthopedic Exam: Muscle tone and strength are WNL. No limitations in general ROM. No crepitus or effusions noted. Foot type and digits show no abnormalities. Bony prominences are unremarkable. Severe swelling in legs and feet. Skin: No Porokeratosis. No infection or ulcers  Diagnosis:  Onychomycosis, , Pain in right toe, pain in left toes  Treatment & Plan Procedures and Treatment: Consent by patient was obtained for treatment procedures. The patient understood the discussion of treatment and procedures well. All questions were answered thoroughly reviewed. Debridement of mycotic and hypertrophic toenails, 1 through 5 bilateral and clearing of subungual debris. No ulceration, no infection noted.  Return Visit-Office Procedure: Patient instructed to return to the office for a follow up visit 10 weeks  for continued evaluation and treatment.    Gardiner Barefoot DPM

## 2017-02-02 DIAGNOSIS — M549 Dorsalgia, unspecified: Secondary | ICD-10-CM | POA: Diagnosis not present

## 2017-02-04 ENCOUNTER — Other Ambulatory Visit: Payer: Self-pay | Admitting: *Deleted

## 2017-02-08 ENCOUNTER — Ambulatory Visit
Admission: RE | Admit: 2017-02-08 | Discharge: 2017-02-08 | Disposition: A | Discharge status: Home / Self Care | Payer: Medicare Other | Source: Ambulatory Visit | Attending: Family Medicine | Admitting: Family Medicine

## 2017-02-08 ENCOUNTER — Other Ambulatory Visit: Payer: Self-pay | Admitting: Family Medicine

## 2017-02-08 DIAGNOSIS — M549 Dorsalgia, unspecified: Secondary | ICD-10-CM

## 2017-02-08 DIAGNOSIS — R109 Unspecified abdominal pain: Secondary | ICD-10-CM | POA: Diagnosis not present

## 2017-02-09 ENCOUNTER — Other Ambulatory Visit: Payer: Self-pay | Admitting: *Deleted

## 2017-02-09 MED ORDER — OXYCODONE HCL ER 40 MG PO T12A: 40.0000 mg | EXTENDED_RELEASE_TABLET | Freq: Two times a day (BID) | ORAL | 0 refills | Status: DC

## 2017-02-22 ENCOUNTER — Other Ambulatory Visit: Payer: Self-pay | Admitting: Gastroenterology

## 2017-02-22 DIAGNOSIS — K7460 Unspecified cirrhosis of liver: Secondary | ICD-10-CM

## 2017-02-28 ENCOUNTER — Ambulatory Visit
Admission: RE | Admit: 2017-02-28 | Discharge: 2017-02-28 | Disposition: A | Discharge status: Home / Self Care | Payer: Medicare Other | Source: Ambulatory Visit | Attending: Gastroenterology | Admitting: Gastroenterology

## 2017-02-28 DIAGNOSIS — K7460 Unspecified cirrhosis of liver: Secondary | ICD-10-CM | POA: Diagnosis not present

## 2017-03-02 ENCOUNTER — Other Ambulatory Visit: Payer: Self-pay | Admitting: *Deleted

## 2017-03-02 DIAGNOSIS — Z8572 Personal history of non-Hodgkin lymphomas: Secondary | ICD-10-CM

## 2017-03-02 MED ORDER — OXYCODONE HCL 20 MG PO TABS: 20.0000 mg | ORAL_TABLET | Freq: Three times a day (TID) | ORAL | 0 refills | Status: DC | PRN

## 2017-03-08 DIAGNOSIS — E782 Mixed hyperlipidemia: Secondary | ICD-10-CM | POA: Diagnosis not present

## 2017-03-08 DIAGNOSIS — K7460 Unspecified cirrhosis of liver: Secondary | ICD-10-CM | POA: Diagnosis not present

## 2017-03-08 DIAGNOSIS — E039 Hypothyroidism, unspecified: Secondary | ICD-10-CM | POA: Diagnosis not present

## 2017-03-08 DIAGNOSIS — Z0001 Encounter for general adult medical examination with abnormal findings: Secondary | ICD-10-CM | POA: Diagnosis not present

## 2017-03-09 ENCOUNTER — Other Ambulatory Visit: Payer: Self-pay | Admitting: *Deleted

## 2017-03-09 MED ORDER — OXYCODONE HCL ER 40 MG PO T12A: 40.0000 mg | EXTENDED_RELEASE_TABLET | Freq: Two times a day (BID) | ORAL | 0 refills | Status: DC

## 2017-03-28 ENCOUNTER — Other Ambulatory Visit: Payer: Self-pay | Admitting: *Deleted

## 2017-03-28 DIAGNOSIS — Z8572 Personal history of non-Hodgkin lymphomas: Secondary | ICD-10-CM

## 2017-03-28 MED ORDER — OXYCODONE HCL 20 MG PO TABS: 20.0000 mg | ORAL_TABLET | Freq: Three times a day (TID) | ORAL | 0 refills | Status: DC | PRN

## 2017-04-04 ENCOUNTER — Other Ambulatory Visit: Payer: Self-pay | Admitting: *Deleted

## 2017-04-04 MED ORDER — OXYCODONE HCL ER 40 MG PO T12A: 40.0000 mg | EXTENDED_RELEASE_TABLET | Freq: Two times a day (BID) | ORAL | 0 refills | Status: DC

## 2017-04-12 ENCOUNTER — Encounter: Payer: Self-pay | Admitting: Podiatry

## 2017-04-12 ENCOUNTER — Ambulatory Visit (INDEPENDENT_AMBULATORY_CARE_PROVIDER_SITE_OTHER): Payer: Medicare Other | Admitting: Podiatry

## 2017-04-12 DIAGNOSIS — B351 Tinea unguium: Secondary | ICD-10-CM

## 2017-04-12 DIAGNOSIS — M79676 Pain in unspecified toe(s): Secondary | ICD-10-CM

## 2017-04-12 NOTE — Progress Notes (Signed)
Patient ID: Albert James, male   DOB: February 15, 1956, 61 y.o.   MRN: [id number] Complaint:  Visit Type: Patient returns to my office for continued preventative foot care services. Complaint: Patient states" my nails have grown long and thick and become painful to walk and wear shoes" Patient is obese and unable to get to his feet. The patient presents for preventative foot care services. No changes to ROS  Podiatric Exam: Vascular: dorsalis pedis and posterior tibial pulses are not  palpable bilateral. Capillary return is immediate. Temperature gradient is WNL. Skin turgor WNL  Sensorium: Normal Semmes Weinstein monofilament test. Normal tactile sensation bilaterally. Nail Exam: Pt has thick disfigured discolored nails with subungual debris noted bilateral entire nail hallux through fifth toenails Ulcer Exam: There is no evidence of ulcer or pre-ulcerative changes or infection. Orthopedic Exam: Muscle tone and strength are WNL. No limitations in general ROM. No crepitus or effusions noted. Foot type and digits show no abnormalities. Bony prominences are unremarkable. Severe swelling in legs and feet. Skin: No Porokeratosis. No infection or ulcers  Diagnosis:  Onychomycosis, , Pain in right toe, pain in left toes  Treatment & Plan Procedures and Treatment: Consent by patient was obtained for treatment procedures. The patient understood the discussion of treatment and procedures well. All questions were answered thoroughly reviewed. Debridement of mycotic and hypertrophic toenails, 1 through 5 bilateral and clearing of subungual debris. No ulceration, no infection noted.  Return Visit-Office Procedure: Patient instructed to return to the office for a follow up visit 10 weeks  for continued evaluation and treatment.    Gardiner Barefoot DPM

## 2017-04-25 ENCOUNTER — Other Ambulatory Visit: Payer: Self-pay | Admitting: *Deleted

## 2017-04-25 DIAGNOSIS — Z8572 Personal history of non-Hodgkin lymphomas: Secondary | ICD-10-CM

## 2017-04-25 MED ORDER — OXYCODONE HCL 20 MG PO TABS: 20.0000 mg | ORAL_TABLET | Freq: Three times a day (TID) | ORAL | 0 refills | Status: DC | PRN

## 2017-05-09 ENCOUNTER — Other Ambulatory Visit: Payer: Self-pay | Admitting: *Deleted

## 2017-05-09 MED ORDER — OXYCODONE HCL ER 40 MG PO T12A: 40.0000 mg | EXTENDED_RELEASE_TABLET | Freq: Two times a day (BID) | ORAL | 0 refills | Status: DC

## 2017-05-24 ENCOUNTER — Other Ambulatory Visit: Payer: Self-pay | Admitting: *Deleted

## 2017-05-24 DIAGNOSIS — Z8572 Personal history of non-Hodgkin lymphomas: Secondary | ICD-10-CM

## 2017-05-24 MED ORDER — OXYCODONE HCL 20 MG PO TABS: 20.0000 mg | ORAL_TABLET | Freq: Three times a day (TID) | ORAL | 0 refills | Status: DC | PRN

## 2017-06-06 ENCOUNTER — Other Ambulatory Visit: Payer: Self-pay | Admitting: *Deleted

## 2017-06-06 MED ORDER — OXYCODONE HCL ER 40 MG PO T12A: 40.0000 mg | EXTENDED_RELEASE_TABLET | Freq: Two times a day (BID) | ORAL | 0 refills | Status: DC

## 2017-06-20 ENCOUNTER — Other Ambulatory Visit: Payer: Self-pay | Admitting: *Deleted

## 2017-06-20 DIAGNOSIS — Z8572 Personal history of non-Hodgkin lymphomas: Secondary | ICD-10-CM

## 2017-06-20 MED ORDER — OXYCODONE HCL 20 MG PO TABS: 20.0000 mg | ORAL_TABLET | Freq: Three times a day (TID) | ORAL | 0 refills | Status: DC | PRN

## 2017-06-21 ENCOUNTER — Ambulatory Visit (INDEPENDENT_AMBULATORY_CARE_PROVIDER_SITE_OTHER): Payer: Medicare Other | Admitting: Podiatry

## 2017-06-21 ENCOUNTER — Encounter: Payer: Self-pay | Admitting: Podiatry

## 2017-06-21 DIAGNOSIS — M79676 Pain in unspecified toe(s): Secondary | ICD-10-CM | POA: Diagnosis not present

## 2017-06-21 DIAGNOSIS — B351 Tinea unguium: Secondary | ICD-10-CM | POA: Diagnosis not present

## 2017-06-21 NOTE — Progress Notes (Signed)
Patient ID: Albert James, male   DOB: [birth year], 61 y.o.   MRN: [id number] Complaint:  Visit Type: Patient returns to my office for continued preventative foot care services. Complaint: Patient states" my nails have grown long and thick and become painful to walk and wear shoes" Patient is obese and unable to get to his feet. The patient presents for preventative foot care services. No changes to ROS  Podiatric Exam: Vascular: dorsalis pedis and posterior tibial pulses are not  palpable bilateral. Capillary return is immediate. Temperature gradient is WNL. Skin turgor WNL  Sensorium: Normal Semmes Weinstein monofilament test. Normal tactile sensation bilaterally. Nail Exam: Pt has thick disfigured discolored nails with subungual debris noted bilateral entire nail hallux through fifth toenails Ulcer Exam: There is no evidence of ulcer or pre-ulcerative changes or infection. Orthopedic Exam: Muscle tone and strength are WNL. No limitations in general ROM. No crepitus or effusions noted. Foot type and digits show no abnormalities. Bony prominences are unremarkable. Severe swelling in legs and feet. Skin: No Porokeratosis. No infection or ulcers  Diagnosis:  Onychomycosis, , Pain in right toe, pain in left toes  Treatment & Plan Procedures and Treatment: Consent by patient was obtained for treatment procedures. The patient understood the discussion of treatment and procedures well. All questions were answered thoroughly reviewed. Debridement of mycotic and hypertrophic toenails, 1 through 5 bilateral and clearing of subungual debris. No ulceration, no infection noted.  Return Visit-Office Procedure: Patient instructed to return to the office for a follow up visit 10 weeks  for continued evaluation and treatment.    Gardiner Barefoot DPM

## 2017-06-27 ENCOUNTER — Other Ambulatory Visit: Payer: Self-pay | Admitting: *Deleted

## 2017-06-27 MED ORDER — OXYCODONE HCL ER 40 MG PO T12A: 40.0000 mg | EXTENDED_RELEASE_TABLET | Freq: Two times a day (BID) | ORAL | 0 refills | Status: DC

## 2017-07-19 ENCOUNTER — Other Ambulatory Visit: Payer: Self-pay | Admitting: *Deleted

## 2017-07-19 DIAGNOSIS — Z8572 Personal history of non-Hodgkin lymphomas: Secondary | ICD-10-CM

## 2017-07-19 MED ORDER — OXYCODONE HCL ER 40 MG PO T12A
40.0000 mg | EXTENDED_RELEASE_TABLET | Freq: Two times a day (BID) | ORAL | 0 refills | Status: DC
Start: 2017-07-19 — End: 2017-08-15

## 2017-07-19 MED ORDER — OXYCODONE HCL 20 MG PO TABS: 20.0000 mg | ORAL_TABLET | Freq: Three times a day (TID) | ORAL | 0 refills | Status: DC | PRN

## 2017-07-29 ENCOUNTER — Other Ambulatory Visit (HOSPITAL_BASED_OUTPATIENT_CLINIC_OR_DEPARTMENT_OTHER): Payer: Medicare Other

## 2017-07-29 ENCOUNTER — Ambulatory Visit (HOSPITAL_BASED_OUTPATIENT_CLINIC_OR_DEPARTMENT_OTHER): Payer: Medicare Other | Admitting: Hematology & Oncology

## 2017-07-29 VITALS — BP 134/74 | HR 72 | Temp 98.2°F | Resp 20 | Wt >= 6400 oz

## 2017-07-29 DIAGNOSIS — M549 Dorsalgia, unspecified: Principal | ICD-10-CM

## 2017-07-29 DIAGNOSIS — Z7982 Long term (current) use of aspirin: Secondary | ICD-10-CM

## 2017-07-29 DIAGNOSIS — G8929 Other chronic pain: Secondary | ICD-10-CM

## 2017-07-29 DIAGNOSIS — K7581 Nonalcoholic steatohepatitis (NASH): Secondary | ICD-10-CM | POA: Diagnosis not present

## 2017-07-29 DIAGNOSIS — I482 Chronic atrial fibrillation: Secondary | ICD-10-CM | POA: Diagnosis not present

## 2017-07-29 DIAGNOSIS — M4620 Osteomyelitis of vertebra, site unspecified: Secondary | ICD-10-CM

## 2017-07-29 DIAGNOSIS — Z8572 Personal history of non-Hodgkin lymphomas: Secondary | ICD-10-CM

## 2017-07-29 DIAGNOSIS — I890 Lymphedema, not elsewhere classified: Secondary | ICD-10-CM | POA: Diagnosis not present

## 2017-07-29 DIAGNOSIS — D696 Thrombocytopenia, unspecified: Secondary | ICD-10-CM | POA: Diagnosis not present

## 2017-07-29 DIAGNOSIS — N281 Cyst of kidney, acquired: Secondary | ICD-10-CM

## 2017-07-29 DIAGNOSIS — I4819 Other persistent atrial fibrillation: Secondary | ICD-10-CM

## 2017-07-29 LAB — COMPREHENSIVE METABOLIC PANEL (CC13)
A/G RATIO: 1.8 (ref 1.2–2.2)
ALBUMIN: 4.1 g/dL (ref 3.6–4.8)
ALT: 21 IU/L (ref 0–44)
AST (SGOT): 16 IU/L (ref 0–40)
Alkaline Phosphatase, S: 88 IU/L (ref 39–117)
BILIRUBIN TOTAL: 0.9 mg/dL (ref 0.0–1.2)
BUN / CREAT RATIO: 14 (ref 10–24)
BUN: 15 mg/dL (ref 8–27)
CHLORIDE: 102 mmol/L (ref 96–106)
Calcium, Ser: 9.4 mg/dL (ref 8.6–10.2)
Carbon Dioxide, Total: 30 mmol/L — ABNORMAL HIGH (ref 20–29)
Creatinine, Ser: 1.04 mg/dL (ref 0.76–1.27)
GFR calc non Af Amer: 76 mL/min/{1.73_m2} (ref >59)
GFR, EST AFRICAN AMERICAN: 88 mL/min/{1.73_m2} (ref >59)
Globulin, Total: 2.3 g/dL (ref 1.5–4.5)
Glucose: 133 mg/dL — ABNORMAL HIGH (ref 65–99)
POTASSIUM: 4.3 mmol/L (ref 3.5–5.2)
SODIUM: 140 mmol/L (ref 134–144)
TOTAL PROTEIN: 6.4 g/dL (ref 6.0–8.5)

## 2017-07-29 LAB — CBC WITH DIFFERENTIAL (CANCER CENTER ONLY)
BASO#: 0 10*3/uL (ref 0.0–0.2)
BASO%: 0.3 % (ref 0.0–2.0)
EOS ABS: 0.1 10*3/uL (ref 0.0–0.5)
EOS%: 1.7 % (ref 0.0–7.0)
HCT: 45.3 % (ref 38.7–49.9)
HGB: 14.4 g/dL (ref 13.0–17.1)
LYMPH#: 1.4 10*3/uL (ref 0.9–3.3)
LYMPH%: 21.3 % (ref 14.0–48.0)
MCH: 30.3 pg (ref 28.0–33.4)
MCHC: 31.8 g/dL — AB (ref 32.0–35.9)
MCV: 95 fL (ref 82–98)
MONO#: 0.6 10*3/uL (ref 0.1–0.9)
MONO%: 9.6 % (ref 0.0–13.0)
NEUT%: 67.1 % (ref 40.0–80.0)
NEUTROS ABS: 4.4 10*3/uL (ref 1.5–6.5)
PLATELETS: 105 10*3/uL — AB (ref 145–400)
RBC: 4.75 10*6/uL (ref 4.20–5.70)
RDW: 17.1 % — ABNORMAL HIGH (ref 11.1–15.7)
WBC: 6.6 10*3/uL (ref 4.0–10.0)

## 2017-07-29 LAB — LACTATE DEHYDROGENASE: LDH: 199 U/L (ref 125–245)

## 2017-07-29 NOTE — Progress Notes (Signed)
Hematology and Oncology Follow Up Visit  Albert James [id number] 1956-02-14 61 y.o. 07/29/2017   Principle Diagnosis:  1. Follicular small cell non-Hodgkin lymphoma, clinical remission. 2. Morbid obesity. 3. Lymphedema of his legs. 4. Vertebral osteomyelitis.   Current Therapy:    Observation     Interim History:  [name] is back for followup. We see him every 6 months. He is doing fairly well. He's had no problems with nausea or vomiting. He's had no problems with rashes. He has had horrible problems with his knees. He really needs knee surgery as he has bone on bone degeneration. However, he cannot have the surgery because of his other health issues. He has chronic atrial fibrillation. He is only on aspirin for this.  He has steatohepatitis. He does have some thrombocytopenia but this has never been an issue for him. He's had no bleeding.  He apparently has renal cysts. He had a ultrasound of his kidneys back in April. The ultrasound showed 3 cysts on his right kidney. Nothing was made or mention as to whether or not these were complex cysts.  He's had no fever. He's had no obvious change in bowel or bladder habits.  His OxyContin and oxycodone have helped quite a bit with his chronic back pain.  I wish he could qualify for weight loss surgery, but he just has too many other concurrent health issues that would make surgery dangerous for him.  Overall, his performance status is ECOG 3.   Medications:  Current Outpatient Prescriptions:  .  B Complex-C (B-COMPLEX WITH VITAMIN C) tablet, Take 1 tablet by mouth every morning. , Disp: , Rfl:  .  Diclofenac Sodium 1.5 % SOLN, , Disp: , Rfl: 2 .  docusate sodium (COLACE) 100 MG capsule, Take 1 capsule (100 mg total) by mouth 2 (two) times daily as needed for mild constipation., Disp: 20 capsule, Rfl: 1 .  etodolac (LODINE) 400 MG tablet, Take 400 mg by mouth 2 (two) times daily., Disp: , Rfl:  .  fluticasone (FLONASE)  50 MCG/ACT nasal spray, Place 1 spray into the nose as needed for allergies. , Disp: , Rfl:  .  furosemide (LASIX) 40 MG tablet, Take 40 mg by mouth 2 (two) times daily.  , Disp: , Rfl:  .  glucosamine-chondroitin 500-400 MG tablet, Take 2 tablets by mouth 2 (two) times daily., Disp: , Rfl:  .  lactulose (CHRONULAC) 10 GM/15ML solution, Take 10 g by mouth as needed for mild constipation. , Disp: , Rfl:  .  levothyroxine (SYNTHROID, LEVOTHROID) 125 MCG tablet, , Disp: , Rfl: 0 .  nadolol (CORGARD) 20 MG tablet, Take 20 mg by mouth every morning. , Disp: , Rfl:  .  omega-3 acid ethyl esters (LOVAZA) 1 g capsule, , Disp: , Rfl: 0 .  Omega-3 Fatty Acids (FISH OIL) 1000 MG CAPS, Take 1 capsule by mouth 2 (two) times daily. , Disp: , Rfl:  .  oxyCODONE (OXYCONTIN) 40 mg 12 hr tablet, Take 1 tablet (40 mg total) by mouth every 12 (twelve) hours. Do not fill until 07/25/2017, Disp: 60 tablet, Rfl: 0 .  Oxycodone HCl 20 MG TABS, Take 1 tablet (20 mg total) by mouth every 8 (eight) hours as needed., Disp: 90 tablet, Rfl: 0 .  spironolactone (ALDACTONE) 100 MG tablet, Take 100 mg by mouth 2 (two) times daily.  , Disp: , Rfl:  .  VOLTAREN 1 % GEL, APPLY 1 GEL AS DIRECTED 4 ITMES A DAY, Disp: ,  Rfl: 2  Allergies:  Allergies  Allergen Reactions  . Vancomycin Other (See Comments)    "red man" syndrome (when in large quantity)    Past Medical History, Surgical history, Social history, and Family History were reviewed and updated.  Review of Systems: As stated in the interim history  Physical Exam:  weight is 477 lb (216.4 kg) (abnormal). His oral temperature is 98.2 F (36.8 C). His blood pressure is 134/74 and his pulse is 72. His respiration is 20 and oxygen saturation is 87% (abnormal).   Albert James obese white male. Head and neck exam shows no ocular or oral lesions. There is no scleral icterus. He has no adenopathy in the neck. There is no palatal icterus. Lungs are with decreased at the bases. Cardiac  exam irregular rate and rhythm consistent with atrial fibrillation. His rate is well controlled. There are no murmurs, rubs or bruits. Abdomen is morbidly obese. He has no fluid wave. There is no guarding or rebound tenderness. He has no obvious hepatomegaly. I cannot feel his spleen tip. Back exam shows some tenderness over the lumbar spine to palpation. Extremities shows marked lymphedema. He has both legs wrapped up to his knees. He has crepitus with range of motion of his knees. Skin exam shows no rashes, ecchymoses or petechia. Neurological exam is nonfocal.   Lab Results  Component Value Date   WBC 6.6 07/29/2017   HGB 14.4 07/29/2017   HCT 45.3 07/29/2017   MCV 95 07/29/2017   PLT 105 (L) 07/29/2017     Chemistry      Component Value Date/Time   NA 142 01/26/2017 1309   K 4.0 01/26/2017 1309   CL 100 01/26/2017 1309   CO2 30 01/26/2017 1309   BUN 15 01/26/2017 1309   CREATININE 1.1 01/26/2017 1309      Component Value Date/Time   CALCIUM 9.4 01/26/2017 1309   ALKPHOS 84 01/26/2017 1309   AST 22 01/26/2017 1309   ALT 23 01/26/2017 1309   BILITOT 0.80 01/26/2017 1309         Impression and Plan: [name] is 61 year old white male. He has a remote history of follicular small cell non-Hodgkin's lymphoma. This really has not been an issue for him for probably 13 years.  His weight and arthritic issues are his biggest problem.  I told him that I would be more than happy to help out with follow-up with his renal cyst. He has the renal cysts on the right kidney. He will think about this.  I will like to see him back in 6 more months.  It would be wonderful if he could have weight loss surgery. A Albert James with the issue is, outside of the fact that he has atrial fibrillation.  Volanda Napoleon, MD 9/14/20181:53 PM

## 2017-08-15 ENCOUNTER — Other Ambulatory Visit: Payer: Self-pay | Admitting: *Deleted

## 2017-08-15 DIAGNOSIS — Z8572 Personal history of non-Hodgkin lymphomas: Secondary | ICD-10-CM

## 2017-08-15 MED ORDER — OXYCODONE HCL 20 MG PO TABS: 20.0000 mg | ORAL_TABLET | Freq: Three times a day (TID) | ORAL | 0 refills | Status: DC | PRN

## 2017-08-15 MED ORDER — OXYCODONE HCL ER 40 MG PO T12A
40.0000 mg | EXTENDED_RELEASE_TABLET | Freq: Two times a day (BID) | ORAL | 0 refills | Status: DC
Start: 2017-08-15 — End: 2017-09-12

## 2017-08-30 ENCOUNTER — Ambulatory Visit (INDEPENDENT_AMBULATORY_CARE_PROVIDER_SITE_OTHER): Payer: Medicare Other | Admitting: Podiatry

## 2017-08-30 DIAGNOSIS — B351 Tinea unguium: Secondary | ICD-10-CM

## 2017-08-30 DIAGNOSIS — M79676 Pain in unspecified toe(s): Secondary | ICD-10-CM

## 2017-08-30 NOTE — Progress Notes (Signed)
Patient ID: Albert James, male   DOB: 1956-07-11, 61 y.o.   MRN: [id number] Complaint:  Visit Type: Patient returns to my office for continued preventative foot care services. Complaint: Patient states" my nails have grown long and thick and become painful to walk and wear shoes" Patient is obese and unable to get to his feet. The patient presents for preventative foot care services. No changes to ROS  Podiatric Exam: Vascular: dorsalis pedis and posterior tibial pulses are not  palpable bilateral. Capillary return is immediate. Temperature gradient is WNL. Skin turgor WNL  Sensorium: Normal Semmes Weinstein monofilament test. Normal tactile sensation bilaterally. Nail Exam: Pt has thick disfigured discolored nails with subungual debris noted bilateral entire nail hallux through fifth toenails Ulcer Exam: There is no evidence of ulcer or pre-ulcerative changes or infection. Orthopedic Exam: Muscle tone and strength are WNL. No limitations in general ROM. No crepitus or effusions noted. Foot type and digits show no abnormalities. Bony prominences are unremarkable. Severe swelling in legs and feet. Skin: No Porokeratosis. No infection or ulcers  Diagnosis:  Onychomycosis, , Pain in right toe, pain in left toes  Treatment & Plan Procedures and Treatment: Consent by patient was obtained for treatment procedures. The patient understood the discussion of treatment and procedures well. All questions were answered thoroughly reviewed. Debridement of mycotic and hypertrophic toenails, 1 through 5 bilateral and clearing of subungual debris. No ulceration, no infection noted.  Return Visit-Office Procedure: Patient instructed to return to the office for a follow up visit 10 weeks  for continued evaluation and treatment.    Gardiner Barefoot DPM

## 2017-09-05 ENCOUNTER — Other Ambulatory Visit: Payer: Self-pay | Admitting: Gastroenterology

## 2017-09-05 DIAGNOSIS — K7460 Unspecified cirrhosis of liver: Secondary | ICD-10-CM | POA: Diagnosis not present

## 2017-09-05 DIAGNOSIS — K7469 Other cirrhosis of liver: Secondary | ICD-10-CM

## 2017-09-08 ENCOUNTER — Other Ambulatory Visit: Payer: Self-pay | Admitting: Family Medicine

## 2017-09-08 ENCOUNTER — Ambulatory Visit
Admission: RE | Admit: 2017-09-08 | Discharge: 2017-09-08 | Disposition: A | Discharge status: Home / Self Care | Payer: Medicare Other | Source: Ambulatory Visit | Attending: Family Medicine | Admitting: Family Medicine

## 2017-09-08 DIAGNOSIS — C8590 Non-Hodgkin lymphoma, unspecified, unspecified site: Secondary | ICD-10-CM | POA: Diagnosis not present

## 2017-09-08 DIAGNOSIS — R0602 Shortness of breath: Secondary | ICD-10-CM | POA: Diagnosis not present

## 2017-09-08 DIAGNOSIS — K7460 Unspecified cirrhosis of liver: Secondary | ICD-10-CM | POA: Diagnosis not present

## 2017-09-08 DIAGNOSIS — E782 Mixed hyperlipidemia: Secondary | ICD-10-CM | POA: Diagnosis not present

## 2017-09-09 ENCOUNTER — Telehealth: Payer: Self-pay | Admitting: Cardiology

## 2017-09-09 NOTE — Telephone Encounter (Signed)
Received records from [place] for appointment on 10/04/17 with Dr Martinique.  Records put with Dr Doug Sou schedule for 10/04/17. lp

## 2017-09-12 ENCOUNTER — Other Ambulatory Visit: Payer: Self-pay | Admitting: *Deleted

## 2017-09-12 ENCOUNTER — Ambulatory Visit
Admission: RE | Admit: 2017-09-12 | Discharge: 2017-09-12 | Disposition: A | Discharge status: Home / Self Care | Payer: Medicare Other | Source: Ambulatory Visit | Attending: Gastroenterology | Admitting: Gastroenterology

## 2017-09-12 DIAGNOSIS — K7460 Unspecified cirrhosis of liver: Secondary | ICD-10-CM | POA: Diagnosis not present

## 2017-09-12 DIAGNOSIS — K7469 Other cirrhosis of liver: Secondary | ICD-10-CM

## 2017-09-12 DIAGNOSIS — Z8572 Personal history of non-Hodgkin lymphomas: Secondary | ICD-10-CM

## 2017-09-12 MED ORDER — OXYCODONE HCL ER 40 MG PO T12A: 40.0000 mg | EXTENDED_RELEASE_TABLET | Freq: Two times a day (BID) | ORAL | 0 refills | Status: DC

## 2017-09-12 MED ORDER — OXYCODONE HCL 20 MG PO TABS: 20.0000 mg | ORAL_TABLET | Freq: Three times a day (TID) | ORAL | 0 refills | Status: DC | PRN

## 2017-09-20 ENCOUNTER — Ambulatory Visit: Payer: Medicare Other | Admitting: Cardiology

## 2017-09-20 ENCOUNTER — Encounter: Payer: Self-pay | Admitting: Cardiology

## 2017-09-20 VITALS — BP 122/85 | HR 66 | Ht 71.0 in | Wt >= 6400 oz

## 2017-09-20 DIAGNOSIS — I481 Persistent atrial fibrillation: Secondary | ICD-10-CM | POA: Diagnosis not present

## 2017-09-20 DIAGNOSIS — I4819 Other persistent atrial fibrillation: Secondary | ICD-10-CM

## 2017-09-20 NOTE — Patient Instructions (Signed)
We will schedule you for an overnight oximetry test and an Echocardiogram

## 2017-09-20 NOTE — Progress Notes (Signed)
Albert James Date of Birth: 1956/06/15 Medical Record #[id number]  History of Present Illness: Albert James is seen at the request of Dr. Mayra Neer to reestablish cardiac care. He was last seen in August 2015. He has a history of atrial fibrillation,  non-Hodgkin's lymphoma, cirrhosis, HLD, and morbid obesity.  As part of a pre op evaluation in 2015 he had an Ecg that showed atrial fibrillation with a controlled rate. No further treatment pursued. [place] vasc of 0 so not anticoagulated. Echo showed moderate pulmonary HTN with LAE and normal LV function.   On follow up today he complains primarily of worsening dyspnea and fatigue. Does not sleep well at night. Family notes he snores a lot and has periods of apnea. He is reluctant to pursue sleep evaluation. Was noted in September to have oxygen sat of 87% at rest in oncology office. He has chronic severe LE edema. Wears leg wraps. Had Abdominal US last week showing no ascites. Denies any bleeding. Disabled due to arthritis in his knees. He is on nadolol for cirrhosis (not a cardiac indication).   Allergies as of 09/20/2017      Reactions   Vancomycin Other (See Comments)   "red man" syndrome (when in large quantity)      Medication List        Accurate as of 09/20/17  2:58 PM. Always use your most recent med list.          ALDACTONE 100 MG tablet Generic drug:  spironolactone Take 100 mg by mouth 2 (two) times daily.   B-complex with vitamin C tablet Take 1 tablet by mouth every morning.   docusate sodium 100 MG capsule Commonly known as:  COLACE Take 1 capsule (100 mg total) by mouth 2 (two) times daily as needed for mild constipation.   etodolac 400 MG tablet Commonly known as:  LODINE Take 400 mg by mouth 2 (two) times daily.   Fish Oil 1000 MG Caps Take 1 capsule by mouth 2 (two) times daily.   fluticasone 50 MCG/ACT nasal spray Commonly known as:  FLONASE Place 1 spray into the nose as needed for allergies.   furosemide 40 MG tablet Commonly known as:  LASIX Take 40 mg by mouth 2 (two) times daily.   glucosamine-chondroitin 500-400 MG tablet Take 2 tablets by mouth 2 (two) times daily.   lactulose 10 GM/15ML solution Commonly known as:  CHRONULAC Take 10 g by mouth as needed for mild constipation.   levothyroxine 125 MCG tablet Commonly known as:  SYNTHROID, LEVOTHROID   nadolol 20 MG tablet Commonly known as:  CORGARD Take 20 mg by mouth every morning.   omega-3 acid ethyl esters 1 g capsule Commonly known as:  LOVAZA   oxyCODONE 40 mg 12 hr tablet Commonly known as:  OXYCONTIN Take 1 tablet (40 mg total) by mouth every 12 (twelve) hours. Do not fill until 07/25/2017   Oxycodone HCl 20 MG Tabs Take 1 tablet (20 mg total) by mouth every 8 (eight) hours as needed.   VOLTAREN 1 % Gel Generic drug:  diclofenac sodium APPLY 1 GEL AS DIRECTED 4 ITMES A DAY   Diclofenac Sodium 1.5 % Soln       Allergies  Allergen Reactions  . Vancomycin Other (See Comments)    "red man" syndrome (when in large quantity)    Past Medical History:  Diagnosis Date  . Abdominal hernia    can' t  fix per pt, it is large  .  Anxiety   . Arthritis    lower back  . Atrial fibrillation, persistent ([place]) 07/08/2014  . Back pain   . Cirrhosis ([place])    hx of   . Depression   . Knee pain both knees   wears compression due to edema  . nhl; dx'd 2006   chemo comp, lymphoma  . Non Hodgkin's lymphoma ([place])    hx of   . Peripheral vascular disease ([place])   . Shoulder pain   . Swelling of lower extremity    both legs, uses compression wraps all the time  . Ulcer    left leg, now healed  . Varicose veins     Past Surgical History:  Procedure Laterality Date  . APPENDECTOMY    . biopsy on back at disc     . ENDOVENOUS ABLATION SAPHENOUS VEIN W/ LASER Left 06-10-2014   EVLA  Left greater saphenous vein by Curt Jews MD    . HERNIA REPAIR  bdomen as child umbilical as adult   x 2   . left  elbow surgery      pin out then pin removed   . PORTACATH PLACEMENT  2000   placed and removed  . portacath removal       Social History   Socioeconomic History  . Marital status: Divorced    Spouse name: None  . Number of children: 2  . Years of education: None  . Highest education level: None  Social Needs  . Financial resource strain: None  . Food insecurity - worry: None  . Food insecurity - inability: None  . Transportation needs - medical: None  . Transportation needs - non-medical: None  Occupational History  . Occupation: disabled  Tobacco Use  . Smoking status: Former Smoker    Packs/day: 0.50    Years: 8.00    Pack years: 4.00    Types: Cigarettes    Start date: 09/26/1966    Last attempt to quit: 06/26/1989    Years since quitting: 28.2  . Smokeless tobacco: Never Used  . Tobacco comment: quit 38 years ago  Substance and Sexual Activity  . Alcohol use: No    Alcohol/week: 0.0 oz  . Drug use: No  . Sexual activity: None  Other Topics Concern  . None  Social History Narrative  . None    Family History  Problem Relation Age of Onset  . Cancer Father        pacemaker  . Bradycardia Father     Review of Systems: As noted in HPI.   All other systems were reviewed and are negative.  Physical Exam: BP 122/85 (BP Location: Right Wrist, Cuff Size: Normal)   Pulse 66   Ht 5' 11"  (1.803 m)   Wt (!) 461 lb 9.6 oz (209.4 kg)   BMI 64.38 kg/m  Filed Weights   09/20/17 1424  Weight: (!) 461 lb 9.6 oz (209.4 kg)  GENERAL:  Well appearing, morbidly obese WM in NAD HEENT:  PERRL, EOMI, sclera are clear. Oropharynx is clear. NECK:  No jugular venous distention, carotid upstroke brisk and symmetric, no bruits, no thyromegaly or adenopathy LUNGS:  Clear to auscultation bilaterally CHEST:  Unremarkable HEART:  IRRR,  PMI not displaced or sustained,S1 and S2 within normal limits, no S3, no S4: no clicks, no rubs, no murmurs ABD:  Soft, nontender. Morbidly  obese. BS +, no masses or bruits. No hepatomegaly, no splenomegaly EXT:  Unable to feel pedal pulses, marked LE  edema with leg wraps in place. no cyanosis no clubbing SKIN:  Warm and dry.  No rashes NEURO:  Alert and oriented x 3. Cranial nerves II through XII intact. PSYCH:  Cognitively intact      LABORATORY DATA:  Lab Results  Component Value Date   WBC 6.6 07/29/2017   HGB 14.4 07/29/2017   HCT 45.3 07/29/2017   PLT 105 (L) 07/29/2017   GLUCOSE 133 (H) 07/29/2017   ALT 21 07/29/2017   AST 16 07/29/2017   NA 140 07/29/2017   K 4.3 07/29/2017   CL 102 07/29/2017   CREATININE 1.04 07/29/2017   BUN 15 07/29/2017   CO2 30 (H) 07/29/2017   INR 1.15 06/08/2010    Ecg: atrial fibrillation with rate 59 bpm. Nonspecific ST abnormality. I have personally reviewed and interpreted this study.   Echo 07/09/17: Study Conclusions  - Left ventricle: The cavity size was normal. Systolic function was normal. The estimated ejection fraction was in the range of 55% to 60%. Wall motion was normal; there were no regional wall motion abnormalities. - Left atrium: The atrium was mildly to moderately dilated. - Pulmonary arteries: PA peak pressure: 41 mm Hg (S).  Impressions:  - The right ventricular systolic pressure was increased consistent with moderate pulmonary hypertension.  Assessment / Plan: 1. Atrial fibrillation- persistent and now permanent. Patient is asymptomatic. HR is controlled on [place]. His CHAD-Vasc score is 0 so I would not recommend long term anticoagulation at this point. Continue rate control strategy.  2. Morbid obesity. High risk for OSA. Epworth sleepiness score of 15. Will pursue overnight oximetry to screen. If he has significant hypoxemia as expected he will need formal sleep study with CPAP titration.  3. Pulmonary HTN secondary to morbid obesity and probable OSA with respiratory failure. Will update Echo.   4. History of Nonhodgkin's lymphoma- no  recurrence.  5. Cirrhosis- nonalcoholic   '

## 2017-09-29 ENCOUNTER — Other Ambulatory Visit: Payer: Self-pay

## 2017-09-29 ENCOUNTER — Encounter: Payer: Self-pay | Admitting: Cardiology

## 2017-09-29 ENCOUNTER — Ambulatory Visit (HOSPITAL_COMMUNITY): Payer: Medicare Other | Attending: Cardiology

## 2017-09-29 DIAGNOSIS — E785 Hyperlipidemia, unspecified: Secondary | ICD-10-CM | POA: Insufficient documentation

## 2017-09-29 DIAGNOSIS — I361 Nonrheumatic tricuspid (valve) insufficiency: Secondary | ICD-10-CM | POA: Diagnosis not present

## 2017-09-29 DIAGNOSIS — I481 Persistent atrial fibrillation: Secondary | ICD-10-CM

## 2017-09-29 DIAGNOSIS — E669 Obesity, unspecified: Secondary | ICD-10-CM | POA: Insufficient documentation

## 2017-09-29 DIAGNOSIS — I4819 Other persistent atrial fibrillation: Secondary | ICD-10-CM

## 2017-09-30 ENCOUNTER — Other Ambulatory Visit: Payer: Self-pay

## 2017-09-30 DIAGNOSIS — I481 Persistent atrial fibrillation: Secondary | ICD-10-CM | POA: Diagnosis not present

## 2017-09-30 DIAGNOSIS — I50810 Right heart failure, unspecified: Secondary | ICD-10-CM

## 2017-09-30 DIAGNOSIS — G4734 Idiopathic sleep related nonobstructive alveolar hypoventilation: Secondary | ICD-10-CM

## 2017-09-30 DIAGNOSIS — I4819 Other persistent atrial fibrillation: Secondary | ICD-10-CM

## 2017-09-30 NOTE — Progress Notes (Signed)
sleep

## 2017-10-04 ENCOUNTER — Ambulatory Visit: Payer: Medicare Other | Admitting: Cardiology

## 2017-10-10 ENCOUNTER — Other Ambulatory Visit: Payer: Self-pay | Admitting: *Deleted

## 2017-10-10 DIAGNOSIS — Z8572 Personal history of non-Hodgkin lymphomas: Secondary | ICD-10-CM

## 2017-10-10 MED ORDER — OXYCODONE HCL 20 MG PO TABS: 20.0000 mg | ORAL_TABLET | Freq: Three times a day (TID) | ORAL | 0 refills | Status: DC | PRN

## 2017-10-10 MED ORDER — OXYCODONE HCL ER 40 MG PO T12A: 40.0000 mg | EXTENDED_RELEASE_TABLET | Freq: Two times a day (BID) | ORAL | 0 refills | Status: DC

## 2017-10-11 ENCOUNTER — Telehealth: Payer: Self-pay

## 2017-10-11 NOTE — Telephone Encounter (Signed)
Called patient 09/27/17.Dr.Jordan reviewed over night oximetry.He advised needs sleep study.Scheduler will call back after approved by insurance to schedule. Sleep study scheduled 11/13/17 at [facility].

## 2017-10-12 DIAGNOSIS — M1711 Unilateral primary osteoarthritis, right knee: Secondary | ICD-10-CM | POA: Diagnosis not present

## 2017-10-19 DIAGNOSIS — M1711 Unilateral primary osteoarthritis, right knee: Secondary | ICD-10-CM | POA: Diagnosis not present

## 2017-10-19 DIAGNOSIS — M170 Bilateral primary osteoarthritis of knee: Secondary | ICD-10-CM | POA: Diagnosis not present

## 2017-10-19 DIAGNOSIS — M1712 Unilateral primary osteoarthritis, left knee: Secondary | ICD-10-CM | POA: Diagnosis not present

## 2017-10-26 ENCOUNTER — Telehealth: Payer: Self-pay | Admitting: Cardiology

## 2017-10-26 DIAGNOSIS — M1711 Unilateral primary osteoarthritis, right knee: Secondary | ICD-10-CM | POA: Diagnosis not present

## 2017-10-26 NOTE — Telephone Encounter (Signed)
New message      Needs order and last office visit notes before the  Dec 15th for the patient to receive oxygen

## 2017-10-26 NOTE — Telephone Encounter (Signed)
Returned call to [employer] he just wanted to verify that Dr. Martinique did not want to start home O2.  If so then they need order and FTF before 12/15.    Advised patient is being sent for sleep study.  Caryl Pina states that once patient has study, if he doesn't meet criteria for therapy but still needs home o2 at night then the sleep study and note saying this would be sufficient for insurance.  Aware and verbalized understanding.

## 2017-11-07 ENCOUNTER — Encounter: Payer: Self-pay | Admitting: *Deleted

## 2017-11-07 ENCOUNTER — Other Ambulatory Visit: Payer: Self-pay | Admitting: Hematology & Oncology

## 2017-11-07 ENCOUNTER — Telehealth: Payer: Self-pay | Admitting: *Deleted

## 2017-11-07 DIAGNOSIS — Z8572 Personal history of non-Hodgkin lymphomas: Secondary | ICD-10-CM

## 2017-11-07 MED ORDER — OXYCODONE HCL ER 40 MG PO T12A: 40.0000 mg | EXTENDED_RELEASE_TABLET | Freq: Two times a day (BID) | ORAL | 0 refills | Status: DC

## 2017-11-07 MED ORDER — OXYCODONE HCL 20 MG PO TABS: 20.0000 mg | ORAL_TABLET | Freq: Three times a day (TID) | ORAL | 0 refills | Status: DC | PRN

## 2017-11-07 NOTE — Telephone Encounter (Signed)
Patient calling to request refill on oxycodone 20 mg and oxycontin 40 mg. Dr Marin Olp sent via e-script.

## 2017-11-09 ENCOUNTER — Ambulatory Visit (INDEPENDENT_AMBULATORY_CARE_PROVIDER_SITE_OTHER): Payer: Medicare Other | Admitting: Podiatry

## 2017-11-09 ENCOUNTER — Encounter: Payer: Self-pay | Admitting: Podiatry

## 2017-11-09 DIAGNOSIS — B351 Tinea unguium: Secondary | ICD-10-CM

## 2017-11-09 NOTE — Progress Notes (Signed)
Patient ID: Albert James, male   DOB: August 24, 1956, 61 y.o.   MRN: [id number] Complaint:  Visit Type: Patient returns to my office for continued preventative foot care services. Complaint: Patient states" my nails have grown long and thick and become painful to walk and wear shoes" Patient is obese and unable to get to his feet. The patient presents for preventative foot care services. No changes to ROS  Podiatric Exam: Vascular: dorsalis pedis and posterior tibial pulses are not  palpable bilateral. Capillary return is immediate. Temperature gradient is WNL. Skin turgor WNL  Sensorium: Normal Semmes Weinstein monofilament test. Normal tactile sensation bilaterally. Nail Exam: Pt has thick disfigured discolored nails with subungual debris noted bilateral entire nail hallux through fifth toenails Ulcer Exam: There is no evidence of ulcer or pre-ulcerative changes or infection. Orthopedic Exam: Muscle tone and strength are WNL. No limitations in general ROM. No crepitus or effusions noted. Foot type and digits show no abnormalities. Bony prominences are unremarkable. Severe swelling in legs and feet. Skin: No Porokeratosis. No infection or ulcers  Diagnosis:  Onychomycosis, , Pain in right toe, pain in left toes  Treatment & Plan Procedures and Treatment: Consent by patient was obtained for treatment procedures. The patient understood the discussion of treatment and procedures well. All questions were answered thoroughly reviewed. Debridement of mycotic and hypertrophic toenails, 1 through 5 bilateral and clearing of subungual debris. No ulceration, no infection noted.  Return Visit-Office Procedure: Patient instructed to return to the office for a follow up visit 10 weeks  for continued evaluation and treatment.    Gardiner Barefoot DPM

## 2017-11-13 ENCOUNTER — Ambulatory Visit (HOSPITAL_BASED_OUTPATIENT_CLINIC_OR_DEPARTMENT_OTHER): Payer: Medicare Other | Attending: Cardiology | Admitting: Cardiovascular Disease

## 2017-11-13 VITALS — Ht 72.0 in | Wt >= 6400 oz

## 2017-11-13 DIAGNOSIS — G4733 Obstructive sleep apnea (adult) (pediatric): Secondary | ICD-10-CM | POA: Diagnosis not present

## 2017-11-13 DIAGNOSIS — Z6841 Body Mass Index (BMI) 40.0 and over, adult: Secondary | ICD-10-CM | POA: Insufficient documentation

## 2017-11-13 DIAGNOSIS — I481 Persistent atrial fibrillation: Secondary | ICD-10-CM | POA: Diagnosis present

## 2017-11-13 DIAGNOSIS — I50810 Right heart failure, unspecified: Secondary | ICD-10-CM

## 2017-11-13 DIAGNOSIS — Z79899 Other long term (current) drug therapy: Secondary | ICD-10-CM | POA: Diagnosis not present

## 2017-11-13 DIAGNOSIS — Z7982 Long term (current) use of aspirin: Secondary | ICD-10-CM | POA: Insufficient documentation

## 2017-11-13 DIAGNOSIS — G4736 Sleep related hypoventilation in conditions classified elsewhere: Secondary | ICD-10-CM | POA: Insufficient documentation

## 2017-11-13 DIAGNOSIS — G4734 Idiopathic sleep related nonobstructive alveolar hypoventilation: Secondary | ICD-10-CM

## 2017-11-13 DIAGNOSIS — I4819 Other persistent atrial fibrillation: Secondary | ICD-10-CM

## 2017-11-14 ENCOUNTER — Other Ambulatory Visit: Payer: Self-pay | Admitting: *Deleted

## 2017-11-14 DIAGNOSIS — Z8572 Personal history of non-Hodgkin lymphomas: Secondary | ICD-10-CM

## 2017-11-14 MED ORDER — OXYCODONE HCL 20 MG PO TABS: 20.0000 mg | ORAL_TABLET | Freq: Three times a day (TID) | ORAL | 0 refills | Status: DC | PRN

## 2017-11-14 MED ORDER — OXYCODONE HCL ER 40 MG PO T12A: 40.0000 mg | EXTENDED_RELEASE_TABLET | Freq: Two times a day (BID) | ORAL | 0 refills | Status: DC

## 2017-11-16 NOTE — Telephone Encounter (Signed)
No note

## 2017-11-17 ENCOUNTER — Ambulatory Visit (HOSPITAL_COMMUNITY)
Admission: RE | Admit: 2017-11-17 | Discharge: 2017-11-17 | Disposition: A | Discharge status: Home / Self Care | Payer: Medicare Other | Source: Ambulatory Visit | Attending: Cardiology | Admitting: Cardiology

## 2017-11-17 ENCOUNTER — Encounter (HOSPITAL_COMMUNITY): Payer: Self-pay

## 2017-11-17 ENCOUNTER — Encounter (HOSPITAL_COMMUNITY): Payer: Self-pay | Admitting: Cardiology

## 2017-11-17 ENCOUNTER — Other Ambulatory Visit (HOSPITAL_COMMUNITY): Payer: Self-pay

## 2017-11-17 VITALS — BP 138/98 | HR 63 | Ht 72.0 in | Wt >= 6400 oz

## 2017-11-17 DIAGNOSIS — I5032 Chronic diastolic (congestive) heart failure: Secondary | ICD-10-CM | POA: Insufficient documentation

## 2017-11-17 DIAGNOSIS — F329 Major depressive disorder, single episode, unspecified: Secondary | ICD-10-CM | POA: Insufficient documentation

## 2017-11-17 DIAGNOSIS — E669 Obesity, unspecified: Secondary | ICD-10-CM | POA: Insufficient documentation

## 2017-11-17 DIAGNOSIS — Z79899 Other long term (current) drug therapy: Secondary | ICD-10-CM | POA: Insufficient documentation

## 2017-11-17 DIAGNOSIS — K7460 Unspecified cirrhosis of liver: Secondary | ICD-10-CM | POA: Diagnosis not present

## 2017-11-17 DIAGNOSIS — I272 Pulmonary hypertension, unspecified: Secondary | ICD-10-CM | POA: Diagnosis not present

## 2017-11-17 DIAGNOSIS — R0602 Shortness of breath: Secondary | ICD-10-CM

## 2017-11-17 DIAGNOSIS — I481 Persistent atrial fibrillation: Secondary | ICD-10-CM | POA: Diagnosis not present

## 2017-11-17 DIAGNOSIS — E785 Hyperlipidemia, unspecified: Secondary | ICD-10-CM | POA: Insufficient documentation

## 2017-11-17 DIAGNOSIS — R0902 Hypoxemia: Secondary | ICD-10-CM | POA: Diagnosis not present

## 2017-11-17 DIAGNOSIS — I482 Chronic atrial fibrillation: Secondary | ICD-10-CM | POA: Diagnosis not present

## 2017-11-17 DIAGNOSIS — C8280 Other types of follicular lymphoma, unspecified site: Secondary | ICD-10-CM

## 2017-11-17 DIAGNOSIS — Z6841 Body Mass Index (BMI) 40.0 and over, adult: Secondary | ICD-10-CM | POA: Insufficient documentation

## 2017-11-17 DIAGNOSIS — I872 Venous insufficiency (chronic) (peripheral): Secondary | ICD-10-CM | POA: Diagnosis not present

## 2017-11-17 DIAGNOSIS — Z87891 Personal history of nicotine dependence: Secondary | ICD-10-CM | POA: Diagnosis not present

## 2017-11-17 DIAGNOSIS — I2782 Chronic pulmonary embolism: Secondary | ICD-10-CM

## 2017-11-17 DIAGNOSIS — I4819 Other persistent atrial fibrillation: Secondary | ICD-10-CM

## 2017-11-17 LAB — CBC
HEMATOCRIT: 45.6 % (ref 39.0–52.0)
Hemoglobin: 14.4 g/dL (ref 13.0–17.0)
MCH: 28.7 pg (ref 26.0–34.0)
MCHC: 31.6 g/dL (ref 30.0–36.0)
MCV: 90.8 fL (ref 78.0–100.0)
Platelets: 105 10*3/uL — ABNORMAL LOW (ref 150–400)
RBC: 5.02 MIL/uL (ref 4.22–5.81)
RDW: 18.9 % — AB (ref 11.5–15.5)
WBC: 5.8 10*3/uL (ref 4.0–10.5)

## 2017-11-17 LAB — PROTIME-INR
INR: 1.28
PROTHROMBIN TIME: 15.8 s — AB (ref 11.4–15.2)

## 2017-11-17 LAB — BASIC METABOLIC PANEL
Anion gap: 8 (ref 5–15)
BUN: 16 mg/dL (ref 6–20)
CHLORIDE: 102 mmol/L (ref 101–111)
CO2: 30 mmol/L (ref 22–32)
Calcium: 9.4 mg/dL (ref 8.9–10.3)
Creatinine, Ser: 1.1 mg/dL (ref 0.61–1.24)
Glucose, Bld: 104 mg/dL — ABNORMAL HIGH (ref 65–99)
Potassium: 4.1 mmol/L (ref 3.5–5.1)
SODIUM: 140 mmol/L (ref 135–145)

## 2017-11-17 LAB — BRAIN NATRIURETIC PEPTIDE: B NATRIURETIC PEPTIDE 5: 339.1 pg/mL — AB (ref 0.0–100.0)

## 2017-11-17 MED ORDER — FUROSEMIDE 40 MG PO TABS: 40.0000 mg | ORAL_TABLET | Freq: Two times a day (BID) | ORAL | 3 refills | Status: DC

## 2017-11-17 NOTE — Progress Notes (Signed)
SATURATION QUALIFICATIONS: (This note is used to comply with regulatory documentation for home oxygen)  Patient Saturations on Room Air at Rest = 84%  Patient Saturations on Room Air while Ambulating = 80%  Patient Saturations on 2 Liters of oxygen while Ambulating = 90%  Please briefly explain why patient needs home oxygen: Pt at rest SATS low due to ongoing medical issues

## 2017-11-17 NOTE — Patient Instructions (Signed)
Increase Furosemide 40 mg (1 tab), twice a day  Your physician has requested that you have a lower or upper extremity venous duplex. This test is an ultrasound of the veins in the legs or arms. It looks at venous blood flow that carries blood from the heart to the legs or arms. Allow one hour for a Lower Venous exam. Allow thirty minutes for an Upper Venous exam. There are no restrictions or special instructions.   Your physician has recommended that you have a pulmonary function test. Pulmonary Function Tests are a group of tests that measure how well air moves in and out of your lungs.  Your physician has requested that you have a cardiac catheterization. Cardiac catheterization is used to diagnose and/or treat various heart conditions. Doctors may recommend this procedure for a number of different reasons. The most common reason is to evaluate chest pain. Chest pain can be a symptom of coronary artery disease (CAD), and cardiac catheterization can show whether plaque is narrowing or blocking your heart's arteries. This procedure is also used to evaluate the valves, as well as measure the blood flow and oxygen levels in different parts of your heart. For further information please visit [email or web address][facility][email or web address]. Please follow instruction sheet, as given.  You have been referred to Oxygen per (they will call you)   Labs drawn today (if we do not call you, then your lab work was stable)   Your physician recommends that you return for lab work in: 10 days  Your physician recommends that you schedule a follow-up appointment in: 2 weeks after Cath with Dr. Aundra Dubin

## 2017-11-18 NOTE — Progress Notes (Signed)
PCP: Dr. Lynnda Child Cardiology: Dr. Martinique HF Cardiology: Dr. Aundra Dubin  62 yo with permanent atrial fibrillation, NASH with cirrhosis, suspected OHS/OSA, and chronic diastolic CHF/RV failure was referred by Dr. Martinique for evaluation of CHF.   Patient has had atrial fibrillation since at least 2015, he is not anticoagulated. He has had problems with peripheral edema x years. He has also had some degree of dyspnea for a long time.  However, over the last few weeks dyspnea has become markedly worse.  He is short of breath walking more than just around his house (short distances).  Short of breath dressing and bending over.  +Othopnea.  No chest pain.  Uses walker.  He had a recent sleep study but no results available yet.  Oxygen saturation 81% at rest today.   Echo was done in 11/18 showing EF 50-55%, septal flattening, severe RV dilation, PASP 77 mmHg.   ECG (personally reviewed, 10/18): atrial fibrillation at 57  Labs (10/18): LDL 101, TSH normal, K 3.9, creatinine 1.2.   PMH: 1. Non-hodgkins lymphoma: follicular small cell lymphoma.  S/p treatment, in clinical remission.  2. Cirrhosis: Suspect NASH.  - Abdominal US (10/18): Suggestive of cirrhosis.  3. Hyperlipidemia.  4. Obesity 5. Atrial fibrillation: Suspected permanent. He appears to have been in atrial fibrillation at least back to 2015.  6. Depression 7. Venous insufficiency 8. Suspect OHS/OSA 9. H/o vertebral osteomyelitis.  10. OA knees 11. Chronic diastolic CHF/RV failure: Echo (11/18) with EF 50-55%, septal flattening, RV severely dilated with normal systolic function, PASP 77 mmHg, moderate TR.   Social History   Socioeconomic History  . Marital status: Divorced    Spouse name: Not on file  . Number of children: 2  . Years of education: Not on file  . Highest education level: Not on file  Social Needs  . Financial resource strain: Not on file  . Food insecurity - worry: Not on file  . Food insecurity - inability: Not on  file  . Transportation needs - medical: Not on file  . Transportation needs - non-medical: Not on file  Occupational History  . Occupation: disabled  Tobacco Use  . Smoking status: Former Smoker    Packs/day: 0.50    Years: 8.00    Pack years: 4.00    Types: Cigarettes    Start date: 09/26/1966    Last attempt to quit: 06/26/1989    Years since quitting: 28.4  . Smokeless tobacco: Never Used  . Tobacco comment: quit 38 years ago  Substance and Sexual Activity  . Alcohol use: No    Alcohol/week: 0.0 oz  . Drug use: No  . Sexual activity: Not on file  Other Topics Concern  . Not on file  Social History Narrative  . Not on file   Family History  Problem Relation Age of Onset  . Cancer Father        pacemaker  . Bradycardia Father    ROS: All systems reviewed and negative except as per HPI.   Current Outpatient Medications  Medication Sig Dispense Refill  . B Complex-C (B-COMPLEX WITH VITAMIN C) tablet Take 1 tablet by mouth every morning.     . Diclofenac Sodium 1.5 % SOLN   2  . docusate sodium (COLACE) 100 MG capsule Take 1 capsule (100 mg total) by mouth 2 (two) times daily as needed for mild constipation. 20 capsule 1  . etodolac (LODINE) 400 MG tablet Take 400 mg by mouth 2 (two) times daily.    Marland Kitchen  fluticasone (FLONASE) 50 MCG/ACT nasal spray Place 1 spray into the nose as needed for allergies.     . furosemide (LASIX) 40 MG tablet Take 1 tablet (40 mg total) by mouth 2 (two) times daily. 60 tablet 3  . lactulose (CHRONULAC) 10 GM/15ML solution Take 10 g by mouth as needed for mild constipation.     Marland Kitchen levothyroxine (SYNTHROID, LEVOTHROID) 125 MCG tablet   0  . nadolol (CORGARD) 20 MG tablet Take 20 mg by mouth every morning.     Marland Kitchen omega-3 acid ethyl esters (LOVAZA) 1 g capsule   0  . oxyCODONE (OXYCONTIN) 40 mg 12 hr tablet Take 1 tablet (40 mg total) by mouth every 12 (twelve) hours. Do not fill until 07/25/2017 60 tablet 0  . Oxycodone HCl 20 MG TABS Take 1 tablet (20  mg total) by mouth every 8 (eight) hours as needed. 90 tablet 0  . spironolactone (ALDACTONE) 100 MG tablet Take 100 mg by mouth 2 (two) times daily.      . VOLTAREN 1 % GEL APPLY 1 GEL AS DIRECTED 4 ITMES A DAY  2  . glucosamine-chondroitin 500-400 MG tablet Take 2 tablets by mouth 2 (two) times daily.    . Omega-3 Fatty Acids (FISH OIL) 1000 MG CAPS Take 1 capsule by mouth 2 (two) times daily.      No current facility-administered medications for this encounter.    BP (!) 138/98 (BP Location: Left Arm, Patient Position: Sitting, Cuff Size: Large)   Pulse 63   Ht 6' (1.829 m)   Wt (!) 456 lb 6.4 oz (207 kg)   SpO2 (!) 83% Comment: recovered to 93 on 4L:  BMI 61.90 kg/m  General: NAD, obese Neck: Thick, JVP 12 cm, no thyromegaly or thyroid nodule.  Lungs: Distant BS bilaterally.  CV: Nonpalpable PMI.  Heart irregular S1/S2, no S3/S4, no murmur.  Legs wrapped, chronic edema bilateral legs.  No carotid bruit.  Unable to palpate pedal pulses due to edema.   Abdomen: Soft, nontender, no hepatosplenomegaly, no distention.  Skin: Intact without lesions or rashes.  Neurologic: Alert and oriented x 3.  Psych: Normal affect. Extremities: No clubbing or cyanosis.  HEENT: Normal.   1. Chronic diastolic CHF with prominent RV failure: Echo most remarkable for pulmonary hypertension and RV failure (11/18). I suspect RV failure is triggered by pulmonary hypertension.  ?Due to OSA/OHS.  NYHA class IIIb symptoms, steadily worsening.  He is volume overloaded on exam.   - Increase Lasix to 40 mg bid. BMET today and in 10 days.  2. Pulmonary hypertension: Severe by echo.  Unsure if this is pulmonary venous hypertension or pulmonary arterial hypertension.  PAH could be due to OHS/OSA or would consider portopulmonary hypertension as well.  - He needs supplemental oxygen at all times based on oxygen saturations today. I will make referral to home care.  - He needs PFTs, will arrange.  - I will get a V/Q scan  to look for chronic PE.   - Ultimately, to sort out his hemodynamics, we will need a right heart cath.  Given worsening dyspnea, I will do a left heart cath at the same time. I discussed risks/benefits of procedure and he agrees.   3. OHS/OSA: Suspected.  I will arrange for home oxygen.  Awaiting sleep study results, will likely need CPAP.  4. Cirrhosis: Suspected NASH.  Denies ETOH. Cannot rule out portopulmonary hypertension. He is on spironolactone.  5. Atrial fibrillation: Chronic, suspect permanent (present  since at least 2015).  Rate is reasonably controlled.  Given his quite significant CHF, I would favor anticoagulation.  I will plan on starting Eliquis after cath.  It is unlikely that he would convert to NSR after years of atrial fibrillation.   Followup 2 wks post-cath.  [name] 11/18/2017

## 2017-11-18 NOTE — H&P (View-Only) (Signed)
PCP: Dr. Lynnda Child Cardiology: Dr. Martinique HF Cardiology: Dr. Aundra Dubin  62 yo with permanent atrial fibrillation, NASH with cirrhosis, suspected OHS/OSA, and chronic diastolic CHF/RV failure was referred by Dr. Martinique for evaluation of CHF.   Patient has had atrial fibrillation since at least 2015, he is not anticoagulated. He has had problems with peripheral edema x years. He has also had some degree of dyspnea for a long time.  However, over the last few weeks dyspnea has become markedly worse.  He is short of breath walking more than just around his house (short distances).  Short of breath dressing and bending over.  +Othopnea.  No chest pain.  Uses walker.  He had a recent sleep study but no results available yet.  Oxygen saturation 81% at rest today.   Echo was done in 11/18 showing EF 50-55%, septal flattening, severe RV dilation, PASP 77 mmHg.   ECG (personally reviewed, 10/18): atrial fibrillation at 57  Labs (10/18): LDL 101, TSH normal, K 3.9, creatinine 1.2.   PMH: 1. Non-hodgkins lymphoma: follicular small cell lymphoma.  S/p treatment, in clinical remission.  2. Cirrhosis: Suspect NASH.  - Abdominal US (10/18): Suggestive of cirrhosis.  3. Hyperlipidemia.  4. Obesity 5. Atrial fibrillation: Suspected permanent. He appears to have been in atrial fibrillation at least back to 2015.  6. Depression 7. Venous insufficiency 8. Suspect OHS/OSA 9. H/o vertebral osteomyelitis.  10. OA knees 11. Chronic diastolic CHF/RV failure: Echo (11/18) with EF 50-55%, septal flattening, RV severely dilated with normal systolic function, PASP 77 mmHg, moderate TR.   Social History   Socioeconomic History  . Marital status: Divorced    Spouse name: Not on file  . Number of children: 2  . Years of education: Not on file  . Highest education level: Not on file  Social Needs  . Financial resource strain: Not on file  . Food insecurity - worry: Not on file  . Food insecurity - inability: Not on  file  . Transportation needs - medical: Not on file  . Transportation needs - non-medical: Not on file  Occupational History  . Occupation: disabled  Tobacco Use  . Smoking status: Former Smoker    Packs/day: 0.50    Years: 8.00    Pack years: 4.00    Types: Cigarettes    Start date: 09/26/1966    Last attempt to quit: 06/26/1989    Years since quitting: 28.4  . Smokeless tobacco: Never Used  . Tobacco comment: quit 38 years ago  Substance and Sexual Activity  . Alcohol use: No    Alcohol/week: 0.0 oz  . Drug use: No  . Sexual activity: Not on file  Other Topics Concern  . Not on file  Social History Narrative  . Not on file   Family History  Problem Relation Age of Onset  . Cancer Father        pacemaker  . Bradycardia Father    ROS: All systems reviewed and negative except as per HPI.   Current Outpatient Medications  Medication Sig Dispense Refill  . B Complex-C (B-COMPLEX WITH VITAMIN C) tablet Take 1 tablet by mouth every morning.     . Diclofenac Sodium 1.5 % SOLN   2  . docusate sodium (COLACE) 100 MG capsule Take 1 capsule (100 mg total) by mouth 2 (two) times daily as needed for mild constipation. 20 capsule 1  . etodolac (LODINE) 400 MG tablet Take 400 mg by mouth 2 (two) times daily.    Marland Kitchen  fluticasone (FLONASE) 50 MCG/ACT nasal spray Place 1 spray into the nose as needed for allergies.     . furosemide (LASIX) 40 MG tablet Take 1 tablet (40 mg total) by mouth 2 (two) times daily. 60 tablet 3  . lactulose (CHRONULAC) 10 GM/15ML solution Take 10 g by mouth as needed for mild constipation.     Marland Kitchen levothyroxine (SYNTHROID, LEVOTHROID) 125 MCG tablet   0  . nadolol (CORGARD) 20 MG tablet Take 20 mg by mouth every morning.     Marland Kitchen omega-3 acid ethyl esters (LOVAZA) 1 g capsule   0  . oxyCODONE (OXYCONTIN) 40 mg 12 hr tablet Take 1 tablet (40 mg total) by mouth every 12 (twelve) hours. Do not fill until 07/25/2017 60 tablet 0  . Oxycodone HCl 20 MG TABS Take 1 tablet (20  mg total) by mouth every 8 (eight) hours as needed. 90 tablet 0  . spironolactone (ALDACTONE) 100 MG tablet Take 100 mg by mouth 2 (two) times daily.      . VOLTAREN 1 % GEL APPLY 1 GEL AS DIRECTED 4 ITMES A DAY  2  . glucosamine-chondroitin 500-400 MG tablet Take 2 tablets by mouth 2 (two) times daily.    . Omega-3 Fatty Acids (FISH OIL) 1000 MG CAPS Take 1 capsule by mouth 2 (two) times daily.      No current facility-administered medications for this encounter.    BP (!) 138/98 (BP Location: Left Arm, Patient Position: Sitting, Cuff Size: Large)   Pulse 63   Ht 6' (1.829 m)   Wt (!) 456 lb 6.4 oz (207 kg)   SpO2 (!) 83% Comment: recovered to 93 on 4L:  BMI 61.90 kg/m  General: NAD, obese Neck: Thick, JVP 12 cm, no thyromegaly or thyroid nodule.  Lungs: Distant BS bilaterally.  CV: Nonpalpable PMI.  Heart irregular S1/S2, no S3/S4, no murmur.  Legs wrapped, chronic edema bilateral legs.  No carotid bruit.  Unable to palpate pedal pulses due to edema.   Abdomen: Soft, nontender, no hepatosplenomegaly, no distention.  Skin: Intact without lesions or rashes.  Neurologic: Alert and oriented x 3.  Psych: Normal affect. Extremities: No clubbing or cyanosis.  HEENT: Normal.   1. Chronic diastolic CHF with prominent RV failure: Echo most remarkable for pulmonary hypertension and RV failure (11/18). I suspect RV failure is triggered by pulmonary hypertension.  ?Due to OSA/OHS.  NYHA class IIIb symptoms, steadily worsening.  He is volume overloaded on exam.   - Increase Lasix to 40 mg bid. BMET today and in 10 days.  2. Pulmonary hypertension: Severe by echo.  Unsure if this is pulmonary venous hypertension or pulmonary arterial hypertension.  PAH could be due to OHS/OSA or would consider portopulmonary hypertension as well.  - He needs supplemental oxygen at all times based on oxygen saturations today. I will make referral to home care.  - He needs PFTs, will arrange.  - I will get a V/Q scan  to look for chronic PE.   - Ultimately, to sort out his hemodynamics, we will need a right heart cath.  Given worsening dyspnea, I will do a left heart cath at the same time. I discussed risks/benefits of procedure and he agrees.   3. OHS/OSA: Suspected.  I will arrange for home oxygen.  Awaiting sleep study results, will likely need CPAP.  4. Cirrhosis: Suspected NASH.  Denies ETOH. Cannot rule out portopulmonary hypertension. He is on spironolactone.  5. Atrial fibrillation: Chronic, suspect permanent (present  since at least 2015).  Rate is reasonably controlled.  Given his quite significant CHF, I would favor anticoagulation.  I will plan on starting Eliquis after cath.  It is unlikely that he would convert to NSR after years of atrial fibrillation.   Followup 2 wks post-cath.  [name] 11/18/2017

## 2017-11-21 ENCOUNTER — Encounter (HOSPITAL_COMMUNITY)
Admission: RE | Disposition: A | Discharge status: Home / Self Care | Payer: Self-pay | Source: Ambulatory Visit | Attending: Cardiology

## 2017-11-21 ENCOUNTER — Ambulatory Visit (HOSPITAL_COMMUNITY)
Admission: RE | Admit: 2017-11-21 | Discharge: 2017-11-21 | Disposition: A | Discharge status: Home / Self Care | Payer: Medicare Other | Source: Ambulatory Visit | Attending: Cardiology | Admitting: Cardiology

## 2017-11-21 DIAGNOSIS — K7460 Unspecified cirrhosis of liver: Secondary | ICD-10-CM | POA: Diagnosis not present

## 2017-11-21 DIAGNOSIS — R0602 Shortness of breath: Secondary | ICD-10-CM

## 2017-11-21 DIAGNOSIS — I5032 Chronic diastolic (congestive) heart failure: Secondary | ICD-10-CM | POA: Insufficient documentation

## 2017-11-21 DIAGNOSIS — E669 Obesity, unspecified: Secondary | ICD-10-CM | POA: Insufficient documentation

## 2017-11-21 DIAGNOSIS — I2721 Secondary pulmonary arterial hypertension: Secondary | ICD-10-CM | POA: Insufficient documentation

## 2017-11-21 DIAGNOSIS — Z87891 Personal history of nicotine dependence: Secondary | ICD-10-CM | POA: Insufficient documentation

## 2017-11-21 DIAGNOSIS — I509 Heart failure, unspecified: Secondary | ICD-10-CM | POA: Diagnosis not present

## 2017-11-21 DIAGNOSIS — K7581 Nonalcoholic steatohepatitis (NASH): Secondary | ICD-10-CM | POA: Insufficient documentation

## 2017-11-21 DIAGNOSIS — Z79890 Hormone replacement therapy (postmenopausal): Secondary | ICD-10-CM | POA: Insufficient documentation

## 2017-11-21 DIAGNOSIS — Z79899 Other long term (current) drug therapy: Secondary | ICD-10-CM | POA: Diagnosis not present

## 2017-11-21 DIAGNOSIS — Z8572 Personal history of non-Hodgkin lymphomas: Secondary | ICD-10-CM | POA: Diagnosis not present

## 2017-11-21 DIAGNOSIS — I482 Chronic atrial fibrillation: Secondary | ICD-10-CM | POA: Insufficient documentation

## 2017-11-21 DIAGNOSIS — E785 Hyperlipidemia, unspecified: Secondary | ICD-10-CM | POA: Diagnosis not present

## 2017-11-21 DIAGNOSIS — I272 Pulmonary hypertension, unspecified: Secondary | ICD-10-CM | POA: Diagnosis not present

## 2017-11-21 HISTORY — PX: RIGHT/LEFT HEART CATH AND CORONARY ANGIOGRAPHY: CATH118266

## 2017-11-21 LAB — POCT I-STAT 3, VENOUS BLOOD GAS (G3P V)
Acid-base deficit: 1 mmol/L (ref 0.0–2.0)
Acid-base deficit: 1 mmol/L (ref 0.0–2.0)
BICARBONATE: 26.1 mmol/L (ref 20.0–28.0)
BICARBONATE: 27.3 mmol/L (ref 20.0–28.0)
O2 SAT: 60 %
O2 Saturation: 57 %
PH VEN: 7.283 (ref 7.250–7.430)
PO2 VEN: 34 mmHg (ref 32.0–45.0)
TCO2: 29 mmol/L (ref 22–32)
TCO2: 28 mmol/L (ref 22–32)
pCO2, Ven: 55.2 mmHg (ref 44.0–60.0)
pCO2, Ven: 57.5 mmHg (ref 44.0–60.0)
pH, Ven: 7.284 (ref 7.250–7.430)
pO2, Ven: 36 mmHg (ref 32.0–45.0)

## 2017-11-21 LAB — POCT I-STAT 3, ART BLOOD GAS (G3+)
Acid-base deficit: 1 mmol/L (ref 0.0–2.0)
Bicarbonate: 27 mmol/L (ref 20.0–28.0)
O2 Saturation: 92 %
PCO2 ART: 57.2 mmHg — AB (ref 32.0–48.0)
PO2 ART: 74 mmHg — AB (ref 83.0–108.0)
TCO2: 29 mmol/L (ref 22–32)
pH, Arterial: 7.282 — ABNORMAL LOW (ref 7.350–7.450)

## 2017-11-21 SURGERY — RIGHT/LEFT HEART CATH AND CORONARY ANGIOGRAPHY
Anesthesia: LOCAL

## 2017-11-21 MED ORDER — IOPAMIDOL (ISOVUE-370) INJECTION 76%
INTRAVENOUS | Status: AC
  Filled 2017-11-21: qty 100

## 2017-11-21 MED ORDER — ACETAMINOPHEN 325 MG PO TABS: 650.0000 mg | ORAL_TABLET | ORAL | Status: DC | PRN

## 2017-11-21 MED ORDER — HEPARIN SODIUM (PORCINE) 1000 UNIT/ML IJ SOLN
INTRAMUSCULAR | Status: AC
  Filled 2017-11-21: qty 1

## 2017-11-21 MED ORDER — FENTANYL CITRATE (PF) 100 MCG/2ML IJ SOLN
INTRAMUSCULAR | Status: DC | PRN
  Administered 2017-11-21: 25 ug via INTRAVENOUS

## 2017-11-21 MED ORDER — FENTANYL CITRATE (PF) 100 MCG/2ML IJ SOLN
INTRAMUSCULAR | Status: AC
  Filled 2017-11-21: qty 2

## 2017-11-21 MED ORDER — ASPIRIN 81 MG PO CHEW: 81.0000 mg | CHEWABLE_TABLET | ORAL | Status: DC

## 2017-11-21 MED ORDER — LIDOCAINE HCL (PF) 1 % IJ SOLN
INTRAMUSCULAR | Status: AC
Start: 2017-11-21 — End: ?
  Filled 2017-11-21: qty 30

## 2017-11-21 MED ORDER — MIDAZOLAM HCL 2 MG/2ML IJ SOLN
INTRAMUSCULAR | Status: DC | PRN
  Administered 2017-11-21: 1 mg via INTRAVENOUS

## 2017-11-21 MED ORDER — SODIUM CHLORIDE 0.9% FLUSH: 3.0000 mL | Freq: Two times a day (BID) | INTRAVENOUS | Status: DC

## 2017-11-21 MED ORDER — LIDOCAINE HCL (PF) 1 % IJ SOLN
INTRAMUSCULAR | Status: DC | PRN
  Administered 2017-11-21: 5 mL

## 2017-11-21 MED ORDER — HEPARIN (PORCINE) IN NACL 2-0.9 UNIT/ML-% IJ SOLN
INTRAMUSCULAR | Status: DC | PRN
  Administered 2017-11-21: 10 mL via INTRA_ARTERIAL

## 2017-11-21 MED ORDER — VERAPAMIL HCL 2.5 MG/ML IV SOLN
INTRAVENOUS | Status: AC
  Filled 2017-11-21: qty 2

## 2017-11-21 MED ORDER — ONDANSETRON HCL 4 MG/2ML IJ SOLN: 4.0000 mg | Freq: Four times a day (QID) | INTRAMUSCULAR | Status: DC | PRN

## 2017-11-21 MED ORDER — MIDAZOLAM HCL 2 MG/2ML IJ SOLN
INTRAMUSCULAR | Status: AC
  Filled 2017-11-21: qty 2

## 2017-11-21 MED ORDER — HEPARIN (PORCINE) IN NACL 2-0.9 UNIT/ML-% IJ SOLN
INTRAMUSCULAR | Status: AC
  Filled 2017-11-21: qty 1500

## 2017-11-21 MED ORDER — HEPARIN (PORCINE) IN NACL 2-0.9 UNIT/ML-% IJ SOLN
INTRAMUSCULAR | Status: AC | PRN
  Administered 2017-11-21: 1000 mL

## 2017-11-21 MED ORDER — SODIUM CHLORIDE 0.9 % IV SOLN: 250.0000 mL | INTRAVENOUS | Status: DC | PRN

## 2017-11-21 MED ORDER — SODIUM CHLORIDE 0.9 % IV SOLN
INTRAVENOUS | Status: DC
  Administered 2017-11-21: 09:00:00 via INTRAVENOUS

## 2017-11-21 MED ORDER — SODIUM CHLORIDE 0.9% FLUSH: 3.0000 mL | INTRAVENOUS | Status: DC | PRN

## 2017-11-21 MED ORDER — HEPARIN SODIUM (PORCINE) 1000 UNIT/ML IJ SOLN
INTRAMUSCULAR | Status: DC | PRN
  Administered 2017-11-21: 7000 [IU] via INTRAVENOUS

## 2017-11-21 MED ORDER — IOPAMIDOL (ISOVUE-370) INJECTION 76%
INTRAVENOUS | Status: DC | PRN
  Administered 2017-11-21: 50 mL via INTRA_ARTERIAL

## 2017-11-21 SURGICAL SUPPLY — 12 items
BRACE RADIAL COMPRESSION RADST (HEMOSTASIS) ×1 IMPLANT
CATH 5FR JL3.5 JR4 ANG PIG MP (CATHETERS) ×1 IMPLANT
CATH BALLN WEDGE 5F 110CM (CATHETERS) ×1 IMPLANT
DEVICE RAD COMP TR BAND LRG (VASCULAR PRODUCTS) ×1 IMPLANT
GLIDESHEATH SLEND SS 6F .021 (SHEATH) ×1 IMPLANT
GUIDEWIRE INQWIRE 1.5J.035X260 (WIRE) IMPLANT
INQWIRE 1.5J .035X260CM (WIRE) ×2
KIT HEART LEFT (KITS) ×2 IMPLANT
PACK CARDIAC CATHETERIZATION (CUSTOM PROCEDURE TRAY) ×2 IMPLANT
SHEATH GLIDE SLENDER 4/5FR (SHEATH) ×1 IMPLANT
TRANSDUCER W/STOPCOCK (MISCELLANEOUS) ×2 IMPLANT
TUBING CIL FLEX 10 FLL-RA (TUBING) ×2 IMPLANT

## 2017-11-21 NOTE — Interval H&P Note (Signed)
History and Physical Interval Note:  11/21/2017 10:52 AM  Albert James  has presented today for surgery, with the diagnosis of SOB  The various methods of treatment have been discussed with the patient and family. After consideration of risks, benefits and other options for treatment, the patient has consented to  Procedure(s): RIGHT/LEFT HEART CATH AND CORONARY ANGIOGRAPHY (N/A) as a surgical intervention .  The patient's history has been reviewed, patient examined, no change in status, stable for surgery.  I have reviewed the patient's chart and labs.  Questions were answered to the patient's satisfaction.     Jilleen Essner [employer]

## 2017-11-21 NOTE — Discharge Instructions (Signed)

## 2017-11-22 ENCOUNTER — Encounter (HOSPITAL_COMMUNITY): Payer: Self-pay | Admitting: Cardiology

## 2017-11-22 ENCOUNTER — Other Ambulatory Visit (HOSPITAL_COMMUNITY): Payer: Medicare Other

## 2017-11-22 ENCOUNTER — Ambulatory Visit (HOSPITAL_COMMUNITY): Payer: Medicare Other

## 2017-11-22 ENCOUNTER — Encounter (HOSPITAL_COMMUNITY): Payer: Medicare Other

## 2017-11-23 ENCOUNTER — Telehealth (HOSPITAL_COMMUNITY): Payer: Self-pay | Admitting: *Deleted

## 2017-11-23 ENCOUNTER — Other Ambulatory Visit (HOSPITAL_COMMUNITY): Payer: Self-pay

## 2017-11-23 ENCOUNTER — Encounter (HOSPITAL_COMMUNITY): Payer: Self-pay

## 2017-11-23 NOTE — Telephone Encounter (Signed)
Katrina faxed portable O2 conc. Order to [facility] per patient and Dr.McLeans request.

## 2017-11-25 ENCOUNTER — Ambulatory Visit (HOSPITAL_COMMUNITY)
Admission: RE | Admit: 2017-11-25 | Discharge: 2017-11-25 | Disposition: A | Discharge status: Home / Self Care | Payer: Medicare Other | Source: Ambulatory Visit | Attending: Cardiology | Admitting: Cardiology

## 2017-11-25 ENCOUNTER — Encounter (HOSPITAL_COMMUNITY)
Admission: RE | Admit: 2017-11-25 | Discharge: 2017-11-25 | Disposition: A | Discharge status: Home / Self Care | Payer: Medicare Other | Source: Ambulatory Visit | Attending: Cardiology | Admitting: Cardiology

## 2017-11-25 DIAGNOSIS — Z87891 Personal history of nicotine dependence: Secondary | ICD-10-CM | POA: Diagnosis not present

## 2017-11-25 DIAGNOSIS — I2782 Chronic pulmonary embolism: Secondary | ICD-10-CM

## 2017-11-25 DIAGNOSIS — I878 Other specified disorders of veins: Secondary | ICD-10-CM | POA: Diagnosis not present

## 2017-11-25 DIAGNOSIS — I2699 Other pulmonary embolism without acute cor pulmonale: Secondary | ICD-10-CM | POA: Diagnosis not present

## 2017-11-25 DIAGNOSIS — R0602 Shortness of breath: Secondary | ICD-10-CM | POA: Diagnosis not present

## 2017-11-25 DIAGNOSIS — R0902 Hypoxemia: Secondary | ICD-10-CM | POA: Insufficient documentation

## 2017-11-25 DIAGNOSIS — R918 Other nonspecific abnormal finding of lung field: Secondary | ICD-10-CM | POA: Diagnosis not present

## 2017-11-25 DIAGNOSIS — J449 Chronic obstructive pulmonary disease, unspecified: Secondary | ICD-10-CM | POA: Insufficient documentation

## 2017-11-25 DIAGNOSIS — I517 Cardiomegaly: Secondary | ICD-10-CM | POA: Diagnosis not present

## 2017-11-25 LAB — PULMONARY FUNCTION TEST
DL/VA % pred: 81 %
DL/VA: 3.83 ml/min/mmHg/L
DLCO COR % PRED: 53 %
DLCO cor: 18.51 ml/min/mmHg
DLCO unc % pred: 53 %
DLCO unc: 18.4 ml/min/mmHg
FEF 25-75 POST: 1.63 L/s
FEF 25-75 Pre: 1.23 L/sec
FEF2575-%CHANGE-POST: 32 %
FEF2575-%PRED-POST: 55 %
FEF2575-%PRED-PRE: 41 %
FEV1-%Change-Post: 6 %
FEV1-%PRED-PRE: 51 %
FEV1-%Pred-Post: 54 %
FEV1-POST: 2.02 L
FEV1-Pre: 1.89 L
FEV1FVC-%CHANGE-POST: 10 %
FEV1FVC-%PRED-PRE: 95 %
FEV6-%Change-Post: -2 %
FEV6-%Pred-Post: 54 %
FEV6-%Pred-Pre: 55 %
FEV6-Post: 2.54 L
FEV6-Pre: 2.6 L
FEV6FVC-%Change-Post: 0 %
FEV6FVC-%Pred-Post: 104 %
FEV6FVC-%Pred-Pre: 103 %
FVC-%CHANGE-POST: -3 %
FVC-%PRED-POST: 52 %
FVC-%PRED-PRE: 53 %
FVC-POST: 2.56 L
FVC-PRE: 2.65 L
PRE FEV1/FVC RATIO: 71 %
Post FEV1/FVC ratio: 79 %
Post FEV6/FVC ratio: 99 %
Pre FEV6/FVC Ratio: 98 %

## 2017-11-25 MED ORDER — TECHNETIUM TC 99M DIETHYLENETRIAME-PENTAACETIC ACID
30.0000 | Freq: Once | INTRAVENOUS | Status: AC | PRN
  Administered 2017-11-25: 30 via INTRAVENOUS

## 2017-11-25 MED ORDER — ALBUTEROL SULFATE (2.5 MG/3ML) 0.083% IN NEBU
2.5000 mg | INHALATION_SOLUTION | Freq: Once | RESPIRATORY_TRACT | Status: AC
  Administered 2017-11-25: 2.5 mg via RESPIRATORY_TRACT

## 2017-11-25 MED ORDER — TECHNETIUM TO 99M ALBUMIN AGGREGATED
4.0000 | Freq: Once | INTRAVENOUS | Status: AC | PRN
  Administered 2017-11-25: 4 via INTRAVENOUS

## 2017-11-27 ENCOUNTER — Encounter (HOSPITAL_BASED_OUTPATIENT_CLINIC_OR_DEPARTMENT_OTHER): Payer: Self-pay | Admitting: Cardiovascular Disease

## 2017-11-27 NOTE — Procedures (Signed)
Patient Name: Albert James, Albert James Date: 11/13/2017 Gender: Male D.O.B: 01/12/1956 Age (years): 61 Referring Provider: Peter M Martinique Height (inches): 70 Interpreting Physician: Shelva Majestic MD, ABSM Weight (lbs): 450 RPSGT: Baxter Flattery BMI: 65 MRN: [id number] Neck Size: 21.00  CLINICAL INFORMATION Sleep Study Type: NPSG  Indication for sleep study: Fatigue, Morbid Obesity, Nocturnal Gasping, Obesity, Snoring, Witnesses Apnea / Gasping During Sleep  Epworth Sleepiness Score: 10  SLEEP STUDY TECHNIQUE As per the AASM Manual for the Scoring of Sleep and Associated Events v2.3 (April 2016) with a hypopnea requiring 4% desaturations.  The channels recorded and monitored were frontal, central and occipital EEG, electrooculogram (EOG), submentalis EMG (chin), nasal and oral airflow, thoracic and abdominal wall motion, anterior tibialis EMG, snore microphone, electrocardiogram, and pulse oximetry.  MEDICATIONS     aspirin 325 MG EC tablet         cycloSPORINE (RESTASIS) 0.05 % ophthalmic emulsion         diclofenac sodium (VOLTAREN) 1 % GEL         etodolac (LODINE) 400 MG tablet         fluticasone (FLONASE) 50 MCG/ACT nasal spray         furosemide (LASIX) 40 MG tablet         lactulose (CHRONULAC) 10 GM/15ML solution         levothyroxine (SYNTHROID, LEVOTHROID) 125 MCG tablet         nadolol (CORGARD) 20 MG tablet         NARCAN 4 MG/0.1ML LIQD nasal spray kit         omega-3 acid ethyl esters (LOVAZA) 1 g capsule         oxyCODONE (OXYCONTIN) 40 mg 12 hr tablet         Oxycodone HCl 20 MG TABS         spironolactone (ALDACTONE) 100 MG tablet      Medications self-administered by patient taken the night of the study : OXYCODONE HCL  SLEEP ARCHITECTURE The study was initiated at 9:58:18 PM and ended at 4:31:01 AM.  Sleep onset time was 0.3 minutes and the sleep efficiency was 67.2%. The total sleep time was 264.0 minutes. Wake after sleep onset (WAAO) was  128.5 minutes  Stage REM latency was N/A minutes.  The patient spent 22.16% of the night in stage N1 sleep, 77.84% in stage N2 sleep, 0.00% in stage N3 and 0.00% in REM.  Alpha intrusion was absent.  Supine sleep was 100.00%.  RESPIRATORY PARAMETERS The overall apnea/hypopnea index (AHI) was 87.1 per hour. There were 382 total apneas, including 358 obstructive, 19 central and 5 mixed apneas. There were 1 hypopneas and 3 RERAs.  The AHI during Stage REM sleep was N/A per hour.  AHI while supine was 87.1 per hour.  The mean oxygen saturation was 86.71%. The minimum SpO2 during sleep was 0.00%.  Moderate snoring.  CARDIAC DATA The 2 lead EKG demonstrated sinus rhythm. The mean heart rate was 47.49 beats per minute. Other EKG findings include: None.  LEG MOVEMENT DATA The total PLMS were 0 with a resulting PLMS index of 0.00. Associated arousal with leg movement index was 0.0 .  IMPRESSIONS - Severe obstructive sleep apnea  (AHI 87.1/h). - No significant central sleep apnea occurred during this study (CAI = 4.3/h). - Abnormal sleep architecture with absence of slow-wave sleep and REM sleep. - Reduced sleep efficiency at 67.2%. - Oxygen desaturation; the nadir recording was inaccurate - The patient snored  with moderate snoring volume. - No cardiac abnormalities were noted during this study. - Clinically significant periodic limb movements did not occur during sleep. No significant associated arousals.  DIAGNOSIS - Obstructive Sleep Apnea (327.23 [G47.33 ICD-10]) - Nocturnal Hypoxemia (327.26 [G47.36 ICD-10])  RECOMMENDATIONS - Recommend expeditious therapeutic CPAP titration to determine optimal pressure required to alleviate sleep disordered breathing. - Effort should be made to optimize nasal and oral pharyngeal patency - Positional therapy avoiding supine position during sleep. - Avoid alcohol, sedatives and other CNS depressants that may worsen sleep apnea and disrupt  normal sleep architecture. - Sleep hygiene should be reviewed to assess factors that may improve sleep quality. - Weight management and regular exercise should be initiated.  [Electronically signed] 11/27/2017 01:47 PM  Shelva Majestic MD,FACC, ABSM Diplomate, American Board of Sleep Medicine   NPI: [id number]  [place] PH: [phone]   FX: [phone] [place]

## 2017-11-29 ENCOUNTER — Ambulatory Visit (HOSPITAL_COMMUNITY)
Admission: RE | Admit: 2017-11-29 | Discharge: 2017-11-29 | Disposition: A | Discharge status: Home / Self Care | Payer: Medicare Other | Source: Ambulatory Visit | Attending: Cardiology | Admitting: Cardiology

## 2017-11-29 DIAGNOSIS — I272 Pulmonary hypertension, unspecified: Secondary | ICD-10-CM | POA: Insufficient documentation

## 2017-11-29 LAB — BASIC METABOLIC PANEL
ANION GAP: 11 (ref 5–15)
BUN: 24 mg/dL — ABNORMAL HIGH (ref 6–20)
CALCIUM: 8.5 mg/dL — AB (ref 8.9–10.3)
CO2: 28 mmol/L (ref 22–32)
Chloride: 97 mmol/L — ABNORMAL LOW (ref 101–111)
Creatinine, Ser: 1.27 mg/dL — ABNORMAL HIGH (ref 0.61–1.24)
GFR, EST NON AFRICAN AMERICAN: 58 mL/min — AB (ref >60)
GLUCOSE: 164 mg/dL — AB (ref 65–99)
Potassium: 4 mmol/L (ref 3.5–5.1)
Sodium: 136 mmol/L (ref 135–145)

## 2017-11-30 DIAGNOSIS — I272 Pulmonary hypertension, unspecified: Secondary | ICD-10-CM | POA: Diagnosis not present

## 2017-11-30 DIAGNOSIS — I5032 Chronic diastolic (congestive) heart failure: Secondary | ICD-10-CM | POA: Diagnosis not present

## 2017-11-30 DIAGNOSIS — R0902 Hypoxemia: Secondary | ICD-10-CM | POA: Diagnosis not present

## 2017-11-30 DIAGNOSIS — M2550 Pain in unspecified joint: Secondary | ICD-10-CM | POA: Diagnosis not present

## 2017-12-06 ENCOUNTER — Ambulatory Visit (HOSPITAL_COMMUNITY)
Admission: RE | Admit: 2017-12-06 | Discharge: 2017-12-06 | Disposition: A | Discharge status: Home / Self Care | Payer: Medicare Other | Source: Ambulatory Visit | Attending: Cardiology | Admitting: Cardiology

## 2017-12-06 ENCOUNTER — Other Ambulatory Visit: Payer: Self-pay

## 2017-12-06 VITALS — BP 133/80 | HR 70 | Wt >= 6400 oz

## 2017-12-06 DIAGNOSIS — J449 Chronic obstructive pulmonary disease, unspecified: Secondary | ICD-10-CM | POA: Diagnosis not present

## 2017-12-06 DIAGNOSIS — E669 Obesity, unspecified: Secondary | ICD-10-CM | POA: Insufficient documentation

## 2017-12-06 DIAGNOSIS — I481 Persistent atrial fibrillation: Secondary | ICD-10-CM

## 2017-12-06 DIAGNOSIS — I2781 Cor pulmonale (chronic): Secondary | ICD-10-CM | POA: Diagnosis not present

## 2017-12-06 DIAGNOSIS — L97909 Non-pressure chronic ulcer of unspecified part of unspecified lower leg with unspecified severity: Secondary | ICD-10-CM

## 2017-12-06 DIAGNOSIS — K7460 Unspecified cirrhosis of liver: Secondary | ICD-10-CM | POA: Diagnosis not present

## 2017-12-06 DIAGNOSIS — I482 Chronic atrial fibrillation: Secondary | ICD-10-CM | POA: Insufficient documentation

## 2017-12-06 DIAGNOSIS — Z7901 Long term (current) use of anticoagulants: Secondary | ICD-10-CM | POA: Diagnosis not present

## 2017-12-06 DIAGNOSIS — Z79890 Hormone replacement therapy (postmenopausal): Secondary | ICD-10-CM | POA: Diagnosis not present

## 2017-12-06 DIAGNOSIS — G4733 Obstructive sleep apnea (adult) (pediatric): Secondary | ICD-10-CM | POA: Diagnosis not present

## 2017-12-06 DIAGNOSIS — Z87891 Personal history of nicotine dependence: Secondary | ICD-10-CM | POA: Diagnosis not present

## 2017-12-06 DIAGNOSIS — Z6841 Body Mass Index (BMI) 40.0 and over, adult: Secondary | ICD-10-CM | POA: Insufficient documentation

## 2017-12-06 DIAGNOSIS — E785 Hyperlipidemia, unspecified: Secondary | ICD-10-CM | POA: Diagnosis not present

## 2017-12-06 DIAGNOSIS — Z79899 Other long term (current) drug therapy: Secondary | ICD-10-CM | POA: Insufficient documentation

## 2017-12-06 DIAGNOSIS — I5032 Chronic diastolic (congestive) heart failure: Secondary | ICD-10-CM | POA: Diagnosis not present

## 2017-12-06 DIAGNOSIS — C8290 Follicular lymphoma, unspecified, unspecified site: Secondary | ICD-10-CM | POA: Insufficient documentation

## 2017-12-06 DIAGNOSIS — I272 Pulmonary hypertension, unspecified: Secondary | ICD-10-CM | POA: Insufficient documentation

## 2017-12-06 DIAGNOSIS — I4819 Other persistent atrial fibrillation: Secondary | ICD-10-CM

## 2017-12-06 DIAGNOSIS — Z9981 Dependence on supplemental oxygen: Secondary | ICD-10-CM | POA: Diagnosis not present

## 2017-12-06 DIAGNOSIS — F329 Major depressive disorder, single episode, unspecified: Secondary | ICD-10-CM | POA: Insufficient documentation

## 2017-12-06 MED ORDER — TORSEMIDE 20 MG PO TABS: 20.0000 mg | ORAL_TABLET | Freq: Two times a day (BID) | ORAL | 3 refills | Status: DC

## 2017-12-06 NOTE — Patient Instructions (Signed)
Stop Asprin  Stop Furosemide   Start Torsemide 60 mg (3 tabs) daily  You have been referred to [place] at [facility] (they will call you)   Unna boots will be placed by Home Health RN   CPAP arrangement will be made via telephone (they will call you)   Your physician recommends that you return for lab work in: 10 days  Your physician recommends that you schedule a follow-up appointment in: 2 weeks in APP

## 2017-12-07 ENCOUNTER — Telehealth: Payer: Self-pay | Admitting: *Deleted

## 2017-12-07 DIAGNOSIS — I4819 Other persistent atrial fibrillation: Secondary | ICD-10-CM

## 2017-12-07 DIAGNOSIS — I272 Pulmonary hypertension, unspecified: Secondary | ICD-10-CM

## 2017-12-07 DIAGNOSIS — G4733 Obstructive sleep apnea (adult) (pediatric): Secondary | ICD-10-CM

## 2017-12-07 NOTE — Telephone Encounter (Signed)
-----   Message from Troy Sine, MD sent at 11/27/2017  1:51 PM EST ----- Albert James  , please notify patient that he has severe obstructive sleep apnea.  Arrange for expeditious CPAP titration as soon as possible.

## 2017-12-07 NOTE — Progress Notes (Signed)
1/23-see telephone note, patient aware and CPAP titration has been ordered and sent to precert/scheduling to expedite.

## 2017-12-07 NOTE — Progress Notes (Signed)
PCP: Dr. Lynnda Child Cardiology: Dr. Martinique HF Cardiology: Dr. Aundra Dubin  62 yo with permanent atrial fibrillation, NASH with cirrhosis, suspected OHS/OSA, and chronic diastolic CHF/RV failure was referred by Dr. Martinique for evaluation of CHF.   Patient has had atrial fibrillation since at least 2015. He has had problems with peripheral edema x years. He has also had some degree of dyspnea for a long time.  However, towards the end of 2018, dyspnea became markedly worse.  At last appointment, I started him on home oxygen.   Echo was done in 11/18 showing EF 50-55%, septal flattening, severe RV dilation, PASP 77 mmHg. LHC/RHC in 1/19 showed no CAD, evidence for RV failure and severe pulmonary hypertension.  PFTs in 1/19 showed severe COPD.  V/Q scan in 1/19 showed no evidence for chronic PEs.  Sleep study showed severe OSA.    Patient continues to be very limited.  Uses a walker or wheelchair.  Not very active.  He is short of breath walking around the house.  He is now using home oxygen at all times.  No lightheadedness, no chest pain.  Weight is not significantly changed despite increasing Lasix at last appointment.  He has a non-healing ulceration on the inside of his right thigh.  Still with marked peripheral edema.   Labs (10/18): LDL 101, TSH normal, K 3.9, creatinine 1.2.  Labs (1/19): K 4, creatinine 1.27  PMH: 1. Non-hodgkins lymphoma: follicular small cell lymphoma.  S/p treatment, in clinical remission.  2. Cirrhosis: Suspect NASH.  - Abdominal US (10/18): Suggestive of cirrhosis.  3. Hyperlipidemia.  4. Obesity 5. Atrial fibrillation: Suspected permanent. He appears to have been in atrial fibrillation at least back to 2015.  6. Depression 7. Venous insufficiency 8. OHS/OSA: Sleep study with severe OSA.  9. H/o vertebral osteomyelitis.  10. OA knees 11. Chronic diastolic CHF/RV failure: Echo (11/18) with EF 50-55%, septal flattening, RV severely dilated with normal systolic function, PASP  77 mmHg, moderate TR.  12. Pulmonary hypertension: Suspect due to OHS/OSA and COPD.  - RHC (1/19): mean RA 20, PA 87/37 mean 52, mean PCWP 16, CI 2.12, PVR 5.6 WU.  - V/Q scan 1/19 with no evidence for chronic PE.                                                                                                                 13. Coronary angiography (1/19): No significant CAD.  14. COPD: PFTs in 1/19 suggested severe COPD.   Social History   Socioeconomic History  . Marital status: Divorced    Spouse name: Not on file  . Number of children: 2  . Years of education: Not on file  . Highest education level: Not on file  Social Needs  . Financial resource strain: Not on file  . Food insecurity - worry: Not on file  . Food insecurity - inability: Not on file  . Transportation needs - medical: Not on file  . Transportation needs - non-medical: Not on file  Occupational  History  . Occupation: disabled  Tobacco Use  . Smoking status: Former Smoker    Packs/day: 0.50    Years: 8.00    Pack years: 4.00    Types: Cigarettes    Start date: 09/26/1966    Last attempt to quit: 06/26/1989    Years since quitting: 28.4  . Smokeless tobacco: Never Used  . Tobacco comment: quit 38 years ago  Substance and Sexual Activity  . Alcohol use: No    Alcohol/week: 0.0 oz  . Drug use: No  . Sexual activity: Not on file  Other Topics Concern  . Not on file  Social History Narrative  . Not on file   Family History  Problem Relation Age of Onset  . Cancer Father        pacemaker  . Bradycardia Father    ROS: All systems reviewed and negative except as per HPI.   Current Outpatient Medications  Medication Sig Dispense Refill  . apixaban (ELIQUIS) 5 MG TABS tablet Take 5 mg by mouth 2 (two) times daily.    . cycloSPORINE (RESTASIS) 0.05 % ophthalmic emulsion Place 1 drop into both eyes 2 (two) times daily as needed (for eye irritation/dryness).    Marland Kitchen diclofenac sodium (VOLTAREN) 1 % GEL Apply 1 g  topically 4 (four) times daily as needed (for knee pain.).    Marland Kitchen etodolac (LODINE) 400 MG tablet Take 400 mg by mouth 2 (two) times daily.    . fluticasone (FLONASE) 50 MCG/ACT nasal spray Place 2 sprays into both nostrils at bedtime.     Marland Kitchen lactulose (CHRONULAC) 10 GM/15ML solution Take 10 g by mouth daily.     Marland Kitchen levothyroxine (SYNTHROID, LEVOTHROID) 125 MCG tablet Take 125 mcg by mouth daily before breakfast.   0  . nadolol (CORGARD) 20 MG tablet Take 20 mg by mouth daily.     Marland Kitchen NARCAN 4 MG/0.1ML LIQD nasal spray kit Place 1 spray into the nose as needed (For accidental opoid overdose).   0  . omega-3 acid ethyl esters (LOVAZA) 1 g capsule Take 2 g by mouth 2 (two) times daily.   0  . oxyCODONE (OXYCONTIN) 40 mg 12 hr tablet Take 1 tablet (40 mg total) by mouth every 12 (twelve) hours. Do not fill until 07/25/2017 60 tablet 0  . Oxycodone HCl 20 MG TABS Take 1 tablet (20 mg total) by mouth every 8 (eight) hours as needed. (Patient taking differently: Take 20 mg by mouth every 8 (eight) hours as needed (for breakthrough pain.). ) 90 tablet 0  . spironolactone (ALDACTONE) 100 MG tablet Take 100 mg by mouth 2 (two) times daily.      Marland Kitchen torsemide (DEMADEX) 20 MG tablet Take 1 tablet (20 mg total) by mouth 2 (two) times daily. 90 tablet 3   No current facility-administered medications for this encounter.    BP 133/80   Pulse 70   Wt (!) 458 lb 8 oz (208 kg)   SpO2 90% Comment: on 2L of O2  BMI 63.06 kg/m  General: NAD, obese Neck: Thick, JVP 9-10 cm, no thyromegaly or thyroid nodule.  Lungs: Clear to auscultation bilaterally with normal respiratory effort. CV: Nonpalpable PMI.  Heart irregular S1/S2, no S3/S4, no murmur.  3+ edema into the thighs.  No carotid bruit.  Unable to palpate pedal pulses.  Abdomen: Soft, nontender, no hepatosplenomegaly, no distention.  Skin: Intact without lesions or rashes.  Neurologic: Alert and oriented x 3.  Psych: Normal affect. Extremities: No  clubbing or  cyanosis. Right inner thigh non-healing ulceration.  HEENT: Normal.   1. Chronic diastolic CHF with prominent RV failure: Echo most remarkable for pulmonary hypertension and RV failure (11/18). I suspect RV failure is triggered by pulmonary hypertension.  Underlying OHS/OSA and COPD have likely caused pulmonary hypertension/RV failure.  NYHA class IIIb symptoms, he remains markedly volume overloaded. He needs diuresis to decongest RV.  - Stop Lasix.  Start torsemide 60 mg daily. BMET 10 days.  - Will arrange for home health to place Unna boots.  2. Pulmonary hypertension: Severe by echo and RHC (pulmonary arterial hypertension).  Suspect group 3 disease due to COPD (severe obstruction on PFTs) and OSA/OHS.  He is on home oxygen but has severe OSA that is not currently treated.  V/Q scan not suggestive of chronic PEs.  - He needs supplemental oxygen. - He needs to start CPAP, will send message to Dr. Claiborne Billings to expedite this.   - Probably would not benefit significantly from pulmonary vasodilators.  3. OHS/OSA: On home oxygen, as above, will try to expedite CPAP.   4. Cirrhosis: Suspected NASH.  Denies ETOH. Cannot totally rule out component of portopulmonary hypertension. He is on spironolactone at high dose.  BMET 10 days.  5. Atrial fibrillation: Chronic, suspect permanent (present since at least 2015).  Rate is reasonably controlled.   It is unlikely that he would convert to NSR after years of atrial fibrillation.  - Continue Eliquis 5 mg bid.   Followup in 2 wks with NP/PA.   [name] 12/07/2017

## 2017-12-07 NOTE — Telephone Encounter (Signed)
Patient aware of sleep study results and recommendations.  Verbalized understanding.   CPAP titration ordered and sent to precert to expedite.

## 2017-12-08 ENCOUNTER — Telehealth (HOSPITAL_COMMUNITY): Payer: Self-pay | Admitting: Pharmacist

## 2017-12-08 ENCOUNTER — Encounter (HOSPITAL_COMMUNITY): Payer: Self-pay | Admitting: Pharmacist

## 2017-12-08 NOTE — Telephone Encounter (Signed)
Torsemide PA denied by [place] [employer] because they want patient to have tried and failed furosemide and bumetanide. Per discussion with Dr. Aundra Dubin, patient has tried both in the past so will send appeal. In the meantime, I have advised Albert James to continue furosemide until appeal decision is made. He is agreeable to the plan.   Ruta Hinds. Velva Harman, PharmD, BCPS, CPP Clinical Pharmacist Phone: [phone] 12/08/2017 10:55 AM

## 2017-12-09 ENCOUNTER — Other Ambulatory Visit: Payer: Self-pay | Admitting: *Deleted

## 2017-12-09 DIAGNOSIS — Z8572 Personal history of non-Hodgkin lymphomas: Secondary | ICD-10-CM

## 2017-12-09 MED ORDER — OXYCODONE HCL 20 MG PO TABS: 20.0000 mg | ORAL_TABLET | Freq: Three times a day (TID) | ORAL | 0 refills | Status: DC | PRN

## 2017-12-09 MED ORDER — OXYCODONE HCL ER 40 MG PO T12A: 40.0000 mg | EXTENDED_RELEASE_TABLET | Freq: Two times a day (BID) | ORAL | 0 refills | Status: DC

## 2017-12-10 ENCOUNTER — Telehealth: Payer: Self-pay | Admitting: Physician Assistant

## 2017-12-10 MED ORDER — TORSEMIDE 20 MG PO TABS: 60.0000 mg | ORAL_TABLET | Freq: Every day | ORAL | 11 refills | Status: DC

## 2017-12-10 NOTE — Telephone Encounter (Signed)
Patient recently seen by Dr. [name] and Furosemide changed to Torsemide 60 mg Once daily.  Rx sent to pharmacy for 20 mg Twice daily.  PLAN:  1. Will change Rx with his pharmacy by phone 2. Chart updated to reflect Torsemide 60 mg QD Albert Dopp, PA-C    12/10/2017 1:04 PM

## 2017-12-15 ENCOUNTER — Telehealth (HOSPITAL_COMMUNITY): Payer: Self-pay | Admitting: Pharmacist

## 2017-12-15 NOTE — Telephone Encounter (Signed)
Torsemide appeal approved by [place] through 12/07/18. Relayed info to [name]. He also stated that since switching to torsemide daily from furosemide BID, his leg wound is less dry in the afternoon. I have advised him to take torsemide 40 mg QAM and 20 mg QPM to see if this helps.   Ruta Hinds. Velva Harman, PharmD, BCPS, CPP Clinical Pharmacist Phone: [phone] 12/15/2017 2:30 PM

## 2017-12-20 ENCOUNTER — Ambulatory Visit (HOSPITAL_COMMUNITY)
Admission: RE | Admit: 2017-12-20 | Discharge: 2017-12-20 | Disposition: A | Discharge status: Home / Self Care | Payer: Medicare Other | Source: Ambulatory Visit | Attending: Cardiology | Admitting: Cardiology

## 2017-12-20 VITALS — BP 98/72 | HR 66 | Wt >= 6400 oz

## 2017-12-20 DIAGNOSIS — Z809 Family history of malignant neoplasm, unspecified: Secondary | ICD-10-CM | POA: Diagnosis not present

## 2017-12-20 DIAGNOSIS — Z87891 Personal history of nicotine dependence: Secondary | ICD-10-CM | POA: Diagnosis not present

## 2017-12-20 DIAGNOSIS — I872 Venous insufficiency (chronic) (peripheral): Secondary | ICD-10-CM

## 2017-12-20 DIAGNOSIS — K7460 Unspecified cirrhosis of liver: Secondary | ICD-10-CM | POA: Diagnosis not present

## 2017-12-20 DIAGNOSIS — G4733 Obstructive sleep apnea (adult) (pediatric): Secondary | ICD-10-CM

## 2017-12-20 DIAGNOSIS — Z8572 Personal history of non-Hodgkin lymphomas: Secondary | ICD-10-CM | POA: Diagnosis not present

## 2017-12-20 DIAGNOSIS — Z8249 Family history of ischemic heart disease and other diseases of the circulatory system: Secondary | ICD-10-CM | POA: Insufficient documentation

## 2017-12-20 DIAGNOSIS — I5032 Chronic diastolic (congestive) heart failure: Secondary | ICD-10-CM | POA: Diagnosis not present

## 2017-12-20 DIAGNOSIS — I272 Pulmonary hypertension, unspecified: Secondary | ICD-10-CM | POA: Diagnosis not present

## 2017-12-20 DIAGNOSIS — Z9889 Other specified postprocedural states: Secondary | ICD-10-CM | POA: Diagnosis not present

## 2017-12-20 DIAGNOSIS — I890 Lymphedema, not elsewhere classified: Secondary | ICD-10-CM

## 2017-12-20 DIAGNOSIS — Z7902 Long term (current) use of antithrombotics/antiplatelets: Secondary | ICD-10-CM | POA: Insufficient documentation

## 2017-12-20 DIAGNOSIS — Z955 Presence of coronary angioplasty implant and graft: Secondary | ICD-10-CM | POA: Insufficient documentation

## 2017-12-20 DIAGNOSIS — E785 Hyperlipidemia, unspecified: Secondary | ICD-10-CM | POA: Insufficient documentation

## 2017-12-20 DIAGNOSIS — J449 Chronic obstructive pulmonary disease, unspecified: Secondary | ICD-10-CM | POA: Insufficient documentation

## 2017-12-20 DIAGNOSIS — E669 Obesity, unspecified: Secondary | ICD-10-CM | POA: Insufficient documentation

## 2017-12-20 DIAGNOSIS — Z79899 Other long term (current) drug therapy: Secondary | ICD-10-CM | POA: Insufficient documentation

## 2017-12-20 DIAGNOSIS — I481 Persistent atrial fibrillation: Secondary | ICD-10-CM

## 2017-12-20 DIAGNOSIS — Z9981 Dependence on supplemental oxygen: Secondary | ICD-10-CM | POA: Insufficient documentation

## 2017-12-20 DIAGNOSIS — M170 Bilateral primary osteoarthritis of knee: Secondary | ICD-10-CM | POA: Insufficient documentation

## 2017-12-20 DIAGNOSIS — I4891 Unspecified atrial fibrillation: Secondary | ICD-10-CM | POA: Insufficient documentation

## 2017-12-20 DIAGNOSIS — I4819 Other persistent atrial fibrillation: Secondary | ICD-10-CM

## 2017-12-20 LAB — CBC
HEMATOCRIT: 41.4 % (ref 39.0–52.0)
HEMOGLOBIN: 12.7 g/dL — AB (ref 13.0–17.0)
MCH: 27.2 pg (ref 26.0–34.0)
MCHC: 30.7 g/dL (ref 30.0–36.0)
MCV: 88.7 fL (ref 78.0–100.0)
Platelets: 132 10*3/uL — ABNORMAL LOW (ref 150–400)
RBC: 4.67 MIL/uL (ref 4.22–5.81)
RDW: 19.3 % — ABNORMAL HIGH (ref 11.5–15.5)
WBC: 7.1 10*3/uL (ref 4.0–10.5)

## 2017-12-20 LAB — BASIC METABOLIC PANEL
ANION GAP: 13 (ref 5–15)
BUN: 33 mg/dL — ABNORMAL HIGH (ref 6–20)
CHLORIDE: 97 mmol/L — AB (ref 101–111)
CO2: 28 mmol/L (ref 22–32)
Calcium: 8.8 mg/dL — ABNORMAL LOW (ref 8.9–10.3)
Creatinine, Ser: 1.46 mg/dL — ABNORMAL HIGH (ref 0.61–1.24)
GFR calc Af Amer: 57 mL/min — ABNORMAL LOW (ref >60)
GFR calc non Af Amer: 49 mL/min — ABNORMAL LOW (ref >60)
Glucose, Bld: 115 mg/dL — ABNORMAL HIGH (ref 65–99)
POTASSIUM: 3.7 mmol/L (ref 3.5–5.1)
Sodium: 138 mmol/L (ref 135–145)

## 2017-12-20 NOTE — Progress Notes (Signed)
Advanced Heart Failure Clinic Note   PCP: Dr. Lynnda Child Cardiology: Dr. Martinique HF Cardiology: Dr. Lynelle Doctor is a 62 y.o. male with permanent atrial fibrillation, NASH with cirrhosis, suspected OHS/OSA, and chronic diastolic CHF/RV failure was referred by Dr. Martinique for evaluation of CHF.   Patient has had atrial fibrillation since at least 2015. He has had problems with peripheral edema x years. He has also had some degree of dyspnea for a long time.  However, towards the end of 2018, dyspnea became markedly worse.  At last appointment, I started him on home oxygen.   Echo was done in 11/18 showing EF 50-55%, septal flattening, severe RV dilation, PASP 77 mmHg. LHC/RHC in 1/19 showed no CAD, evidence for RV failure and severe pulmonary hypertension.  PFTs in 1/19 showed severe COPD.  V/Q scan in 1/19 showed no evidence for chronic PEs.  Sleep study showed severe OSA.    Pt presents today for 2 week follow up. Switched from lasix to torsemide at last visit. Down 7 lbs. Overall feeling about the same. Not very active. Can only walk about 10 feet at a time. Gets around the house by leaning on furniture. Wearing 2-3L of O2 at all times. He has a non-healing ulcer on inside of right thigh. Has not had antibiotics or seen anyone for this. Starts with wound clinic next week. Doesn't have CPAP yet, has titration this Thursday.   Labs (10/18): LDL 101, TSH normal, K 3.9, creatinine 1.2.  Labs (1/19): K 4, creatinine 1.27  Review of systems complete and found to be negative unless listed in HPI.    PMH: 1. Non-hodgkins lymphoma: follicular small cell lymphoma.  S/p treatment, in clinical remission.  2. Cirrhosis: Suspect NASH.  - Abdominal US (10/18): Suggestive of cirrhosis.  3. Hyperlipidemia.  4. Obesity 5. Atrial fibrillation: Suspected permanent. He appears to have been in atrial fibrillation at least back to 2015.  6. Depression 7. Venous insufficiency 8. OHS/OSA: Sleep  study with severe OSA.  9. H/o vertebral osteomyelitis.  10. OA knees 11. Chronic diastolic CHF/RV failure: Echo (11/18) with EF 50-55%, septal flattening, RV severely dilated with normal systolic function, PASP 77 mmHg, moderate TR.  12. Pulmonary hypertension: Suspect due to OHS/OSA and COPD.  - RHC (1/19): mean RA 20, PA 87/37 mean 52, mean PCWP 16, CI 2.12, PVR 5.6 WU.  - V/Q scan 1/19 with no evidence for chronic PE.                                                                                                                 13. Coronary angiography (1/19): No significant CAD.  14. COPD: PFTs in 1/19 suggested severe COPD.   Social History   Socioeconomic History  . Marital status: Divorced    Spouse name: Not on file  . Number of children: 2  . Years of education: Not on file  . Highest education level: Not on file  Social Needs  . Financial resource strain: Not  on file  . Food insecurity - worry: Not on file  . Food insecurity - inability: Not on file  . Transportation needs - medical: Not on file  . Transportation needs - non-medical: Not on file  Occupational History  . Occupation: disabled  Tobacco Use  . Smoking status: Former Smoker    Packs/day: 0.50    Years: 8.00    Pack years: 4.00    Types: Cigarettes    Start date: 09/26/1966    Last attempt to quit: 06/26/1989    Years since quitting: 28.5  . Smokeless tobacco: Never Used  . Tobacco comment: quit 38 years ago  Substance and Sexual Activity  . Alcohol use: No    Alcohol/week: 0.0 oz  . Drug use: No  . Sexual activity: Not on file  Other Topics Concern  . Not on file  Social History Narrative  . Not on file   Family History  Problem Relation Age of Onset  . Cancer Father        pacemaker  . Bradycardia Father    Current Outpatient Medications  Medication Sig Dispense Refill  . apixaban (ELIQUIS) 5 MG TABS tablet Take 5 mg by mouth 2 (two) times daily.    . cycloSPORINE (RESTASIS) 0.05 %  ophthalmic emulsion Place 1 drop into both eyes 2 (two) times daily as needed (for eye irritation/dryness).    Marland Kitchen diclofenac sodium (VOLTAREN) 1 % GEL Apply 1 g topically 4 (four) times daily as needed (for knee pain.).    Marland Kitchen etodolac (LODINE) 400 MG tablet Take 400 mg by mouth 2 (two) times daily.    . fluticasone (FLONASE) 50 MCG/ACT nasal spray Place 2 sprays into both nostrils at bedtime.     Marland Kitchen lactulose (CHRONULAC) 10 GM/15ML solution Take 10 g by mouth daily.     Marland Kitchen levothyroxine (SYNTHROID, LEVOTHROID) 125 MCG tablet Take 125 mcg by mouth daily before breakfast.   0  . nadolol (CORGARD) 20 MG tablet Take 20 mg by mouth daily.     Marland Kitchen NARCAN 4 MG/0.1ML LIQD nasal spray kit Place 1 spray into the nose as needed (For accidental opoid overdose).   0  . omega-3 acid ethyl esters (LOVAZA) 1 g capsule Take 2 g by mouth 2 (two) times daily.   0  . oxyCODONE (OXYCONTIN) 40 mg 12 hr tablet Take 1 tablet (40 mg total) by mouth every 12 (twelve) hours. Do not fill until 07/25/2017 60 tablet 0  . Oxycodone HCl 20 MG TABS Take 1 tablet (20 mg total) by mouth every 8 (eight) hours as needed. 90 tablet 0  . spironolactone (ALDACTONE) 100 MG tablet Take 100 mg by mouth 2 (two) times daily.      Marland Kitchen torsemide (DEMADEX) 20 MG tablet Take 2 tablets (40 mg) in the morning and 1 tablet (20 mg) in the afternoon.     No current facility-administered medications for this encounter.    Vitals:   12/20/17 1420  BP: 98/72  Pulse: 66  SpO2: (!) 88%  Weight: (!) 451 lb 12.8 oz (204.9 kg)   Wt Readings from Last 3 Encounters:  12/20/17 (!) 451 lb 12.8 oz (204.9 kg)  12/06/17 (!) 458 lb 8 oz (208 kg)  11/21/17 (!) 454 lb (205.9 kg)   Physical Exam General: NAD, Obese.  Neck: Supple. JVP 7-8 cm. Carotids 2+ bilat; no bruits. No thyromegaly or nodule noted. Cor: PMI nondisplaced. RRR,  Lungs: CTAB, normal effort. Abdomen: Soft, non-tender, non-distended, no HSM.  No bruits or masses. +BS  Extremities: No cyanosis or  clubbing. 3+ edema into the thighs with significant lymph edema. Chronic venous stasis changes with poorly healing ulcer on inner right thigh.  Neuro: Alert & orientedx3, cranial nerves grossly intact. moves all 4 extremities w/o difficulty. Affect pleasant   1. Chronic diastolic CHF with prominent RV failure: - Echo most remarkable for pulmonary hypertension and RV failure (11/18).  -Suspect RV failure is triggered by pulmonary hypertension.  Underlying OHS/OSA and COPD have likely caused pulmonary hypertension/RV failure. - NYHA 3-3b class - Volume status remains elevated, but improved. Peripheral > Central volume overload.  - Continue torsemide  40 mg q am and 20 mq q pm. Can take additional 20 mg in pm as needed.  - Will ask [facility] to place UNNA boots.  - Sees wound clinic next week for poorly healing ulcer. - Reinforced fluid restriction to < 2 L daily, sodium restriction to less than 2000 mg daily, and the importance of daily weights. 2. Pulmonary hypertension: Severe by echo and RHC (pulmonary arterial hypertension).  Suspect group 3 disease due to COPD (severe obstruction on PFTs) and OSA/OHS.  He is on home oxygen but has severe OSA that is not currently treated.  V/Q scan not suggestive of chronic PEs.  - He is now on O2.  - He still neds to start CPAP.  - Probably would not benefit significantly from pulmonary vasodilators.  3. OHS/OSA:  - On home O2.  - Needs CPAP.  4. Cirrhosis: Suspected NASH.  Denies ETOH. Cannot totally rule out component of portopulmonary hypertension. He is on spironolactone at high dose.   - BMET today.  5. Atrial fibrillation: Chronic, suspect permanent (present since at least 2015). - Rate reasonable controlled.  - Continue Eliquis 5 mg BID.   Labs today. 2 week follow up. Had long discussion about therapeutic options for edema and lymphedema. Reviewed alarm symptoms of poorly healing ulcer such as purulence, fever, or bloody drainage.   Shirley Friar, PA-C  12/20/2017   Greater than 50% of the 25 minute visit was spent in counseling/coordination of care regarding disease state education, salt/fluid restriction, sliding scale diuretics, and medication compliance.

## 2017-12-20 NOTE — Patient Instructions (Signed)
Routine lab work today. Will notify you of abnormal results, otherwise no news is good news!  Will order unna boots for both legs for swelling. [facility] will contact you by phone to schedule appointment in-home.  Follow up 2 weeks with Oda Kilts PA-C.  ________________________________________________________ Albert James Code: 9001  Follow up 6 weeks with Dr. Aundra Dubin.  _________________________________________________________ Albert James Code: 9002  Take all medication as prescribed the day of your appointment. Bring all medications with you to your appointment.  Do the following things EVERYDAY: 1) Weigh yourself in the morning before breakfast. Write it down and keep it in a log. 2) Take your medicines as prescribed 3) Eat low salt foods-Limit salt (sodium) to 2000 mg per day.  4) Stay as active as you can everyday 5) Limit all fluids for the day to less than 2 liters

## 2017-12-21 DIAGNOSIS — M549 Dorsalgia, unspecified: Secondary | ICD-10-CM | POA: Diagnosis not present

## 2017-12-21 DIAGNOSIS — L97111 Non-pressure chronic ulcer of right thigh limited to breakdown of skin: Secondary | ICD-10-CM | POA: Diagnosis not present

## 2017-12-21 DIAGNOSIS — G8929 Other chronic pain: Secondary | ICD-10-CM | POA: Diagnosis not present

## 2017-12-21 DIAGNOSIS — I5032 Chronic diastolic (congestive) heart failure: Secondary | ICD-10-CM | POA: Diagnosis not present

## 2017-12-21 DIAGNOSIS — I482 Chronic atrial fibrillation: Secondary | ICD-10-CM | POA: Diagnosis not present

## 2017-12-21 DIAGNOSIS — I890 Lymphedema, not elsewhere classified: Secondary | ICD-10-CM | POA: Diagnosis not present

## 2017-12-21 DIAGNOSIS — E669 Obesity, unspecified: Secondary | ICD-10-CM | POA: Diagnosis not present

## 2017-12-21 DIAGNOSIS — M170 Bilateral primary osteoarthritis of knee: Secondary | ICD-10-CM | POA: Diagnosis not present

## 2017-12-21 DIAGNOSIS — G4733 Obstructive sleep apnea (adult) (pediatric): Secondary | ICD-10-CM | POA: Diagnosis not present

## 2017-12-21 DIAGNOSIS — I272 Pulmonary hypertension, unspecified: Secondary | ICD-10-CM | POA: Diagnosis not present

## 2017-12-21 DIAGNOSIS — K7460 Unspecified cirrhosis of liver: Secondary | ICD-10-CM | POA: Diagnosis not present

## 2017-12-21 DIAGNOSIS — I872 Venous insufficiency (chronic) (peripheral): Secondary | ICD-10-CM | POA: Diagnosis not present

## 2017-12-21 DIAGNOSIS — J449 Chronic obstructive pulmonary disease, unspecified: Secondary | ICD-10-CM | POA: Diagnosis not present

## 2017-12-22 ENCOUNTER — Ambulatory Visit (HOSPITAL_BASED_OUTPATIENT_CLINIC_OR_DEPARTMENT_OTHER): Payer: Medicare Other | Attending: Cardiovascular Disease | Admitting: Cardiovascular Disease

## 2017-12-22 DIAGNOSIS — I509 Heart failure, unspecified: Secondary | ICD-10-CM | POA: Insufficient documentation

## 2017-12-22 DIAGNOSIS — I4819 Other persistent atrial fibrillation: Secondary | ICD-10-CM

## 2017-12-22 DIAGNOSIS — I4891 Unspecified atrial fibrillation: Secondary | ICD-10-CM | POA: Insufficient documentation

## 2017-12-22 DIAGNOSIS — G4730 Sleep apnea, unspecified: Secondary | ICD-10-CM | POA: Diagnosis present

## 2017-12-22 DIAGNOSIS — G4733 Obstructive sleep apnea (adult) (pediatric): Secondary | ICD-10-CM | POA: Diagnosis not present

## 2017-12-22 DIAGNOSIS — G4736 Sleep related hypoventilation in conditions classified elsewhere: Secondary | ICD-10-CM | POA: Diagnosis not present

## 2017-12-22 DIAGNOSIS — I272 Pulmonary hypertension, unspecified: Secondary | ICD-10-CM

## 2017-12-23 ENCOUNTER — Telehealth (HOSPITAL_COMMUNITY): Payer: Self-pay | Admitting: *Deleted

## 2017-12-23 NOTE — Telephone Encounter (Signed)
Albert James with [facility] called to let us know that patient wants to wait to start the [place] until his wound center appointment next week.  He is wearing compression stockings for now.  [facility] will go back after his wound center appointment to apply unna boots.

## 2017-12-25 ENCOUNTER — Encounter (HOSPITAL_BASED_OUTPATIENT_CLINIC_OR_DEPARTMENT_OTHER): Payer: Self-pay | Admitting: Cardiovascular Disease

## 2017-12-25 NOTE — Procedures (Signed)
Patient Name: Albert James, Albert James Date: 12/22/2017 Gender: Male D.O.B: 06-19-1956 Age (years): 61 Referring Provider: Peter M Martinique Height (inches): 70 Interpreting Physician: Shelva Majestic MD, ABSM Weight (lbs): 450 RPSGT: Lanae Boast BMI: 65 MRN: [id number] Neck Size: 21.00  CLINICAL INFORMATION The patient is referred for a CPAP titration to treat sleep apnea.  Date of NPSG: 11/13/2017: AHI 87.1/h; RDI 87.7/h; REM sleep was not achieved  SLEEP STUDY TECHNIQUE As per the AASM Manual for the Scoring of Sleep and Associated Events v2.3 (April 2016) with a hypopnea requiring 4% desaturations.  The channels recorded and monitored were frontal, central and occipital EEG, electrooculogram (EOG), submentalis EMG (chin), nasal and oral airflow, thoracic and abdominal wall motion, anterior tibialis EMG, snore microphone, electrocardiogram, and pulse oximetry. Continuous positive airway pressure (CPAP) was initiated at the beginning of the study and titrated to treat sleep-disordered breathing.  MEDICATIONS Medications self-administered by patient taken the night of the study : OXYCODONE HCL  TECHNICIAN COMMENTS Comments added by technician: O2 increased due to low sats  RESPIRATORY PARAMETERS Optimal PAP Pressure (cm): 18 AHI at Optimal Pressure (/hr): 0.0 Overall Minimal O2 (%): 0.00 Supine % at Optimal Pressure (%): 100 Minimal O2 at Optimal Pressure (%): 0.0   SLEEP ARCHITECTURE The study was initiated at 10:28:26 PM and ended at 5:11:06 AM.  Sleep onset time was 4.9 minutes and the sleep efficiency was 83.9%. The total sleep time was 337.8 minutes.  The patient spent 9.03% of the night in stage N1 sleep, 80.68% in stage N2 sleep, 0.00% in stage N3 and 10.30% in REM.Stage REM latency was 61.0 minutes  Wake after sleep onset was 60.0. Alpha intrusion was absent. Supine sleep was 100.00%.  CARDIAC DATA The 2 lead EKG demonstrated atrial fibrillation.  The mean heart rate was 61.21 beats per minute. Other EKG findings include: None.  LEG MOVEMENT DATA The total Periodic Limb Movements of Sleep (PLMS) were 0. The PLMS index was 0.00. A PLMS index of <15 is considered normal in adults.  IMPRESSIONS - The patient was titrated up to 18 cm water pressure.  At 18 CWP, AHI was 0.. - Central sleep apnea was not noted during this titration (CAI = 2.1/h). - Severe oxygen desaturations to a nadir of 73% at 10 cm water pressure.  - No snoring was audible during this study. - Atrial fibrillation - Clinically significant periodic limb movements were not noted during this study. Arousals associated with PLMs were rare.  DIAGNOSIS - Obstructive Sleep Apnea (327.23 [G47.33 ICD-10]) - Nocturnal Hypoxemia (327.26 [G47.36 ICD-10])  RECOMMENDATIONS - Recommend an initial trial of CPAP therapy with EPR of 3 at 18 cm H2O with heated humidification.  A Large size Philips Respironics Full Face Mask Dreamwear mask was uswd for the titration. - The patient arrived on 2 L of oxygen.  Oxygen was applied and increased to 4 L during the study due to persistent hypoxemia. - Avoid alcohol, sedatives and other CNS depressants that may worsen sleep apnea and disrupt normal sleep architecture. - Sleep hygiene should be reviewed to assess factors that may improve sleep quality. - Weight management and regular exercise should be initiated. - Recommend a download be obtained in 30 days and the patient return to sleep clinic for re-evaluation after 4 weeks of therapy   [Electronically signed] 12/25/2017 07:46 PM  Shelva Majestic MD, [place], American Board of Sleep Medicine   NPI: [id number] [place] PH: [phone]   FX: [phone] ACCREDITED BY THE AMERICAN ACADEMY OF SLEEP MEDICINE

## 2017-12-26 ENCOUNTER — Telehealth: Payer: Self-pay | Admitting: *Deleted

## 2017-12-26 NOTE — Telephone Encounter (Signed)
Patient notified CPAP referral has been sent to [facility] to set up for machine and supplies.

## 2017-12-26 NOTE — Telephone Encounter (Signed)
-----   Message from Troy Sine, MD sent at 12/25/2017  7:52 PM EST ----- Albert James  Please notify the patient the results of his CPAP titration is upper DME company for CPAP initiation.  Arrange for sleep clinic for evaluation.

## 2017-12-27 ENCOUNTER — Encounter (HOSPITAL_BASED_OUTPATIENT_CLINIC_OR_DEPARTMENT_OTHER): Payer: Medicare Other | Attending: Internal Medicine

## 2017-12-27 DIAGNOSIS — J449 Chronic obstructive pulmonary disease, unspecified: Secondary | ICD-10-CM | POA: Insufficient documentation

## 2017-12-27 DIAGNOSIS — L97112 Non-pressure chronic ulcer of right thigh with fat layer exposed: Secondary | ICD-10-CM | POA: Diagnosis not present

## 2017-12-27 DIAGNOSIS — Z923 Personal history of irradiation: Secondary | ICD-10-CM | POA: Insufficient documentation

## 2017-12-27 DIAGNOSIS — L97212 Non-pressure chronic ulcer of right calf with fat layer exposed: Secondary | ICD-10-CM | POA: Diagnosis not present

## 2017-12-27 DIAGNOSIS — I272 Pulmonary hypertension, unspecified: Secondary | ICD-10-CM | POA: Diagnosis not present

## 2017-12-27 DIAGNOSIS — Z87891 Personal history of nicotine dependence: Secondary | ICD-10-CM | POA: Diagnosis not present

## 2017-12-27 DIAGNOSIS — Z9981 Dependence on supplemental oxygen: Secondary | ICD-10-CM | POA: Insufficient documentation

## 2017-12-27 DIAGNOSIS — G4733 Obstructive sleep apnea (adult) (pediatric): Secondary | ICD-10-CM | POA: Insufficient documentation

## 2017-12-27 DIAGNOSIS — Z9221 Personal history of antineoplastic chemotherapy: Secondary | ICD-10-CM | POA: Diagnosis not present

## 2017-12-27 DIAGNOSIS — Z8572 Personal history of non-Hodgkin lymphomas: Secondary | ICD-10-CM | POA: Diagnosis not present

## 2017-12-27 DIAGNOSIS — I890 Lymphedema, not elsewhere classified: Secondary | ICD-10-CM | POA: Diagnosis not present

## 2017-12-27 DIAGNOSIS — S71101A Unspecified open wound, right thigh, initial encounter: Secondary | ICD-10-CM | POA: Diagnosis not present

## 2017-12-31 DIAGNOSIS — I272 Pulmonary hypertension, unspecified: Secondary | ICD-10-CM | POA: Diagnosis not present

## 2017-12-31 DIAGNOSIS — I5032 Chronic diastolic (congestive) heart failure: Secondary | ICD-10-CM | POA: Diagnosis not present

## 2017-12-31 DIAGNOSIS — M2550 Pain in unspecified joint: Secondary | ICD-10-CM | POA: Diagnosis not present

## 2017-12-31 DIAGNOSIS — R0902 Hypoxemia: Secondary | ICD-10-CM | POA: Diagnosis not present

## 2018-01-03 ENCOUNTER — Ambulatory Visit (HOSPITAL_COMMUNITY)
Admission: RE | Admit: 2018-01-03 | Discharge: 2018-01-03 | Disposition: A | Discharge status: Home / Self Care | Payer: Medicare Other | Source: Ambulatory Visit | Attending: Cardiology | Admitting: Cardiology

## 2018-01-03 ENCOUNTER — Encounter (HOSPITAL_COMMUNITY): Payer: Self-pay

## 2018-01-03 ENCOUNTER — Other Ambulatory Visit: Payer: Self-pay | Admitting: *Deleted

## 2018-01-03 VITALS — BP 110/86 | HR 76 | Wt >= 6400 oz

## 2018-01-03 DIAGNOSIS — Z7951 Long term (current) use of inhaled steroids: Secondary | ICD-10-CM | POA: Diagnosis not present

## 2018-01-03 DIAGNOSIS — G4733 Obstructive sleep apnea (adult) (pediatric): Secondary | ICD-10-CM | POA: Diagnosis not present

## 2018-01-03 DIAGNOSIS — Z79899 Other long term (current) drug therapy: Secondary | ICD-10-CM | POA: Insufficient documentation

## 2018-01-03 DIAGNOSIS — J449 Chronic obstructive pulmonary disease, unspecified: Secondary | ICD-10-CM | POA: Diagnosis not present

## 2018-01-03 DIAGNOSIS — Z6841 Body Mass Index (BMI) 40.0 and over, adult: Secondary | ICD-10-CM | POA: Diagnosis not present

## 2018-01-03 DIAGNOSIS — F329 Major depressive disorder, single episode, unspecified: Secondary | ICD-10-CM | POA: Diagnosis not present

## 2018-01-03 DIAGNOSIS — E662 Morbid (severe) obesity with alveolar hypoventilation: Secondary | ICD-10-CM | POA: Insufficient documentation

## 2018-01-03 DIAGNOSIS — Z8572 Personal history of non-Hodgkin lymphomas: Secondary | ICD-10-CM | POA: Diagnosis not present

## 2018-01-03 DIAGNOSIS — E785 Hyperlipidemia, unspecified: Secondary | ICD-10-CM | POA: Insufficient documentation

## 2018-01-03 DIAGNOSIS — M179 Osteoarthritis of knee, unspecified: Secondary | ICD-10-CM | POA: Insufficient documentation

## 2018-01-03 DIAGNOSIS — I272 Pulmonary hypertension, unspecified: Secondary | ICD-10-CM | POA: Insufficient documentation

## 2018-01-03 DIAGNOSIS — K7460 Unspecified cirrhosis of liver: Secondary | ICD-10-CM | POA: Insufficient documentation

## 2018-01-03 DIAGNOSIS — Z79890 Hormone replacement therapy (postmenopausal): Secondary | ICD-10-CM | POA: Insufficient documentation

## 2018-01-03 DIAGNOSIS — L97119 Non-pressure chronic ulcer of right thigh with unspecified severity: Secondary | ICD-10-CM | POA: Diagnosis not present

## 2018-01-03 DIAGNOSIS — I872 Venous insufficiency (chronic) (peripheral): Secondary | ICD-10-CM | POA: Insufficient documentation

## 2018-01-03 DIAGNOSIS — Z87891 Personal history of nicotine dependence: Secondary | ICD-10-CM | POA: Insufficient documentation

## 2018-01-03 DIAGNOSIS — I482 Chronic atrial fibrillation: Secondary | ICD-10-CM | POA: Insufficient documentation

## 2018-01-03 DIAGNOSIS — I5032 Chronic diastolic (congestive) heart failure: Secondary | ICD-10-CM | POA: Insufficient documentation

## 2018-01-03 DIAGNOSIS — I481 Persistent atrial fibrillation: Secondary | ICD-10-CM | POA: Diagnosis not present

## 2018-01-03 DIAGNOSIS — Z9981 Dependence on supplemental oxygen: Secondary | ICD-10-CM | POA: Insufficient documentation

## 2018-01-03 DIAGNOSIS — Z7901 Long term (current) use of anticoagulants: Secondary | ICD-10-CM | POA: Diagnosis not present

## 2018-01-03 DIAGNOSIS — I4819 Other persistent atrial fibrillation: Secondary | ICD-10-CM

## 2018-01-03 LAB — BASIC METABOLIC PANEL
ANION GAP: 10 (ref 5–15)
BUN: 21 mg/dL — ABNORMAL HIGH (ref 6–20)
CALCIUM: 8.8 mg/dL — AB (ref 8.9–10.3)
CO2: 29 mmol/L (ref 22–32)
Chloride: 97 mmol/L — ABNORMAL LOW (ref 101–111)
Creatinine, Ser: 1.44 mg/dL — ABNORMAL HIGH (ref 0.61–1.24)
GFR, EST AFRICAN AMERICAN: 58 mL/min — AB (ref >60)
GFR, EST NON AFRICAN AMERICAN: 50 mL/min — AB (ref >60)
Glucose, Bld: 126 mg/dL — ABNORMAL HIGH (ref 65–99)
Potassium: 4.1 mmol/L (ref 3.5–5.1)
Sodium: 136 mmol/L (ref 135–145)

## 2018-01-03 MED ORDER — OXYCODONE HCL ER 40 MG PO T12A: 40.0000 mg | EXTENDED_RELEASE_TABLET | Freq: Two times a day (BID) | ORAL | 0 refills | Status: DC

## 2018-01-03 MED ORDER — OXYCODONE HCL 20 MG PO TABS: 20.0000 mg | ORAL_TABLET | Freq: Three times a day (TID) | ORAL | 0 refills | Status: DC | PRN

## 2018-01-03 NOTE — Patient Instructions (Signed)
Routine lab work today. Will notify you of abnormal results, otherwise no news is good news!  Follow up 6-8 weeks with Dr. Aundra Dubin.  _____________________________________________________ Albert James Code: 1100  Take all medication as prescribed the day of your appointment. Bring all medications with you to your appointment.  Do the following things EVERYDAY: 1) Weigh yourself in the morning before breakfast. Write it down and keep it in a log. 2) Take your medicines as prescribed 3) Eat low salt foods-Limit salt (sodium) to 2000 mg per day.  4) Stay as active as you can everyday 5) Limit all fluids for the day to less than 2 liters

## 2018-01-03 NOTE — Progress Notes (Signed)
Advanced Heart Failure Clinic Note   PCP: Dr. Lynnda Child Cardiology: Dr. Martinique HF Cardiology: Dr. Lynelle Doctor is a 62 y.o. male with permanent atrial fibrillation, NASH with cirrhosis, suspected OHS/OSA, and chronic diastolic CHF/RV failure was referred by Dr. Martinique for evaluation of CHF.   Patient has had atrial fibrillation since at least 2015. He has had problems with peripheral edema x years. He has also had some degree of dyspnea for a long time.  However, towards the end of 2018, dyspnea became markedly worse.  At last appointment, I started him on home oxygen.   Echo was done in 11/18 showing EF 50-55%, septal flattening, severe RV dilation, PASP 77 mmHg. LHC/RHC in 1/19 showed no CAD, evidence for RV failure and severe pulmonary hypertension.  PFTs in 1/19 showed severe COPD.  V/Q scan in 1/19 showed no evidence for chronic PEs.  Sleep study showed severe OSA.    He presents today for regular follow up. Since last visit has seen wound care, and has been to second appointment to get his CPAP. Working through [facility] at this time. Down another 6 lbs. Only walks about 10-15 feet at a time. Starting PT this week.  Non-healing ulcer on right thigh now being followed by wound clinic. Taking all medication as directed. Hasn't needed to take any extra torsemide.   Labs (10/18): LDL 101, TSH normal, K 3.9, creatinine 1.2.  Labs (1/19): K 4, creatinine 1.27  Review of systems complete and found to be negative unless listed in HPI.    PMH: 1. Non-hodgkins lymphoma: follicular small cell lymphoma.  S/p treatment, in clinical remission.  2. Cirrhosis: Suspect NASH.  - Abdominal US (10/18): Suggestive of cirrhosis.  3. Hyperlipidemia.  4. Obesity 5. Atrial fibrillation: Suspected permanent. He appears to have been in atrial fibrillation at least back to 2015.  6. Depression 7. Venous insufficiency 8. OHS/OSA: Sleep study with severe OSA.  9. H/o vertebral  osteomyelitis.  10. OA knees 11. Chronic diastolic CHF/RV failure: Echo (11/18) with EF 50-55%, septal flattening, RV severely dilated with normal systolic function, PASP 77 mmHg, moderate TR.  12. Pulmonary hypertension: Suspect due to OHS/OSA and COPD.  - RHC (1/19): mean RA 20, PA 87/37 mean 52, mean PCWP 16, CI 2.12, PVR 5.6 WU.  - V/Q scan 1/19 with no evidence for chronic PE.                                                                                                                 13. Coronary angiography (1/19): No significant CAD.  14. COPD: PFTs in 1/19 suggested severe COPD.   Social History   Socioeconomic History  . Marital status: Divorced    Spouse name: Not on file  . Number of children: 2  . Years of education: Not on file  . Highest education level: Not on file  Social Needs  . Financial resource strain: Not on file  . Food insecurity - worry: Not on file  .  Food insecurity - inability: Not on file  . Transportation needs - medical: Not on file  . Transportation needs - non-medical: Not on file  Occupational History  . Occupation: disabled  Tobacco Use  . Smoking status: Former Smoker    Packs/day: 0.50    Years: 8.00    Pack years: 4.00    Types: Cigarettes    Start date: 09/26/1966    Last attempt to quit: 06/26/1989    Years since quitting: 28.5  . Smokeless tobacco: Never Used  . Tobacco comment: quit 38 years ago  Substance and Sexual Activity  . Alcohol use: No    Alcohol/week: 0.0 oz  . Drug use: No  . Sexual activity: Not on file  Other Topics Concern  . Not on file  Social History Narrative  . Not on file   Family History  Problem Relation Age of Onset  . Cancer Father        pacemaker  . Bradycardia Father    Current Outpatient Medications  Medication Sig Dispense Refill  . apixaban (ELIQUIS) 5 MG TABS tablet Take 5 mg by mouth 2 (two) times daily.    . cycloSPORINE (RESTASIS) 0.05 % ophthalmic emulsion Place 1 drop into both eyes 2  (two) times daily as needed (for eye irritation/dryness).    Marland Kitchen diclofenac sodium (VOLTAREN) 1 % GEL Apply 1 g topically 4 (four) times daily as needed (for knee pain.).    Marland Kitchen etodolac (LODINE) 400 MG tablet Take 400 mg by mouth 2 (two) times daily.    . fluticasone (FLONASE) 50 MCG/ACT nasal spray Place 2 sprays into both nostrils at bedtime.     Marland Kitchen lactulose (CHRONULAC) 10 GM/15ML solution Take 10 g by mouth daily.     Marland Kitchen levothyroxine (SYNTHROID, LEVOTHROID) 125 MCG tablet Take 125 mcg by mouth daily before breakfast.   0  . nadolol (CORGARD) 20 MG tablet Take 20 mg by mouth daily.     Marland Kitchen NARCAN 4 MG/0.1ML LIQD nasal spray kit Place 1 spray into the nose as needed (For accidental opoid overdose).   0  . omega-3 acid ethyl esters (LOVAZA) 1 g capsule Take 2 g by mouth 2 (two) times daily.   0  . oxyCODONE (OXYCONTIN) 40 mg 12 hr tablet Take 1 tablet (40 mg total) by mouth every 12 (twelve) hours. Do not fill until 07/25/2017 60 tablet 0  . Oxycodone HCl 20 MG TABS Take 1 tablet (20 mg total) by mouth every 8 (eight) hours as needed. 90 tablet 0  . spironolactone (ALDACTONE) 100 MG tablet Take 100 mg by mouth 2 (two) times daily.      Marland Kitchen torsemide (DEMADEX) 20 MG tablet Take 2 tablets (40 mg) in the morning and 1 tablet (20 mg) in the afternoon.     No current facility-administered medications for this encounter.    Vitals:   01/03/18 1408  BP: 110/86  Pulse: 76  SpO2: 90%  Weight: (!) 446 lb 12.8 oz (202.7 kg)     Wt Readings from Last 3 Encounters:  01/03/18 (!) 446 lb 12.8 oz (202.7 kg)  12/22/17 (!) 450 lb (204.1 kg)  12/20/17 (!) 451 lb 12.8 oz (204.9 kg)   Physical Exam General: NAD, Obese.  HEENT: Normal Neck: Supple. JVP difficult. Carotids 2+ bilat; no bruits. No thyromegaly or nodule noted. Cor: PMI nondisplaced. RRR, No M/G/R noted Lungs: CTAB, normal effort. Abdomen: Soft, non-tender, non-distended, no HSM. No bruits or masses. +BS  Extremities: No cyanosis  or clubbing.  2-3+ edema with significant lymph edema into thighs. R thigh wrapped. Chronic venous stasis changes.  Neuro: Alert & orientedx3, cranial nerves grossly intact. moves all 4 extremities w/o difficulty. Affect pleasant   1. Chronic diastolic CHF with prominent RV failure: - Echo most remarkable for pulmonary hypertension and RV failure (11/18).  -Suspect RV failure is triggered by pulmonary hypertension.  Underlying OHS/OSA and COPD have likely caused pulmonary hypertension/RV failure. - NYHA III-IIIb. Severely limited by body habitus - Volume status OK centrally. Peripheral edema very difficult to judge with his lymph edema.  - Continue torsemide  40 mg q am and 20 mq q pm. Can take additional 20 mg in pm as needed.  - Continue UNNA boots.  - Appreciate wound clinic care.  - Reinforced fluid restriction to < 2 L daily, sodium restriction to less than 2000 mg daily, and the importance of daily weights.   2. Pulmonary hypertension: Severe by echo and RHC (pulmonary arterial hypertension).  Suspect group 3 disease due to COPD (severe obstruction on PFTs) and OSA/OHS.  He is on home oxygen but has severe OSA that is not currently treated.  V/Q scan not suggestive of chronic PEs.  - Continue chronic O2.  - Needs to start CPAP.  - Probably would not benefit significantly from pulmonary vasodilators.  3. OHS/OSA:  - Continue chronic O2.  - Needs CPAP.  4. Cirrhosis: Suspected NASH.  Denies ETOH. Cannot totally rule out component of portopulmonary hypertension. He is on spironolactone at high dose.   - Repeat BMET today.  5. Atrial fibrillation: Chronic, suspect permanent (present since at least 2015). - Rate controlled on current meds.   - Continue Eliquis 5 mg BID. Denies bleeding. 6. Super Morbid Obesity - Encouraged to limit portion size and be as active as possible. Starts PT this week.   Meds as above. Volume status relatively stable.  RTC 6-8 weeks. Sooner with symptoms.   Shirley Friar, PA-C  01/03/2018

## 2018-01-05 DIAGNOSIS — S71101A Unspecified open wound, right thigh, initial encounter: Secondary | ICD-10-CM | POA: Diagnosis not present

## 2018-01-05 DIAGNOSIS — Z8572 Personal history of non-Hodgkin lymphomas: Secondary | ICD-10-CM | POA: Diagnosis not present

## 2018-01-05 DIAGNOSIS — G4733 Obstructive sleep apnea (adult) (pediatric): Secondary | ICD-10-CM | POA: Diagnosis not present

## 2018-01-05 DIAGNOSIS — L97212 Non-pressure chronic ulcer of right calf with fat layer exposed: Secondary | ICD-10-CM | POA: Diagnosis not present

## 2018-01-05 DIAGNOSIS — Z9221 Personal history of antineoplastic chemotherapy: Secondary | ICD-10-CM | POA: Diagnosis not present

## 2018-01-05 DIAGNOSIS — Z87891 Personal history of nicotine dependence: Secondary | ICD-10-CM | POA: Diagnosis not present

## 2018-01-05 DIAGNOSIS — Z923 Personal history of irradiation: Secondary | ICD-10-CM | POA: Diagnosis not present

## 2018-01-05 DIAGNOSIS — I890 Lymphedema, not elsewhere classified: Secondary | ICD-10-CM | POA: Diagnosis not present

## 2018-01-05 DIAGNOSIS — I272 Pulmonary hypertension, unspecified: Secondary | ICD-10-CM | POA: Diagnosis not present

## 2018-01-05 DIAGNOSIS — L97112 Non-pressure chronic ulcer of right thigh with fat layer exposed: Secondary | ICD-10-CM | POA: Diagnosis not present

## 2018-01-05 DIAGNOSIS — Z9981 Dependence on supplemental oxygen: Secondary | ICD-10-CM | POA: Diagnosis not present

## 2018-01-05 DIAGNOSIS — J449 Chronic obstructive pulmonary disease, unspecified: Secondary | ICD-10-CM | POA: Diagnosis not present

## 2018-01-13 ENCOUNTER — Encounter (HOSPITAL_COMMUNITY): Payer: Self-pay

## 2018-01-13 ENCOUNTER — Encounter (HOSPITAL_BASED_OUTPATIENT_CLINIC_OR_DEPARTMENT_OTHER): Payer: Medicare Other | Attending: Internal Medicine

## 2018-01-13 ENCOUNTER — Other Ambulatory Visit (HOSPITAL_COMMUNITY): Payer: Self-pay

## 2018-01-13 DIAGNOSIS — S81801A Unspecified open wound, right lower leg, initial encounter: Secondary | ICD-10-CM | POA: Diagnosis not present

## 2018-01-13 DIAGNOSIS — I890 Lymphedema, not elsewhere classified: Secondary | ICD-10-CM | POA: Diagnosis not present

## 2018-01-13 DIAGNOSIS — L98492 Non-pressure chronic ulcer of skin of other sites with fat layer exposed: Secondary | ICD-10-CM | POA: Diagnosis not present

## 2018-01-13 DIAGNOSIS — I509 Heart failure, unspecified: Secondary | ICD-10-CM | POA: Insufficient documentation

## 2018-01-13 DIAGNOSIS — J449 Chronic obstructive pulmonary disease, unspecified: Secondary | ICD-10-CM | POA: Diagnosis not present

## 2018-01-13 DIAGNOSIS — Z9221 Personal history of antineoplastic chemotherapy: Secondary | ICD-10-CM | POA: Insufficient documentation

## 2018-01-13 DIAGNOSIS — I739 Peripheral vascular disease, unspecified: Secondary | ICD-10-CM | POA: Insufficient documentation

## 2018-01-13 DIAGNOSIS — G4730 Sleep apnea, unspecified: Secondary | ICD-10-CM | POA: Insufficient documentation

## 2018-01-13 DIAGNOSIS — Z923 Personal history of irradiation: Secondary | ICD-10-CM | POA: Diagnosis not present

## 2018-01-13 DIAGNOSIS — S71101A Unspecified open wound, right thigh, initial encounter: Secondary | ICD-10-CM | POA: Diagnosis not present

## 2018-01-13 DIAGNOSIS — L97111 Non-pressure chronic ulcer of right thigh limited to breakdown of skin: Secondary | ICD-10-CM | POA: Insufficient documentation

## 2018-01-13 MED ORDER — ROLLATOR MISC: 0 refills | Status: DC

## 2018-01-18 DIAGNOSIS — G4733 Obstructive sleep apnea (adult) (pediatric): Secondary | ICD-10-CM | POA: Diagnosis not present

## 2018-01-19 DIAGNOSIS — L97111 Non-pressure chronic ulcer of right thigh limited to breakdown of skin: Secondary | ICD-10-CM | POA: Diagnosis not present

## 2018-01-19 DIAGNOSIS — S81801A Unspecified open wound, right lower leg, initial encounter: Secondary | ICD-10-CM | POA: Diagnosis not present

## 2018-01-19 DIAGNOSIS — I890 Lymphedema, not elsewhere classified: Secondary | ICD-10-CM | POA: Diagnosis not present

## 2018-01-19 DIAGNOSIS — S71101A Unspecified open wound, right thigh, initial encounter: Secondary | ICD-10-CM | POA: Diagnosis not present

## 2018-01-20 DIAGNOSIS — I5032 Chronic diastolic (congestive) heart failure: Secondary | ICD-10-CM | POA: Diagnosis not present

## 2018-01-20 DIAGNOSIS — I872 Venous insufficiency (chronic) (peripheral): Secondary | ICD-10-CM | POA: Diagnosis not present

## 2018-01-24 ENCOUNTER — Ambulatory Visit (INDEPENDENT_AMBULATORY_CARE_PROVIDER_SITE_OTHER): Payer: Medicare Other | Admitting: Podiatry

## 2018-01-24 ENCOUNTER — Encounter: Payer: Self-pay | Admitting: Podiatry

## 2018-01-24 DIAGNOSIS — M79676 Pain in unspecified toe(s): Secondary | ICD-10-CM

## 2018-01-24 DIAGNOSIS — D689 Coagulation defect, unspecified: Secondary | ICD-10-CM | POA: Diagnosis not present

## 2018-01-24 DIAGNOSIS — B351 Tinea unguium: Secondary | ICD-10-CM | POA: Diagnosis not present

## 2018-01-24 NOTE — Progress Notes (Signed)
Patient ID: JAK HAGGAR, male   DOB: Feb 06, 1956, 62 y.o.   MRN: [id number] Complaint:  Visit Type: Patient returns to my office for continued preventative foot care services. Complaint: Patient states" my nails have grown long and thick and become painful to walk and wear shoes" Patient is obese and unable to get to his feet. The patient presents for preventative foot care services. No changes to ROS.  Patient is on eliquiss.  Podiatric Exam: Vascular: dorsalis pedis and posterior tibial pulses are not  palpable bilateral. Capillary return is immediate. Temperature gradient is WNL. Skin turgor WNL  Sensorium: Normal Semmes Weinstein monofilament test. Normal tactile sensation bilaterally. Nail Exam: Pt has thick disfigured discolored nails with subungual debris noted bilateral entire nail hallux through fifth toenails Ulcer Exam: There is no evidence of ulcer or pre-ulcerative changes or infection. Orthopedic Exam: Muscle tone and strength are WNL. No limitations in general ROM. No crepitus or effusions noted. Foot type and digits show no abnormalities. Bony prominences are unremarkable. Severe swelling in legs and feet. Skin: No Porokeratosis. No infection or ulcers  Diagnosis:  Onychomycosis, , Pain in right toe, pain in left toes  Treatment & Plan Procedures and Treatment: Consent by patient was obtained for treatment procedures. The patient understood the discussion of treatment and procedures well. All questions were answered thoroughly reviewed. Debridement of mycotic and hypertrophic toenails, 1 through 5 bilateral and clearing of subungual debris. No ulceration, no infection noted.  Return Visit-Office Procedure: Patient instructed to return to the office for a follow up visit 10 weeks  for continued evaluation and treatment.    Gardiner Barefoot DPM

## 2018-01-27 ENCOUNTER — Encounter: Payer: Self-pay | Admitting: Hematology & Oncology

## 2018-01-27 ENCOUNTER — Other Ambulatory Visit: Payer: Self-pay

## 2018-01-27 ENCOUNTER — Inpatient Hospital Stay: Payer: Medicare Other | Attending: Hematology & Oncology

## 2018-01-27 ENCOUNTER — Inpatient Hospital Stay (HOSPITAL_BASED_OUTPATIENT_CLINIC_OR_DEPARTMENT_OTHER): Payer: Medicare Other | Admitting: Hematology & Oncology

## 2018-01-27 VITALS — BP 93/75 | HR 72 | Temp 98.6°F | Resp 20 | Wt >= 6400 oz

## 2018-01-27 DIAGNOSIS — Z8572 Personal history of non-Hodgkin lymphomas: Secondary | ICD-10-CM | POA: Insufficient documentation

## 2018-01-27 DIAGNOSIS — Z7901 Long term (current) use of anticoagulants: Secondary | ICD-10-CM

## 2018-01-27 DIAGNOSIS — I4891 Unspecified atrial fibrillation: Secondary | ICD-10-CM | POA: Diagnosis not present

## 2018-01-27 DIAGNOSIS — M5134 Other intervertebral disc degeneration, thoracic region: Secondary | ICD-10-CM | POA: Insufficient documentation

## 2018-01-27 DIAGNOSIS — I4819 Other persistent atrial fibrillation: Secondary | ICD-10-CM

## 2018-01-27 DIAGNOSIS — Z9981 Dependence on supplemental oxygen: Secondary | ICD-10-CM

## 2018-01-27 DIAGNOSIS — I890 Lymphedema, not elsewhere classified: Secondary | ICD-10-CM

## 2018-01-27 LAB — CMP (CANCER CENTER ONLY)
ALBUMIN: 3.6 g/dL (ref 3.5–5.0)
ALK PHOS: 88 U/L — AB (ref 26–84)
ALT: 30 U/L (ref 10–47)
AST: 29 U/L (ref 11–38)
Anion gap: 10 (ref 5–15)
BUN: 24 mg/dL — AB (ref 7–22)
CALCIUM: 9.4 mg/dL (ref 8.0–10.3)
CO2: 30 mmol/L (ref 18–33)
CREATININE: 1.5 mg/dL — AB (ref 0.60–1.20)
Chloride: 100 mmol/L (ref 98–108)
Glucose, Bld: 112 mg/dL (ref 73–118)
POTASSIUM: 4.3 mmol/L (ref 3.3–4.7)
Sodium: 140 mmol/L (ref 128–145)
TOTAL PROTEIN: 6.3 g/dL — AB (ref 6.4–8.1)
Total Bilirubin: 1.3 mg/dL (ref 0.2–1.6)

## 2018-01-27 LAB — CBC WITH DIFFERENTIAL (CANCER CENTER ONLY)
BASOS ABS: 0 10*3/uL (ref 0.0–0.1)
BASOS PCT: 0 %
EOS ABS: 0 10*3/uL (ref 0.0–0.5)
EOS PCT: 1 %
HCT: 44.2 % (ref 38.7–49.9)
Hemoglobin: 13.8 g/dL (ref 13.0–17.1)
Lymphocytes Relative: 17 %
Lymphs Abs: 1 10*3/uL (ref 0.9–3.3)
MCH: 28 pg (ref 28.0–33.4)
MCHC: 31.2 g/dL — ABNORMAL LOW (ref 32.0–35.9)
MCV: 89.7 fL (ref 82.0–98.0)
Monocytes Absolute: 0.7 10*3/uL (ref 0.1–0.9)
Monocytes Relative: 11 %
Neutro Abs: 4.2 10*3/uL (ref 1.5–6.5)
Neutrophils Relative %: 71 %
PLATELETS: 118 10*3/uL — AB (ref 145–400)
RBC: 4.93 MIL/uL (ref 4.20–5.70)
RDW: 20.9 % — ABNORMAL HIGH (ref 11.1–15.7)
WBC: 5.9 10*3/uL (ref 4.0–10.0)

## 2018-01-27 LAB — SAVE SMEAR

## 2018-01-27 LAB — LACTATE DEHYDROGENASE: LDH: 249 U/L — AB (ref 125–245)

## 2018-01-27 NOTE — Progress Notes (Signed)
Hematology and Oncology Follow Up Visit  Albert James [id number] Aug 06, 1956 62 y.o. 01/27/2018   Principle Diagnosis:  1. Follicular small cell non-Hodgkin lymphoma, clinical remission. 2. Morbid obesity. 3. Lymphedema of his legs. 4. Vertebral osteomyelitis.   Current Therapy:    Observation     Interim History:  Albert James is back for followup. We see him every 6 months.  He now comes in with supplemental oxygen.  He apparently has congestive heart failure.  He has atrial fibrillation.  He does see cardiology.  He is also on Eliquis now.  He has had no bleeding.  He has had past tobacco use.  He also may be an issue with underlying COPD.  He has  lost quite a bit of weight.  This has helped him.  I am glad that he is lost weight.  Since we last saw him, he is lost about 50 pounds.   He does have bad degenerative disease in the spine.  He had a history of vertebral osteomyelitis.  He is on OxyContin and oxycodone.  These have helped him tremendously.  He is trying to watch what he eats.  Again he wants to lose more weight.  He really wants to come off the oxygen.  Overall, his performance status is ECOG 3.   Medications:  Current Outpatient Medications:  .  apixaban (ELIQUIS) 5 MG TABS tablet, Take 5 mg by mouth 2 (two) times daily., Disp: , Rfl:  .  cycloSPORINE (RESTASIS) 0.05 % ophthalmic emulsion, Place 1 drop into both eyes 2 (two) times daily as needed (for eye irritation/dryness)., Disp: , Rfl:  .  diclofenac sodium (VOLTAREN) 1 % GEL, Apply 1 g topically 4 (four) times daily as needed (for knee pain.)., Disp: , Rfl:  .  etodolac (LODINE) 400 MG tablet, Take 400 mg by mouth 2 (two) times daily., Disp: , Rfl:  .  fluticasone (FLONASE) 50 MCG/ACT nasal spray, Place 2 sprays into both nostrils at bedtime. , Disp: , Rfl:  .  lactulose (CHRONULAC) 10 GM/15ML solution, Take 10 g by mouth daily. , Disp: , Rfl:  .  levothyroxine (SYNTHROID, LEVOTHROID) 125 MCG  tablet, Take 125 mcg by mouth daily before breakfast. , Disp: , Rfl: 0 .  Misc. Devices (ROLLATOR) MISC, Use daily with walking, Disp: 1 each, Rfl: 0 .  nadolol (CORGARD) 20 MG tablet, Take 20 mg by mouth daily. , Disp: , Rfl:  .  NARCAN 4 MG/0.1ML LIQD nasal spray kit, Place 1 spray into the nose as needed (For accidental opoid overdose). , Disp: , Rfl: 0 .  omega-3 acid ethyl esters (LOVAZA) 1 g capsule, Take 2 g by mouth 2 (two) times daily. , Disp: , Rfl: 0 .  oxyCODONE (OXYCONTIN) 40 mg 12 hr tablet, Take 1 tablet (40 mg total) by mouth every 12 (twelve) hours. Do not fill until 07/25/2017, Disp: 60 tablet, Rfl: 0 .  Oxycodone HCl 20 MG TABS, Take 1 tablet (20 mg total) by mouth every 8 (eight) hours as needed., Disp: 90 tablet, Rfl: 0 .  spironolactone (ALDACTONE) 100 MG tablet, Take 100 mg by mouth 2 (two) times daily.  , Disp: , Rfl:  .  torsemide (DEMADEX) 20 MG tablet, Take 2 tablets (40 mg) in the morning and 1 tablet (20 mg) in the afternoon., Disp: , Rfl:   Allergies:  Allergies  Allergen Reactions  . Vancomycin Other (See Comments)    "red man" syndrome (when in large quantity)  Past Medical History, Surgical history, Social history, and Family History were reviewed and updated.  Review of Systems: Review of Systems  Constitutional: Negative.   HENT: Negative.   Eyes: Negative.   Respiratory: Positive for shortness of breath.   Cardiovascular: Positive for palpitations and leg swelling.  Gastrointestinal: Positive for nausea.  Genitourinary: Negative.   Musculoskeletal: Positive for back pain.  Skin: Negative.   Neurological: Negative.   Endo/Heme/Allergies: Negative.   Psychiatric/Behavioral: Negative.      Physical Exam:  weight is 432 lb (196 kg) (abnormal). His oral temperature is 98.6 F (37 C). His blood pressure is 93/75 and his pulse is 72. His respiration is 20 and oxygen saturation is 93%.   Physical Exam  Lab Results  Component Value Date   WBC  5.9 01/27/2018   HGB 12.7 (L) 12/20/2017   HCT 44.2 01/27/2018   MCV 89.7 01/27/2018   PLT 118 (L) 01/27/2018     Chemistry      Component Value Date/Time   NA 140 01/27/2018 1301   NA 140 07/29/2017 1311   NA 142 01/26/2017 1309   K 4.3 01/27/2018 1301   K 4.3 07/29/2017 1311   K 4.0 01/26/2017 1309   CL 100 01/27/2018 1301   CL 102 07/29/2017 1311   CL 100 01/26/2017 1309   CO2 30 01/27/2018 1301   CO2 30 (H) 07/29/2017 1311   CO2 30 01/26/2017 1309   BUN 24 (H) 01/27/2018 1301   BUN 15 07/29/2017 1311   BUN 15 01/26/2017 1309   CREATININE 1.50 (H) 01/27/2018 1301   CREATININE 1.04 07/29/2017 1311   CREATININE 1.1 01/26/2017 1309      Component Value Date/Time   CALCIUM 9.4 01/27/2018 1301   CALCIUM 9.4 07/29/2017 1311   CALCIUM 9.4 01/26/2017 1309   ALKPHOS 88 (H) 01/27/2018 1301   ALKPHOS 88 07/29/2017 1311   ALKPHOS 84 01/26/2017 1309   AST 29 01/27/2018 1301   ALT 30 01/27/2018 1301   ALT 23 01/26/2017 1309   BILITOT 1.3 01/27/2018 1301         Impression and Plan: Albert James is 62 year old white male. He has a remote history of follicular small cell non-Hodgkin's lymphoma. This really has not been an issue for him for probably 14 years.  Hopefully, he will continue to lose weight.  Hopefully, he will be able to get off the oxygen.  I will see him back in 6 months.  Volanda Napoleon, MD 3/15/20193:58 PM

## 2018-01-28 DIAGNOSIS — I5032 Chronic diastolic (congestive) heart failure: Secondary | ICD-10-CM | POA: Diagnosis not present

## 2018-01-28 DIAGNOSIS — R0902 Hypoxemia: Secondary | ICD-10-CM | POA: Diagnosis not present

## 2018-01-28 DIAGNOSIS — I272 Pulmonary hypertension, unspecified: Secondary | ICD-10-CM | POA: Diagnosis not present

## 2018-01-28 DIAGNOSIS — M2550 Pain in unspecified joint: Secondary | ICD-10-CM | POA: Diagnosis not present

## 2018-01-30 ENCOUNTER — Other Ambulatory Visit: Payer: Self-pay | Admitting: *Deleted

## 2018-01-30 DIAGNOSIS — Z8572 Personal history of non-Hodgkin lymphomas: Secondary | ICD-10-CM

## 2018-01-30 MED ORDER — OXYCODONE HCL 20 MG PO TABS: 20.0000 mg | ORAL_TABLET | Freq: Three times a day (TID) | ORAL | 0 refills | Status: DC | PRN

## 2018-01-30 MED ORDER — OXYCODONE HCL ER 40 MG PO T12A: 40.0000 mg | EXTENDED_RELEASE_TABLET | Freq: Two times a day (BID) | ORAL | 0 refills | Status: DC

## 2018-01-31 ENCOUNTER — Other Ambulatory Visit: Payer: Self-pay

## 2018-01-31 ENCOUNTER — Ambulatory Visit (HOSPITAL_COMMUNITY)
Admission: RE | Admit: 2018-01-31 | Discharge: 2018-01-31 | Disposition: A | Discharge status: Home / Self Care | Payer: Medicare Other | Source: Ambulatory Visit | Attending: Cardiology | Admitting: Cardiology

## 2018-01-31 ENCOUNTER — Encounter (HOSPITAL_COMMUNITY): Payer: Self-pay | Admitting: Cardiology

## 2018-01-31 VITALS — BP 128/98 | HR 75 | Wt >= 6400 oz

## 2018-01-31 DIAGNOSIS — G4733 Obstructive sleep apnea (adult) (pediatric): Secondary | ICD-10-CM | POA: Diagnosis not present

## 2018-01-31 DIAGNOSIS — Z8249 Family history of ischemic heart disease and other diseases of the circulatory system: Secondary | ICD-10-CM | POA: Insufficient documentation

## 2018-01-31 DIAGNOSIS — I481 Persistent atrial fibrillation: Secondary | ICD-10-CM

## 2018-01-31 DIAGNOSIS — Z79899 Other long term (current) drug therapy: Secondary | ICD-10-CM | POA: Insufficient documentation

## 2018-01-31 DIAGNOSIS — I4891 Unspecified atrial fibrillation: Secondary | ICD-10-CM | POA: Insufficient documentation

## 2018-01-31 DIAGNOSIS — E785 Hyperlipidemia, unspecified: Secondary | ICD-10-CM | POA: Insufficient documentation

## 2018-01-31 DIAGNOSIS — Z87891 Personal history of nicotine dependence: Secondary | ICD-10-CM | POA: Diagnosis not present

## 2018-01-31 DIAGNOSIS — Z8572 Personal history of non-Hodgkin lymphomas: Secondary | ICD-10-CM | POA: Insufficient documentation

## 2018-01-31 DIAGNOSIS — Z7902 Long term (current) use of antithrombotics/antiplatelets: Secondary | ICD-10-CM | POA: Diagnosis not present

## 2018-01-31 DIAGNOSIS — I5032 Chronic diastolic (congestive) heart failure: Secondary | ICD-10-CM | POA: Diagnosis not present

## 2018-01-31 DIAGNOSIS — J449 Chronic obstructive pulmonary disease, unspecified: Secondary | ICD-10-CM | POA: Insufficient documentation

## 2018-01-31 DIAGNOSIS — R0602 Shortness of breath: Secondary | ICD-10-CM | POA: Diagnosis not present

## 2018-01-31 DIAGNOSIS — I2510 Atherosclerotic heart disease of native coronary artery without angina pectoris: Secondary | ICD-10-CM | POA: Diagnosis not present

## 2018-01-31 DIAGNOSIS — Z9981 Dependence on supplemental oxygen: Secondary | ICD-10-CM | POA: Insufficient documentation

## 2018-01-31 DIAGNOSIS — I4819 Other persistent atrial fibrillation: Secondary | ICD-10-CM

## 2018-01-31 DIAGNOSIS — I872 Venous insufficiency (chronic) (peripheral): Secondary | ICD-10-CM | POA: Insufficient documentation

## 2018-01-31 DIAGNOSIS — K7460 Unspecified cirrhosis of liver: Secondary | ICD-10-CM | POA: Insufficient documentation

## 2018-01-31 DIAGNOSIS — I272 Pulmonary hypertension, unspecified: Secondary | ICD-10-CM | POA: Insufficient documentation

## 2018-01-31 DIAGNOSIS — Z6841 Body Mass Index (BMI) 40.0 and over, adult: Secondary | ICD-10-CM | POA: Insufficient documentation

## 2018-01-31 DIAGNOSIS — Z809 Family history of malignant neoplasm, unspecified: Secondary | ICD-10-CM | POA: Diagnosis not present

## 2018-01-31 MED ORDER — TORSEMIDE 20 MG PO TABS: ORAL_TABLET | ORAL | Status: DC

## 2018-01-31 NOTE — Patient Instructions (Signed)
If your weight is 437 lb or greater take an extra 20 mg of Torsemide  Your physician recommends that you schedule a follow-up appointment in: 8-12 weeks

## 2018-01-31 NOTE — Progress Notes (Signed)
Advanced Heart Failure Clinic Note   PCP: Dr. Lynnda Child Cardiology: Dr. Martinique HF Cardiology: Dr. Lynelle Doctor is a 62 y.o. male with permanent atrial fibrillation, NASH with cirrhosis, suspected OHS/OSA, and chronic diastolic CHF/RV failure was referred by Dr. Martinique for evaluation of CHF.   Patient has had atrial fibrillation since at least 2015. He has had problems with peripheral edema x years. He has also had some degree of dyspnea for a long time.  However, towards the end of 2018, dyspnea became markedly worse.  At last appointment, I started him on home oxygen.   Echo was done in 11/18 showing EF 50-55%, septal flattening, severe RV dilation, PASP 77 mmHg. LHC/RHC in 1/19 showed no CAD, evidence for RV failure and severe pulmonary hypertension.  PFTs in 1/19 showed severe COPD.  V/Q scan in 1/19 showed no evidence for chronic PEs.  Sleep study showed severe OSA.    Today he returns for HF follow up. Overall feeling fine. SOB with exertion. Denies PND/Orthopnea. Eats out 1-2 times a week.  No fever or chills. Uses 2 liters oxygen chronically. Started using CPAP about 2 weeks ago. Weight at home 435-437 pounds. Walks with a walker. Does not walk out of the house much. Taking all medications. Followed by [facility] with RN once a week.   Labs (10/18): LDL 101, TSH normal, K 3.9, creatinine 1.2.  Labs (1/19): K 4, creatinine 1.27 Labs (3/15/019): K 4.3 Creatinine 1.5   Review of systems complete and found to be negative unless listed in HPI.    PMH: 1. Non-hodgkins lymphoma: follicular small cell lymphoma.  S/p treatment, in clinical remission.  2. Cirrhosis: Suspect NASH.  - Abdominal US (10/18): Suggestive of cirrhosis.  3. Hyperlipidemia.  4. Obesity 5. Atrial fibrillation: Suspected permanent. He appears to have been in atrial fibrillation at least back to 2015.  6. Depression 7. Venous insufficiency 8. OHS/OSA: Sleep study with severe OSA.  9. H/o vertebral  osteomyelitis.  10. OA knees 11. Chronic diastolic CHF/RV failure: Echo (11/18) with EF 50-55%, septal flattening, RV severely dilated with normal systolic function, PASP 77 mmHg, moderate TR.  12. Pulmonary hypertension: Suspect due to OHS/OSA and COPD.  - RHC (1/19): mean RA 20, PA 87/37 mean 52, mean PCWP 16, CI 2.12, PVR 5.6 WU.  - V/Q scan 1/19 with no evidence for chronic PE.                                                                                                                 13. Coronary angiography (1/19): No significant CAD.  14. COPD: PFTs in 1/19 suggested severe COPD.   Social History   Socioeconomic History  . Marital status: Divorced    Spouse name: Not on file  . Number of children: 2  . Years of education: Not on file  . Highest education level: Not on file  Social Needs  . Financial resource strain: Not on file  . Food insecurity - worry: Not on file  .  Food insecurity - inability: Not on file  . Transportation needs - medical: Not on file  . Transportation needs - non-medical: Not on file  Occupational History  . Occupation: disabled  Tobacco Use  . Smoking status: Former Smoker    Packs/day: 0.50    Years: 8.00    Pack years: 4.00    Types: Cigarettes    Start date: 09/26/1966    Last attempt to quit: 06/26/1989    Years since quitting: 28.6  . Smokeless tobacco: Never Used  . Tobacco comment: quit 38 years ago  Substance and Sexual Activity  . Alcohol use: No    Alcohol/week: 0.0 oz  . Drug use: No  . Sexual activity: Not on file  Other Topics Concern  . Not on file  Social History Narrative  . Not on file   Family History  Problem Relation Age of Onset  . Cancer Father        pacemaker  . Bradycardia Father    Current Outpatient Medications  Medication Sig Dispense Refill  . apixaban (ELIQUIS) 5 MG TABS tablet Take 5 mg by mouth 2 (two) times daily.    . cycloSPORINE (RESTASIS) 0.05 % ophthalmic emulsion Place 1 drop into both eyes 2  (two) times daily as needed (for eye irritation/dryness).    Marland Kitchen diclofenac sodium (VOLTAREN) 1 % GEL Apply 1 g topically 4 (four) times daily as needed (for knee pain.).    Marland Kitchen etodolac (LODINE) 400 MG tablet Take 400 mg by mouth 2 (two) times daily.    . fluticasone (FLONASE) 50 MCG/ACT nasal spray Place 2 sprays into both nostrils at bedtime.     Marland Kitchen lactulose (CHRONULAC) 10 GM/15ML solution Take 10 g by mouth daily.     Marland Kitchen levothyroxine (SYNTHROID, LEVOTHROID) 125 MCG tablet Take 125 mcg by mouth daily before breakfast.   0  . Misc. Devices (ROLLATOR) MISC Use daily with walking 1 each 0  . nadolol (CORGARD) 20 MG tablet Take 20 mg by mouth daily.     Marland Kitchen NARCAN 4 MG/0.1ML LIQD nasal spray kit Place 1 spray into the nose as needed (For accidental opoid overdose).   0  . omega-3 acid ethyl esters (LOVAZA) 1 g capsule Take 2 g by mouth 2 (two) times daily.   0  . oxyCODONE (OXYCONTIN) 40 mg 12 hr tablet Take 1 tablet (40 mg total) by mouth every 12 (twelve) hours. Do not fill until 07/25/2017 60 tablet 0  . Oxycodone HCl 20 MG TABS Take 1 tablet (20 mg total) by mouth every 8 (eight) hours as needed. 90 tablet 0  . spironolactone (ALDACTONE) 100 MG tablet Take 100 mg by mouth 2 (two) times daily.      Marland Kitchen torsemide (DEMADEX) 20 MG tablet Take 2 tablets (40 mg) in the morning and 1 tablet (20 mg) in the afternoon.     No current facility-administered medications for this encounter.    Vitals:   01/31/18 1406  BP: (!) 128/98  Pulse: 75  SpO2: 91%  Weight: (!) 434 lb 8 oz (197.1 kg)     Wt Readings from Last 3 Encounters:  01/31/18 (!) 434 lb 8 oz (197.1 kg)  01/27/18 (!) 432 lb (196 kg)  01/03/18 (!) 446 lb 12.8 oz (202.7 kg)   Physical Exam General:   Dyspneic walking in the clinic on 2 liters oxygen but resolved when sitting on the exam table.   HEENT: normal Neck: supple. JVP 6-7. Carotids 2+ bilat; no  bruits. No lymphadenopathy or thryomegaly appreciated. Cor: PMI nondisplaced. Regular rate  & rhythm. No rubs, gallops or murmurs. Lungs: decreased in the bases on 2 liters oxygen.  Abdomen: obese, soft, nontender, nondistended. No hepatosplenomegaly. No bruits or masses. Good bowel sounds. Extremities: no cyanosis, clubbing, rash, Rand LLE compression wraps.  Neuro: alert & orientedx3, cranial nerves grossly intact. moves all 4 extremities w/o difficulty. Affect pleasant  1. Chronic diastolic CHF with prominent RV failure: - Echo most remarkable for pulmonary hypertension and RV failure (11/18).  -Suspect RV failure is triggered by pulmonary hypertension.  Underlying OHS/OSA and COPD have likely caused pulmonary hypertension/RV failure. -NYHA IIIB. Severely limited by body habitus - Continue torsemide  40 mg q am and 20 mq q pm.  - Instructed to take an extra 20 mg torsemide for weight 437 or greater.   - Continue UNNA boots.  - Appreciate wound clinic care.  - discussed limiting take out food.  2. Pulmonary hypertension: Severe by echo and RHC (pulmonary arterial hypertension).  Suspect group 3 disease due to COPD (severe obstruction on PFTs) and OSA/OHS.  He is on home oxygen but has severe OSA that is not currently treated.  V/Q scan not suggestive of chronic PEs.  - Continue chronic oxygen.  - Probably would not benefit significantly from pulmonary vasodilators.  3. OHS/OSA:  - Continue chronic O2.  -Continue nightly CPAP.   4. Cirrhosis: Suspected NASH.  Denies ETOH. Cannot totally rule out component of portopulmonary hypertension. He is on spironolactone at high dose.   - I reviewed BMET from 01/27/18.  5. Atrial fibrillation: Chronic, suspect permanent (present since at least 2015). -Rate controlled. No bleeding problems.    - Continue Eliquis 5 mg BID. Denies bleeding. 6. Super Morbid Obesity - Body mass index is 58.93 kg/m. - Discussed portion control.  Follow up in 2-3 months. Check BMEt at that time.      Darrick Grinder, NP  01/31/2018

## 2018-02-02 DIAGNOSIS — L97111 Non-pressure chronic ulcer of right thigh limited to breakdown of skin: Secondary | ICD-10-CM | POA: Diagnosis not present

## 2018-02-02 DIAGNOSIS — L97212 Non-pressure chronic ulcer of right calf with fat layer exposed: Secondary | ICD-10-CM | POA: Diagnosis not present

## 2018-02-02 DIAGNOSIS — L97112 Non-pressure chronic ulcer of right thigh with fat layer exposed: Secondary | ICD-10-CM | POA: Diagnosis not present

## 2018-02-16 ENCOUNTER — Encounter (HOSPITAL_BASED_OUTPATIENT_CLINIC_OR_DEPARTMENT_OTHER): Payer: Medicare Other | Attending: Internal Medicine

## 2018-02-16 DIAGNOSIS — I890 Lymphedema, not elsewhere classified: Secondary | ICD-10-CM | POA: Insufficient documentation

## 2018-02-16 DIAGNOSIS — L97112 Non-pressure chronic ulcer of right thigh with fat layer exposed: Secondary | ICD-10-CM | POA: Insufficient documentation

## 2018-02-16 DIAGNOSIS — J449 Chronic obstructive pulmonary disease, unspecified: Secondary | ICD-10-CM | POA: Insufficient documentation

## 2018-02-16 DIAGNOSIS — S71101A Unspecified open wound, right thigh, initial encounter: Secondary | ICD-10-CM | POA: Diagnosis not present

## 2018-02-16 DIAGNOSIS — L97219 Non-pressure chronic ulcer of right calf with unspecified severity: Secondary | ICD-10-CM | POA: Insufficient documentation

## 2018-02-16 DIAGNOSIS — G4730 Sleep apnea, unspecified: Secondary | ICD-10-CM | POA: Insufficient documentation

## 2018-02-16 DIAGNOSIS — I509 Heart failure, unspecified: Secondary | ICD-10-CM | POA: Insufficient documentation

## 2018-02-16 DIAGNOSIS — Z9221 Personal history of antineoplastic chemotherapy: Secondary | ICD-10-CM | POA: Insufficient documentation

## 2018-02-16 DIAGNOSIS — L97212 Non-pressure chronic ulcer of right calf with fat layer exposed: Secondary | ICD-10-CM | POA: Diagnosis not present

## 2018-02-16 DIAGNOSIS — Z923 Personal history of irradiation: Secondary | ICD-10-CM | POA: Diagnosis not present

## 2018-02-18 DIAGNOSIS — G4733 Obstructive sleep apnea (adult) (pediatric): Secondary | ICD-10-CM | POA: Diagnosis not present

## 2018-02-20 ENCOUNTER — Other Ambulatory Visit: Payer: Self-pay | Admitting: *Deleted

## 2018-02-20 DIAGNOSIS — Z8572 Personal history of non-Hodgkin lymphomas: Secondary | ICD-10-CM

## 2018-02-20 MED ORDER — OXYCODONE HCL 20 MG PO TABS: 20.0000 mg | ORAL_TABLET | Freq: Three times a day (TID) | ORAL | 0 refills | Status: DC | PRN

## 2018-02-20 MED ORDER — OXYCODONE HCL ER 40 MG PO T12A: 40.0000 mg | EXTENDED_RELEASE_TABLET | Freq: Two times a day (BID) | ORAL | 0 refills | Status: DC

## 2018-02-21 DIAGNOSIS — K7460 Unspecified cirrhosis of liver: Secondary | ICD-10-CM | POA: Diagnosis not present

## 2018-02-21 DIAGNOSIS — I482 Chronic atrial fibrillation: Secondary | ICD-10-CM | POA: Diagnosis not present

## 2018-02-21 DIAGNOSIS — M170 Bilateral primary osteoarthritis of knee: Secondary | ICD-10-CM | POA: Diagnosis not present

## 2018-02-21 DIAGNOSIS — I272 Pulmonary hypertension, unspecified: Secondary | ICD-10-CM | POA: Diagnosis not present

## 2018-02-21 DIAGNOSIS — M549 Dorsalgia, unspecified: Secondary | ICD-10-CM | POA: Diagnosis not present

## 2018-02-21 DIAGNOSIS — I5032 Chronic diastolic (congestive) heart failure: Secondary | ICD-10-CM | POA: Diagnosis not present

## 2018-02-21 DIAGNOSIS — G4733 Obstructive sleep apnea (adult) (pediatric): Secondary | ICD-10-CM | POA: Diagnosis not present

## 2018-02-21 DIAGNOSIS — G8929 Other chronic pain: Secondary | ICD-10-CM | POA: Diagnosis not present

## 2018-02-21 DIAGNOSIS — I872 Venous insufficiency (chronic) (peripheral): Secondary | ICD-10-CM | POA: Diagnosis not present

## 2018-02-21 DIAGNOSIS — L97112 Non-pressure chronic ulcer of right thigh with fat layer exposed: Secondary | ICD-10-CM | POA: Diagnosis not present

## 2018-02-21 DIAGNOSIS — J449 Chronic obstructive pulmonary disease, unspecified: Secondary | ICD-10-CM | POA: Diagnosis not present

## 2018-02-21 DIAGNOSIS — L97111 Non-pressure chronic ulcer of right thigh limited to breakdown of skin: Secondary | ICD-10-CM | POA: Diagnosis not present

## 2018-02-21 DIAGNOSIS — I890 Lymphedema, not elsewhere classified: Secondary | ICD-10-CM | POA: Diagnosis not present

## 2018-02-28 DIAGNOSIS — M2550 Pain in unspecified joint: Secondary | ICD-10-CM | POA: Diagnosis not present

## 2018-02-28 DIAGNOSIS — I272 Pulmonary hypertension, unspecified: Secondary | ICD-10-CM | POA: Diagnosis not present

## 2018-02-28 DIAGNOSIS — R0902 Hypoxemia: Secondary | ICD-10-CM | POA: Diagnosis not present

## 2018-02-28 DIAGNOSIS — I5032 Chronic diastolic (congestive) heart failure: Secondary | ICD-10-CM | POA: Diagnosis not present

## 2018-03-02 DIAGNOSIS — L97212 Non-pressure chronic ulcer of right calf with fat layer exposed: Secondary | ICD-10-CM | POA: Diagnosis not present

## 2018-03-02 DIAGNOSIS — L97112 Non-pressure chronic ulcer of right thigh with fat layer exposed: Secondary | ICD-10-CM | POA: Diagnosis not present

## 2018-03-02 DIAGNOSIS — I890 Lymphedema, not elsewhere classified: Secondary | ICD-10-CM | POA: Diagnosis not present

## 2018-03-02 DIAGNOSIS — L97219 Non-pressure chronic ulcer of right calf with unspecified severity: Secondary | ICD-10-CM | POA: Diagnosis not present

## 2018-03-07 ENCOUNTER — Other Ambulatory Visit: Payer: Self-pay | Admitting: Gastroenterology

## 2018-03-07 DIAGNOSIS — K7469 Other cirrhosis of liver: Secondary | ICD-10-CM

## 2018-03-14 DIAGNOSIS — L97219 Non-pressure chronic ulcer of right calf with unspecified severity: Secondary | ICD-10-CM | POA: Diagnosis not present

## 2018-03-14 DIAGNOSIS — L97119 Non-pressure chronic ulcer of right thigh with unspecified severity: Secondary | ICD-10-CM | POA: Diagnosis not present

## 2018-03-16 ENCOUNTER — Other Ambulatory Visit: Payer: Medicare Other

## 2018-03-20 DIAGNOSIS — G4733 Obstructive sleep apnea (adult) (pediatric): Secondary | ICD-10-CM | POA: Diagnosis not present

## 2018-03-21 ENCOUNTER — Ambulatory Visit
Admission: RE | Admit: 2018-03-21 | Discharge: 2018-03-21 | Disposition: A | Discharge status: Home / Self Care | Payer: Medicare Other | Source: Ambulatory Visit | Attending: Gastroenterology | Admitting: Gastroenterology

## 2018-03-21 DIAGNOSIS — K7469 Other cirrhosis of liver: Secondary | ICD-10-CM

## 2018-03-21 DIAGNOSIS — K7460 Unspecified cirrhosis of liver: Secondary | ICD-10-CM | POA: Diagnosis not present

## 2018-03-22 DIAGNOSIS — E782 Mixed hyperlipidemia: Secondary | ICD-10-CM | POA: Diagnosis not present

## 2018-03-22 DIAGNOSIS — K7460 Unspecified cirrhosis of liver: Secondary | ICD-10-CM | POA: Diagnosis not present

## 2018-03-22 DIAGNOSIS — Z0000 Encounter for general adult medical examination without abnormal findings: Secondary | ICD-10-CM | POA: Diagnosis not present

## 2018-03-22 DIAGNOSIS — C8590 Non-Hodgkin lymphoma, unspecified, unspecified site: Secondary | ICD-10-CM | POA: Diagnosis not present

## 2018-03-23 ENCOUNTER — Other Ambulatory Visit: Payer: Self-pay | Admitting: *Deleted

## 2018-03-23 DIAGNOSIS — Z8572 Personal history of non-Hodgkin lymphomas: Secondary | ICD-10-CM

## 2018-03-23 MED ORDER — OXYCODONE HCL ER 40 MG PO T12A: 40.0000 mg | EXTENDED_RELEASE_TABLET | Freq: Two times a day (BID) | ORAL | 0 refills | Status: DC

## 2018-03-23 MED ORDER — OXYCODONE HCL 20 MG PO TABS: 20.0000 mg | ORAL_TABLET | Freq: Three times a day (TID) | ORAL | 0 refills | Status: DC | PRN

## 2018-03-30 ENCOUNTER — Ambulatory Visit (HOSPITAL_COMMUNITY)
Admission: RE | Admit: 2018-03-30 | Discharge: 2018-03-30 | Disposition: A | Discharge status: Home / Self Care | Payer: Medicare Other | Source: Ambulatory Visit | Attending: Internal Medicine | Admitting: Internal Medicine

## 2018-03-30 ENCOUNTER — Encounter (HOSPITAL_COMMUNITY): Payer: Self-pay

## 2018-03-30 VITALS — BP 112/84 | HR 66 | Wt >= 6400 oz

## 2018-03-30 DIAGNOSIS — J449 Chronic obstructive pulmonary disease, unspecified: Secondary | ICD-10-CM | POA: Insufficient documentation

## 2018-03-30 DIAGNOSIS — R0902 Hypoxemia: Secondary | ICD-10-CM | POA: Diagnosis not present

## 2018-03-30 DIAGNOSIS — Z8249 Family history of ischemic heart disease and other diseases of the circulatory system: Secondary | ICD-10-CM | POA: Insufficient documentation

## 2018-03-30 DIAGNOSIS — E785 Hyperlipidemia, unspecified: Secondary | ICD-10-CM | POA: Insufficient documentation

## 2018-03-30 DIAGNOSIS — I872 Venous insufficiency (chronic) (peripheral): Secondary | ICD-10-CM | POA: Diagnosis not present

## 2018-03-30 DIAGNOSIS — I272 Pulmonary hypertension, unspecified: Secondary | ICD-10-CM | POA: Insufficient documentation

## 2018-03-30 DIAGNOSIS — I482 Chronic atrial fibrillation: Secondary | ICD-10-CM | POA: Insufficient documentation

## 2018-03-30 DIAGNOSIS — Z79899 Other long term (current) drug therapy: Secondary | ICD-10-CM | POA: Diagnosis not present

## 2018-03-30 DIAGNOSIS — G4733 Obstructive sleep apnea (adult) (pediatric): Secondary | ICD-10-CM

## 2018-03-30 DIAGNOSIS — L97909 Non-pressure chronic ulcer of unspecified part of unspecified lower leg with unspecified severity: Secondary | ICD-10-CM | POA: Diagnosis not present

## 2018-03-30 DIAGNOSIS — F329 Major depressive disorder, single episode, unspecified: Secondary | ICD-10-CM | POA: Diagnosis not present

## 2018-03-30 DIAGNOSIS — I890 Lymphedema, not elsewhere classified: Secondary | ICD-10-CM | POA: Diagnosis not present

## 2018-03-30 DIAGNOSIS — Z87891 Personal history of nicotine dependence: Secondary | ICD-10-CM | POA: Insufficient documentation

## 2018-03-30 DIAGNOSIS — I4819 Other persistent atrial fibrillation: Secondary | ICD-10-CM

## 2018-03-30 DIAGNOSIS — R42 Dizziness and giddiness: Secondary | ICD-10-CM | POA: Diagnosis not present

## 2018-03-30 DIAGNOSIS — I4891 Unspecified atrial fibrillation: Secondary | ICD-10-CM | POA: Diagnosis not present

## 2018-03-30 DIAGNOSIS — M2550 Pain in unspecified joint: Secondary | ICD-10-CM | POA: Diagnosis not present

## 2018-03-30 DIAGNOSIS — Z7901 Long term (current) use of anticoagulants: Secondary | ICD-10-CM | POA: Insufficient documentation

## 2018-03-30 DIAGNOSIS — I481 Persistent atrial fibrillation: Secondary | ICD-10-CM | POA: Diagnosis not present

## 2018-03-30 DIAGNOSIS — Z6841 Body Mass Index (BMI) 40.0 and over, adult: Secondary | ICD-10-CM | POA: Diagnosis not present

## 2018-03-30 DIAGNOSIS — Z79891 Long term (current) use of opiate analgesic: Secondary | ICD-10-CM | POA: Insufficient documentation

## 2018-03-30 DIAGNOSIS — I5032 Chronic diastolic (congestive) heart failure: Secondary | ICD-10-CM

## 2018-03-30 DIAGNOSIS — K7460 Unspecified cirrhosis of liver: Secondary | ICD-10-CM | POA: Diagnosis not present

## 2018-03-30 DIAGNOSIS — Z809 Family history of malignant neoplasm, unspecified: Secondary | ICD-10-CM | POA: Insufficient documentation

## 2018-03-30 MED ORDER — TORSEMIDE 20 MG PO TABS: 40.0000 mg | ORAL_TABLET | Freq: Every day | ORAL | 11 refills | Status: DC

## 2018-03-30 NOTE — Progress Notes (Signed)
Advanced Heart Failure Clinic Note   PCP: Dr. Lynnda Child Cardiology: Dr. Martinique HF Cardiology: Dr. Lynelle Doctor is a 62 y.o. male with permanent atrial fibrillation, NASH with cirrhosis, suspected OHS/OSA, and chronic diastolic CHF/RV failure was referred by Dr. Martinique for evaluation of CHF.   Patient has had atrial fibrillation since at least 2015. He has had problems with peripheral edema x years. He has also had some degree of dyspnea for a long time.  However, towards the end of 2018, dyspnea became markedly worse.  At last appointment, I started him on home oxygen.   Echo was done in 11/18 showing EF 50-55%, septal flattening, severe RV dilation, PASP 77 mmHg. LHC/RHC in 1/19 showed no CAD, evidence for RV failure and severe pulmonary hypertension.  PFTs in 1/19 showed severe COPD.  V/Q scan in 1/19 showed no evidence for chronic PEs.  Sleep study showed severe OSA.    He presents today for regular follow up. Has been slightly lightheaded. States his creatinine was mildly elevated at his PCP visit last week.  He denies PND or orthopnea. Still eating out several times a week. Had two wounds on his RLE, that have since improved with Wound Care following. Using his CPAP daily. Weight at home down close to 420 lbs. Taking all medications as directed. On chronic O2 2L continuously or 4.5 L pulse.   Review of systems complete and found to be negative unless listed in HPI.    PMH: 1. Non-hodgkins lymphoma: follicular small cell lymphoma.  S/p treatment, in clinical remission.  2. Cirrhosis: Suspect NASH.  - Abdominal US (10/18): Suggestive of cirrhosis.  3. Hyperlipidemia.  4. Obesity 5. Atrial fibrillation: Suspected permanent. He appears to have been in atrial fibrillation at least back to 2015.  6. Depression 7. Venous insufficiency 8. OHS/OSA: Sleep study with severe OSA.  9. H/o vertebral osteomyelitis.  10. OA knees 11. Chronic diastolic CHF/RV failure: Echo (11/18) with  EF 50-55%, septal flattening, RV severely dilated with normal systolic function, PASP 77 mmHg, moderate TR.  12. Pulmonary hypertension: Suspect due to OHS/OSA and COPD.  - RHC (1/19): mean RA 20, PA 87/37 mean 52, mean PCWP 16, CI 2.12, PVR 5.6 WU.  - V/Q scan 1/19 with no evidence for chronic PE.                                                                                                                 13. Coronary angiography (1/19): No significant CAD.  14. COPD: PFTs in 1/19 suggested severe COPD.   Social History   Socioeconomic History  . Marital status: Divorced    Spouse name: Not on file  . Number of children: 2  . Years of education: Not on file  . Highest education level: Not on file  Occupational History  . Occupation: disabled  Social Needs  . Financial resource strain: Not on file  . Food insecurity:    Worry: Not on file    Inability: Not on  file  . Transportation needs:    Medical: Not on file    Non-medical: Not on file  Tobacco Use  . Smoking status: Former Smoker    Packs/day: 0.50    Years: 8.00    Pack years: 4.00    Types: Cigarettes    Start date: 09/26/1966    Last attempt to quit: 06/26/1989    Years since quitting: 28.7  . Smokeless tobacco: Never Used  . Tobacco comment: quit 38 years ago  Substance and Sexual Activity  . Alcohol use: No    Alcohol/week: 0.0 oz  . Drug use: No  . Sexual activity: Not on file  Lifestyle  . Physical activity:    Days per week: Not on file    Minutes per session: Not on file  . Stress: Not on file  Relationships  . Social connections:    Talks on phone: Not on file    Gets together: Not on file    Attends religious service: Not on file    Active member of club or organization: Not on file    Attends meetings of clubs or organizations: Not on file    Relationship status: Not on file  . Intimate partner violence:    Fear of current or ex partner: Not on file    Emotionally abused: Not on file     Physically abused: Not on file    Forced sexual activity: Not on file  Other Topics Concern  . Not on file  Social History Narrative  . Not on file   Family History  Problem Relation Age of Onset  . Cancer Father        pacemaker  . Bradycardia Father    Current Outpatient Medications  Medication Sig Dispense Refill  . apixaban (ELIQUIS) 5 MG TABS tablet Take 5 mg by mouth 2 (two) times daily.    . cycloSPORINE (RESTASIS) 0.05 % ophthalmic emulsion Place 1 drop into both eyes 2 (two) times daily as needed (for eye irritation/dryness).    Marland Kitchen diclofenac sodium (VOLTAREN) 1 % GEL Apply 1 g topically 4 (four) times daily as needed (for knee pain.).    Marland Kitchen etodolac (LODINE) 400 MG tablet Take 400 mg by mouth 2 (two) times daily.    . fluticasone (FLONASE) 50 MCG/ACT nasal spray Place 2 sprays into both nostrils at bedtime.     Marland Kitchen lactulose (CHRONULAC) 10 GM/15ML solution Take 10 g by mouth daily.     Marland Kitchen levothyroxine (SYNTHROID, LEVOTHROID) 125 MCG tablet Take 125 mcg by mouth daily before breakfast.   0  . Misc. Devices (ROLLATOR) MISC Use daily with walking 1 each 0  . nadolol (CORGARD) 20 MG tablet Take 20 mg by mouth daily.     Marland Kitchen NARCAN 4 MG/0.1ML LIQD nasal spray kit Place 1 spray into the nose as needed (For accidental opoid overdose).   0  . omega-3 acid ethyl esters (LOVAZA) 1 g capsule Take 2 g by mouth 2 (two) times daily.   0  . oxyCODONE (OXYCONTIN) 40 mg 12 hr tablet Take 1 tablet (40 mg total) by mouth every 12 (twelve) hours. Do not fill until 07/25/2017 60 tablet 0  . Oxycodone HCl 20 MG TABS Take 1 tablet (20 mg total) by mouth every 8 (eight) hours as needed. 90 tablet 0  . spironolactone (ALDACTONE) 100 MG tablet Take 100 mg by mouth 2 (two) times daily.      Marland Kitchen torsemide (DEMADEX) 20 MG tablet Take 2  tablets (40 mg) in the morning and 1 tablet (20 mg) in the afternoon.   If weight is >437 lb take an extra 20 mg.     No current facility-administered medications for this  encounter.    Vitals:   03/30/18 1341  BP: 112/84  Pulse: 66  SpO2: (!) 88%  Weight: (!) 426 lb 12.8 oz (193.6 kg)     Wt Readings from Last 3 Encounters:  03/30/18 (!) 426 lb 12.8 oz (193.6 kg)  01/31/18 (!) 434 lb 8 oz (197.1 kg)  01/27/18 (!) 432 lb (196 kg)   Physical Exam General: Ambulated into clinic with a rollator. NAD HEENT: Normal Neck: Supple. JVP difficult due to body habitus. Carotids 2+ bilat; no bruits. No thyromegaly or nodule noted. Cor: PMI nondisplaced. RRR, No M/G/R noted Lungs: CTAB, normal effort. Abdomen: Soft, non-tender, non-distended, no HSM. No bruits or masses. +BS  Extremities: No cyanosis, clubbing, or rash. Compression wraps.  Neuro: Alert & orientedx3, cranial nerves grossly intact. moves all 4 extremities w/o difficulty. Affect pleasant   1. Chronic diastolic CHF with prominent RV failure: - Echo most remarkable for pulmonary hypertension and RV failure (11/18).  -Suspect RV failure is triggered by pulmonary hypertension.  Underlying OHS/OSA and COPD have likely caused pulmonary hypertension/RV failure. - NYHA III. Severely limited by body habitus.  - Volume status very difficult. No obvious JVP.   - Decrease torsemide to 40 mg daily with recent AKI. Can take extra 20 mg in evening as needed.  - Continue UNNA boots.  - Appreciate WOC.  - Reinforced fluid restriction to < 2 L daily, sodium restriction to less than 2000 mg daily, and the importance of daily weights.   2. Pulmonary hypertension: Severe by echo and RHC (pulmonary arterial hypertension).  Suspect group 3 disease due to COPD (severe obstruction on PFTs) and OSA/OHS.  He is on home oxygen but has severe OSA that is not currently treated.  V/Q scan not suggestive of chronic PEs.  - Continue chronic O2.  - Most likely would not benefit significantly from pulmonary vasodilators.  3. OHS/OSA:  - Continue chronic O2.  - Continue nightly CPAP.   4. Cirrhosis: Suspected NASH.  Denies  ETOH. Cannot totally rule out component of portopulmonary hypertension. He is on spironolactone at high dose.   - BMET today.  5. Atrial fibrillation: Chronic, suspect permanent (present since at least 2015). - Rate controlled. No bleeding problems.  - Continue Eliquis 5 mg BID. Denies bleeding.  6. Super Morbid Obesity - Body mass index is 57.88 kg/m. - Discussed portion control.   Lightheadedness likely due to at least mild over-diuresis in setting of RV failure. Will cut torsemide back.  RTC 3 months. Sooner with symptoms.   Shirley Friar, PA-C  03/30/2018   Greater than 50% of the 25 minute visit was spent in counseling/coordination of care regarding disease state education, salt/fluid restriction, sliding scale diuretics, and medication compliance.

## 2018-03-30 NOTE — Patient Instructions (Signed)
DECREASE Torsemide to 40 mg (2 tabs) once daily.  Follow up 3 months with Dr. Aundra Dubin.  __________________________________________________________ Albert James Code: 1500  Take all medication as prescribed the day of your appointment. Bring all medications with you to your appointment.  Do the following things EVERYDAY: 1) Weigh yourself in the morning before breakfast. Write it down and keep it in a log. 2) Take your medicines as prescribed 3) Eat low salt foods-Limit salt (sodium) to 2000 mg per day.  4) Stay as active as you can everyday 5) Limit all fluids for the day to less than 2 liters

## 2018-04-24 ENCOUNTER — Other Ambulatory Visit: Payer: Self-pay | Admitting: *Deleted

## 2018-04-24 DIAGNOSIS — Z8572 Personal history of non-Hodgkin lymphomas: Secondary | ICD-10-CM

## 2018-04-24 MED ORDER — OXYCODONE HCL ER 40 MG PO T12A: 40.0000 mg | EXTENDED_RELEASE_TABLET | Freq: Two times a day (BID) | ORAL | 0 refills | Status: DC

## 2018-04-24 MED ORDER — OXYCODONE HCL 20 MG PO TABS: 20.0000 mg | ORAL_TABLET | Freq: Three times a day (TID) | ORAL | 0 refills | Status: DC | PRN

## 2018-04-25 ENCOUNTER — Encounter: Payer: Self-pay | Admitting: Podiatry

## 2018-04-25 ENCOUNTER — Ambulatory Visit (INDEPENDENT_AMBULATORY_CARE_PROVIDER_SITE_OTHER): Payer: Medicare Other | Admitting: Podiatry

## 2018-04-25 DIAGNOSIS — B351 Tinea unguium: Secondary | ICD-10-CM

## 2018-04-25 DIAGNOSIS — D689 Coagulation defect, unspecified: Secondary | ICD-10-CM

## 2018-04-25 DIAGNOSIS — M79676 Pain in unspecified toe(s): Secondary | ICD-10-CM | POA: Diagnosis not present

## 2018-04-25 NOTE — Progress Notes (Signed)
Patient ID: Albert James, male   DOB: [birth year], 62 y.o.   MRN: [id number] Complaint:  Visit Type: Patient returns to my office for continued preventative foot care services. Complaint: Patient states" my nails have grown long and thick and become painful to walk and wear shoes" Patient is obese and unable to get to his feet. The patient presents for preventative foot care services. No changes to ROS.  Patient is on eliquiss.  Podiatric Exam: Vascular: dorsalis pedis and posterior tibial pulses are not  palpable bilateral. Capillary return is immediate. Temperature gradient is WNL. Skin turgor WNL  Sensorium: Normal Semmes Weinstein monofilament test. Normal tactile sensation bilaterally. Nail Exam: Pt has thick disfigured discolored nails with subungual debris noted bilateral entire nail hallux through fifth toenails Ulcer Exam: There is no evidence of ulcer or pre-ulcerative changes or infection. Orthopedic Exam: Muscle tone and strength are WNL. No limitations in general ROM. No crepitus or effusions noted. Foot type and digits show no abnormalities. Bony prominences are unremarkable. Severe swelling in legs and feet. Skin: No Porokeratosis. No infection or ulcers  Diagnosis:  Onychomycosis, , Pain in right toe, pain in left toes  Treatment & Plan Procedures and Treatment: Consent by patient was obtained for treatment procedures. The patient understood the discussion of treatment and procedures well. All questions were answered thoroughly reviewed. Debridement of mycotic and hypertrophic toenails, 1 through 5 bilateral and clearing of subungual debris. No ulceration, no infection noted.  Return Visit-Office Procedure: Patient instructed to return to the office for a follow up visit 10 weeks  for continued evaluation and treatment.    Gardiner Barefoot DPM

## 2018-04-26 DIAGNOSIS — G4733 Obstructive sleep apnea (adult) (pediatric): Secondary | ICD-10-CM | POA: Diagnosis not present

## 2018-04-30 DIAGNOSIS — M2550 Pain in unspecified joint: Secondary | ICD-10-CM | POA: Diagnosis not present

## 2018-04-30 DIAGNOSIS — I272 Pulmonary hypertension, unspecified: Secondary | ICD-10-CM | POA: Diagnosis not present

## 2018-04-30 DIAGNOSIS — R0902 Hypoxemia: Secondary | ICD-10-CM | POA: Diagnosis not present

## 2018-04-30 DIAGNOSIS — I5032 Chronic diastolic (congestive) heart failure: Secondary | ICD-10-CM | POA: Diagnosis not present

## 2018-05-04 ENCOUNTER — Encounter (HOSPITAL_COMMUNITY): Payer: Self-pay

## 2018-05-04 NOTE — Progress Notes (Signed)
Signed and mailed back to [place] at [place], [place]

## 2018-05-23 ENCOUNTER — Other Ambulatory Visit: Payer: Self-pay | Admitting: *Deleted

## 2018-05-23 DIAGNOSIS — Z8572 Personal history of non-Hodgkin lymphomas: Secondary | ICD-10-CM

## 2018-05-23 MED ORDER — OXYCODONE HCL ER 40 MG PO T12A: 40.0000 mg | EXTENDED_RELEASE_TABLET | Freq: Two times a day (BID) | ORAL | 0 refills | Status: DC

## 2018-05-23 MED ORDER — OXYCODONE HCL 20 MG PO TABS: 20.0000 mg | ORAL_TABLET | Freq: Three times a day (TID) | ORAL | 0 refills | Status: DC | PRN

## 2018-05-30 DIAGNOSIS — I5032 Chronic diastolic (congestive) heart failure: Secondary | ICD-10-CM | POA: Diagnosis not present

## 2018-05-30 DIAGNOSIS — I272 Pulmonary hypertension, unspecified: Secondary | ICD-10-CM | POA: Diagnosis not present

## 2018-05-30 DIAGNOSIS — R0902 Hypoxemia: Secondary | ICD-10-CM | POA: Diagnosis not present

## 2018-05-30 DIAGNOSIS — M2550 Pain in unspecified joint: Secondary | ICD-10-CM | POA: Diagnosis not present

## 2018-06-13 ENCOUNTER — Other Ambulatory Visit (HOSPITAL_COMMUNITY): Payer: Self-pay

## 2018-06-13 MED ORDER — APIXABAN 5 MG PO TABS: 5.0000 mg | ORAL_TABLET | Freq: Two times a day (BID) | ORAL | 1 refills | Status: DC

## 2018-06-19 ENCOUNTER — Other Ambulatory Visit: Payer: Self-pay

## 2018-06-19 DIAGNOSIS — Z8572 Personal history of non-Hodgkin lymphomas: Secondary | ICD-10-CM

## 2018-06-19 MED ORDER — OXYCODONE HCL ER 40 MG PO T12A: 40.0000 mg | EXTENDED_RELEASE_TABLET | Freq: Two times a day (BID) | ORAL | 0 refills | Status: DC

## 2018-06-19 MED ORDER — OXYCODONE HCL 20 MG PO TABS: 20.0000 mg | ORAL_TABLET | Freq: Three times a day (TID) | ORAL | 0 refills | Status: DC | PRN

## 2018-06-20 ENCOUNTER — Ambulatory Visit (HOSPITAL_COMMUNITY)
Admission: RE | Admit: 2018-06-20 | Discharge: 2018-06-20 | Disposition: A | Discharge status: Home / Self Care | Payer: Medicare Other | Source: Ambulatory Visit | Attending: Cardiology | Admitting: Cardiology

## 2018-06-20 VITALS — BP 128/80 | HR 68 | Wt >= 6400 oz

## 2018-06-20 DIAGNOSIS — Z9981 Dependence on supplemental oxygen: Secondary | ICD-10-CM | POA: Insufficient documentation

## 2018-06-20 DIAGNOSIS — Z79891 Long term (current) use of opiate analgesic: Secondary | ICD-10-CM | POA: Diagnosis not present

## 2018-06-20 DIAGNOSIS — I5032 Chronic diastolic (congestive) heart failure: Secondary | ICD-10-CM

## 2018-06-20 DIAGNOSIS — Z79899 Other long term (current) drug therapy: Secondary | ICD-10-CM | POA: Insufficient documentation

## 2018-06-20 DIAGNOSIS — Z79890 Hormone replacement therapy (postmenopausal): Secondary | ICD-10-CM | POA: Insufficient documentation

## 2018-06-20 DIAGNOSIS — Z87891 Personal history of nicotine dependence: Secondary | ICD-10-CM | POA: Diagnosis not present

## 2018-06-20 DIAGNOSIS — C8590 Non-Hodgkin lymphoma, unspecified, unspecified site: Secondary | ICD-10-CM | POA: Diagnosis not present

## 2018-06-20 DIAGNOSIS — E662 Morbid (severe) obesity with alveolar hypoventilation: Secondary | ICD-10-CM | POA: Insufficient documentation

## 2018-06-20 DIAGNOSIS — I272 Pulmonary hypertension, unspecified: Secondary | ICD-10-CM | POA: Insufficient documentation

## 2018-06-20 DIAGNOSIS — I482 Chronic atrial fibrillation: Secondary | ICD-10-CM | POA: Insufficient documentation

## 2018-06-20 DIAGNOSIS — Z6841 Body Mass Index (BMI) 40.0 and over, adult: Secondary | ICD-10-CM | POA: Insufficient documentation

## 2018-06-20 DIAGNOSIS — J449 Chronic obstructive pulmonary disease, unspecified: Secondary | ICD-10-CM | POA: Diagnosis not present

## 2018-06-20 DIAGNOSIS — I872 Venous insufficiency (chronic) (peripheral): Secondary | ICD-10-CM | POA: Insufficient documentation

## 2018-06-20 DIAGNOSIS — I4819 Other persistent atrial fibrillation: Secondary | ICD-10-CM

## 2018-06-20 DIAGNOSIS — M1710 Unilateral primary osteoarthritis, unspecified knee: Secondary | ICD-10-CM | POA: Diagnosis not present

## 2018-06-20 DIAGNOSIS — Z7901 Long term (current) use of anticoagulants: Secondary | ICD-10-CM | POA: Diagnosis not present

## 2018-06-20 DIAGNOSIS — I481 Persistent atrial fibrillation: Secondary | ICD-10-CM

## 2018-06-20 DIAGNOSIS — Z8249 Family history of ischemic heart disease and other diseases of the circulatory system: Secondary | ICD-10-CM | POA: Insufficient documentation

## 2018-06-20 DIAGNOSIS — E785 Hyperlipidemia, unspecified: Secondary | ICD-10-CM | POA: Insufficient documentation

## 2018-06-20 DIAGNOSIS — K7460 Unspecified cirrhosis of liver: Secondary | ICD-10-CM | POA: Insufficient documentation

## 2018-06-20 DIAGNOSIS — Z7951 Long term (current) use of inhaled steroids: Secondary | ICD-10-CM | POA: Diagnosis not present

## 2018-06-20 LAB — BASIC METABOLIC PANEL
Anion gap: 7 (ref 5–15)
BUN: 26 mg/dL — ABNORMAL HIGH (ref 8–23)
CALCIUM: 9.2 mg/dL (ref 8.9–10.3)
CO2: 27 mmol/L (ref 22–32)
CREATININE: 1.38 mg/dL — AB (ref 0.61–1.24)
Chloride: 106 mmol/L (ref 98–111)
GFR calc Af Amer: >60 mL/min (ref >60)
GFR, EST NON AFRICAN AMERICAN: 53 mL/min — AB (ref >60)
Glucose, Bld: 104 mg/dL — ABNORMAL HIGH (ref 70–99)
POTASSIUM: 5.3 mmol/L — AB (ref 3.5–5.1)
SODIUM: 140 mmol/L (ref 135–145)

## 2018-06-20 MED ORDER — TORSEMIDE 20 MG PO TABS: 60.0000 mg | ORAL_TABLET | Freq: Every day | ORAL | 11 refills | Status: DC

## 2018-06-20 NOTE — Progress Notes (Signed)
Advanced Heart Failure Clinic Note   PCP: Dr. Lynnda Child Cardiology: Dr. Martinique HF Cardiology: Dr. Lynelle Doctor is a 62 y.o. male with permanent atrial fibrillation, NASH with cirrhosis, suspected OHS/OSA, and chronic diastolic CHF/RV failure was referred by Dr. Martinique for evaluation of CHF.   Patient has had atrial fibrillation since at least 2015. He has had problems with peripheral edema x years. He has also had some degree of dyspnea for a long time.  However, towards the end of 2018, dyspnea became markedly worse.  At last appointment, I started him on home oxygen.   Echo was done in 11/18 showing EF 50-55%, septal flattening, severe RV dilation, PASP 77 mmHg. LHC/RHC in 1/19 showed no CAD, evidence for RV failure and severe pulmonary hypertension.  PFTs in 1/19 showed severe COPD.  V/Q scan in 1/19 showed no evidence for chronic PEs.  Sleep study showed severe OSA.    He presents today for regular follow up. Last visit torsemide was decreased. Overall doing well. Taking extra torsemide 2-3x/week. Dizziness is resolved. He is SOB with moderate exertion. None with ADLs. Denies orthopnea or PND. He has chronic BLE edema, which he changes himself. No longer seeing wound care - those wounds have resolved. Wearing CPAP qHS. Wears O2 almost at all times. Denies bleeding on Eliquis. No palpitations. Taking all medications. Weights at home: ~424 lbs. Limits fluid and salt intake.   Review of systems complete and found to be negative unless listed in HPI.   PMH: 1. Non-hodgkins lymphoma: follicular small cell lymphoma.  S/p treatment, in clinical remission.  2. Cirrhosis: Suspect NASH.  - Abdominal US (10/18): Suggestive of cirrhosis.  3. Hyperlipidemia.  4. Obesity 5. Atrial fibrillation: Suspected permanent. He appears to have been in atrial fibrillation at least back to 2015.  6. Depression 7. Venous insufficiency 8. OHS/OSA: Sleep study with severe OSA.  9. H/o vertebral  osteomyelitis.  10. OA knees 11. Chronic diastolic CHF/RV failure: Echo (11/18) with EF 50-55%, septal flattening, RV severely dilated with normal systolic function, PASP 77 mmHg, moderate TR.  12. Pulmonary hypertension: Suspect due to OHS/OSA and COPD.  - RHC (1/19): mean RA 20, PA 87/37 mean 52, mean PCWP 16, CI 2.12, PVR 5.6 WU.  - V/Q scan 1/19 with no evidence for chronic PE.                                                                                                                 13. Coronary angiography (1/19): No significant CAD.  14. COPD: PFTs in 1/19 suggested severe COPD.   Social History   Socioeconomic History  . Marital status: Divorced    Spouse name: Not on file  . Number of children: 2  . Years of education: Not on file  . Highest education level: Not on file  Occupational History  . Occupation: disabled  Social Needs  . Financial resource strain: Not on file  . Food insecurity:    Worry: Not on file  Inability: Not on file  . Transportation needs:    Medical: Not on file    Non-medical: Not on file  Tobacco Use  . Smoking status: Former Smoker    Packs/day: 0.50    Years: 8.00    Pack years: 4.00    Types: Cigarettes    Start date: 09/26/1966    Last attempt to quit: 06/26/1989    Years since quitting: 29.0  . Smokeless tobacco: Never Used  . Tobacco comment: quit 38 years ago  Substance and Sexual Activity  . Alcohol use: No    Alcohol/week: 0.0 oz  . Drug use: No  . Sexual activity: Not on file  Lifestyle  . Physical activity:    Days per week: Not on file    Minutes per session: Not on file  . Stress: Not on file  Relationships  . Social connections:    Talks on phone: Not on file    Gets together: Not on file    Attends religious service: Not on file    Active member of club or organization: Not on file    Attends meetings of clubs or organizations: Not on file    Relationship status: Not on file  . Intimate partner violence:     Fear of current or ex partner: Not on file    Emotionally abused: Not on file    Physically abused: Not on file    Forced sexual activity: Not on file  Other Topics Concern  . Not on file  Social History Narrative  . Not on file   Family History  Problem Relation Age of Onset  . Cancer Father        pacemaker  . Bradycardia Father    Current Outpatient Medications  Medication Sig Dispense Refill  . apixaban (ELIQUIS) 5 MG TABS tablet Take 1 tablet (5 mg total) by mouth 2 (two) times daily. 60 tablet 1  . cycloSPORINE (RESTASIS) 0.05 % ophthalmic emulsion Place 1 drop into both eyes 2 (two) times daily as needed (for eye irritation/dryness).    Marland Kitchen diclofenac sodium (VOLTAREN) 1 % GEL Apply 1 g topically 4 (four) times daily as needed (for knee pain.).    Marland Kitchen etodolac (LODINE) 400 MG tablet Take 400 mg by mouth 2 (two) times daily.    . fluticasone (FLONASE) 50 MCG/ACT nasal spray Place 2 sprays into both nostrils at bedtime.     Marland Kitchen lactulose (CHRONULAC) 10 GM/15ML solution Take 10 g by mouth daily.     Marland Kitchen levothyroxine (SYNTHROID, LEVOTHROID) 125 MCG tablet Take 125 mcg by mouth daily before breakfast.   0  . Misc. Devices (ROLLATOR) MISC Use daily with walking 1 each 0  . nadolol (CORGARD) 20 MG tablet Take 20 mg by mouth daily.     Marland Kitchen NARCAN 4 MG/0.1ML LIQD nasal spray kit Place 1 spray into the nose as needed (For accidental opoid overdose).   0  . omega-3 acid ethyl esters (LOVAZA) 1 g capsule Take 2 g by mouth 2 (two) times daily.   0  . oxyCODONE (OXYCONTIN) 40 mg 12 hr tablet Take 1 tablet (40 mg total) by mouth every 12 (twelve) hours. Do not fill until 07/25/2017 60 tablet 0  . Oxycodone HCl 20 MG TABS Take 1 tablet (20 mg total) by mouth every 8 (eight) hours as needed. 90 tablet 0  . spironolactone (ALDACTONE) 100 MG tablet Take 100 mg by mouth 2 (two) times daily.      Marland Kitchen  torsemide (DEMADEX) 20 MG tablet Take 2 tablets (40 mg total) by mouth daily. 60 tablet 11   No current  facility-administered medications for this encounter.    Vitals:   06/20/18 1332  BP: 128/80  Pulse: 68  SpO2: 94%  Weight: (!) 429 lb 9.6 oz (194.9 kg)     Wt Readings from Last 3 Encounters:  06/20/18 (!) 429 lb 9.6 oz (194.9 kg)  03/30/18 (!) 426 lb 12.8 oz (193.6 kg)  01/31/18 (!) 434 lb 8 oz (197.1 kg)   Physical Exam General: Well appearing. No resp difficulty. HEENT: Normal Neck: Supple. JVP difficult due to body habitus. Carotids 2+ bilat; no bruits. No thyromegaly or nodule noted. Cor: PMI nondisplaced. RRR, No M/G/R noted Lungs: Clear, diminished. On O2 Abdomen: Soft, non-tender, non-distended, no HSM. No bruits or masses. +BS  Extremities: No cyanosis, clubbing, or rash. R and LLE wrapped.  Neuro: Alert & orientedx3, cranial nerves grossly intact. moves all 4 extremities w/o difficulty. Affect pleasant  1. Chronic diastolic CHF with prominent RV failure: - Echo most remarkable for pulmonary hypertension and RV failure (11/18).  -Suspect RV failure is triggered by pulmonary hypertension.  Underlying OHS/OSA and COPD have likely caused pulmonary hypertension/RV failure. - NYHA III. Severely limited by body habitus.  - Volume status very difficult. No obvious JVP.   - Continue torsemide 40 mg daily. Can take extra 20 mg in evening as needed. Taking 2-3 times/week - Continue spiro 100 mg BID - Continue UNNA boots. Wounds have healed, so no longer following with wound clinic - Reinforced fluid restriction to < 2 L daily, sodium restriction to less than 2000 mg daily, and the importance of daily weights.   2. Pulmonary hypertension: Severe by echo and RHC (pulmonary arterial hypertension).  Suspect group 3 disease due to COPD (severe obstruction on PFTs) and OSA/OHS.  He is on home oxygen but has severe OSA that is not currently treated.  V/Q scan not suggestive of chronic PEs.  - Continue chronic O2. No change.  - Most likely would not benefit significantly from pulmonary  vasodilators.  3. OHS/OSA:  - Continue chronic O2. No change.  - Continue nightly CPAP.   4. Cirrhosis: Suspected NASH.  Denies ETOH. Cannot totally rule out component of portopulmonary hypertension. He is on spironolactone at high dose.  No change.  5. Atrial fibrillation: Chronic, suspect permanent (present since at least 2015). - Rate controlled. No change.  - Continue Eliquis 5 mg BID. Denies bleeding.  6. Super Morbid Obesity - Body mass index is 58.26 kg/m. - Discussed portion control.   BMET  [place]iana Shore, NP  06/20/2018   Patient seen with NP, agree with the above note. Weight is up 3 lbs.  He remains very limited, short of breath after 20-30 feet walking, uses walker.  Also limited by knee and ankle pain.  On exam, he has lymphedema but also elevated JVP at 10 cm.  - Increase torsemide to 60 mg daily. BMET today and in 10 days.  - Followup 3-4 weeks to reassess volume status.   [name] 06/21/2018

## 2018-06-20 NOTE — Patient Instructions (Signed)
Labs today (will call for abnormal results, otherwise no news is good news)  INCREASE Torsemide to 60 mg (3 Tablets) Once Daily  Labs in 10 days (bmet)  Follow up in [facility] in 3-4 weeks.

## 2018-06-29 ENCOUNTER — Ambulatory Visit (HOSPITAL_COMMUNITY)
Admission: RE | Admit: 2018-06-29 | Discharge: 2018-06-29 | Disposition: A | Discharge status: Home / Self Care | Payer: Medicare Other | Source: Ambulatory Visit | Attending: Internal Medicine | Admitting: Internal Medicine

## 2018-06-29 DIAGNOSIS — I272 Pulmonary hypertension, unspecified: Secondary | ICD-10-CM | POA: Insufficient documentation

## 2018-06-29 LAB — BASIC METABOLIC PANEL
Anion gap: 9 (ref 5–15)
BUN: 25 mg/dL — AB (ref 8–23)
CO2: 27 mmol/L (ref 22–32)
CREATININE: 1.51 mg/dL — AB (ref 0.61–1.24)
Calcium: 9 mg/dL (ref 8.9–10.3)
Chloride: 102 mmol/L (ref 98–111)
GFR calc Af Amer: 55 mL/min — ABNORMAL LOW (ref >60)
GFR calc non Af Amer: 47 mL/min — ABNORMAL LOW (ref >60)
Glucose, Bld: 114 mg/dL — ABNORMAL HIGH (ref 70–99)
Potassium: 5 mmol/L (ref 3.5–5.1)
Sodium: 138 mmol/L (ref 135–145)

## 2018-06-30 DIAGNOSIS — M2550 Pain in unspecified joint: Secondary | ICD-10-CM | POA: Diagnosis not present

## 2018-06-30 DIAGNOSIS — I272 Pulmonary hypertension, unspecified: Secondary | ICD-10-CM | POA: Diagnosis not present

## 2018-06-30 DIAGNOSIS — R0902 Hypoxemia: Secondary | ICD-10-CM | POA: Diagnosis not present

## 2018-06-30 DIAGNOSIS — I5032 Chronic diastolic (congestive) heart failure: Secondary | ICD-10-CM | POA: Diagnosis not present

## 2018-07-11 ENCOUNTER — Ambulatory Visit (HOSPITAL_COMMUNITY)
Admission: RE | Admit: 2018-07-11 | Discharge: 2018-07-11 | Disposition: A | Discharge status: Home / Self Care | Payer: Medicare Other | Source: Ambulatory Visit | Attending: Cardiology | Admitting: Cardiology

## 2018-07-11 ENCOUNTER — Encounter (HOSPITAL_COMMUNITY): Payer: Self-pay

## 2018-07-11 VITALS — BP 140/90 | HR 70 | Wt >= 6400 oz

## 2018-07-11 DIAGNOSIS — Z79899 Other long term (current) drug therapy: Secondary | ICD-10-CM | POA: Diagnosis not present

## 2018-07-11 DIAGNOSIS — K7460 Unspecified cirrhosis of liver: Secondary | ICD-10-CM | POA: Diagnosis not present

## 2018-07-11 DIAGNOSIS — I272 Pulmonary hypertension, unspecified: Secondary | ICD-10-CM | POA: Diagnosis not present

## 2018-07-11 DIAGNOSIS — E662 Morbid (severe) obesity with alveolar hypoventilation: Secondary | ICD-10-CM | POA: Diagnosis not present

## 2018-07-11 DIAGNOSIS — J449 Chronic obstructive pulmonary disease, unspecified: Secondary | ICD-10-CM | POA: Insufficient documentation

## 2018-07-11 DIAGNOSIS — Z79890 Hormone replacement therapy (postmenopausal): Secondary | ICD-10-CM | POA: Insufficient documentation

## 2018-07-11 DIAGNOSIS — Z7951 Long term (current) use of inhaled steroids: Secondary | ICD-10-CM | POA: Diagnosis not present

## 2018-07-11 DIAGNOSIS — I872 Venous insufficiency (chronic) (peripheral): Secondary | ICD-10-CM

## 2018-07-11 DIAGNOSIS — I482 Chronic atrial fibrillation: Secondary | ICD-10-CM | POA: Insufficient documentation

## 2018-07-11 DIAGNOSIS — Z8249 Family history of ischemic heart disease and other diseases of the circulatory system: Secondary | ICD-10-CM | POA: Diagnosis not present

## 2018-07-11 DIAGNOSIS — I5032 Chronic diastolic (congestive) heart failure: Secondary | ICD-10-CM

## 2018-07-11 DIAGNOSIS — Z9981 Dependence on supplemental oxygen: Secondary | ICD-10-CM | POA: Insufficient documentation

## 2018-07-11 DIAGNOSIS — G4733 Obstructive sleep apnea (adult) (pediatric): Secondary | ICD-10-CM | POA: Diagnosis not present

## 2018-07-11 DIAGNOSIS — E785 Hyperlipidemia, unspecified: Secondary | ICD-10-CM | POA: Diagnosis not present

## 2018-07-11 DIAGNOSIS — Z7901 Long term (current) use of anticoagulants: Secondary | ICD-10-CM | POA: Diagnosis not present

## 2018-07-11 DIAGNOSIS — Z87891 Personal history of nicotine dependence: Secondary | ICD-10-CM | POA: Insufficient documentation

## 2018-07-11 DIAGNOSIS — Z6841 Body Mass Index (BMI) 40.0 and over, adult: Secondary | ICD-10-CM | POA: Insufficient documentation

## 2018-07-11 DIAGNOSIS — F329 Major depressive disorder, single episode, unspecified: Secondary | ICD-10-CM | POA: Insufficient documentation

## 2018-07-11 DIAGNOSIS — I481 Persistent atrial fibrillation: Secondary | ICD-10-CM | POA: Diagnosis not present

## 2018-07-11 DIAGNOSIS — I4819 Other persistent atrial fibrillation: Secondary | ICD-10-CM

## 2018-07-11 LAB — BASIC METABOLIC PANEL
Anion gap: 11 (ref 5–15)
BUN: 31 mg/dL — ABNORMAL HIGH (ref 8–23)
CALCIUM: 9.4 mg/dL (ref 8.9–10.3)
CHLORIDE: 106 mmol/L (ref 98–111)
CO2: 26 mmol/L (ref 22–32)
Creatinine, Ser: 1.37 mg/dL — ABNORMAL HIGH (ref 0.61–1.24)
GFR calc Af Amer: >60 mL/min (ref >60)
GFR calc non Af Amer: 53 mL/min — ABNORMAL LOW (ref >60)
Glucose, Bld: 110 mg/dL — ABNORMAL HIGH (ref 70–99)
Potassium: 4.4 mmol/L (ref 3.5–5.1)
SODIUM: 143 mmol/L (ref 135–145)

## 2018-07-11 NOTE — Progress Notes (Signed)
Advanced Heart Failure Clinic Note   PCP: Dr. Lynnda Child Cardiology: Dr. Martinique HF Cardiology: Dr. Lynelle Doctor is a 62 y.o. male with permanent atrial fibrillation, NASH with cirrhosis, suspected OHS/OSA, and chronic diastolic CHF/RV failure was referred by Dr. Martinique for evaluation of CHF.   Patient has had atrial fibrillation since at least 2015. He has had problems with peripheral edema x years. He has also had some degree of dyspnea for a long time.  However, towards the end of 2018, dyspnea became markedly worse.  At last appointment, I started him on home oxygen.   Echo was done in 11/18 showing EF 50-55%, septal flattening, severe RV dilation, PASP 77 mmHg. LHC/RHC in 1/19 showed no CAD, evidence for RV failure and severe pulmonary hypertension.  PFTs in 1/19 showed severe COPD.  V/Q scan in 1/19 showed no evidence for chronic PEs.  Sleep study showed severe OSA.    He presents today for regular follow up. Last visit torsemide increased. Hasn't noticed much of a difference. He denies any dizziness or lightheadedness. Has SOB with moderate exertion, but none with ADLs. He sleeps on 3-4 pillows chronically and uses his CPAP every night. Chronic leg wraps with lymphedema that he changes himself. On 2 L O2. No bleeding on eliquis. Weight at home ~425 lbs. He states it has "levelled out" and he would like to maintain weight for "a while" before trying to lose more. Taking all medications as directed.   Review of systems complete and found to be negative unless listed in HPI.    PMH: 1. Non-hodgkins lymphoma: follicular small cell lymphoma.  S/p treatment, in clinical remission.  2. Cirrhosis: Suspect NASH.  - Abdominal US (10/18): Suggestive of cirrhosis.  3. Hyperlipidemia.  4. Obesity 5. Atrial fibrillation: Suspected permanent. He appears to have been in atrial fibrillation at least back to 2015.  6. Depression 7. Venous insufficiency 8. OHS/OSA: Sleep study with severe  OSA.  9. H/o vertebral osteomyelitis.  10. OA knees 11. Chronic diastolic CHF/RV failure: Echo (11/18) with EF 50-55%, septal flattening, RV severely dilated with normal systolic function, PASP 77 mmHg, moderate TR.  12. Pulmonary hypertension: Suspect due to OHS/OSA and COPD.  - RHC (1/19): mean RA 20, PA 87/37 mean 52, mean PCWP 16, CI 2.12, PVR 5.6 WU.  - V/Q scan 1/19 with no evidence for chronic PE.                                                                                                                 13. Coronary angiography (1/19): No significant CAD.  14. COPD: PFTs in 1/19 suggested severe COPD.   Social History   Socioeconomic History  . Marital status: Divorced    Spouse name: Not on file  . Number of children: 2  . Years of education: Not on file  . Highest education level: Not on file  Occupational History  . Occupation: disabled  Social Needs  . Financial resource strain: Not on file  .  Food insecurity:    Worry: Not on file    Inability: Not on file  . Transportation needs:    Medical: Not on file    Non-medical: Not on file  Tobacco Use  . Smoking status: Former Smoker    Packs/day: 0.50    Years: 8.00    Pack years: 4.00    Types: Cigarettes    Start date: 09/26/1966    Last attempt to quit: 06/26/1989    Years since quitting: 29.0  . Smokeless tobacco: Never Used  . Tobacco comment: quit 38 years ago  Substance and Sexual Activity  . Alcohol use: No    Alcohol/week: 0.0 standard drinks  . Drug use: No  . Sexual activity: Not on file  Lifestyle  . Physical activity:    Days per week: Not on file    Minutes per session: Not on file  . Stress: Not on file  Relationships  . Social connections:    Talks on phone: Not on file    Gets together: Not on file    Attends religious service: Not on file    Active member of club or organization: Not on file    Attends meetings of clubs or organizations: Not on file    Relationship status: Not on file    . Intimate partner violence:    Fear of current or ex partner: Not on file    Emotionally abused: Not on file    Physically abused: Not on file    Forced sexual activity: Not on file  Other Topics Concern  . Not on file  Social History Narrative  . Not on file   Family History  Problem Relation Age of Onset  . Cancer Father        pacemaker  . Bradycardia Father    Current Outpatient Medications  Medication Sig Dispense Refill  . apixaban (ELIQUIS) 5 MG TABS tablet Take 1 tablet (5 mg total) by mouth 2 (two) times daily. 60 tablet 1  . cycloSPORINE (RESTASIS) 0.05 % ophthalmic emulsion Place 1 drop into both eyes 2 (two) times daily as needed (for eye irritation/dryness).    Marland Kitchen diclofenac sodium (VOLTAREN) 1 % GEL Apply 1 g topically 4 (four) times daily as needed (for knee pain.).    Marland Kitchen etodolac (LODINE) 400 MG tablet Take 400 mg by mouth 2 (two) times daily.    . fluticasone (FLONASE) 50 MCG/ACT nasal spray Place 2 sprays into both nostrils at bedtime.     Marland Kitchen lactulose (CHRONULAC) 10 GM/15ML solution Take 10 g by mouth daily.     Marland Kitchen levothyroxine (SYNTHROID, LEVOTHROID) 125 MCG tablet Take 125 mcg by mouth daily before breakfast.   0  . Misc. Devices (ROLLATOR) MISC Use daily with walking 1 each 0  . nadolol (CORGARD) 20 MG tablet Take 20 mg by mouth daily.     Marland Kitchen NARCAN 4 MG/0.1ML LIQD nasal spray kit Place 1 spray into the nose as needed (For accidental opoid overdose).   0  . omega-3 acid ethyl esters (LOVAZA) 1 g capsule Take 2 g by mouth 2 (two) times daily.   0  . oxyCODONE (OXYCONTIN) 40 mg 12 hr tablet Take 1 tablet (40 mg total) by mouth every 12 (twelve) hours. Do not fill until 07/25/2017 60 tablet 0  . Oxycodone HCl 20 MG TABS Take 1 tablet (20 mg total) by mouth every 8 (eight) hours as needed. 90 tablet 0  . spironolactone (ALDACTONE) 100 MG tablet Take  100 mg by mouth 2 (two) times daily.      Marland Kitchen torsemide (DEMADEX) 20 MG tablet Take 3 tablets (60 mg total) by mouth daily.  90 tablet 11   No current facility-administered medications for this encounter.    Vitals:   07/11/18 1403  BP: (!) 158/90  Pulse: 70  SpO2: (!) 87%  Weight: (!) 196.3 kg (432 lb 12.8 oz)     Wt Readings from Last 3 Encounters:  07/11/18 (!) 196.3 kg (432 lb 12.8 oz)  06/20/18 (!) 194.9 kg (429 lb 9.6 oz)  03/30/18 (!) 193.6 kg (426 lb 12.8 oz)   Physical Exam General: Obese. Well appearing. No resp difficulty. HEENT: Normal Neck: Supple. JVP difficult, but appears to be around 6-8 cm. Carotids 2+ bilat; no bruits. No thyromegaly or nodule noted. Cor: PMI nondisplaced. RRR, No M/G/R noted Lungs: CTAB, normal effort. On chronic O2.  Abdomen: Morbidly obese, soft, non-tender, non-distended, no HSM. No bruits or masses. +BS  Extremities: No cyanosis, clubbing, or rash. Chronic 2+ lymphedema into thighs.   Neuro: Alert & orientedx3, cranial nerves grossly intact. moves all 4 extremities w/o difficulty. Affect pleasant   1. Chronic diastolic CHF with prominent RV failure: - Echo most remarkable for pulmonary hypertension and RV failure (11/18).  -Suspect RV failure is triggered by pulmonary hypertension.  Underlying OHS/OSA and COPD have likely caused pulmonary hypertension/RV failure. - NYHA III chronically - Volume status very difficult, but does not appear elevated on exam.   - Continue torsemide 60 mg daily.  - Continue spiro 100 mg BID - Continue UNNA boots. Wounds have healed, so no longer following with wound clinic - Reinforced fluid restriction to < 2 L daily, sodium restriction to less than 2000 mg daily, and the importance of daily weights.   2. Pulmonary hypertension: Severe by echo and RHC (pulmonary arterial hypertension).  Suspect group 3 disease due to COPD (severe obstruction on PFTs) and OSA/OHS.  He is on home oxygen but has severe OSA that is not currently treated.  V/Q scan not suggestive of chronic PEs.  - Continue chronic O2. No change - Most likely would not  benefit significantly from pulmonary vasodilators.  3. OHS/OSA:  - Continue chronic O2. No change.  - Continue nightly CPAP.   4. Cirrhosis: Suspected NASH.  Denies ETOH. Cannot totally rule out component of portopulmonary hypertension. He is on spironolactone at high dose.  No change.  5. Atrial fibrillation: Chronic, suspect permanent (present since at least 2015). - Rate controlled. No change.   - Continue Eliquis 5 mg BID. Denies bleeding.  6. Super Morbid Obesity - Body mass index is 58.7 kg/m. - Discussed portion control.   Doing well overall. Had discussion about continued weight loss, but pt prefers to "play it be ear" for now and do things himself. Encouraged activity and diet. Volume status OK. Labs today. RTC 3 months, sooner with symptoms.   Shirley Friar, PA-C  07/11/2018   Greater than 50% of the 25 minute visit was spent in counseling/coordination of care regarding disease state education, salt/fluid restriction, sliding scale diuretics, and medication compliance.

## 2018-07-11 NOTE — Patient Instructions (Signed)
Labs today (will call for abnormal results, otherwise no news is good news)  Follow up with Dr. Aundra Dubin in 3 months.

## 2018-07-12 ENCOUNTER — Other Ambulatory Visit: Payer: Self-pay | Admitting: *Deleted

## 2018-07-12 DIAGNOSIS — Z8572 Personal history of non-Hodgkin lymphomas: Secondary | ICD-10-CM

## 2018-07-12 MED ORDER — OXYCODONE HCL 20 MG PO TABS: 20.0000 mg | ORAL_TABLET | Freq: Three times a day (TID) | ORAL | 0 refills | Status: DC | PRN

## 2018-07-12 MED ORDER — OXYCODONE HCL ER 40 MG PO T12A: 40.0000 mg | EXTENDED_RELEASE_TABLET | Freq: Two times a day (BID) | ORAL | 0 refills | Status: DC

## 2018-07-18 ENCOUNTER — Ambulatory Visit (INDEPENDENT_AMBULATORY_CARE_PROVIDER_SITE_OTHER): Payer: Medicare Other | Admitting: Podiatry

## 2018-07-18 ENCOUNTER — Encounter: Payer: Self-pay | Admitting: Podiatry

## 2018-07-18 DIAGNOSIS — D689 Coagulation defect, unspecified: Secondary | ICD-10-CM | POA: Diagnosis not present

## 2018-07-18 DIAGNOSIS — B351 Tinea unguium: Secondary | ICD-10-CM

## 2018-07-18 DIAGNOSIS — M79676 Pain in unspecified toe(s): Secondary | ICD-10-CM | POA: Diagnosis not present

## 2018-07-18 NOTE — Progress Notes (Signed)
Patient ID: Albert James, male   DOB: 07-22-1956, 62 y.o.   MRN: [id number] Complaint:  Visit Type: Patient returns to my office for continued preventative foot care services. Complaint: Patient states" my nails have grown long and thick and become painful to walk and wear shoes" Patient is obese and unable to get to his feet. The patient presents for preventative foot care services. No changes to ROS.  Patient is on eliquiss.  Podiatric Exam: Vascular: dorsalis pedis and posterior tibial pulses are not  palpable bilateral. Capillary return is immediate. Temperature gradient is WNL. Skin turgor WNL  Sensorium: Normal Semmes Weinstein monofilament test. Normal tactile sensation bilaterally. Nail Exam: Pt has thick disfigured discolored nails with subungual debris noted bilateral entire nail hallux through fifth toenails Ulcer Exam: There is no evidence of ulcer or pre-ulcerative changes or infection. Orthopedic Exam: Muscle tone and strength are WNL. No limitations in general ROM. No crepitus or effusions noted. Foot type and digits show no abnormalities. Bony prominences are unremarkable. Severe swelling in legs and feet. Skin: No Porokeratosis. No infection or ulcers  Diagnosis:  Onychomycosis, , Pain in right toe, pain in left toes  Treatment & Plan Procedures and Treatment: Consent by patient was obtained for treatment procedures. The patient understood the discussion of treatment and procedures well. All questions were answered thoroughly reviewed. Debridement of mycotic and hypertrophic toenails, 1 through 5 bilateral and clearing of subungual debris. No ulceration, no infection noted.  Return Visit-Office Procedure: Patient instructed to return to the office for a follow up visit 10 weeks  for continued evaluation and treatment.    Gardiner Barefoot DPM

## 2018-07-27 ENCOUNTER — Encounter: Payer: Self-pay | Admitting: Hematology & Oncology

## 2018-07-27 ENCOUNTER — Inpatient Hospital Stay: Payer: Medicare Other

## 2018-07-27 ENCOUNTER — Other Ambulatory Visit: Payer: Self-pay

## 2018-07-27 ENCOUNTER — Inpatient Hospital Stay: Payer: Medicare Other | Attending: Hematology & Oncology | Admitting: Hematology & Oncology

## 2018-07-27 VITALS — BP 106/67 | HR 70 | Temp 98.3°F | Resp 20 | Wt >= 6400 oz

## 2018-07-27 DIAGNOSIS — Z7901 Long term (current) use of anticoagulants: Secondary | ICD-10-CM

## 2018-07-27 DIAGNOSIS — Z9981 Dependence on supplemental oxygen: Secondary | ICD-10-CM | POA: Diagnosis not present

## 2018-07-27 DIAGNOSIS — M4620 Osteomyelitis of vertebra, site unspecified: Secondary | ICD-10-CM

## 2018-07-27 DIAGNOSIS — Z8572 Personal history of non-Hodgkin lymphomas: Secondary | ICD-10-CM

## 2018-07-27 DIAGNOSIS — I509 Heart failure, unspecified: Secondary | ICD-10-CM

## 2018-07-27 DIAGNOSIS — I4891 Unspecified atrial fibrillation: Secondary | ICD-10-CM

## 2018-07-27 DIAGNOSIS — J449 Chronic obstructive pulmonary disease, unspecified: Secondary | ICD-10-CM

## 2018-07-27 DIAGNOSIS — I890 Lymphedema, not elsewhere classified: Secondary | ICD-10-CM | POA: Diagnosis not present

## 2018-07-27 LAB — CBC WITH DIFFERENTIAL (CANCER CENTER ONLY)
BASOS ABS: 0 10*3/uL (ref 0.0–0.1)
Basophils Relative: 0 %
EOS PCT: 1 %
Eosinophils Absolute: 0.1 10*3/uL (ref 0.0–0.5)
HEMATOCRIT: 46.6 % (ref 38.7–49.9)
HEMOGLOBIN: 14.7 g/dL (ref 13.0–17.1)
LYMPHS PCT: 13 %
Lymphs Abs: 0.9 10*3/uL (ref 0.9–3.3)
MCH: 30.7 pg (ref 28.0–33.4)
MCHC: 31.5 g/dL — ABNORMAL LOW (ref 32.0–35.9)
MCV: 97.3 fL (ref 82.0–98.0)
Monocytes Absolute: 0.7 10*3/uL (ref 0.1–0.9)
Monocytes Relative: 9 %
NEUTROS ABS: 5.4 10*3/uL (ref 1.5–6.5)
NEUTROS PCT: 77 %
Platelet Count: 96 10*3/uL — ABNORMAL LOW (ref 145–400)
RBC: 4.79 MIL/uL (ref 4.20–5.70)
RDW: 18.7 % — AB (ref 11.1–15.7)
WBC: 7.1 10*3/uL (ref 4.0–10.0)

## 2018-07-27 LAB — CMP (CANCER CENTER ONLY)
ALK PHOS: 80 U/L (ref 26–84)
ALT: 20 U/L (ref 10–47)
AST: 22 U/L (ref 11–38)
Albumin: 3.6 g/dL (ref 3.5–5.0)
Anion gap: 5 (ref 5–15)
BILIRUBIN TOTAL: 1.1 mg/dL (ref 0.2–1.6)
BUN: 29 mg/dL — ABNORMAL HIGH (ref 7–22)
CALCIUM: 9.1 mg/dL (ref 8.0–10.3)
CO2: 30 mmol/L (ref 18–33)
Chloride: 105 mmol/L (ref 98–108)
Creatinine: 1.6 mg/dL — ABNORMAL HIGH (ref 0.60–1.20)
Glucose, Bld: 119 mg/dL — ABNORMAL HIGH (ref 73–118)
Potassium: 5.2 mmol/L — ABNORMAL HIGH (ref 3.3–4.7)
Sodium: 140 mmol/L (ref 128–145)
Total Protein: 6.2 g/dL — ABNORMAL LOW (ref 6.4–8.1)

## 2018-07-27 LAB — LACTATE DEHYDROGENASE: LDH: 218 U/L — AB (ref 98–192)

## 2018-07-27 NOTE — Progress Notes (Signed)
Hematology and Oncology Follow Up Visit  ELIZAR ALPERN [id number] 1956-01-27 62 y.o. 07/27/2018   Principle Diagnosis:  1. Follicular small cell non-Hodgkin lymphoma, clinical remission. 2. Morbid obesity. 3. Lymphedema of his legs. 4. Vertebral osteomyelitis.   Current Therapy:    Observation     Interim History:  Mr.  Edelson is back for followup. We see him every 6 months.  He now comes in with supplemental oxygen.  He apparently has congestive heart failure.  He has atrial fibrillation.  He does see cardiology.  He is also on Eliquis now.  He has had no bleeding.  He has had past tobacco use.  He also may be an issue with underlying COPD.  He does have bad degenerative disease in the spine.  He had a history of vertebral osteomyelitis.  He is on OxyContin and oxycodone.  These have helped him tremendously.  He is trying to watch what he eats.  Again he wants to lose more weight.  He really wants to come off the oxygen.  Overall, his performance status is ECOG 3.   Medications:  Current Outpatient Medications:  .  apixaban (ELIQUIS) 5 MG TABS tablet, Take 1 tablet (5 mg total) by mouth 2 (two) times daily., Disp: 60 tablet, Rfl: 1 .  cycloSPORINE (RESTASIS) 0.05 % ophthalmic emulsion, Place 1 drop into both eyes 2 (two) times daily as needed (for eye irritation/dryness)., Disp: , Rfl:  .  diclofenac sodium (VOLTAREN) 1 % GEL, Apply 1 g topically 4 (four) times daily as needed (for knee pain.)., Disp: , Rfl:  .  etodolac (LODINE) 400 MG tablet, Take 400 mg by mouth 2 (two) times daily., Disp: , Rfl:  .  fluticasone (FLONASE) 50 MCG/ACT nasal spray, Place 2 sprays into both nostrils at bedtime. , Disp: , Rfl:  .  lactulose (CHRONULAC) 10 GM/15ML solution, Take 10 g by mouth daily. , Disp: , Rfl:  .  levothyroxine (SYNTHROID, LEVOTHROID) 125 MCG tablet, Take 125 mcg by mouth daily before breakfast. , Disp: , Rfl: 0 .  Misc. Devices (ROLLATOR) MISC, Use daily with  walking, Disp: 1 each, Rfl: 0 .  nadolol (CORGARD) 20 MG tablet, Take 20 mg by mouth daily. , Disp: , Rfl:  .  NARCAN 4 MG/0.1ML LIQD nasal spray kit, Place 1 spray into the nose as needed (For accidental opoid overdose). , Disp: , Rfl: 0 .  omega-3 acid ethyl esters (LOVAZA) 1 g capsule, Take 2 g by mouth 2 (two) times daily. , Disp: , Rfl: 0 .  oxyCODONE (OXYCONTIN) 40 mg 12 hr tablet, Take 1 tablet (40 mg total) by mouth every 12 (twelve) hours. Do not fill until 07/25/2017, Disp: 60 tablet, Rfl: 0 .  Oxycodone HCl 20 MG TABS, Take 1 tablet (20 mg total) by mouth every 8 (eight) hours as needed., Disp: 90 tablet, Rfl: 0 .  spironolactone (ALDACTONE) 100 MG tablet, Take 100 mg by mouth 2 (two) times daily.  , Disp: , Rfl:  .  torsemide (DEMADEX) 20 MG tablet, Take 3 tablets (60 mg total) by mouth daily., Disp: 90 tablet, Rfl: 11  Allergies:  Allergies  Allergen Reactions  . Vancomycin Other (See Comments)    "red man" syndrome (when in large quantity)    Past Medical History, Surgical history, Social history, and Family History were reviewed and updated.  Review of Systems: Review of Systems  Constitutional: Negative.   HENT: Negative.   Eyes: Negative.   Respiratory: Positive for shortness  of breath.   Cardiovascular: Positive for palpitations and leg swelling.  Gastrointestinal: Positive for nausea.  Genitourinary: Negative.   Musculoskeletal: Positive for back pain.  Skin: Negative.   Neurological: Negative.   Endo/Heme/Allergies: Negative.   Psychiatric/Behavioral: Negative.      Physical Exam:  weight is 440 lb (199.6 kg) (abnormal). His oral temperature is 98.3 F (36.8 C). His blood pressure is 106/67 and his pulse is 70. His respiration is 20 and oxygen saturation is 94%.   Physical Exam  Lab Results  Component Value Date   WBC 7.1 07/27/2018   HGB 14.7 07/27/2018   HCT 46.6 07/27/2018   MCV 97.3 07/27/2018   PLT 96 (L) 07/27/2018     Chemistry        Component Value Date/Time   NA 140 07/27/2018 1314   NA 140 07/29/2017 1311   NA 142 01/26/2017 1309   K 5.2 (H) 07/27/2018 1314   K 4.3 07/29/2017 1311   K 4.0 01/26/2017 1309   CL 105 07/27/2018 1314   CL 102 07/29/2017 1311   CL 100 01/26/2017 1309   CO2 30 07/27/2018 1314   CO2 30 (H) 07/29/2017 1311   CO2 30 01/26/2017 1309   BUN 29 (H) 07/27/2018 1314   BUN 15 07/29/2017 1311   BUN 15 01/26/2017 1309   CREATININE 1.60 (H) 07/27/2018 1314   CREATININE 1.04 07/29/2017 1311   CREATININE 1.1 01/26/2017 1309      Component Value Date/Time   CALCIUM 9.1 07/27/2018 1314   CALCIUM 9.4 07/29/2017 1311   CALCIUM 9.4 01/26/2017 1309   ALKPHOS 80 07/27/2018 1314   ALKPHOS 88 07/29/2017 1311   ALKPHOS 84 01/26/2017 1309   AST 22 07/27/2018 1314   ALT 20 07/27/2018 1314   ALT 23 01/26/2017 1309   BILITOT 1.1 07/27/2018 1314         Impression and Plan: Mr. Barnhard is 62 year old white male. He has a remote history of follicular small cell non-Hodgkin's lymphoma. This really has not been an issue for him for probably 14 years.  He will be involved with the massive lawsuit against [employer] for Roundup as he has had lymphoma and used quite a bit of round up when he was younger.  Hopefully, he will  lose weight.  Hopefully, he will be able to get off the oxygen.  He has right-sided heart failure so I am not sure how much better this will actually get.  I will see him back in 6 months.  Volanda Napoleon, MD 9/12/20191:57 PM

## 2018-07-31 DIAGNOSIS — I5032 Chronic diastolic (congestive) heart failure: Secondary | ICD-10-CM | POA: Diagnosis not present

## 2018-07-31 DIAGNOSIS — M2550 Pain in unspecified joint: Secondary | ICD-10-CM | POA: Diagnosis not present

## 2018-07-31 DIAGNOSIS — R0902 Hypoxemia: Secondary | ICD-10-CM | POA: Diagnosis not present

## 2018-07-31 DIAGNOSIS — I272 Pulmonary hypertension, unspecified: Secondary | ICD-10-CM | POA: Diagnosis not present

## 2018-08-14 ENCOUNTER — Other Ambulatory Visit (HOSPITAL_COMMUNITY): Payer: Self-pay

## 2018-08-14 MED ORDER — APIXABAN 5 MG PO TABS: 5.0000 mg | ORAL_TABLET | Freq: Two times a day (BID) | ORAL | 3 refills | Status: DC

## 2018-08-16 ENCOUNTER — Other Ambulatory Visit: Payer: Self-pay | Admitting: *Deleted

## 2018-08-16 DIAGNOSIS — Z8572 Personal history of non-Hodgkin lymphomas: Secondary | ICD-10-CM

## 2018-08-16 MED ORDER — OXYCODONE HCL 20 MG PO TABS: 20.0000 mg | ORAL_TABLET | Freq: Three times a day (TID) | ORAL | 0 refills | Status: DC | PRN

## 2018-08-16 MED ORDER — OXYCODONE HCL ER 40 MG PO T12A: 40.0000 mg | EXTENDED_RELEASE_TABLET | Freq: Two times a day (BID) | ORAL | 0 refills | Status: DC

## 2018-08-30 DIAGNOSIS — R0902 Hypoxemia: Secondary | ICD-10-CM | POA: Diagnosis not present

## 2018-08-30 DIAGNOSIS — I5032 Chronic diastolic (congestive) heart failure: Secondary | ICD-10-CM | POA: Diagnosis not present

## 2018-08-30 DIAGNOSIS — I272 Pulmonary hypertension, unspecified: Secondary | ICD-10-CM | POA: Diagnosis not present

## 2018-08-30 DIAGNOSIS — M2550 Pain in unspecified joint: Secondary | ICD-10-CM | POA: Diagnosis not present

## 2018-09-11 ENCOUNTER — Other Ambulatory Visit: Payer: Self-pay | Admitting: *Deleted

## 2018-09-11 DIAGNOSIS — Z8572 Personal history of non-Hodgkin lymphomas: Secondary | ICD-10-CM

## 2018-09-11 MED ORDER — OXYCODONE HCL 20 MG PO TABS: 20.0000 mg | ORAL_TABLET | Freq: Three times a day (TID) | ORAL | 0 refills | Status: DC | PRN

## 2018-09-11 MED ORDER — OXYCODONE HCL ER 40 MG PO T12A: 40.0000 mg | EXTENDED_RELEASE_TABLET | Freq: Two times a day (BID) | ORAL | 0 refills | Status: DC

## 2018-09-25 ENCOUNTER — Other Ambulatory Visit: Payer: Self-pay | Admitting: Gastroenterology

## 2018-09-25 DIAGNOSIS — K7460 Unspecified cirrhosis of liver: Secondary | ICD-10-CM

## 2018-09-26 ENCOUNTER — Encounter: Payer: Self-pay | Admitting: Podiatry

## 2018-09-26 ENCOUNTER — Ambulatory Visit (INDEPENDENT_AMBULATORY_CARE_PROVIDER_SITE_OTHER): Payer: Medicare Other | Admitting: Podiatry

## 2018-09-26 DIAGNOSIS — B351 Tinea unguium: Secondary | ICD-10-CM | POA: Diagnosis not present

## 2018-09-26 DIAGNOSIS — M79676 Pain in unspecified toe(s): Secondary | ICD-10-CM | POA: Diagnosis not present

## 2018-09-26 DIAGNOSIS — D689 Coagulation defect, unspecified: Secondary | ICD-10-CM | POA: Diagnosis not present

## 2018-09-26 NOTE — Progress Notes (Signed)
Patient ID: Albert James, male   DOB: [birth year], 62 y.o.   MRN: [id number] Complaint:  Visit Type: Patient returns to my office for continued preventative foot care services. Complaint: Patient states" my nails have grown long and thick and become painful to walk and wear shoes" Patient is obese and unable to get to his feet. The patient presents for preventative foot care services. No changes to ROS.  Patient is on eliquiss.  Podiatric Exam: Vascular: dorsalis pedis and posterior tibial pulses are not  palpable bilateral. Capillary return is immediate. Temperature gradient is WNL. Skin turgor WNL  Sensorium: Normal Semmes Weinstein monofilament test. Normal tactile sensation bilaterally. Nail Exam: Pt has thick disfigured discolored nails with subungual debris noted bilateral entire nail hallux through fifth toenails Ulcer Exam: There is no evidence of ulcer or pre-ulcerative changes or infection. Orthopedic Exam: Muscle tone and strength are WNL. No limitations in general ROM. No crepitus or effusions noted. Foot type and digits show no abnormalities. Bony prominences are unremarkable. Severe swelling in legs and feet. Skin: No Porokeratosis. No infection or ulcers  Diagnosis:  Onychomycosis, , Pain in right toe, pain in left toes  Treatment & Plan Procedures and Treatment: Consent by patient was obtained for treatment procedures. The patient understood the discussion of treatment and procedures well. All questions were answered thoroughly reviewed. Debridement of mycotic and hypertrophic toenails, 1 through 5 bilateral and clearing of subungual debris. No ulceration, no infection noted.  Return Visit-Office Procedure: Patient instructed to return to the office for a follow up visit 10 weeks  for continued evaluation and treatment.    Gardiner Barefoot DPM

## 2018-09-28 ENCOUNTER — Encounter: Payer: Self-pay | Admitting: Cardiology

## 2018-09-28 DIAGNOSIS — J9691 Respiratory failure, unspecified with hypoxia: Secondary | ICD-10-CM | POA: Diagnosis not present

## 2018-09-28 DIAGNOSIS — I270 Primary pulmonary hypertension: Secondary | ICD-10-CM | POA: Diagnosis not present

## 2018-09-28 DIAGNOSIS — I5030 Unspecified diastolic (congestive) heart failure: Secondary | ICD-10-CM | POA: Diagnosis not present

## 2018-09-28 DIAGNOSIS — K7460 Unspecified cirrhosis of liver: Secondary | ICD-10-CM | POA: Diagnosis not present

## 2018-09-30 DIAGNOSIS — I272 Pulmonary hypertension, unspecified: Secondary | ICD-10-CM | POA: Diagnosis not present

## 2018-09-30 DIAGNOSIS — R0902 Hypoxemia: Secondary | ICD-10-CM | POA: Diagnosis not present

## 2018-09-30 DIAGNOSIS — I5032 Chronic diastolic (congestive) heart failure: Secondary | ICD-10-CM | POA: Diagnosis not present

## 2018-09-30 DIAGNOSIS — M2550 Pain in unspecified joint: Secondary | ICD-10-CM | POA: Diagnosis not present

## 2018-10-02 ENCOUNTER — Ambulatory Visit
Admission: RE | Admit: 2018-10-02 | Discharge: 2018-10-02 | Disposition: A | Discharge status: Home / Self Care | Payer: Medicare Other | Source: Ambulatory Visit | Attending: Gastroenterology | Admitting: Gastroenterology

## 2018-10-02 DIAGNOSIS — N281 Cyst of kidney, acquired: Secondary | ICD-10-CM | POA: Diagnosis not present

## 2018-10-02 DIAGNOSIS — K7460 Unspecified cirrhosis of liver: Secondary | ICD-10-CM | POA: Diagnosis not present

## 2018-10-03 DIAGNOSIS — K7460 Unspecified cirrhosis of liver: Secondary | ICD-10-CM | POA: Diagnosis not present

## 2018-10-03 DIAGNOSIS — R1084 Generalized abdominal pain: Secondary | ICD-10-CM | POA: Diagnosis not present

## 2018-10-10 ENCOUNTER — Other Ambulatory Visit: Payer: Self-pay | Admitting: *Deleted

## 2018-10-10 DIAGNOSIS — Z8572 Personal history of non-Hodgkin lymphomas: Secondary | ICD-10-CM

## 2018-10-10 DIAGNOSIS — M549 Dorsalgia, unspecified: Principal | ICD-10-CM

## 2018-10-10 DIAGNOSIS — G8929 Other chronic pain: Secondary | ICD-10-CM

## 2018-10-11 MED ORDER — OXYCODONE HCL ER 40 MG PO T12A: 40.0000 mg | EXTENDED_RELEASE_TABLET | Freq: Two times a day (BID) | ORAL | 0 refills | Status: DC

## 2018-10-11 MED ORDER — OXYCODONE HCL 20 MG PO TABS: 20.0000 mg | ORAL_TABLET | Freq: Three times a day (TID) | ORAL | 0 refills | Status: DC | PRN

## 2018-10-17 ENCOUNTER — Ambulatory Visit (HOSPITAL_COMMUNITY)
Admission: RE | Admit: 2018-10-17 | Discharge: 2018-10-17 | Disposition: A | Discharge status: Home / Self Care | Payer: Medicare Other | Source: Ambulatory Visit | Attending: Cardiology | Admitting: Cardiology

## 2018-10-17 ENCOUNTER — Telehealth (HOSPITAL_COMMUNITY): Payer: Self-pay | Admitting: *Deleted

## 2018-10-17 VITALS — BP 110/74 | HR 70 | Wt >= 6400 oz

## 2018-10-17 DIAGNOSIS — Z6841 Body Mass Index (BMI) 40.0 and over, adult: Secondary | ICD-10-CM | POA: Insufficient documentation

## 2018-10-17 DIAGNOSIS — E785 Hyperlipidemia, unspecified: Secondary | ICD-10-CM | POA: Diagnosis not present

## 2018-10-17 DIAGNOSIS — I872 Venous insufficiency (chronic) (peripheral): Secondary | ICD-10-CM | POA: Diagnosis not present

## 2018-10-17 DIAGNOSIS — I272 Pulmonary hypertension, unspecified: Secondary | ICD-10-CM | POA: Insufficient documentation

## 2018-10-17 DIAGNOSIS — I5032 Chronic diastolic (congestive) heart failure: Secondary | ICD-10-CM | POA: Insufficient documentation

## 2018-10-17 DIAGNOSIS — Z7901 Long term (current) use of anticoagulants: Secondary | ICD-10-CM | POA: Insufficient documentation

## 2018-10-17 DIAGNOSIS — N183 Chronic kidney disease, stage 3 (moderate): Secondary | ICD-10-CM | POA: Diagnosis not present

## 2018-10-17 DIAGNOSIS — I5022 Chronic systolic (congestive) heart failure: Secondary | ICD-10-CM

## 2018-10-17 DIAGNOSIS — F329 Major depressive disorder, single episode, unspecified: Secondary | ICD-10-CM | POA: Diagnosis not present

## 2018-10-17 DIAGNOSIS — K7460 Unspecified cirrhosis of liver: Secondary | ICD-10-CM | POA: Insufficient documentation

## 2018-10-17 DIAGNOSIS — I4819 Other persistent atrial fibrillation: Secondary | ICD-10-CM | POA: Diagnosis not present

## 2018-10-17 DIAGNOSIS — I4891 Unspecified atrial fibrillation: Secondary | ICD-10-CM | POA: Insufficient documentation

## 2018-10-17 DIAGNOSIS — Z79899 Other long term (current) drug therapy: Secondary | ICD-10-CM | POA: Diagnosis not present

## 2018-10-17 DIAGNOSIS — Z87891 Personal history of nicotine dependence: Secondary | ICD-10-CM | POA: Diagnosis not present

## 2018-10-17 DIAGNOSIS — J449 Chronic obstructive pulmonary disease, unspecified: Secondary | ICD-10-CM | POA: Diagnosis not present

## 2018-10-17 DIAGNOSIS — G4733 Obstructive sleep apnea (adult) (pediatric): Secondary | ICD-10-CM | POA: Insufficient documentation

## 2018-10-17 LAB — BASIC METABOLIC PANEL
ANION GAP: 16 — AB (ref 5–15)
BUN: 59 mg/dL — ABNORMAL HIGH (ref 8–23)
CO2: 19 mmol/L — AB (ref 22–32)
CREATININE: 2.54 mg/dL — AB (ref 0.61–1.24)
Calcium: 8.8 mg/dL — ABNORMAL LOW (ref 8.9–10.3)
Chloride: 95 mmol/L — ABNORMAL LOW (ref 98–111)
GFR calc non Af Amer: 26 mL/min — ABNORMAL LOW (ref >60)
GFR, EST AFRICAN AMERICAN: 30 mL/min — AB (ref >60)
Glucose, Bld: 107 mg/dL — ABNORMAL HIGH (ref 70–99)
Potassium: 4.5 mmol/L (ref 3.5–5.1)
Sodium: 130 mmol/L — ABNORMAL LOW (ref 135–145)

## 2018-10-17 MED ORDER — TORSEMIDE 20 MG PO TABS: ORAL_TABLET | ORAL | 11 refills | Status: DC

## 2018-10-17 NOTE — Telephone Encounter (Signed)
Notes recorded by Harvie Junior, CMA on 10/17/2018 at 4:35 PM EST Spoke with pt he is aware and lab appt scheduled. He said he has an appt an echo on 12/19 he did not want to schedule an office visit for next week but agreed to lab work Friday. ------  Notes recorded by Larey Dresser, MD on 10/17/2018 at 4:09 PM EST Creatinine is higher but he is also significantly volume overloaded. Make sure he has stopped etodolac (NSAID). Would increase torsemide as planned but needs very close followup, have BMET done on Friday and have him return to NP/PA clinic next week. May need renal referral.

## 2018-10-17 NOTE — Progress Notes (Signed)
Rx for motorized wheelchair faxed to [facility].

## 2018-10-17 NOTE — Patient Instructions (Addendum)
INCREASE TORSEMIDE, take 64m (3 tabs) twice a day for 3 days. THEN Torsemide 640m(3 tabs) in the morning and 4034m2 tabs) in the evening.  DISCONTINUE Etodolac  Labs today We will only contact you if something comes back abnormal or we need to make some changes. Otherwise no news is good news!  Repeat Labs in 10 days.   Your physician recommends that you schedule a follow-up appointment in: 2 weeks with NP/PA  Your physician has requested that you have an echocardiogram. Echocardiography is a painless test that uses sound waves to create images of your heart. It provides your doctor with information about the size and shape of your heart and how well your heart's chambers and valves are working. This procedure takes approximately one hour. There are no restrictions for this procedure.

## 2018-10-17 NOTE — Telephone Encounter (Signed)
-----   Message from Larey Dresser, MD sent at 10/17/2018  4:09 PM EST ----- Creatinine is higher but he is also significantly volume overloaded.  Make sure he has stopped etodolac (NSAID).  Would increase torsemide as planned but needs very close followup, have BMET done on Friday and have him return to NP/PA clinic next week.  May need renal referral.

## 2018-10-18 NOTE — Progress Notes (Signed)
Advanced Heart Failure Clinic Note   PCP: Dr. Lynnda Child Cardiology: Dr. Martinique HF Cardiology: Dr. Lynelle Doctor is a 62 y.o. male with permanent atrial fibrillation, NASH with cirrhosis, suspected OHS/OSA, and chronic diastolic CHF/RV failure was referred by Dr. Martinique for evaluation of CHF.   Patient has had atrial fibrillation since at least 2015. He has had problems with peripheral edema x years. He has also had some degree of dyspnea for a long time.  However, towards the end of 2018, dyspnea became markedly worse.  At last appointment, I started him on home oxygen.   Echo was done in 11/18 showing EF 50-55%, septal flattening, severe RV dilation, PASP 77 mmHg. LHC/RHC in 1/19 showed no CAD, evidence for RV failure and severe pulmonary hypertension.  PFTs in 1/19 showed severe COPD.  V/Q scan in 1/19 showed no evidence for chronic PEs.  Sleep study showed severe OSA, using CPAP.    He presents for followup of diastolic CHF.  Weight is actually down about 4 lbs but he reports dyspnea with any activity.  Legs are giving out faster than in the past and he feels weaker.  Using walker most of the time.  He has chronic lower leg edema and wears wraps. He remains on 2L oxygen by nasal cannula and CPAP at night. He has been taking etodolac.  Creatinine rising, 1.8 most recently.  +Nausea.  Labs (11/19): K 5.1, creatinine 1.8, hgb 14.8  ECG (personally reviewed): atrial fibrillation, RBBB at 67  Review of systems complete and found to be negative unless listed in HPI.   PMH: 1. Non-hodgkins lymphoma: follicular small cell lymphoma.  S/p treatment, in clinical remission.  2. Cirrhosis: Suspect NASH.  - Abdominal US (10/18): Suggestive of cirrhosis.  3. Hyperlipidemia.  4. Obesity 5. Atrial fibrillation: Suspected permanent. He appears to have been in atrial fibrillation at least back to 2015.  6. Depression 7. Venous insufficiency 8. OHS/OSA: Sleep study with severe OSA.  9. H/o  vertebral osteomyelitis.  10. OA knees 11. Chronic diastolic CHF/RV failure: Echo (11/18) with EF 50-55%, septal flattening, RV severely dilated with normal systolic function, PASP 77 mmHg, moderate TR.  12. Pulmonary hypertension: Suspect due to OHS/OSA and COPD.  - RHC (1/19): mean RA 20, PA 87/37 mean 52, mean PCWP 16, CI 2.12, PVR 5.6 WU.  - V/Q scan 1/19 with no evidence for chronic PE.                                                                                                                  13. Coronary angiography (1/19): No significant CAD.  14. COPD: PFTs in 1/19 suggested severe COPD.   Social History   Socioeconomic History  . Marital status: Divorced    Spouse name: Not on file  . Number of children: 2  . Years of education: Not on file  . Highest education level: Not on file  Occupational History  . Occupation: disabled  Social Needs  . Emergency planning/management officer  strain: Not on file  . Food insecurity:    Worry: Not on file    Inability: Not on file  . Transportation needs:    Medical: Not on file    Non-medical: Not on file  Tobacco Use  . Smoking status: Former Smoker    Packs/day: 0.50    Years: 8.00    Pack years: 4.00    Types: Cigarettes    Start date: 09/26/1966    Last attempt to quit: 06/26/1989    Years since quitting: 29.3  . Smokeless tobacco: Never Used  . Tobacco comment: quit 38 years ago  Substance and Sexual Activity  . Alcohol use: No    Alcohol/week: 0.0 standard drinks  . Drug use: No  . Sexual activity: Not on file  Lifestyle  . Physical activity:    Days per week: Not on file    Minutes per session: Not on file  . Stress: Not on file  Relationships  . Social connections:    Talks on phone: Not on file    Gets together: Not on file    Attends religious service: Not on file    Active member of club or organization: Not on file    Attends meetings of clubs or organizations: Not on file    Relationship status: Not on file  . Intimate  partner violence:    Fear of current or ex partner: Not on file    Emotionally abused: Not on file    Physically abused: Not on file    Forced sexual activity: Not on file  Other Topics Concern  . Not on file  Social History Narrative  . Not on file   Family History  Problem Relation Age of Onset  . Cancer Father        pacemaker  . Bradycardia Father    Current Outpatient Medications  Medication Sig Dispense Refill  . apixaban (ELIQUIS) 5 MG TABS tablet Take 1 tablet (5 mg total) by mouth 2 (two) times daily. 60 tablet 3  . cycloSPORINE (RESTASIS) 0.05 % ophthalmic emulsion Place 1 drop into both eyes 2 (two) times daily as needed (for eye irritation/dryness).    Marland Kitchen diclofenac sodium (VOLTAREN) 1 % GEL Apply 1 g topically 4 (four) times daily as needed (for knee pain.).    Marland Kitchen fluticasone (FLONASE) 50 MCG/ACT nasal spray Place 2 sprays into both nostrils at bedtime.     Marland Kitchen lactulose (CHRONULAC) 10 GM/15ML solution Take 10 g by mouth daily.     Marland Kitchen levothyroxine (SYNTHROID, LEVOTHROID) 125 MCG tablet Take 125 mcg by mouth daily before breakfast.   0  . Misc. Devices (ROLLATOR) MISC Use daily with walking 1 each 0  . nadolol (CORGARD) 20 MG tablet Take 20 mg by mouth daily.     Marland Kitchen omega-3 acid ethyl esters (LOVAZA) 1 g capsule Take 2 g by mouth 2 (two) times daily.   0  . oxyCODONE (OXYCONTIN) 40 mg 12 hr tablet Take 1 tablet (40 mg total) by mouth every 12 (twelve) hours. 60 tablet 0  . Oxycodone HCl 20 MG TABS Take 1 tablet (20 mg total) by mouth every 8 (eight) hours as needed. 90 tablet 0  . spironolactone (ALDACTONE) 100 MG tablet Take 100 mg by mouth 2 (two) times daily.      Marland Kitchen torsemide (DEMADEX) 20 MG tablet Take 3 tablets (60 mg total) by mouth every morning AND 2 tablets (40 mg total) every evening. 150 tablet 11  .  NARCAN 4 MG/0.1ML LIQD nasal spray kit Place 1 spray into the nose as needed (For accidental opoid overdose).   0   No current facility-administered medications for  this encounter.    Vitals:   10/17/18 1403  BP: 110/74  Pulse: 70  SpO2: 95%  Weight: (!) 194.1 kg (428 lb)     Wt Readings from Last 3 Encounters:  10/17/18 (!) 194.1 kg (428 lb)  07/27/18 (!) 199.6 kg (440 lb)  07/11/18 (!) 196.3 kg (432 lb 12.8 oz)   Physical Exam General: NAD, super morbid obesity, in wheelchair.  Neck: Thick, JVP 14+ cm, no thyromegaly or thyroid nodule.  Lungs: Clear to auscultation bilaterally with normal respiratory effort. CV: Nondisplaced PMI.  Heart irregular S1/S2, no S3/S4, no murmur.  Chronic 2+ LE edema with wraps. Unable to palpate pedal pulses due to edema.  Abdomen: Soft, nontender, no hepatosplenomegaly, no distention.  Skin: Intact without lesions or rashes.  Neurologic: Alert and oriented x 3.  Psych: Normal affect. Extremities: No clubbing or cyanosis.  HEENT: Normal.   1. Chronic diastolic CHF with prominent RV failure: Echo most remarkable for pulmonary hypertension and RV failure (11/18). Suspect RV failure is triggered by pulmonary hypertension.  Underlying OHS/OSA and COPD have likely caused pulmonary hypertension/RV failure. NYHA IIIb-IV. Severely limited by body habitus.  He is volume overloaded on exam and more symptomatic despite recent increase in creatinine to 1.8.  - Increase torsemide to 60 mg bid x 3 days then 60 qam/40 qpm.  BMET today and again in 10 days.  - Continue spiro 100 mg BID - Repeat echo.  - Continue UNNA boots. Wounds have healed, so no longer following with wound clinic 2. Pulmonary hypertension: Severe by echo and RHC (pulmonary arterial hypertension).  Suspect group 3 disease due to COPD (severe obstruction on PFTs) and OSA/OHS.  V/Q scan not suggestive of chronic PEs.  He is on home oxygen and CPAP.  - Most likely would not benefit significantly from pulmonary vasodilators.  3. OHS/OSA: Continue chronic O2 and nightly CPAP.   4. Cirrhosis: Suspected NASH versus congestive hepatopathy from RV failure.  Denies  ETOH. Cannot totally rule out component of portopulmonary hypertension. He is on spironolactone at high dose.  No change.  5. Atrial fibrillation: Chronic, suspect permanent (present since at least 2015). Rate controlled on nadolol.  - Continue Eliquis 5 mg BID.   6. Super Morbid Obesity: Body mass index is 58.05 kg/m.  7. CKD: Stage 3, creatinine most recently 1.8.  May be cardiorenal in setting of RV failure.  - Needs to stop etodolac (NSAID).  We discussed this today.  - BMET today and again in 10 days.  Increased diuresis with fall in renal venous pressure may help renal function.   He will need close followup.  See NP/PA in 2 wks.   [name], MD  10/18/2018

## 2018-10-20 ENCOUNTER — Ambulatory Visit (HOSPITAL_COMMUNITY)
Admission: RE | Admit: 2018-10-20 | Discharge: 2018-10-20 | Disposition: A | Discharge status: Home / Self Care | Payer: Medicare Other | Source: Ambulatory Visit | Attending: Cardiology | Admitting: Cardiology

## 2018-10-20 ENCOUNTER — Telehealth (HOSPITAL_COMMUNITY): Payer: Self-pay

## 2018-10-20 DIAGNOSIS — I5022 Chronic systolic (congestive) heart failure: Secondary | ICD-10-CM | POA: Insufficient documentation

## 2018-10-20 LAB — BASIC METABOLIC PANEL
Anion gap: 14 (ref 5–15)
BUN: 47 mg/dL — AB (ref 8–23)
CHLORIDE: 95 mmol/L — AB (ref 98–111)
CO2: 25 mmol/L (ref 22–32)
CREATININE: 2.33 mg/dL — AB (ref 0.61–1.24)
Calcium: 9.5 mg/dL (ref 8.9–10.3)
GFR calc Af Amer: 33 mL/min — ABNORMAL LOW (ref >60)
GFR, EST NON AFRICAN AMERICAN: 28 mL/min — AB (ref >60)
GLUCOSE: 130 mg/dL — AB (ref 70–99)
Potassium: 4.3 mmol/L (ref 3.5–5.1)
SODIUM: 134 mmol/L — AB (ref 135–145)

## 2018-10-20 NOTE — Telephone Encounter (Signed)
Pt called with lab results Creatinine not back to baseline but it is lower.

## 2018-10-30 DIAGNOSIS — I5032 Chronic diastolic (congestive) heart failure: Secondary | ICD-10-CM | POA: Diagnosis not present

## 2018-10-30 DIAGNOSIS — I272 Pulmonary hypertension, unspecified: Secondary | ICD-10-CM | POA: Diagnosis not present

## 2018-10-30 DIAGNOSIS — R0902 Hypoxemia: Secondary | ICD-10-CM | POA: Diagnosis not present

## 2018-10-30 DIAGNOSIS — M2550 Pain in unspecified joint: Secondary | ICD-10-CM | POA: Diagnosis not present

## 2018-11-02 ENCOUNTER — Ambulatory Visit (HOSPITAL_COMMUNITY)
Admission: RE | Admit: 2018-11-02 | Discharge: 2018-11-02 | Disposition: A | Discharge status: Home / Self Care | Payer: Medicare Other | Source: Ambulatory Visit | Attending: Family Medicine | Admitting: Family Medicine

## 2018-11-02 ENCOUNTER — Ambulatory Visit (HOSPITAL_BASED_OUTPATIENT_CLINIC_OR_DEPARTMENT_OTHER)
Admission: RE | Admit: 2018-11-02 | Discharge: 2018-11-02 | Disposition: A | Discharge status: Home / Self Care | Payer: Medicare Other | Source: Ambulatory Visit | Attending: Internal Medicine | Admitting: Internal Medicine

## 2018-11-02 ENCOUNTER — Encounter (HOSPITAL_COMMUNITY): Payer: Self-pay

## 2018-11-02 VITALS — BP 124/68 | HR 77 | Wt >= 6400 oz

## 2018-11-02 DIAGNOSIS — I4821 Permanent atrial fibrillation: Secondary | ICD-10-CM | POA: Diagnosis not present

## 2018-11-02 DIAGNOSIS — Z87891 Personal history of nicotine dependence: Secondary | ICD-10-CM | POA: Insufficient documentation

## 2018-11-02 DIAGNOSIS — J449 Chronic obstructive pulmonary disease, unspecified: Secondary | ICD-10-CM | POA: Diagnosis not present

## 2018-11-02 DIAGNOSIS — Z9981 Dependence on supplemental oxygen: Secondary | ICD-10-CM | POA: Insufficient documentation

## 2018-11-02 DIAGNOSIS — Z8572 Personal history of non-Hodgkin lymphomas: Secondary | ICD-10-CM | POA: Insufficient documentation

## 2018-11-02 DIAGNOSIS — I5032 Chronic diastolic (congestive) heart failure: Secondary | ICD-10-CM

## 2018-11-02 DIAGNOSIS — G4733 Obstructive sleep apnea (adult) (pediatric): Secondary | ICD-10-CM | POA: Diagnosis not present

## 2018-11-02 DIAGNOSIS — Z79899 Other long term (current) drug therapy: Secondary | ICD-10-CM | POA: Insufficient documentation

## 2018-11-02 DIAGNOSIS — Z79890 Hormone replacement therapy (postmenopausal): Secondary | ICD-10-CM | POA: Insufficient documentation

## 2018-11-02 DIAGNOSIS — Z9989 Dependence on other enabling machines and devices: Secondary | ICD-10-CM | POA: Insufficient documentation

## 2018-11-02 DIAGNOSIS — Z7901 Long term (current) use of anticoagulants: Secondary | ICD-10-CM | POA: Insufficient documentation

## 2018-11-02 DIAGNOSIS — I272 Pulmonary hypertension, unspecified: Secondary | ICD-10-CM | POA: Diagnosis not present

## 2018-11-02 DIAGNOSIS — Z6841 Body Mass Index (BMI) 40.0 and over, adult: Secondary | ICD-10-CM | POA: Insufficient documentation

## 2018-11-02 DIAGNOSIS — I872 Venous insufficiency (chronic) (peripheral): Secondary | ICD-10-CM | POA: Diagnosis not present

## 2018-11-02 DIAGNOSIS — I4891 Unspecified atrial fibrillation: Secondary | ICD-10-CM | POA: Insufficient documentation

## 2018-11-02 DIAGNOSIS — N183 Chronic kidney disease, stage 3 unspecified: Secondary | ICD-10-CM

## 2018-11-02 DIAGNOSIS — K7460 Unspecified cirrhosis of liver: Secondary | ICD-10-CM | POA: Insufficient documentation

## 2018-11-02 DIAGNOSIS — I071 Rheumatic tricuspid insufficiency: Secondary | ICD-10-CM | POA: Diagnosis not present

## 2018-11-02 DIAGNOSIS — I4819 Other persistent atrial fibrillation: Secondary | ICD-10-CM

## 2018-11-02 DIAGNOSIS — R21 Rash and other nonspecific skin eruption: Secondary | ICD-10-CM | POA: Diagnosis not present

## 2018-11-02 DIAGNOSIS — Z79891 Long term (current) use of opiate analgesic: Secondary | ICD-10-CM | POA: Insufficient documentation

## 2018-11-02 DIAGNOSIS — I50810 Right heart failure, unspecified: Secondary | ICD-10-CM

## 2018-11-02 LAB — BASIC METABOLIC PANEL
ANION GAP: 11 (ref 5–15)
BUN: 33 mg/dL — AB (ref 8–23)
CO2: 29 mmol/L (ref 22–32)
Calcium: 9.2 mg/dL (ref 8.9–10.3)
Chloride: 91 mmol/L — ABNORMAL LOW (ref 98–111)
Creatinine, Ser: 1.88 mg/dL — ABNORMAL HIGH (ref 0.61–1.24)
GFR calc Af Amer: 43 mL/min — ABNORMAL LOW (ref >60)
GFR calc non Af Amer: 37 mL/min — ABNORMAL LOW (ref >60)
GLUCOSE: 108 mg/dL — AB (ref 70–99)
POTASSIUM: 4.7 mmol/L (ref 3.5–5.1)
Sodium: 131 mmol/L — ABNORMAL LOW (ref 135–145)

## 2018-11-02 LAB — MAGNESIUM: MAGNESIUM: 2.5 mg/dL — AB (ref 1.7–2.4)

## 2018-11-02 NOTE — Progress Notes (Signed)
  Echocardiogram 2D Echocardiogram has been performed.  Jadore Veals G Shep Porter 11/02/2018, 2:03 PM

## 2018-11-02 NOTE — Patient Instructions (Signed)
Labs done today  Follow up with the Advanced Practice Providers in 3 months  Happy Holidays!!!

## 2018-11-02 NOTE — Progress Notes (Addendum)
Advanced Heart Failure Clinic Note   PCP: Dr. Lynnda Child Cardiology: Dr. Martinique HF Cardiology: Dr. Lynelle Doctor is a 62 y.o. male with permanent atrial fibrillation, NASH with cirrhosis, suspected OHS/OSA, and chronic diastolic CHF/RV failure was referred by Dr. Martinique for evaluation of CHF.   Patient has had atrial fibrillation since at least 2015. He has had problems with peripheral edema x years. He has also had some degree of dyspnea for a long time.  However, towards the end of 2018, dyspnea became markedly worse.  At last appointment, I started him on home oxygen.   Echo was done in 11/18 showing EF 50-55%, septal flattening, severe RV dilation, PASP 77 mmHg. LHC/RHC in 1/19 showed no CAD, evidence for RV failure and severe pulmonary hypertension.  PFTs in 1/19 showed severe COPD.  V/Q scan in 1/19 showed no evidence for chronic PEs.  Sleep study showed severe OSA, using CPAP.    He returns today for 2 week follow up of diastolic HF. Last visit lasix was increased. Creatinine was up to 2.5 (baseline ~1.5), but came down to 2.3 with diuresis and stopping etodolac. Overall doing better. He is able to walk in the house with walker and does okay if he moves slowly. He has some SOB with walking, but has improved. He has chronic BLE edema and wears wraps. Sleeps with HOB elevated at baseline. Denies PND. Wears CPAP qHS and 2 L O2 at all times. He gets dizzy after exerting himself. He has a an itchy rash on abdomen and chest area that started around Thanksgiving. No new detergents or medications. He is using a different blanket that he thinks may be causing symptoms. He is eating [employer] most nights. Drinks 1-2 L of fluid daily. Weights ~420 lbs at home. O2 sats 90-94% on home checks. Taking all medications.   Echo today: EF 60-65%, RV severly dilated and severly reduced function  Labs (11/19): K 5.1, creatinine 1.8, hgb 14.8  ECG (personally reviewed): atrial fibrillation, RBBB at  67  Review of systems complete and found to be negative unless listed in HPI.   PMH: 1. Non-hodgkins lymphoma: follicular small cell lymphoma.  S/p treatment, in clinical remission.  2. Cirrhosis: Suspect NASH.  - Abdominal US (10/18): Suggestive of cirrhosis.  3. Hyperlipidemia.  4. Obesity 5. Atrial fibrillation: Suspected permanent. He appears to have been in atrial fibrillation at least back to 2015.  6. Depression 7. Venous insufficiency 8. OHS/OSA: Sleep study with severe OSA.  9. H/o vertebral osteomyelitis.  10. OA knees 11. Chronic diastolic CHF/RV failure: Echo (11/18) with EF 50-55%, septal flattening, RV severely dilated with normal systolic function, PASP 77 mmHg, moderate TR.  Echo 11/02/18: EF 60-65%, RV severly dilated and severly reduced function 12. Pulmonary hypertension: Suspect due to OHS/OSA and COPD.  - RHC (1/19): mean RA 20, PA 87/37 mean 52, mean PCWP 16, CI 2.12, PVR 5.6 WU.  - V/Q scan 1/19 with no evidence for chronic PE.  13. Coronary angiography (1/19): No significant CAD.  14. COPD: PFTs in 1/19 suggested severe COPD.   Social History   Socioeconomic History  . Marital status: Divorced    Spouse name: Not on file  . Number of children: 2  . Years of education: Not on file  . Highest education level: Not on file  Occupational History  . Occupation: disabled  Social Needs  . Financial resource strain: Not on file  . Food insecurity:    Worry: Not on file    Inability: Not on file  . Transportation needs:    Medical: Not on file    Non-medical: Not on file  Tobacco Use  . Smoking status: Former Smoker    Packs/day: 0.50    Years: 8.00    Pack years: 4.00    Types: Cigarettes    Start date: 09/26/1966    Last attempt to quit: 06/26/1989    Years since quitting: 29.3  . Smokeless tobacco: Never Used  . Tobacco comment: quit 38  years ago  Substance and Sexual Activity  . Alcohol use: No    Alcohol/week: 0.0 standard drinks  . Drug use: No  . Sexual activity: Not on file  Lifestyle  . Physical activity:    Days per week: Not on file    Minutes per session: Not on file  . Stress: Not on file  Relationships  . Social connections:    Talks on phone: Not on file    Gets together: Not on file    Attends religious service: Not on file    Active member of club or organization: Not on file    Attends meetings of clubs or organizations: Not on file    Relationship status: Not on file  . Intimate partner violence:    Fear of current or ex partner: Not on file    Emotionally abused: Not on file    Physically abused: Not on file    Forced sexual activity: Not on file  Other Topics Concern  . Not on file  Social History Narrative  . Not on file   Family History  Problem Relation Age of Onset  . Cancer Father        pacemaker  . Bradycardia Father    Current Outpatient Medications  Medication Sig Dispense Refill  . apixaban (ELIQUIS) 5 MG TABS tablet Take 1 tablet (5 mg total) by mouth 2 (two) times daily. 60 tablet 3  . cycloSPORINE (RESTASIS) 0.05 % ophthalmic emulsion Place 1 drop into both eyes 2 (two) times daily as needed (for eye irritation/dryness).    Marland Kitchen diclofenac sodium (VOLTAREN) 1 % GEL Apply 1 g topically 4 (four) times daily as needed (for knee pain.).    Marland Kitchen fluticasone (FLONASE) 50 MCG/ACT nasal spray Place 2 sprays into both nostrils at bedtime.     Marland Kitchen lactulose (CHRONULAC) 10 GM/15ML solution Take 10 g by mouth daily.     Marland Kitchen levothyroxine (SYNTHROID, LEVOTHROID) 125 MCG tablet Take 125 mcg by mouth daily before breakfast.   0  . Misc. Devices (ROLLATOR) MISC Use daily with walking 1 each 0  . nadolol (CORGARD) 20 MG tablet Take 20 mg by mouth daily.     Marland Kitchen NARCAN 4 MG/0.1ML LIQD nasal spray kit Place 1 spray into the nose as needed (For accidental opoid overdose).   0  . omega-3 acid ethyl esters  (LOVAZA) 1 g capsule Take 2 g by mouth 2 (two) times daily.   0  .  oxyCODONE (OXYCONTIN) 40 mg 12 hr tablet Take 1 tablet (40 mg total) by mouth every 12 (twelve) hours. 60 tablet 0  . Oxycodone HCl 20 MG TABS Take 1 tablet (20 mg total) by mouth every 8 (eight) hours as needed. 90 tablet 0  . spironolactone (ALDACTONE) 100 MG tablet Take 100 mg by mouth 2 (two) times daily.      Marland Kitchen torsemide (DEMADEX) 20 MG tablet Take 3 tablets (60 mg total) by mouth every morning AND 2 tablets (40 mg total) every evening. 150 tablet 11   No current facility-administered medications for this encounter.    Vitals:   11/02/18 1435  BP: 124/68  Pulse: 77  SpO2: 91%  Weight: (!) 190.4 kg (419 lb 12.8 oz)     Wt Readings from Last 3 Encounters:  11/02/18 (!) 190.4 kg (419 lb 12.8 oz)  10/17/18 (!) 194.1 kg (428 lb)  07/27/18 (!) 199.6 kg (440 lb)   Physical Exam General: Morbid obesity. In wheelchair. No resp difficulty. HEENT: Normal Neck: Supple. JVP 8-9, thick. Carotids 2+ bilat; no bruits. No thyromegaly or nodule noted. Cor: PMI nondisplaced. IRR, No M/G/R noted Lungs: CTAB, normal effort. On 2 L O2 Abdomen: obese, soft, non-tender, +distended, no HSM. No bruits or masses. +BS. Erythematous rash with small red scabs over abdomen and chest area Extremities: No cyanosis, clubbing, or rash. R and LLE 2+ edema. BLE unna boots Neuro: Alert & orientedx3, cranial nerves grossly intact. moves all 4 extremities w/o difficulty. Affect pleasant   1. Chronic diastolic CHF with prominent RV failure: Echo most remarkable for pulmonary hypertension and RV failure (11/18). Suspect RV failure is triggered by pulmonary hypertension.  Underlying OHS/OSA and COPD have likely caused pulmonary hypertension/RV failure. NYHA IIIb. Severely limited by body habitus.  Volume improved. Down 10 lbs in 1 month - Continue torsemide 60 mg am, 40 mg pm. BMET today - Continue spiro 100 mg BID - Echo 11/02/18: EF 60-65%, RV  severely dilated and severley reduced function, RA severely dilated, severe TR, PA peak pressure 89 mm Hg - Continue UNNA boots. Wounds have healed, so no longer following with wound clinic - Discussed limiting fluid and salt intake and avoiding [employer]. 2. Pulmonary hypertension: Severe by echo and RHC (pulmonary arterial hypertension).  Suspect group 3 disease due to COPD (severe obstruction on PFTs) and OSA/OHS.  V/Q scan not suggestive of chronic PEs.  He is on home oxygen and CPAP.  - Most likely would not benefit significantly from pulmonary vasodilators. No change. 3. OHS/OSA: Continue chronic O2 and nightly CPAP.  No change.  4. Cirrhosis: Suspected NASH versus congestive hepatopathy from RV failure.  Denies ETOH. Cannot totally rule out component of portopulmonary hypertension. He is on spironolactone at high dose.  No change.  5. Atrial fibrillation: Chronic, suspect permanent (present since at least 2015). Rate controlled on nadolol.  - Continue Eliquis 5 mg BID.  Denies bleeding.  6. Super Morbid Obesity: Body mass index is 56.94 kg/m.  7. CKD: Stage 3, Creatinine up to 2.5 at last visit, improved to 2.3 with diuresis.  Baseline seems to be around ~1.5. May be cardiorenal in setting of RV failure.  - Off etodolac (NSAID). BMET today.  8. Rash - He has erythematous rash with small red scabs over abdomen and chest area that started around Thanksgiving. It is very itchy. No new medications. Discussed with PharmD and with Darrick Grinder, NP. Does not look like a drug rash. He has been using a  different blanket that is falling apart. Advised that he change blankets and see a dermatologist.    BMET Follow up in 3 months with Dr Matilde Bash, NP  11/02/2018   Greater than 50% of the 25 minute visit was spent in counseling/coordination of care regarding disease state education, salt/fluid restriction, sliding scale diuretics, and medication compliance.

## 2018-11-06 ENCOUNTER — Telehealth (HOSPITAL_COMMUNITY): Payer: Self-pay

## 2018-11-06 ENCOUNTER — Other Ambulatory Visit: Payer: Self-pay | Admitting: *Deleted

## 2018-11-06 DIAGNOSIS — G8929 Other chronic pain: Secondary | ICD-10-CM

## 2018-11-06 DIAGNOSIS — M549 Dorsalgia, unspecified: Secondary | ICD-10-CM

## 2018-11-06 DIAGNOSIS — Z8572 Personal history of non-Hodgkin lymphomas: Secondary | ICD-10-CM

## 2018-11-06 NOTE — Telephone Encounter (Signed)
Pt called with results Normal LV EF but severe RV dysfunction and severe pulmonary hypertension

## 2018-11-07 MED ORDER — OXYCODONE HCL ER 40 MG PO T12A: 40.0000 mg | EXTENDED_RELEASE_TABLET | Freq: Two times a day (BID) | ORAL | 0 refills | Status: DC

## 2018-11-07 MED ORDER — OXYCODONE HCL 20 MG PO TABS: 20.0000 mg | ORAL_TABLET | Freq: Three times a day (TID) | ORAL | 0 refills | Status: DC | PRN

## 2018-11-30 DIAGNOSIS — I272 Pulmonary hypertension, unspecified: Secondary | ICD-10-CM | POA: Diagnosis not present

## 2018-11-30 DIAGNOSIS — R0902 Hypoxemia: Secondary | ICD-10-CM | POA: Diagnosis not present

## 2018-11-30 DIAGNOSIS — I5032 Chronic diastolic (congestive) heart failure: Secondary | ICD-10-CM | POA: Diagnosis not present

## 2018-11-30 DIAGNOSIS — M2550 Pain in unspecified joint: Secondary | ICD-10-CM | POA: Diagnosis not present

## 2018-12-05 ENCOUNTER — Other Ambulatory Visit: Payer: Self-pay | Admitting: *Deleted

## 2018-12-05 DIAGNOSIS — M549 Dorsalgia, unspecified: Secondary | ICD-10-CM

## 2018-12-05 DIAGNOSIS — Z8572 Personal history of non-Hodgkin lymphomas: Secondary | ICD-10-CM

## 2018-12-05 DIAGNOSIS — G8929 Other chronic pain: Secondary | ICD-10-CM

## 2018-12-06 MED ORDER — OXYCODONE HCL ER 40 MG PO T12A: 40.0000 mg | EXTENDED_RELEASE_TABLET | Freq: Two times a day (BID) | ORAL | 0 refills | Status: DC

## 2018-12-06 MED ORDER — OXYCODONE HCL 20 MG PO TABS: 20.0000 mg | ORAL_TABLET | Freq: Three times a day (TID) | ORAL | 0 refills | Status: DC | PRN

## 2018-12-12 ENCOUNTER — Encounter: Payer: Self-pay | Admitting: Podiatry

## 2018-12-12 ENCOUNTER — Ambulatory Visit (INDEPENDENT_AMBULATORY_CARE_PROVIDER_SITE_OTHER): Payer: Medicare Other | Admitting: Podiatry

## 2018-12-12 DIAGNOSIS — M79676 Pain in unspecified toe(s): Secondary | ICD-10-CM | POA: Diagnosis not present

## 2018-12-12 DIAGNOSIS — B351 Tinea unguium: Secondary | ICD-10-CM | POA: Diagnosis not present

## 2018-12-12 DIAGNOSIS — D689 Coagulation defect, unspecified: Secondary | ICD-10-CM | POA: Diagnosis not present

## 2018-12-12 NOTE — Progress Notes (Signed)
Patient ID: Albert James, male   DOB: April 18, 1956, 63 y.o.   MRN: [id number] Complaint:  Visit Type: Patient returns to my office for continued preventative foot care services. Complaint: Patient states" my nails have grown long and thick and become painful to walk and wear shoes" Patient is obese and unable to get to his feet. The patient presents for preventative foot care services. No changes to ROS.  Patient is on eliquiss.  Podiatric Exam: Vascular: dorsalis pedis and posterior tibial pulses are not  palpable bilateral. Capillary return is immediate. Temperature gradient is WNL. Skin turgor WNL  Sensorium: Normal Semmes Weinstein monofilament test. Normal tactile sensation bilaterally. Nail Exam: Pt has thick disfigured discolored nails with subungual debris noted bilateral entire nail hallux through fifth toenails.  Hematoma noted on lateral aspect fourth toe right foot.  No infection Ulcer Exam: There is no evidence of ulcer or pre-ulcerative changes or infection. Orthopedic Exam: Muscle tone and strength are WNL. No limitations in general ROM. No crepitus or effusions noted. Foot type and digits show no abnormalities. Bony prominences are unremarkable. Severe swelling in legs and feet. Skin: No Porokeratosis. No infection or ulcers.  Thick crusty skin dorsum feet  B/L.  Diagnosis:  Onychomycosis, , Pain in right toe, pain in left toes  Treatment & Plan Procedures and Treatment: Consent by patient was obtained for treatment procedures. The patient understood the discussion of treatment and procedures well. All questions were answered thoroughly reviewed. Debridement of mycotic and hypertrophic toenails, 1 through 5 bilateral and clearing of subungual debris. No ulceration, no infection noted. Discussed parafin baths with patient for skin. Return Visit-Office Procedure: Patient instructed to return to the office for a follow up visit 10 weeks  for continued evaluation and  treatment.    Gardiner Barefoot DPM

## 2018-12-14 ENCOUNTER — Telehealth (HOSPITAL_COMMUNITY): Payer: Self-pay | Admitting: *Deleted

## 2018-12-14 NOTE — Telephone Encounter (Signed)
Entered in error

## 2018-12-15 ENCOUNTER — Other Ambulatory Visit (HOSPITAL_COMMUNITY): Payer: Self-pay | Admitting: Cardiology

## 2018-12-18 ENCOUNTER — Telehealth (HOSPITAL_COMMUNITY): Payer: Self-pay

## 2018-12-18 NOTE — Telephone Encounter (Signed)
Pt  Notified. Verbalizes understanding. Lab appt made.

## 2018-12-18 NOTE — Telephone Encounter (Signed)
Pt called to report wt gain of 10-12 lbs in the past 2 wks. Pt states he has edema in legs, SOB, and his urine is darker than usual. Pt is currently taking torsemide 20 mg (Take 3 tablets (60 mg total) by mouth every morning AND 2 tablets (40 mg total) every evening). Please advise.

## 2018-12-18 NOTE — Telephone Encounter (Signed)
   Please call back.   Increase torsemide to 60 mg twice a day. He will need BMET in 7 days.   Amy Clegg NP-C 1:00 PM

## 2018-12-22 ENCOUNTER — Inpatient Hospital Stay (HOSPITAL_COMMUNITY)
Admission: EM | Admit: 2018-12-22 | Discharge: 2018-12-30 | DRG: 292 | Disposition: A | Discharge status: Skilled Nursing Facility | Payer: Medicare Other | Attending: Internal Medicine | Admitting: Internal Medicine

## 2018-12-22 ENCOUNTER — Other Ambulatory Visit: Payer: Self-pay

## 2018-12-22 ENCOUNTER — Encounter (HOSPITAL_COMMUNITY): Payer: Self-pay

## 2018-12-22 DIAGNOSIS — K7581 Nonalcoholic steatohepatitis (NASH): Secondary | ICD-10-CM | POA: Diagnosis not present

## 2018-12-22 DIAGNOSIS — I50813 Acute on chronic right heart failure: Principal | ICD-10-CM | POA: Diagnosis present

## 2018-12-22 DIAGNOSIS — N289 Disorder of kidney and ureter, unspecified: Secondary | ICD-10-CM | POA: Diagnosis not present

## 2018-12-22 DIAGNOSIS — R234 Changes in skin texture: Secondary | ICD-10-CM | POA: Diagnosis not present

## 2018-12-22 DIAGNOSIS — I2729 Other secondary pulmonary hypertension: Secondary | ICD-10-CM | POA: Diagnosis not present

## 2018-12-22 DIAGNOSIS — I872 Venous insufficiency (chronic) (peripheral): Secondary | ICD-10-CM | POA: Diagnosis not present

## 2018-12-22 DIAGNOSIS — N183 Chronic kidney disease, stage 3 (moderate): Secondary | ICD-10-CM | POA: Diagnosis not present

## 2018-12-22 DIAGNOSIS — R0902 Hypoxemia: Secondary | ICD-10-CM | POA: Diagnosis not present

## 2018-12-22 DIAGNOSIS — M549 Dorsalgia, unspecified: Secondary | ICD-10-CM | POA: Diagnosis present

## 2018-12-22 DIAGNOSIS — I071 Rheumatic tricuspid insufficiency: Secondary | ICD-10-CM | POA: Diagnosis present

## 2018-12-22 DIAGNOSIS — R52 Pain, unspecified: Secondary | ICD-10-CM | POA: Diagnosis not present

## 2018-12-22 DIAGNOSIS — K7469 Other cirrhosis of liver: Secondary | ICD-10-CM | POA: Diagnosis not present

## 2018-12-22 DIAGNOSIS — I2609 Other pulmonary embolism with acute cor pulmonale: Secondary | ICD-10-CM

## 2018-12-22 DIAGNOSIS — Z7901 Long term (current) use of anticoagulants: Secondary | ICD-10-CM

## 2018-12-22 DIAGNOSIS — E039 Hypothyroidism, unspecified: Secondary | ICD-10-CM | POA: Diagnosis present

## 2018-12-22 DIAGNOSIS — I272 Pulmonary hypertension, unspecified: Secondary | ICD-10-CM | POA: Diagnosis not present

## 2018-12-22 DIAGNOSIS — Z79890 Hormone replacement therapy (postmenopausal): Secondary | ICD-10-CM

## 2018-12-22 DIAGNOSIS — I4821 Permanent atrial fibrillation: Secondary | ICD-10-CM | POA: Diagnosis not present

## 2018-12-22 DIAGNOSIS — R296 Repeated falls: Secondary | ICD-10-CM | POA: Diagnosis present

## 2018-12-22 DIAGNOSIS — I509 Heart failure, unspecified: Secondary | ICD-10-CM

## 2018-12-22 DIAGNOSIS — I959 Hypotension, unspecified: Secondary | ICD-10-CM | POA: Diagnosis not present

## 2018-12-22 DIAGNOSIS — D696 Thrombocytopenia, unspecified: Secondary | ICD-10-CM

## 2018-12-22 DIAGNOSIS — E871 Hypo-osmolality and hyponatremia: Secondary | ICD-10-CM | POA: Diagnosis not present

## 2018-12-22 DIAGNOSIS — G8929 Other chronic pain: Secondary | ICD-10-CM | POA: Diagnosis not present

## 2018-12-22 DIAGNOSIS — I739 Peripheral vascular disease, unspecified: Secondary | ICD-10-CM | POA: Diagnosis present

## 2018-12-22 DIAGNOSIS — M25562 Pain in left knee: Secondary | ICD-10-CM | POA: Diagnosis present

## 2018-12-22 DIAGNOSIS — Z515 Encounter for palliative care: Secondary | ICD-10-CM | POA: Diagnosis not present

## 2018-12-22 DIAGNOSIS — M2550 Pain in unspecified joint: Secondary | ICD-10-CM | POA: Diagnosis not present

## 2018-12-22 DIAGNOSIS — J449 Chronic obstructive pulmonary disease, unspecified: Secondary | ICD-10-CM | POA: Diagnosis present

## 2018-12-22 DIAGNOSIS — J9611 Chronic respiratory failure with hypoxia: Secondary | ICD-10-CM | POA: Diagnosis present

## 2018-12-22 DIAGNOSIS — R609 Edema, unspecified: Secondary | ICD-10-CM | POA: Diagnosis not present

## 2018-12-22 DIAGNOSIS — Z8572 Personal history of non-Hodgkin lymphomas: Secondary | ICD-10-CM

## 2018-12-22 DIAGNOSIS — E876 Hypokalemia: Secondary | ICD-10-CM | POA: Diagnosis not present

## 2018-12-22 DIAGNOSIS — I890 Lymphedema, not elsewhere classified: Secondary | ICD-10-CM | POA: Diagnosis not present

## 2018-12-22 DIAGNOSIS — I482 Chronic atrial fibrillation, unspecified: Secondary | ICD-10-CM

## 2018-12-22 DIAGNOSIS — I5033 Acute on chronic diastolic (congestive) heart failure: Secondary | ICD-10-CM

## 2018-12-22 DIAGNOSIS — M47816 Spondylosis without myelopathy or radiculopathy, lumbar region: Secondary | ICD-10-CM | POA: Diagnosis present

## 2018-12-22 DIAGNOSIS — Z9981 Dependence on supplemental oxygen: Secondary | ICD-10-CM

## 2018-12-22 DIAGNOSIS — E662 Morbid (severe) obesity with alveolar hypoventilation: Secondary | ICD-10-CM | POA: Diagnosis present

## 2018-12-22 DIAGNOSIS — Z87891 Personal history of nicotine dependence: Secondary | ICD-10-CM

## 2018-12-22 DIAGNOSIS — Z7951 Long term (current) use of inhaled steroids: Secondary | ICD-10-CM

## 2018-12-22 DIAGNOSIS — Z79899 Other long term (current) drug therapy: Secondary | ICD-10-CM

## 2018-12-22 DIAGNOSIS — K469 Unspecified abdominal hernia without obstruction or gangrene: Secondary | ICD-10-CM | POA: Diagnosis present

## 2018-12-22 DIAGNOSIS — M545 Low back pain: Secondary | ICD-10-CM | POA: Diagnosis not present

## 2018-12-22 DIAGNOSIS — I2781 Cor pulmonale (chronic): Secondary | ICD-10-CM | POA: Diagnosis present

## 2018-12-22 DIAGNOSIS — R0689 Other abnormalities of breathing: Secondary | ICD-10-CM | POA: Diagnosis not present

## 2018-12-22 DIAGNOSIS — M6281 Muscle weakness (generalized): Secondary | ICD-10-CM | POA: Diagnosis not present

## 2018-12-22 DIAGNOSIS — Z881 Allergy status to other antibiotic agents status: Secondary | ICD-10-CM

## 2018-12-22 DIAGNOSIS — Z6841 Body Mass Index (BMI) 40.0 and over, adult: Secondary | ICD-10-CM

## 2018-12-22 DIAGNOSIS — F419 Anxiety disorder, unspecified: Secondary | ICD-10-CM | POA: Diagnosis present

## 2018-12-22 DIAGNOSIS — M25561 Pain in right knee: Secondary | ICD-10-CM | POA: Diagnosis present

## 2018-12-22 DIAGNOSIS — Z7401 Bed confinement status: Secondary | ICD-10-CM | POA: Diagnosis not present

## 2018-12-22 DIAGNOSIS — I4891 Unspecified atrial fibrillation: Secondary | ICD-10-CM | POA: Diagnosis not present

## 2018-12-22 DIAGNOSIS — L8990 Pressure ulcer of unspecified site, unspecified stage: Secondary | ICD-10-CM

## 2018-12-22 DIAGNOSIS — J811 Chronic pulmonary edema: Secondary | ICD-10-CM | POA: Diagnosis not present

## 2018-12-22 DIAGNOSIS — I5032 Chronic diastolic (congestive) heart failure: Secondary | ICD-10-CM | POA: Diagnosis not present

## 2018-12-22 DIAGNOSIS — C8590 Non-Hodgkin lymphoma, unspecified, unspecified site: Secondary | ICD-10-CM | POA: Diagnosis present

## 2018-12-22 DIAGNOSIS — K7460 Unspecified cirrhosis of liver: Secondary | ICD-10-CM | POA: Diagnosis present

## 2018-12-22 DIAGNOSIS — I5040 Unspecified combined systolic (congestive) and diastolic (congestive) heart failure: Secondary | ICD-10-CM | POA: Diagnosis not present

## 2018-12-22 HISTORY — DX: Localized edema: R60.0

## 2018-12-22 HISTORY — DX: Heart failure, unspecified: I50.9

## 2018-12-22 HISTORY — DX: Other pulmonary embolism with acute cor pulmonale: I26.09

## 2018-12-22 NOTE — ED Provider Notes (Signed)
[facility] EMERGENCY DEPARTMENT Provider Note   CSN: [id number] Arrival date & time: 12/22/18  2235     History   Chief Complaint Chief Complaint  Patient presents with  . Weakness    HPI Albert James is a 63 y.o. male.  The history is provided by the patient.  Weakness  He has history of chronic diastolic heart failure, atrial fibrillation, non-Hodgkin's lymphoma in remission, cirrhosis, peripheral vascular disease, chronic back pain and comes in because of weakness.  He has fallen twice today.  He states that he has pain in his knees which is chronic and he has swelling in his legs which is chronic.  4 days ago, he had been advised to increase his torsemide from 60 mg in the morning and 40 mg in the evening, to 60 mg twice a day.  This was done because of approximately 10 pound weight gain.  He has lost about 5 pounds of that since increasing his torsemide dose.  He denies fever chills.  Denies chest pain, nausea, vomiting.  There is no change in chronic dyspnea.  Past Medical History:  Diagnosis Date  . Abdominal hernia    can' t  fix per pt, it is large  . Anxiety   . Arthritis    lower back  . Atrial fibrillation, persistent 07/08/2014  . Back pain   . Cirrhosis ([place])    hx of   . Depression   . Knee pain both knees   wears compression due to edema  . nhl; dx'd 2006   chemo comp, lymphoma  . Non Hodgkin's lymphoma ([place])    hx of   . Peripheral vascular disease ([place])   . Shoulder pain   . Swelling of lower extremity    both legs, uses compression wraps all the time  . Ulcer    left leg, now healed  . Varicose veins     Patient Active Problem List   Diagnosis Date Noted  . Chronic diastolic heart failure ([place]) 12/20/2017  . Special screening for malignant neoplasms, colon 10/24/2014  . Complete rotator cuff tear of left shoulder 07/12/2014  . Complete rotator cuff tear 07/12/2014  . Rotator cuff dysfunction 07/08/2014  . Atrial fibrillation,  persistent 07/08/2014  . Varicose veins of lower extremities with other complications 41/32/4401  . Venous reflux 03/27/2014  . Ulcer of lower limb ([place]) 03/27/2014  . Chronic venous insufficiency 03/27/2014  . Personal history of lymphoma 04/17/2013  . Chronic back pain 04/17/2013  . Acquired lymphedema of leg 04/17/2013  . Osteomyelitis of vertebra ([place]) 04/17/2013    Past Surgical History:  Procedure Laterality Date  . APPENDECTOMY    . biopsy on back at disc     . COLONOSCOPY WITH PROPOFOL N/A 10/24/2014   Procedure: COLONOSCOPY WITH PROPOFOL;  Surgeon: Lear Ng, MD;  Location: [facility];  Service: Endoscopy;  Laterality: N/A;  . ENDOVENOUS ABLATION SAPHENOUS VEIN W/ LASER Left 06-10-2014   EVLA  Left greater saphenous vein by Curt Jews MD    . HERNIA REPAIR  bdomen as child umbilical as adult   x 2   . left elbow surgery      pin out then pin removed   . PORTACATH PLACEMENT  2000   placed and removed  . portacath removal     . RIGHT/LEFT HEART CATH AND CORONARY ANGIOGRAPHY N/A 11/21/2017   Procedure: RIGHT/LEFT HEART CATH AND CORONARY ANGIOGRAPHY;  Surgeon: Larey Dresser, MD;  Location: [place]  CV LAB;  Service: Cardiovascular;  Laterality: N/A;  . SHOULDER OPEN ROTATOR CUFF REPAIR Left 07/12/2014   Procedure: LEFT SHOULDER MINI OPEN ROTATOR CUFF REPAIR  SUBACROMIAL DECOMPRESSION ;  Surgeon: Johnn Hai, MD;  Location: [facility] ORS;  Service: Orthopedics;  Laterality: Left;        Home Medications    Prior to Admission medications   Medication Sig Start Date End Date Taking? Authorizing Provider  cycloSPORINE (RESTASIS) 0.05 % ophthalmic emulsion Place 1 drop into both eyes 2 (two) times daily as needed (for eye irritation/dryness).    [provider]  diclofenac sodium (VOLTAREN) 1 % GEL Apply 1 g topically 4 (four) times daily as needed (for knee pain.).    [provider]  ELIQUIS 5 MG TABS tablet TAKE 1 TABLET(5 MG) BY MOUTH TWICE  DAILY 12/18/18   Larey Dresser, MD  fluticasone [facility]) 50 MCG/ACT nasal spray Place 2 sprays into both nostrils at bedtime.  04/24/12   [provider]  lactulose (CHRONULAC) 10 GM/15ML solution Take 10 g by mouth daily.  11/09/11   [provider]  levothyroxine (SYNTHROID, LEVOTHROID) 125 MCG tablet Take 125 mcg by mouth daily before breakfast.  06/01/17   [provider]  Misc. Devices (ROLLATOR) MISC Use daily with walking 01/13/18   Larey Dresser, MD  nadolol (CORGARD) 20 MG tablet Take 20 mg by mouth daily.     [provider]  NARCAN 4 MG/0.1ML LIQD nasal spray kit Place 1 spray into the nose as needed (For accidental opoid overdose).  09/05/17   [provider]  omega-3 acid ethyl esters (LOVAZA) 1 g capsule Take 2 g by mouth 2 (two) times daily.  06/01/17   [provider]  oxyCODONE (OXYCONTIN) 40 mg 12 hr tablet Take 1 tablet (40 mg total) by mouth every 12 (twelve) hours. 12/06/18   Volanda Napoleon, MD  Oxycodone HCl 20 MG TABS Take 1 tablet (20 mg total) by mouth every 8 (eight) hours as needed. 12/06/18   Volanda Napoleon, MD  spironolactone (ALDACTONE) 100 MG tablet Take 100 mg by mouth 2 (two) times daily.      [provider]  torsemide (DEMADEX) 20 MG tablet Take 3 tablets (60 mg total) by mouth every morning AND 2 tablets (40 mg total) every evening. 10/17/18   Larey Dresser, MD    Family History Family History  Problem Relation Age of Onset  . Cancer Father        pacemaker  . Bradycardia Father     Social History Social History   Tobacco Use  . Smoking status: Former Smoker    Packs/day: 0.50    Years: 8.00    Pack years: 4.00    Types: Cigarettes    Start date: 09/26/1966    Last attempt to quit: 06/26/1989    Years since quitting: 29.5  . Smokeless tobacco: Never Used  . Tobacco comment: quit 38 years ago  Substance Use Topics  . Alcohol use: No    Alcohol/week: 0.0 standard drinks  . Drug  use: No     Allergies   Vancomycin   Review of Systems Review of Systems  Neurological: Positive for weakness.  All other systems reviewed and are negative.    Physical Exam Updated Vital Signs BP 102/67 (BP Location: Left Arm)   Pulse 88   Temp 98 F (36.7 C) (Oral)   Resp (!) 22   Ht 6' (1.829 m)  Wt (!) 196.4 kg   SpO2 95%   BMI 58.73 kg/m   Physical Exam Vitals signs and nursing note reviewed.    Morbidly obese 63 year old male, resting comfortably and in no acute distress. Vital signs are significant for elevated respiratory rate.  Recorded blood pressure is normal, but in the room, blood pressures are consistently in the mid 33A systolic.  Oxygen saturation is 95%, which is normal. Head is normocephalic and atraumatic. PERRLA, EOMI. Oropharynx is clear. Neck is nontender and supple without adenopathy or JVD. Back is nontender and there is no CVA tenderness. Lungs are clear without rales, wheezes, or rhonchi. Chest is nontender. Heart has regular rate and rhythm without murmur. Abdomen is soft, flat, nontender without masses or hepatosplenomegaly and peristalsis is normoactive. Extremities have 4+ edema, full range of motion is present. Skin is warm and dry without rash.  Color is somewhat pale. Neurologic: Mental status is normal, cranial nerves are intact, there are no motor or sensory deficits.  ED Treatments / Results  Labs (all labs ordered are listed, but only abnormal results are displayed) Labs Reviewed  COMPREHENSIVE METABOLIC PANEL - Abnormal; Notable for the following components:      Result Value   Sodium 131 (*)    Chloride 96 (*)    Glucose, Bld 107 (*)    BUN 51 (*)    Creatinine, Ser 2.21 (*)    Total Protein 6.0 (*)    AST 55 (*)    Total Bilirubin 2.6 (*)    GFR calc non Af Amer 30 (*)    GFR calc Af Amer 35 (*)    All other components within normal limits  BRAIN NATRIURETIC PEPTIDE - Abnormal; Notable for the following components:     B Natriuretic Peptide 1,221.0 (*)    All other components within normal limits  CBC WITH DIFFERENTIAL/PLATELET - Abnormal; Notable for the following components:   WBC 10.9 (*)    RDW 23.1 (*)    Platelets 121 (*)    nRBC 0.4 (*)    Neutro Abs 8.6 (*)    Abs Immature Granulocytes 0.15 (*)    All other components within normal limits  URINALYSIS, ROUTINE W REFLEX MICROSCOPIC - Abnormal; Notable for the following components:   Hgb urine dipstick SMALL (*)    Protein, ur 100 (*)    WBC, UA >50 (*)    All other components within normal limits  LACTIC ACID, PLASMA - Abnormal; Notable for the following components:   Lactic Acid, Venous 2.1 (*)    All other components within normal limits  TROPONIN I  LACTIC ACID, PLASMA    EKG EKG Interpretation  Date/Time:  Saturday December 23 2018 00:23:53 EST Ventricular Rate:  61 PR Interval:    QRS Duration: 173 QT Interval:  467 QTC Calculation: 471 R Axis:   127 Text Interpretation:  Atrial fibrillation Ventricular premature complex RBBB and LPFB When compared with ECG of 10/17/2018, Premature ventricular complexes are now present Confirmed by Delora Fuel ([id number]) on 12/23/2018 12:40:22 AM   Radiology Dg Chest Port 1 View  Result Date: 12/23/2018 CLINICAL DATA:  Weakness EXAM: PORTABLE CHEST 1 VIEW COMPARISON:  11/25/2017 FINDINGS: Cardiomegaly with vascular congestion. Interstitial prominence likely reflects early interstitial edema. No effusions or acute bony abnormality. IMPRESSION: Cardiomegaly with vascular congestion and mild interstitial edema. Electronically Signed   By: Rolm Baptise M.D.   On: 12/23/2018 00:36    Procedures Procedures  CRITICAL CARE Performed by: Shanon Brow  Ceira Hoeschen Total critical care time: 40 minutes Critical care time was exclusive of separately billable procedures and treating other patients. Critical care was necessary to treat or prevent imminent or life-threatening deterioration. Critical care was time spent  personally by me on the following activities: development of treatment plan with patient and/or surrogate as well as nursing, discussions with consultants, evaluation of patient's response to treatment, examination of patient, obtaining history from patient or surrogate, ordering and performing treatments and interventions, ordering and review of laboratory studies, ordering and review of radiographic studies, pulse oximetry and re-evaluation of patient's condition.  Medications Ordered in ED Medications  sodium chloride 0.9 % bolus 1,000 mL (0 mLs Intravenous Stopped 12/23/18 0243)     Initial Impression / Assessment and Plan / ED Course  I have reviewed the triage vital signs and the nursing notes.  Pertinent labs & imaging results that were available during my care of the patient were reviewed by me and considered in my medical decision making (see chart for details).  Weakness and hypotension of uncertain cause.  Old records are reviewed, and his heart failure is predominantly right-sided and this may be sensitive to excess diuresis.  He will be given some IV fluid in spite of his significant edema.  Screening labs are ordered.  Blood pressures have generally run about 283 systolic in heart failure clinic, although he did have an oncology visit where blood pressures reported at 93/75.  We will also check urinalysis to look for occult urinary tract infection.  ECG shows non-atrial fibrillation, no acute changes.  Urinalysis shows no evidence of infection.  Labs show renal insufficiency not significantly changed from baseline, hyponatremia not significantly changed from baseline, thrombocytopenia not significantly changed from baseline.  BNP is massively elevated.  Lactic acid is mildly elevated and this is felt to be due to hypotension and not sepsis.  Blood pressure is come up modestly with IV fluids.  He presents with a very difficult combination of low blood pressure with obvious fluid overload.   With right heart failure, some degree of fluid overload may be necessary to maintain adequate cardiac output.  He will need to be admitted and transferred to [facility] where cardiology can assist in management.  Case is discussed with Dr. Darrick Meigs of [facility] hospitalist, who agrees to admit the patient.  Final Clinical Impressions(s) / ED Diagnoses   Final diagnoses:  None    ED Discharge Orders    None       Delora Fuel, MD 15/17/61 [phone]

## 2018-12-22 NOTE — ED Triage Notes (Signed)
Pt has had a couple of falls today, has been assisted by fire and ems x 3 for same, tonight was assisted up from a stool he had been sitting on for several hours d/t the weakness.  Pt denies pain or injury from falling today.

## 2018-12-23 ENCOUNTER — Emergency Department (HOSPITAL_COMMUNITY): Payer: Medicare Other

## 2018-12-23 DIAGNOSIS — J449 Chronic obstructive pulmonary disease, unspecified: Secondary | ICD-10-CM | POA: Diagnosis present

## 2018-12-23 DIAGNOSIS — N289 Disorder of kidney and ureter, unspecified: Secondary | ICD-10-CM | POA: Diagnosis not present

## 2018-12-23 DIAGNOSIS — C8590 Non-Hodgkin lymphoma, unspecified, unspecified site: Secondary | ICD-10-CM | POA: Diagnosis present

## 2018-12-23 DIAGNOSIS — I509 Heart failure, unspecified: Secondary | ICD-10-CM

## 2018-12-23 DIAGNOSIS — I2609 Other pulmonary embolism with acute cor pulmonale: Secondary | ICD-10-CM | POA: Diagnosis not present

## 2018-12-23 DIAGNOSIS — I50813 Acute on chronic right heart failure: Principal | ICD-10-CM

## 2018-12-23 DIAGNOSIS — R234 Changes in skin texture: Secondary | ICD-10-CM | POA: Diagnosis present

## 2018-12-23 DIAGNOSIS — I959 Hypotension, unspecified: Secondary | ICD-10-CM | POA: Diagnosis not present

## 2018-12-23 DIAGNOSIS — I4821 Permanent atrial fibrillation: Secondary | ICD-10-CM | POA: Diagnosis present

## 2018-12-23 DIAGNOSIS — K7469 Other cirrhosis of liver: Secondary | ICD-10-CM | POA: Diagnosis not present

## 2018-12-23 DIAGNOSIS — E876 Hypokalemia: Secondary | ICD-10-CM | POA: Diagnosis not present

## 2018-12-23 DIAGNOSIS — I2781 Cor pulmonale (chronic): Secondary | ICD-10-CM | POA: Diagnosis present

## 2018-12-23 DIAGNOSIS — I5033 Acute on chronic diastolic (congestive) heart failure: Secondary | ICD-10-CM | POA: Diagnosis present

## 2018-12-23 DIAGNOSIS — M549 Dorsalgia, unspecified: Secondary | ICD-10-CM | POA: Diagnosis present

## 2018-12-23 DIAGNOSIS — R296 Repeated falls: Secondary | ICD-10-CM | POA: Diagnosis present

## 2018-12-23 DIAGNOSIS — E871 Hypo-osmolality and hyponatremia: Secondary | ICD-10-CM | POA: Insufficient documentation

## 2018-12-23 DIAGNOSIS — I872 Venous insufficiency (chronic) (peripheral): Secondary | ICD-10-CM | POA: Diagnosis present

## 2018-12-23 DIAGNOSIS — E039 Hypothyroidism, unspecified: Secondary | ICD-10-CM | POA: Diagnosis present

## 2018-12-23 DIAGNOSIS — Z6841 Body Mass Index (BMI) 40.0 and over, adult: Secondary | ICD-10-CM | POA: Diagnosis not present

## 2018-12-23 DIAGNOSIS — J9611 Chronic respiratory failure with hypoxia: Secondary | ICD-10-CM | POA: Diagnosis present

## 2018-12-23 DIAGNOSIS — F419 Anxiety disorder, unspecified: Secondary | ICD-10-CM | POA: Diagnosis present

## 2018-12-23 DIAGNOSIS — E662 Morbid (severe) obesity with alveolar hypoventilation: Secondary | ICD-10-CM | POA: Diagnosis present

## 2018-12-23 DIAGNOSIS — I890 Lymphedema, not elsewhere classified: Secondary | ICD-10-CM | POA: Diagnosis present

## 2018-12-23 DIAGNOSIS — G8929 Other chronic pain: Secondary | ICD-10-CM | POA: Diagnosis present

## 2018-12-23 DIAGNOSIS — I739 Peripheral vascular disease, unspecified: Secondary | ICD-10-CM | POA: Diagnosis present

## 2018-12-23 DIAGNOSIS — Z7901 Long term (current) use of anticoagulants: Secondary | ICD-10-CM | POA: Insufficient documentation

## 2018-12-23 DIAGNOSIS — I2729 Other secondary pulmonary hypertension: Secondary | ICD-10-CM | POA: Diagnosis present

## 2018-12-23 DIAGNOSIS — I071 Rheumatic tricuspid insufficiency: Secondary | ICD-10-CM | POA: Diagnosis present

## 2018-12-23 DIAGNOSIS — N183 Chronic kidney disease, stage 3 (moderate): Secondary | ICD-10-CM | POA: Diagnosis present

## 2018-12-23 DIAGNOSIS — I482 Chronic atrial fibrillation, unspecified: Secondary | ICD-10-CM | POA: Insufficient documentation

## 2018-12-23 DIAGNOSIS — K7581 Nonalcoholic steatohepatitis (NASH): Secondary | ICD-10-CM | POA: Diagnosis present

## 2018-12-23 LAB — COMPREHENSIVE METABOLIC PANEL
ALBUMIN: 3.6 g/dL (ref 3.5–5.0)
ALT: 36 U/L (ref 0–44)
AST: 55 U/L — ABNORMAL HIGH (ref 15–41)
Alkaline Phosphatase: 79 U/L (ref 38–126)
Anion gap: 13 (ref 5–15)
BILIRUBIN TOTAL: 2.6 mg/dL — AB (ref 0.3–1.2)
BUN: 51 mg/dL — ABNORMAL HIGH (ref 8–23)
CO2: 22 mmol/L (ref 22–32)
Calcium: 8.9 mg/dL (ref 8.9–10.3)
Chloride: 96 mmol/L — ABNORMAL LOW (ref 98–111)
Creatinine, Ser: 2.21 mg/dL — ABNORMAL HIGH (ref 0.61–1.24)
GFR calc Af Amer: 35 mL/min — ABNORMAL LOW (ref >60)
GFR calc non Af Amer: 30 mL/min — ABNORMAL LOW (ref >60)
Glucose, Bld: 107 mg/dL — ABNORMAL HIGH (ref 70–99)
Potassium: 4.2 mmol/L (ref 3.5–5.1)
Sodium: 131 mmol/L — ABNORMAL LOW (ref 135–145)
Total Protein: 6 g/dL — ABNORMAL LOW (ref 6.5–8.1)

## 2018-12-23 LAB — URINALYSIS, ROUTINE W REFLEX MICROSCOPIC
Bacteria, UA: NONE SEEN
Bilirubin Urine: NEGATIVE
Glucose, UA: NEGATIVE mg/dL
Ketones, ur: NEGATIVE mg/dL
Leukocytes, UA: NEGATIVE
Nitrite: NEGATIVE
Protein, ur: 100 mg/dL — AB
Specific Gravity, Urine: 1.009 (ref 1.005–1.030)
WBC, UA: >50 WBC/hpf — ABNORMAL HIGH (ref 0–5)
pH: 5 (ref 5.0–8.0)

## 2018-12-23 LAB — CBC WITH DIFFERENTIAL/PLATELET
Abs Immature Granulocytes: 0.15 10*3/uL — ABNORMAL HIGH (ref 0.00–0.07)
Basophils Absolute: 0 10*3/uL (ref 0.0–0.1)
Basophils Relative: 0 %
EOS ABS: 0 10*3/uL (ref 0.0–0.5)
Eosinophils Relative: 0 %
HCT: 48.1 % (ref 39.0–52.0)
Hemoglobin: 14.8 g/dL (ref 13.0–17.0)
IMMATURE GRANULOCYTES: 1 %
Lymphocytes Relative: 11 %
Lymphs Abs: 1.2 10*3/uL (ref 0.7–4.0)
MCH: 29 pg (ref 26.0–34.0)
MCHC: 30.8 g/dL (ref 30.0–36.0)
MCV: 94.3 fL (ref 80.0–100.0)
Monocytes Absolute: 1 10*3/uL (ref 0.1–1.0)
Monocytes Relative: 9 %
Neutro Abs: 8.6 10*3/uL — ABNORMAL HIGH (ref 1.7–7.7)
Neutrophils Relative %: 79 %
Platelets: 121 10*3/uL — ABNORMAL LOW (ref 150–400)
RBC: 5.1 MIL/uL (ref 4.22–5.81)
RDW: 23.1 % — AB (ref 11.5–15.5)
WBC: 10.9 10*3/uL — ABNORMAL HIGH (ref 4.0–10.5)
nRBC: 0.4 % — ABNORMAL HIGH (ref 0.0–0.2)

## 2018-12-23 LAB — MRSA PCR SCREENING: MRSA by PCR: POSITIVE — AB

## 2018-12-23 LAB — LACTIC ACID, PLASMA
LACTIC ACID, VENOUS: 2.1 mmol/L — AB (ref 0.5–1.9)
Lactic Acid, Venous: 2.1 mmol/L (ref 0.5–1.9)

## 2018-12-23 LAB — BRAIN NATRIURETIC PEPTIDE: B Natriuretic Peptide: 1221 pg/mL — ABNORMAL HIGH (ref 0.0–100.0)

## 2018-12-23 LAB — TROPONIN I: Troponin I: <0.03 ng/mL (ref <0.03)

## 2018-12-23 MED ORDER — OXYCODONE HCL 5 MG PO TABS
20.0000 mg | ORAL_TABLET | Freq: Three times a day (TID) | ORAL | Status: DC | PRN
  Administered 2018-12-24 – 2018-12-30 (×10): 20 mg via ORAL
  Filled 2018-12-23 (×10): qty 4

## 2018-12-23 MED ORDER — ONDANSETRON HCL 4 MG PO TABS: 4.0000 mg | ORAL_TABLET | Freq: Four times a day (QID) | ORAL | Status: DC | PRN

## 2018-12-23 MED ORDER — SODIUM CHLORIDE 0.9% FLUSH: 3.0000 mL | INTRAVENOUS | Status: DC | PRN

## 2018-12-23 MED ORDER — APIXABAN 5 MG PO TABS
5.0000 mg | ORAL_TABLET | Freq: Two times a day (BID) | ORAL | Status: DC
  Administered 2018-12-23 – 2018-12-30 (×15): 5 mg via ORAL
  Filled 2018-12-23 (×15): qty 1

## 2018-12-23 MED ORDER — NADOLOL 20 MG PO TABS
20.0000 mg | ORAL_TABLET | Freq: Every day | ORAL | Status: DC
  Filled 2018-12-23 (×3): qty 1

## 2018-12-23 MED ORDER — SPIRONOLACTONE 100 MG PO TABS
100.0000 mg | ORAL_TABLET | Freq: Two times a day (BID) | ORAL | Status: DC
  Filled 2018-12-23 (×5): qty 1

## 2018-12-23 MED ORDER — CYCLOSPORINE 0.05 % OP EMUL
1.0000 [drp] | Freq: Two times a day (BID) | OPHTHALMIC | Status: DC | PRN
  Filled 2018-12-23: qty 1

## 2018-12-23 MED ORDER — SODIUM CHLORIDE 0.9 % IV BOLUS
500.0000 mL | Freq: Once | INTRAVENOUS | Status: AC
  Administered 2018-12-23: 500 mL via INTRAVENOUS

## 2018-12-23 MED ORDER — CHLORHEXIDINE GLUCONATE CLOTH 2 % EX PADS
6.0000 | MEDICATED_PAD | Freq: Every day | CUTANEOUS | Status: AC
  Administered 2018-12-24 – 2018-12-28 (×5): 6 via TOPICAL

## 2018-12-23 MED ORDER — LEVOTHYROXINE SODIUM 25 MCG PO TABS
125.0000 ug | ORAL_TABLET | Freq: Every day | ORAL | Status: DC
  Administered 2018-12-23: 125 ug via ORAL
  Filled 2018-12-23: qty 3

## 2018-12-23 MED ORDER — OXYCODONE HCL ER 10 MG PO T12A: 40.0000 mg | EXTENDED_RELEASE_TABLET | Freq: Two times a day (BID) | ORAL | Status: DC

## 2018-12-23 MED ORDER — SODIUM CHLORIDE 0.9 % IV SOLN: 250.0000 mL | INTRAVENOUS | Status: DC | PRN

## 2018-12-23 MED ORDER — FLUTICASONE PROPIONATE 50 MCG/ACT NA SUSP
2.0000 | Freq: Every day | NASAL | Status: DC
  Administered 2018-12-25 – 2018-12-27 (×3): 2 via NASAL
  Filled 2018-12-23 (×2): qty 16

## 2018-12-23 MED ORDER — SPIRONOLACTONE 25 MG PO TABS
100.0000 mg | ORAL_TABLET | Freq: Two times a day (BID) | ORAL | Status: DC
  Administered 2018-12-24 – 2018-12-29 (×12): 100 mg via ORAL
  Filled 2018-12-23 (×4): qty 4
  Filled 2018-12-23: qty 1
  Filled 2018-12-23: qty 4
  Filled 2018-12-23: qty 1
  Filled 2018-12-23 (×7): qty 4
  Filled 2018-12-23: qty 1
  Filled 2018-12-23: qty 4

## 2018-12-23 MED ORDER — LEVOTHYROXINE SODIUM 25 MCG PO TABS
125.0000 ug | ORAL_TABLET | Freq: Every day | ORAL | Status: DC
  Administered 2018-12-24 – 2018-12-30 (×7): 125 ug via ORAL
  Filled 2018-12-23 (×7): qty 1

## 2018-12-23 MED ORDER — ACETAMINOPHEN 325 MG PO TABS: 650.0000 mg | ORAL_TABLET | Freq: Four times a day (QID) | ORAL | Status: DC | PRN

## 2018-12-23 MED ORDER — ONDANSETRON HCL 4 MG/2ML IJ SOLN: 4.0000 mg | Freq: Four times a day (QID) | INTRAMUSCULAR | Status: DC | PRN

## 2018-12-23 MED ORDER — LACTULOSE 10 GM/15ML PO SOLN
10.0000 g | Freq: Every day | ORAL | Status: DC
  Administered 2018-12-23 – 2018-12-30 (×8): 10 g via ORAL
  Filled 2018-12-23: qty 30
  Filled 2018-12-23 (×7): qty 15

## 2018-12-23 MED ORDER — SODIUM CHLORIDE 0.9 % IV BOLUS
1000.0000 mL | Freq: Once | INTRAVENOUS | Status: AC
  Administered 2018-12-23: 1000 mL via INTRAVENOUS

## 2018-12-23 MED ORDER — MUPIROCIN 2 % EX OINT
1.0000 "application " | TOPICAL_OINTMENT | Freq: Two times a day (BID) | CUTANEOUS | Status: AC
  Administered 2018-12-24 – 2018-12-28 (×10): 1 via NASAL
  Filled 2018-12-23 (×3): qty 22

## 2018-12-23 MED ORDER — ACETAMINOPHEN 650 MG RE SUPP: 650.0000 mg | Freq: Four times a day (QID) | RECTAL | Status: DC | PRN

## 2018-12-23 MED ORDER — SODIUM CHLORIDE 0.9% FLUSH
3.0000 mL | Freq: Two times a day (BID) | INTRAVENOUS | Status: DC
  Administered 2018-12-23 (×2): 3 mL via INTRAVENOUS

## 2018-12-23 NOTE — ED Notes (Signed)
Patient called to use urinal, went in room to assist patient with urinal patient states that he was not able to void after 5 minutes of trying. Reminded patient to call he needs to go again.

## 2018-12-23 NOTE — ED Notes (Signed)
Patient given oral fluids and graham crackers.

## 2018-12-23 NOTE — ED Notes (Addendum)
Hospitalist here to assess patient. Aware of patient has not had output since 1205.

## 2018-12-23 NOTE — H&P (Signed)
TRH H&P    Patient Demographics:    Albert James, is a 63 y.o. male  MRN: [id number]  DOB - [birth year]  Admit Date - 12/22/2018  Referring MD/NP/PA: Dr Roxanne Mins  Outpatient Primary MD for the patient is Mayra Neer, MD  Patient coming from: Home  Chief complaint- Weakness   HPI:    Albert James  is a 63 y.o. male, with h/o chronic diastolic CHF, atrial fibrillation, non hodgkin's lymphoma , liver cirrhosis from NASH, peripheral vascular disease, chronic back pain came to hospital with complaint of generalized weakness. He is followed by cardiology as outpatient. He was told by cardiologist 4 days ago to increase the dose of Torsemide to 60 mg po twice daily, as patient had gained about 10 lbs. Patient says that he has lost about 5 lbs since starting increased dose of Torsemide. Patient says that he has been falling at home, and he fell twice in past one week. He denies chest pain, no nausea, vomiting or diarrhea. He does complain of shortness of breath No h/o DM. He does have non hodgkin lymphoma in remission. He denies abdominal pian, dysuria   Review of systems:    In addition to the HPI above,   All other systems reviewed and are negative.    Past History of the following :    Past Medical History:  Diagnosis Date  . Abdominal hernia    can' t  fix per pt, it is large  . Anxiety   . Arthritis    lower back  . Atrial fibrillation, persistent 07/08/2014  . Back pain   . Cirrhosis ([place])    hx of   . Depression   . Knee pain both knees   wears compression due to edema  . nhl; dx'd 2006   chemo comp, lymphoma  . Non Hodgkin's lymphoma ([place])    hx of   . Peripheral vascular disease ([place])   . Shoulder pain   . Swelling of lower extremity    both legs, uses compression wraps all the time  . Ulcer    left leg, now healed  . Varicose veins       Past Surgical History:    Procedure Laterality Date  . APPENDECTOMY    . biopsy on back at disc     . COLONOSCOPY WITH PROPOFOL N/A 10/24/2014   Procedure: COLONOSCOPY WITH PROPOFOL;  Surgeon: Lear Ng, MD;  Location: [facility];  Service: Endoscopy;  Laterality: N/A;  . ENDOVENOUS ABLATION SAPHENOUS VEIN W/ LASER Left 06-10-2014   EVLA  Left greater saphenous vein by Curt Jews MD    . HERNIA REPAIR  bdomen as child umbilical as adult   x 2   . left elbow surgery      pin out then pin removed   . PORTACATH PLACEMENT  2000   placed and removed  . portacath removal     . RIGHT/LEFT HEART CATH AND CORONARY ANGIOGRAPHY N/A 11/21/2017   Procedure: RIGHT/LEFT HEART CATH AND CORONARY ANGIOGRAPHY;  Surgeon: Larey Dresser,  MD;  Location: [place];  Service: Cardiovascular;  Laterality: N/A;  . SHOULDER OPEN ROTATOR CUFF REPAIR Left 07/12/2014   Procedure: LEFT SHOULDER MINI OPEN ROTATOR CUFF REPAIR  SUBACROMIAL DECOMPRESSION ;  Surgeon: Johnn Hai, MD;  Location: [facility];  Service: Orthopedics;  Laterality: Left;      Social History:      Social History   Tobacco Use  . Smoking status: Former Smoker    Packs/day: 0.50    Years: 8.00    Pack years: 4.00    Types: Cigarettes    Start date: 09/26/1966    Last attempt to quit: 06/26/1989    Years since quitting: 29.5  . Smokeless tobacco: Never Used  . Tobacco comment: quit 38 years ago  Substance Use Topics  . Alcohol use: No    Alcohol/week: 0.0 standard drinks       Family History :     Family History  Problem Relation Age of Onset  . Cancer Father        pacemaker  . Bradycardia Father       Home Medications:   Prior to Admission medications   Medication Sig Start Date End Date Taking? Authorizing Provider  cycloSPORINE (RESTASIS) 0.05 % ophthalmic emulsion Place 1 drop into both eyes 2 (two) times daily as needed (for eye irritation/dryness).    [provider]  diclofenac sodium (VOLTAREN) 1 % GEL  Apply 1 g topically 4 (four) times daily as needed (for knee pain.).    [provider]  ELIQUIS 5 MG TABS tablet TAKE 1 TABLET(5 MG) BY MOUTH TWICE DAILY 12/18/18   Larey Dresser, MD  fluticasone [facility]) 50 MCG/ACT nasal spray Place 2 sprays into both nostrils at bedtime.  04/24/12   [provider]  lactulose (CHRONULAC) 10 GM/15ML solution Take 10 g by mouth daily.  11/09/11   [provider]  levothyroxine (SYNTHROID, LEVOTHROID) 125 MCG tablet Take 125 mcg by mouth daily before breakfast.  06/01/17   [provider]  Misc. Devices (ROLLATOR) MISC Use daily with walking 01/13/18   Larey Dresser, MD  nadolol (CORGARD) 20 MG tablet Take 20 mg by mouth daily.     [provider]  NARCAN 4 MG/0.1ML LIQD nasal spray kit Place 1 spray into the nose as needed (For accidental opoid overdose).  09/05/17   [provider]  omega-3 acid ethyl esters (LOVAZA) 1 g capsule Take 2 g by mouth 2 (two) times daily.  06/01/17   [provider]  oxyCODONE (OXYCONTIN) 40 mg 12 hr tablet Take 1 tablet (40 mg total) by mouth every 12 (twelve) hours. 12/06/18   Volanda Napoleon, MD  Oxycodone HCl 20 MG TABS Take 1 tablet (20 mg total) by mouth every 8 (eight) hours as needed. 12/06/18   Volanda Napoleon, MD  spironolactone (ALDACTONE) 100 MG tablet Take 100 mg by mouth 2 (two) times daily.      [provider]  torsemide (DEMADEX) 20 MG tablet Take 3 tablets (60 mg total) by mouth every morning AND 2 tablets (40 mg total) every evening. 10/17/18   Larey Dresser, MD     Allergies:     Allergies  Allergen Reactions  . Vancomycin Other (See Comments)    "red man" syndrome (when in large quantity)     Physical Exam:   Vitals  Blood pressure (!) 83/58, pulse 70, temperature 98 F (36.7 C), temperature source Oral, resp. rate 18,  height 6' (1.829 m), weight (!) 196.4 kg, SpO2 91 %.  1.  General: Appears lethargic  2.  Psychiatric: Alert, oriented x3 intact insight and judgment  3. Neurologic: Cranial nerves II through grossly intact, motor strength 5/5 in upper and lower extremities  4. HEENMT:  Atraumatic normocephalic, extra ocular muscles, intact, oral mucosa is dry  5. Respiratory : Clear to auscultation bilaterally  6. Cardiovascular : S1-S2, regular, no murmur auscultated  7. Gastrointestinal:  Abdomen is soft, distended, no organomegaly  8. Skin:  Erythema noted on the lower extremities, nontender palpation  9.Musculoskeletal:  Bilateral lower extremity edema    Data Review:    CBC Recent Labs  Lab 12/23/18 0004  WBC 10.9*  HGB 14.8  HCT 48.1  PLT 121*  MCV 94.3  MCH 29.0  MCHC 30.8  RDW 23.1*  LYMPHSABS 1.2  MONOABS 1.0  EOSABS 0.0  BASOSABS 0.0   ------------------------------------------------------------------------------------------------------------------  Results for orders placed or performed during the hospital encounter of 12/22/18 (from the past 48 hour(s))  Lactic acid, plasma     Status: Abnormal   Collection Time: 12/22/18 11:59 PM  Result Value Ref Range   Lactic Acid, Venous 2.1 (HH) 0.5 - 1.9 mmol/L    Comment: CRITICAL RESULT CALLED TO, READ BACK BY AND VERIFIED WITH: NORMAN,B @ 0103 ON 12/23/18 BY JUW Performed at [facility], [address] [place], [place] [address]   Comprehensive metabolic panel     Status: Abnormal   Collection Time: 12/23/18 12:04 AM  Result Value Ref Range   Sodium 131 (L) 135 - 145 mmol/L   Potassium 4.2 3.5 - 5.1 mmol/L   Chloride 96 (L) 98 - 111 mmol/L   CO2 22 22 - 32 mmol/L   Glucose, Bld 107 (H) 70 - 99 mg/dL   BUN 51 (H) 8 - 23 mg/dL   Creatinine, Ser 2.21 (H) 0.61 - 1.24 mg/dL   Calcium 8.9 8.9 - 10.3 mg/dL   Total Protein 6.0 (L) 6.5 - 8.1 g/dL   Albumin 3.6 3.5 - 5.0 g/dL   AST 55 (H) 15 - 41 U/L   ALT 36 0 - 44 U/L   Alkaline Phosphatase 79 38 - 126 U/L   Total Bilirubin 2.6 (H) 0.3 - 1.2 mg/dL   GFR  calc non Af Amer 30 (L) >60 mL/min   GFR calc Af Amer 35 (L) >60 mL/min   Anion gap 13 5 - 15    Comment: Performed at [facility], [address], [place], [place] [address]  Brain natriuretic peptide     Status: Abnormal   Collection Time: 12/23/18 12:04 AM  Result Value Ref Range   B Natriuretic Peptide 1,221.0 (H) 0.0 - 100.0 pg/mL    Comment: Performed at [facility], [address], [place], [place] [address]  CBC with Differential     Status: Abnormal   Collection Time: 12/23/18 12:04 AM  Result Value Ref Range   WBC 10.9 (H) 4.0 - 10.5 K/uL   RBC 5.10 4.22 - 5.81 MIL/uL   Hemoglobin 14.8 13.0 - 17.0 g/dL   HCT 48.1 39.0 - 52.0 %   MCV 94.3 80.0 - 100.0 fL   MCH 29.0 26.0 - 34.0 pg   MCHC 30.8 30.0 - 36.0 g/dL   RDW 23.1 (H) 11.5 - 15.5 %   Platelets 121 (L) 150 - 400 K/uL    Comment: SPECIMEN CHECKED FOR CLOTS   nRBC 0.4 (H) 0.0 - 0.2 %   Neutrophils  Relative % 79 %   Neutro Abs 8.6 (H) 1.7 - 7.7 K/uL   Lymphocytes Relative 11 %   Lymphs Abs 1.2 0.7 - 4.0 K/uL   Monocytes Relative 9 %   Monocytes Absolute 1.0 0.1 - 1.0 K/uL   Eosinophils Relative 0 %   Eosinophils Absolute 0.0 0.0 - 0.5 K/uL   Basophils Relative 0 %   Basophils Absolute 0.0 0.0 - 0.1 K/uL   Immature Granulocytes 1 %   Abs Immature Granulocytes 0.15 (H) 0.00 - 0.07 K/uL    Comment: Performed at [facility], [address], [place], [place] [address]  Troponin I - Once     Status: None   Collection Time: 12/23/18 12:04 AM  Result Value Ref Range   Troponin I <0.03 <0.03 ng/mL    Comment: Performed at [facility], [address] [place], [place] [address]  Urinalysis, Routine w reflex microscopic     Status: Abnormal   Collection Time: 12/23/18 12:30 AM  Result Value Ref Range   Color, Urine YELLOW YELLOW   APPearance CLEAR CLEAR   Specific Gravity, Urine 1.009 1.005 - 1.030   pH 5.0 5.0 - 8.0   Glucose, UA NEGATIVE NEGATIVE mg/dL   Hgb urine dipstick SMALL (A) NEGATIVE   Bilirubin Urine  NEGATIVE NEGATIVE   Ketones, ur NEGATIVE NEGATIVE mg/dL   Protein, ur 100 (A) NEGATIVE mg/dL   Nitrite NEGATIVE NEGATIVE   Leukocytes, UA NEGATIVE NEGATIVE   RBC / HPF 0-5 0 - 5 RBC/hpf   WBC, UA >50 (H) 0 - 5 WBC/hpf   Bacteria, UA NONE SEEN NONE SEEN   Squamous Epithelial / LPF 0-5 0 - 5   Hyaline Casts, UA PRESENT     Comment: Performed at [facility], [address] [place], [place] [address]  Lactic acid, plasma     Status: Abnormal   Collection Time: 12/23/18  3:28 AM  Result Value Ref Range   Lactic Acid, Venous 2.1 (HH) 0.5 - 1.9 mmol/L    Comment: CRITICAL RESULT CALLED TO, READ BACK BY AND VERIFIED WITH: HARDEN,J @ 0429 ON 12/23/18 BY JUW Performed at [facility], [address], [place], [place] [address]     Chemistries  Recent Labs  Lab 12/23/18 0004  NA 131*  K 4.2  CL 96*  CO2 22  GLUCOSE 107*  BUN 51*  CREATININE 2.21*  CALCIUM 8.9  AST 55*  ALT 36  ALKPHOS 79  BILITOT 2.6*   ------------------------------------------------------------------------------------------------------------------  ------------------------------------------------------------------------------------------------------------------ GFR: Estimated Creatinine Clearance: 59.8 mL/min (A) (by C-G formula based on SCr of 2.21 mg/dL (H)). Liver Function Tests: Recent Labs  Lab 12/23/18 0004  AST 55*  ALT 36  ALKPHOS 79  BILITOT 2.6*  PROT 6.0*  ALBUMIN 3.6   No results for input(s): LIPASE, AMYLASE in the last 168 hours. No results for input(s): AMMONIA in the last 168 hours. Coagulation Profile: No results for input(s): INR, PROTIME in the last 168 hours. Cardiac Enzymes: Recent Labs  Lab 12/23/18 0004  TROPONINI <0.03    --------------------------------------------------------------------------------------------------------------- Urine analysis:    Component Value Date/Time   COLORURINE YELLOW 12/23/2018 0030   APPEARANCEUR CLEAR 12/23/2018 0030   LABSPEC 1.009  12/23/2018 0030   LABSPEC 1.015 01/26/2017 1415   PHURINE 5.0 12/23/2018 0030   GLUCOSEU NEGATIVE 12/23/2018 0030   HGBUR SMALL (A) 12/23/2018 0030   BILIRUBINUR NEGATIVE 12/23/2018 0030   KETONESUR NEGATIVE 12/23/2018 0030   PROTEINUR 100 (A) 12/23/2018 0030   UROBILINOGEN 0.2 01/26/2017  [place] 12/23/2018 0030   LEUKOCYTESUR NEGATIVE 12/23/2018 0030      Imaging Results:    Dg Chest Port 1 View  Result Date: 12/23/2018 CLINICAL DATA:  Weakness EXAM: PORTABLE CHEST 1 VIEW COMPARISON:  11/25/2017 FINDINGS: Cardiomegaly with vascular congestion. Interstitial prominence likely reflects early interstitial edema. No effusions or acute bony abnormality. IMPRESSION: Cardiomegaly with vascular congestion and mild interstitial edema. Electronically Signed   By: Rolm Baptise M.D.   On: 12/23/2018 00:36    My personal review of EKG: Rhythm atrial fibrillation   Assessment & Plan:    Active Problems:   CHF exacerbation ([place])   1. Acute on chronic right heart failure/diastolic dysfunction-patient at this time appears very dry, blood pressure is soft with systolic blood pressure in 80s.  No diuretic started in the ED, lungs are clear to auscultation.  Chest x-ray showed only mild interstitial edema.  Patient will need cardiology evaluation with possible inotropic support along with diuresis.  Patient will be [facility] to [facility] for cardiac evaluation.  2. Atrial fibrillation-heart rate is controlled, continue Eliquis  3. Hypothyroidism-continue Synthroid  4. Chronic back pain-continue OxyContin 40 mg every 12 hours, oxycodone PRN for pain.  5. Liver cirrhosis/[place]-stable, continue lactulose, nadolol    DVT Prophylaxis-   Eliquis   AM Labs Ordered, also please review Full Orders  Family Communication: Admission, patients condition and plan of care including tests being ordered have been discussed with the patient and family members at bedside who indicate  understanding and agree with the plan and Code Status.  Code Status: Full code  Admission status: Inpatient: Based on patients clinical presentation and evaluation of above clinical data, I have made determination that patient meets Inpatient criteria at this time.  Time spent in minutes : 60 min   Oswald Hillock M.D on 12/23/2018 at 5:34 AM

## 2018-12-23 NOTE — Progress Notes (Signed)
COVERAGE NOTE   12/23/2018 3:47 PM  Albert James was seen and examined in ED while he is awaiting to transfer to [facility].   The H&P by the admitting provider, orders, imaging was reviewed.  Please see new orders.  The patient is awaiting transfer to [facility] for cardiology services.  The patient was hypotensive in ED but responded to fluid bolus 500 cc.  Pt is followed closely by heart failure team in [place].  Family is anxious for him to get to [place].  I called [facility] and no beds available but patient is on a list.  He should be able to transfer today per bed flow managers.  Will continue to follow.   Vitals:   12/23/18 1415 12/23/18 1500  BP: 104/61 95/64  Pulse: 72 69  Resp: 17 16  Temp:    SpO2: 90% 93%    Results for orders placed or performed during the hospital encounter of 12/22/18  Comprehensive metabolic panel  Result Value Ref Range   Sodium 131 (L) 135 - 145 mmol/L   Potassium 4.2 3.5 - 5.1 mmol/L   Chloride 96 (L) 98 - 111 mmol/L   CO2 22 22 - 32 mmol/L   Glucose, Bld 107 (H) 70 - 99 mg/dL   BUN 51 (H) 8 - 23 mg/dL   Creatinine, Ser 2.21 (H) 0.61 - 1.24 mg/dL   Calcium 8.9 8.9 - 10.3 mg/dL   Total Protein 6.0 (L) 6.5 - 8.1 g/dL   Albumin 3.6 3.5 - 5.0 g/dL   AST 55 (H) 15 - 41 U/L   ALT 36 0 - 44 U/L   Alkaline Phosphatase 79 38 - 126 U/L   Total Bilirubin 2.6 (H) 0.3 - 1.2 mg/dL   GFR calc non Af Amer 30 (L) >60 mL/min   GFR calc Af Amer 35 (L) >60 mL/min   Anion gap 13 5 - 15  Brain natriuretic peptide  Result Value Ref Range   B Natriuretic Peptide 1,221.0 (H) 0.0 - 100.0 pg/mL  CBC with Differential  Result Value Ref Range   WBC 10.9 (H) 4.0 - 10.5 K/uL   RBC 5.10 4.22 - 5.81 MIL/uL   Hemoglobin 14.8 13.0 - 17.0 g/dL   HCT 48.1 39.0 - 52.0 %   MCV 94.3 80.0 - 100.0 fL   MCH 29.0 26.0 - 34.0 pg   MCHC 30.8 30.0 - 36.0 g/dL   RDW 23.1 (H) 11.5 - 15.5 %   Platelets 121 (L) 150 - 400 K/uL   nRBC 0.4 (H) 0.0 - 0.2 %   Neutrophils Relative % 79 %   Neutro Abs 8.6 (H) 1.7 - 7.7 K/uL   Lymphocytes Relative 11 %   Lymphs Abs 1.2 0.7 - 4.0 K/uL   Monocytes Relative 9 %   Monocytes Absolute 1.0 0.1 - 1.0 K/uL   Eosinophils Relative 0 %   Eosinophils Absolute 0.0 0.0 - 0.5 K/uL   Basophils Relative 0 %   Basophils Absolute 0.0 0.0 - 0.1 K/uL   Immature Granulocytes 1 %   Abs Immature Granulocytes 0.15 (H) 0.00 - 0.07 K/uL  Urinalysis, Routine w reflex microscopic  Result Value Ref Range   Color, Urine YELLOW YELLOW   APPearance CLEAR CLEAR   Specific Gravity, Urine 1.009 1.005 - 1.030   pH 5.0 5.0 - 8.0   Glucose, UA NEGATIVE NEGATIVE mg/dL   Hgb urine dipstick SMALL (A) NEGATIVE   Bilirubin Urine NEGATIVE NEGATIVE   Ketones, ur  NEGATIVE NEGATIVE mg/dL   Protein, ur 100 (A) NEGATIVE mg/dL   Nitrite NEGATIVE NEGATIVE   Leukocytes, UA NEGATIVE NEGATIVE   RBC / HPF 0-5 0 - 5 RBC/hpf   WBC, UA >50 (H) 0 - 5 WBC/hpf   Bacteria, UA NONE SEEN NONE SEEN   Squamous Epithelial / LPF 0-5 0 - 5   Hyaline Casts, UA PRESENT   Troponin I - Once  Result Value Ref Range   Troponin I <0.03 <0.03 ng/mL  Lactic acid, plasma  Result Value Ref Range   Lactic Acid, Venous 2.1 (HH) 0.5 - 1.9 mmol/L  Lactic acid, plasma  Result Value Ref Range   Lactic Acid, Venous 2.1 (HH) 0.5 - 1.9 mmol/L   C. Wynetta Emery, MD [facility]   12/22/2018 10:35 PM How to contact the [facility] Attending or Consulting provider [place] or covering provider during after hours [place], for this patient?  1. Check the care team in [facility] and look for a) attending/consulting TRH provider listed and b) the [facility] team listed 2. Log into [email or web address] and use [place] universal password to access. If you do not have the password, please contact the hospital operator. 3. Locate the [facility] provider you are looking for under [facility] and page to a number that you can be directly reached. 4. If you still have difficulty reaching the provider,  please page the [facility] (Director on Call) for the Hospitalists listed on amion for assistance.

## 2018-12-23 NOTE — ED Notes (Signed)
Date and time results received: 12/23/18 04;30 (use smartphrase ".now" to insert current time)  Test: Lactic  Critical Value: 2.1  Name of Provider Notified: Dr. Roxanne Mins  Orders Received? Or Actions Taken?: Provider notified

## 2018-12-23 NOTE — ED Notes (Signed)
Patient has 40 mg's of oxycodone that he is prescribed at home for pain. Patient has medication with him. Family inquired if it would be ok for patient to take his home medication. Dr. Roxanne Mins advised of patient's blood pressure and Dr. Roxanne Mins stated that it would be fine for the patient to take his 40 mg's of oxycodone.

## 2018-12-23 NOTE — ED Notes (Signed)
Report given to Joslyn with [facility].

## 2018-12-23 NOTE — Progress Notes (Signed)
Albert James [id number] Admission Data: 12/23/2018 6:22 PM Attending Provider: Ivor Costa, MD  WNI:OEVO, Albert May, MD Consults/ Treatment Team:   Albert James is a 63 y.o. male patient admitted from ED awake, alert  & orientated  X 3,  Full Code, VSS - Blood pressure 99/79, pulse 72, temperature 97.7 F (36.5 C), temperature source Oral, resp. rate 15, height 6' (1.829 m), weight (!) 196.4 kg, SpO2 95 %., O2    3 L nasal cannular, no c/o shortness of breath, no c/o chest pain, no distress noted. Tele # M01 placed and pt is currently running:atrial fibrillation, with ventricular rate of 87.   IV site WDL:  antecubital left, condition patent and no redness with a transparent dsg that's clean dry and intact.  Allergies:   Allergies  Allergen Reactions  . Vancomycin Other (See Comments)    "red man" syndrome (when in large quantity)     Past Medical History:  Diagnosis Date  . Abdominal hernia    can' t  fix per pt, it is large  . Anxiety   . Arthritis    lower back  . Atrial fibrillation, persistent 07/08/2014  . Back pain   . Cirrhosis ([place])    hx of   . Depression   . Knee pain both knees   wears compression due to edema  . nhl; dx'd 2006   chemo comp, lymphoma  . Non Hodgkin's lymphoma ([place])    hx of   . Peripheral vascular disease ([place])   . Shoulder pain   . Swelling of lower extremity    both legs, uses compression wraps all the time  . Ulcer    left leg, now healed  . Varicose veins     History:  obtained from chart review. Tobacco/alcohol: denied none  Pt orientation to unit, room and routine. Information packet given to patient/family and safety video watched.  Admission INP armband ID verified with patient/family, and in place. SR up x 2, fall risk assessment complete with Patient and family verbalizing understanding of risks associated with falls. Pt verbalizes an understanding of how to use the call bell and to call for help before getting out of  bed.  Skin, clean-dry- intact without evidence of bruising, or skin tears.   No evidence of skin break down noted on exam. hyperpigmentation - bilateral legs, ecchymoses - generalized, bilateral lower extremities are very big, dry and feet are edematous and very dry and cracked. Patient is very obese and has not walked since last Thursday.     Will cont to monitor and assist as needed.  Salley Slaughter, RN 12/23/2018 6:22 PM

## 2018-12-24 DIAGNOSIS — D696 Thrombocytopenia, unspecified: Secondary | ICD-10-CM

## 2018-12-24 DIAGNOSIS — N289 Disorder of kidney and ureter, unspecified: Secondary | ICD-10-CM

## 2018-12-24 LAB — GLUCOSE, CAPILLARY
Glucose-Capillary: 99 mg/dL (ref 70–99)
Glucose-Capillary: 121 mg/dL — ABNORMAL HIGH (ref 70–99)

## 2018-12-24 LAB — BASIC METABOLIC PANEL
Anion gap: 11 (ref 5–15)
BUN: 47 mg/dL — ABNORMAL HIGH (ref 8–23)
CO2: 23 mmol/L (ref 22–32)
Calcium: 8.9 mg/dL (ref 8.9–10.3)
Chloride: 98 mmol/L (ref 98–111)
Creatinine, Ser: 1.87 mg/dL — ABNORMAL HIGH (ref 0.61–1.24)
GFR calc Af Amer: 43 mL/min — ABNORMAL LOW (ref >60)
GFR, EST NON AFRICAN AMERICAN: 37 mL/min — AB (ref >60)
Glucose, Bld: 182 mg/dL — ABNORMAL HIGH (ref 70–99)
Potassium: 4.1 mmol/L (ref 3.5–5.1)
Sodium: 132 mmol/L — ABNORMAL LOW (ref 135–145)

## 2018-12-24 LAB — HIV ANTIBODY (ROUTINE TESTING W REFLEX): HIV Screen 4th Generation wRfx: NONREACTIVE

## 2018-12-24 LAB — CBC
HCT: 44.3 % (ref 39.0–52.0)
HEMOGLOBIN: 13.7 g/dL (ref 13.0–17.0)
MCH: 28.9 pg (ref 26.0–34.0)
MCHC: 30.9 g/dL (ref 30.0–36.0)
MCV: 93.5 fL (ref 80.0–100.0)
Platelets: 143 10*3/uL — ABNORMAL LOW (ref 150–400)
RBC: 4.74 MIL/uL (ref 4.22–5.81)
RDW: 23.1 % — ABNORMAL HIGH (ref 11.5–15.5)
WBC: 6.9 10*3/uL (ref 4.0–10.5)
nRBC: 0.4 % — ABNORMAL HIGH (ref 0.0–0.2)

## 2018-12-24 LAB — MAGNESIUM: Magnesium: 2.7 mg/dL — ABNORMAL HIGH (ref 1.7–2.4)

## 2018-12-24 MED ORDER — NADOLOL 20 MG PO TABS
20.0000 mg | ORAL_TABLET | Freq: Every day | ORAL | Status: DC
  Administered 2018-12-24 – 2018-12-29 (×6): 20 mg via ORAL
  Filled 2018-12-24 (×7): qty 1

## 2018-12-24 MED ORDER — METOLAZONE 2.5 MG PO TABS
2.5000 mg | ORAL_TABLET | Freq: Once | ORAL | Status: AC
  Administered 2018-12-24: 2.5 mg via ORAL
  Filled 2018-12-24: qty 1

## 2018-12-24 MED ORDER — FUROSEMIDE 10 MG/ML IJ SOLN
10.0000 mg/h | INTRAVENOUS | Status: DC
  Administered 2018-12-24: 10 mg/h via INTRAVENOUS
  Filled 2018-12-24: qty 21

## 2018-12-24 NOTE — Progress Notes (Addendum)
PROGRESS NOTE                                                                                                                                                                                                             Patient Demographics:    Albert James, is a 63 y.o. male, DOB - 1956/06/09, YOV:[id number]  Admit date - 12/22/2018   Admitting Physician Oswald Hillock, MD  Outpatient Primary MD for the patient is Mayra Neer, MD  LOS - 1  Chief Complaint  Patient presents with  . Weakness       Brief Narrative  Albert James  is a 63 y.o. male, with h/o chronic diastolic CHF, atrial fibrillation, non hodgkin's lymphoma , liver cirrhosis from NASH, peripheral vascular disease, chronic back pain came to hospital with complaint of generalized weakness, his work-up suggested massive fluid overload and generalized weakness.  He was transferred from [facility] to [facility] for further care.   Subjective:    [name] today has, No headache, No chest pain, No abdominal pain - No Nausea, No new weakness tingling or numbness, No Cough - SOB.     Assessment  & Plan :     1.  Acute on chronic right-sided heart failure along with acute on chronic diastolic CHF.  EF preserved with severe pulmonary hypertension and right-sided heart failure on echocardiogram in December 2019 -massive edema, placed on IV Lasix and Zaroxolyn along with Aldactone, monitor electrolytes weight, limit salt and fluid intake.  Will add Unna boot in 1 to 2 days once he has diuresed a little bit.  Consult cardiology in the morning.  He is on 2 L nasal cannula oxygen at home which will be continued.  2.  Chronic atrial fibrillation.  Mali vas 2 score of 3.  Not on any rate controlling agents, continue Eliquis for stroke prophylaxis.  3.  Hypothyroidism.  On home dose Synthroid.  4.  Albert James, severe pulmonary hypertension, morbid obesity.  Supportive care.  Continue nadolol and  lactulose.  5.  Generalized weakness.  PT eval.  6.  Hyponatremia.  Due to massive fluid overload.  Diurese and monitor.    Family Communication  :  Family bedside  Code Status :  Full  Disposition Plan  :  TBD  Consults  :  Cards  Procedures  :    TTE 10/2018 - - Normal LV systolic function; moderate LVH; D shaped septum;  mildly dilated aortic root; severe RVE with severe  RV dysfunction; severe RAE; severe TR; severe pulmonary hypertension.   DVT Prophylaxis  :  Eliquis  Lab Results  Component Value Date   PLT 143 (L) 12/24/2018    Diet :  Diet Order            Diet Heart Room service appropriate? Yes; Fluid consistency: Thin; Fluid restriction: 1200 mL Fluid  Diet effective now               Inpatient Medications Scheduled Meds: . apixaban  5 mg Oral BID  . Chlorhexidine Gluconate Cloth  6 each Topical Q0600  . fluticasone  2 spray Each Nare QHS  . lactulose  10 g Oral Daily  . levothyroxine  125 mcg Oral Q0600  . metolazone  2.5 mg Oral Once  . mupirocin ointment  1 application Nasal BID  . spironolactone  100 mg Oral BID   Continuous Infusions: . furosemide (LASIX) infusion 10 mg/hr (12/24/18 1144)   PRN Meds:.acetaminophen **OR** [DISCONTINUED] acetaminophen, cycloSPORINE, [DISCONTINUED] ondansetron **OR** ondansetron (ZOFRAN) IV, oxyCODONE  Antibiotics  :   Anti-infectives (From admission, onward)   None          Objective:   Vitals:   12/23/18 2151 12/24/18 0031 12/24/18 0227 12/24/18 0426  BP: (!) 88/63 (!) 89/68 97/64 103/61  Pulse: 82  62 65  Resp: 18 19 19 17   Temp:    97.7 F (36.5 C)  TempSrc:    Oral  SpO2: 94%  92% 100%  Weight:      Height:        Wt Readings from Last 3 Encounters:  12/22/18 (!) 196.4 kg  11/02/18 (!) 190.4 kg  10/17/18 (!) 194.1 kg     Intake/Output Summary (Last 24 hours) at 12/24/2018 1146 Last data filed at 12/24/2018 0549 Gross per 24 hour  Intake 500 ml  Output 1250 ml  Net -750 ml      Physical Exam  Awake Alert, Oriented X 3, No new F.N deficits, Normal affect [place].AT,PERRAL Supple Neck,No JVD, No cervical lymphadenopathy appriciated.  Symmetrical Chest wall movement, Good air movement bilaterally, minimal rales RRR,No Gallops,Rubs or new Murmurs, No Parasternal Heave +ve B.Sounds, Abd Soft, No tenderness, No organomegaly appriciated, No rebound - guarding or rigidity. No Cyanosis, Clubbing , 4+ leg edema    Data Review:    CBC Recent Labs  Lab 12/23/18 0004 12/24/18 0814  WBC 10.9* 6.9  HGB 14.8 13.7  HCT 48.1 44.3  PLT 121* 143*  MCV 94.3 93.5  MCH 29.0 28.9  MCHC 30.8 30.9  RDW 23.1* 23.1*  LYMPHSABS 1.2  --   MONOABS 1.0  --   EOSABS 0.0  --   BASOSABS 0.0  --     Chemistries  Recent Labs  Lab 12/23/18 0004  NA 131*  K 4.2  CL 96*  CO2 22  GLUCOSE 107*  BUN 51*  CREATININE 2.21*  CALCIUM 8.9  AST 55*  ALT 36  ALKPHOS 79  BILITOT 2.6*   ------------------------------------------------------------------------------------------------------------------ No results for input(s): CHOL, HDL, LDLCALC, TRIG, CHOLHDL, LDLDIRECT in the last 72 hours.  No results found for: HGBA1C ------------------------------------------------------------------------------------------------------------------ No results for input(s): TSH, T4TOTAL, T3FREE, THYROIDAB in the last 72 hours.  Invalid input(s): FREET3 ------------------------------------------------------------------------------------------------------------------ No results for input(s): VITAMINB12, FOLATE, FERRITIN, TIBC, IRON, RETICCTPCT in the last 72 hours.  Coagulation profile No results for input(s): INR, PROTIME in the last 168 hours.  No results for input(s): DDIMER in the last 72 hours.  Cardiac Enzymes  Recent Labs  Lab 12/23/18 0004  TROPONINI <0.03   ------------------------------------------------------------------------------------------------------------------     Component Value Date/Time   BNP 1,221.0 (H) 12/23/2018 0004    Micro Results Recent Results (from the past 240 hour(s))  MRSA PCR Screening     Status: Abnormal   Collection Time: 12/23/18  6:57 PM  Result Value Ref Range Status   MRSA by PCR POSITIVE (A) NEGATIVE Final    Comment:        The GeneXpert MRSA Assay (FDA approved for NASAL specimens only), is one component of a comprehensive MRSA colonization surveillance program. It is not intended to diagnose MRSA infection nor to guide or monitor treatment for MRSA infections. RESULT CALLED TO, READ BACK BY AND VERIFIED WITH: OVERBY,M RN [phone] 12/23/2018 MITCHELL,L     Radiology Reports Dg Chest Port 1 View  Result Date: 12/23/2018 CLINICAL DATA:  Weakness EXAM: PORTABLE CHEST 1 VIEW COMPARISON:  11/25/2017 FINDINGS: Cardiomegaly with vascular congestion. Interstitial prominence likely reflects early interstitial edema. No effusions or acute bony abnormality. IMPRESSION: Cardiomegaly with vascular congestion and mild interstitial edema. Electronically Signed   By: Rolm Baptise M.D.   On: 12/23/2018 00:36    Time Spent in minutes  30   Lala Lund M.D on 12/24/2018 at 11:46 AM  To page go to [email or web address] - password [facility]

## 2018-12-24 NOTE — Plan of Care (Signed)
  Problem: Education: Goal: Ability to demonstrate management of disease process will improve Outcome: Progressing Goal: Ability to verbalize understanding of medication therapies will improve Outcome: Progressing Goal: Individualized Educational Video(s) Outcome: Progressing   Problem: Education: Goal: Ability to verbalize understanding of medication therapies will improve Outcome: Progressing   Problem: Activity: Goal: Capacity to carry out activities will improve Outcome: Progressing   Problem: Cardiac: Goal: Ability to achieve and maintain adequate cardiopulmonary perfusion will improve Outcome: Progressing   Problem: Health Behavior/Discharge Planning: Goal: Ability to manage health-related needs will improve Outcome: Progressing   Problem: Clinical Measurements: Goal: Ability to maintain clinical measurements within normal limits will improve Outcome: Progressing Goal: Will remain free from infection Outcome: Progressing Goal: Diagnostic test results will improve Outcome: Progressing Goal: Respiratory complications will improve Outcome: Progressing Goal: Cardiovascular complication will be avoided Outcome: Progressing   Problem: Nutrition: Goal: Adequate nutrition will be maintained Outcome: Progressing   Problem: Coping: Goal: Level of anxiety will decrease Outcome: Progressing   Problem: Elimination: Goal: Will not experience complications related to bowel motility Outcome: Progressing Goal: Will not experience complications related to urinary retention Outcome: Progressing   Problem: Pain Managment: Goal: General experience of comfort will improve Outcome: Progressing   Problem: Pain Managment: Goal: General experience of comfort will improve Outcome: Progressing   Problem: Safety: Goal: Ability to remain free from injury will improve Outcome: Progressing   Problem: Skin Integrity: Goal: Risk for impaired skin integrity will decrease Outcome:  Progressing

## 2018-12-25 DIAGNOSIS — I2609 Other pulmonary embolism with acute cor pulmonale: Secondary | ICD-10-CM

## 2018-12-25 DIAGNOSIS — L8990 Pressure ulcer of unspecified site, unspecified stage: Secondary | ICD-10-CM

## 2018-12-25 LAB — BASIC METABOLIC PANEL
Anion gap: 10 (ref 5–15)
BUN: 46 mg/dL — ABNORMAL HIGH (ref 8–23)
CO2: 23 mmol/L (ref 22–32)
Calcium: 8.5 mg/dL — ABNORMAL LOW (ref 8.9–10.3)
Chloride: 98 mmol/L (ref 98–111)
Creatinine, Ser: 1.72 mg/dL — ABNORMAL HIGH (ref 0.61–1.24)
GFR calc Af Amer: 48 mL/min — ABNORMAL LOW (ref >60)
GFR, EST NON AFRICAN AMERICAN: 41 mL/min — AB (ref >60)
Glucose, Bld: 119 mg/dL — ABNORMAL HIGH (ref 70–99)
Potassium: 3.8 mmol/L (ref 3.5–5.1)
Sodium: 131 mmol/L — ABNORMAL LOW (ref 135–145)

## 2018-12-25 LAB — MAGNESIUM: Magnesium: 2.4 mg/dL (ref 1.7–2.4)

## 2018-12-25 MED ORDER — FUROSEMIDE 10 MG/ML IJ SOLN
20.0000 mg/h | INTRAVENOUS | Status: AC
  Administered 2018-12-25 – 2018-12-26 (×2): 20 mg/h via INTRAVENOUS
  Filled 2018-12-25: qty 21
  Filled 2018-12-25: qty 25

## 2018-12-25 MED ORDER — POTASSIUM CHLORIDE CRYS ER 20 MEQ PO TBCR
20.0000 meq | EXTENDED_RELEASE_TABLET | Freq: Once | ORAL | Status: AC
  Administered 2018-12-25: 20 meq via ORAL
  Filled 2018-12-25: qty 1

## 2018-12-25 MED ORDER — HYDROCERIN EX CREA
TOPICAL_CREAM | Freq: Every day | CUTANEOUS | Status: DC
  Administered 2018-12-26 – 2018-12-29 (×3): via TOPICAL
  Filled 2018-12-25: qty 113

## 2018-12-25 NOTE — Consult Note (Addendum)
Advanced Heart Failure Team Consult Note   Primary Physician: Albert Neer, MD PCP-Cardiologist:  No primary care provider on file.  Reason for Consultation: Acute/Chronic Heart Failure   HPI:    Albert James is seen today for evaluation of heart failure  at the request of Dr Albert James.   Albert James is a 63 year old with a history of permanent atrial fibrillation, NASH with cirrhosis, suspected OHS/OSA, and chronic diastolic CHF/RV failure.   He has been followed in the HF clinic and was last seen 11/02/2018. At that time he was stable and he continued on torsemide 60 mg in am and 40 mg in pm. Weight at that time was 419.    Last week he called the HF clinic with weight gain. He was instructed to increase torsemide to 60 mg twice a day. His daughter says she picks him up Albert James every day. No bleeding problems. Can take a few steps in his house. Uses a lift chair. He has had a couple falls at home.   Unfortunately he continued to feel bad with increased shortness of breath and weakness. He presented to the ED via EMS. CXR concerning for interstitial edema.BNP 1221, K 4,2, Creatinine 2.2, Troponin <0.03.  He was started on intermittent lasix and later changed to lasix drip 10 mg per hour.    Denies SOB.   Echo 10/2018 EF 60-65% RV severely dilated and severely reduced function.   Review of Systems: [y] = yes, _0  = no   . General: Weight gain [ Y]; Weight loss _1 ; Anorexia _2 ; Fatigue [ Y]; Fever _3 ; Chills _4 ; Weakness [Y ]  . Cardiac: Chest pain/pressure _5 ; Resting SOB [Y ]; Exertional SOB [Y ]; Orthopnea _6 ; Pedal Edema [Y ]; Palpitations _7 ; Syncope _8 ; Presyncope _9 ; Paroxysmal nocturnal dyspnea_10   . Pulmonary: Cough _11 ; Wheezing_12 ; Hemoptysis_13 ; Sputum _14 ; Snoring _15   . GI: Vomiting_16 ; Dysphagia_17 ; Melena_18 ; Hematochezia _19 ; Heartburn_20 ; Abdominal pain _21 ; Constipation _22 ; Diarrhea _23 ; BRBPR _24   . GU: Hematuria_25 ; Dysuria _26 ; Nocturia[  ]  . Vascular: Pain in legs with walking _27 ; Pain in feet with lying flat _28 ; Non-healing sores _29 ; Stroke _30 ; TIA _31 ; Slurred speech _32 ;  . Neuro: Headaches_33 ; Vertigo_34 ; Seizures_35 ; Paresthesias_36 ;Blurred vision _37 ; Diplopia _38 ; Vision changes _39   . Ortho/Skin: Arthritis _40 ; Joint pain [ Y]; Muscle pain _41 ; Joint swelling _42 ; Back Pain [Y]; Rash _43   . Psych: Depression[Y ]; Anxiety_44   . Heme: Bleeding problems _45 ; Clotting disorders _46 ; Anemia _47   . Endocrine: Diabetes _48 ; Thyroid dysfunction_49   Home Medications Prior to Admission medications   Medication Sig Start Date End Date Taking? Authorizing Provider  cycloSPORINE (RESTASIS) 0.05 % ophthalmic emulsion Place 1 drop into both eyes 2 (two) times daily as needed (for eye irritation/dryness).   Yes [provider]  diclofenac sodium (VOLTAREN) 1 % GEL Apply 1 g topically 4 (four) times daily as needed (for knee pain.).   Yes [provider]  fluticasone (FLONASE) 50 MCG/ACT nasal spray Place 2 sprays into both nostrils at bedtime.  04/24/12  Yes [provider]  lactulose (CHRONULAC) 10 GM/15ML solution Take 10 g by mouth daily.  11/09/11  Yes [provider]  levothyroxine (SYNTHROID,  LEVOTHROID) 125 MCG tablet Take 125 mcg by mouth daily before breakfast.  06/01/17  Yes [provider]  nadolol (CORGARD) 20 MG tablet Take 20 mg by mouth daily.    Yes [provider]  NARCAN 4 MG/0.1ML LIQD nasal spray kit Place 1 spray into the nose as needed (For accidental opoid overdose).  09/05/17  Yes [provider]  omega-3 acid ethyl esters (LOVAZA) 1 g capsule Take 2 g by mouth 2 (two) times daily.  06/01/17  Yes [provider]  oxyCODONE (OXYCONTIN) 40 mg 12 hr tablet Take 1 tablet (40 mg total) by mouth every 12 (twelve) hours. 12/06/18  Yes Albert James, Albert Cobb, MD  Oxycodone HCl 20 MG TABS Take 1 tablet (20 mg total) by mouth every 8 (eight) hours as needed.  12/06/18  Yes Albert Napoleon, MD  spironolactone (ALDACTONE) 100 MG tablet Take 100 mg by mouth 2 (two) times daily.     Yes [provider]  torsemide (DEMADEX) 20 MG tablet Take 3 tablets (60 mg total) by mouth every morning AND 2 tablets (40 mg total) every evening. Patient taking differently: Take 3 tablets (60 mg total) by mouth every morning AND 3 tablets (60 mg total) every evening. 10/17/18  Yes Larey Dresser, MD  ELIQUIS 5 MG TABS tablet TAKE 1 TABLET(5 MG) BY MOUTH TWICE DAILY 12/18/18   Larey Dresser, MD    Past Medical History: Past Medical History:  Diagnosis Date  . Abdominal hernia    can' t  fix per pt, it is large  . Anxiety   . Arthritis    lower back  . Atrial fibrillation, persistent 07/08/2014  . Back pain   . Cirrhosis ([place])    hx of   . Depression   . Knee pain both knees   wears compression due to edema  . nhl; dx'd 2006   chemo comp, lymphoma  . Non Hodgkin's lymphoma ([place])    hx of   . Peripheral vascular disease ([place])   . Shoulder pain   . Swelling of lower extremity    both legs, uses compression wraps all the time  . Ulcer    left leg, now healed  . Varicose veins     Past Surgical History: Past Surgical History:  Procedure Laterality Date  . APPENDECTOMY    . biopsy on back at disc     . COLONOSCOPY WITH PROPOFOL N/A 10/24/2014   Procedure: COLONOSCOPY WITH PROPOFOL;  Surgeon: Lear Ng, MD;  Location: [facility];  Service: Endoscopy;  Laterality: N/A;  . ENDOVENOUS ABLATION SAPHENOUS VEIN W/ LASER Left 06-10-2014   EVLA  Left greater saphenous vein by Curt Jews MD    . HERNIA REPAIR  bdomen as child umbilical as adult   x 2   . left elbow surgery      pin out then pin removed   . PORTACATH PLACEMENT  2000   placed and removed  . portacath removal     . RIGHT/LEFT HEART CATH AND CORONARY ANGIOGRAPHY N/A 11/21/2017   Procedure: RIGHT/LEFT HEART CATH AND CORONARY ANGIOGRAPHY;  Surgeon: Larey Dresser, MD;   Location: [facility];  Service: Cardiovascular;  Laterality: N/A;  . SHOULDER OPEN ROTATOR CUFF REPAIR Left 07/12/2014   Procedure: LEFT SHOULDER MINI OPEN ROTATOR CUFF REPAIR  SUBACROMIAL DECOMPRESSION ;  Surgeon: Johnn Hai, MD;  Location: [facility];  Service: Orthopedics;  Laterality: Left;    Family History: Family History  Problem Relation Age of Onset  . Cancer Father        pacemaker  . Bradycardia Father     Social History: Social History   Socioeconomic History  . Marital status: Divorced    Spouse name: Not on file  . Number of children: 2  . Years of education: Not on file  . Highest education level: Not on file  Occupational History  . Occupation: disabled  Social Needs  . Financial resource strain: Not on file  . Food insecurity:    Worry: Not on file    Inability: Not on file  . Transportation needs:    Medical: Not on file    Non-medical: Not on file  Tobacco Use  . Smoking status: Former Smoker    Packs/day: 0.50    Years: 8.00    Pack years: 4.00    Types: Cigarettes    Start date: 09/26/1966    Last attempt to quit: 06/26/1989    Years since quitting: 29.5  . Smokeless tobacco: Never Used  . Tobacco comment: quit 38 years ago  Substance and Sexual Activity  . Alcohol use: No    Alcohol/week: 0.0 standard drinks  . Drug use: No  . Sexual activity: Not on file  Lifestyle  . Physical activity:    Days per week: Not on file    Minutes per session: Not on file  . Stress: Not on file  Relationships  . Social connections:    Talks on phone: Not on file    Gets together: Not on file    Attends religious service: Not on file    Active member of club or organization: Not on file    Attends meetings of clubs or organizations: Not on file    Relationship status: Not on file  Other Topics Concern  . Not on file  Social History Narrative  . Not on file    Allergies:  Allergies  Allergen Reactions  . Vancomycin Other (See Comments)     "red man" syndrome (when in large quantity)    Objective:    Vital Signs:   Temp:  [98.6 F (37 C)] 98.6 F (37 C) (02/09 1522) Pulse Rate:  [34-72] 64 (02/10 0904) Resp:  [17-26] 25 (02/10 0904) BP: (97-120)/(67-84) 102/79 (02/10 0904) SpO2:  [92 %-98 %] 98 % (02/10 0904) Weight:  [199.7 kg] 199.7 kg (02/10 0524) Last BM Date: 12/21/18  Weight change: Filed Weights   12/22/18 2240 12/25/18 0524  Weight: (!) 196.4 kg (!) 199.7 kg    Intake/Output:   Intake/Output Summary (Last 24 hours) at 12/25/2018 1025 Last data filed at 12/25/2018 0635 Gross per 24 hour  Intake 163.72 ml  Output 5025 ml  Net -4861.28 ml      Physical Exam    General:  Appears chronically. Sitting in the chair.  No resp difficulty HEENT: normal Neck: supple. JVP to jaw. Carotids 2+ bilat; no bruits. No lymphadenopathy or thyromegaly appreciated. Cor: PMI nondisplaced. Irregular rate & rhythm. No rubs, gallops or murmurs. Lungs: clear on 2 liters  Abdomen: obesity, soft, nontender, nondistended. No hepatosplenomegaly. No bruits or masses. Good bowel sounds. Extremities: no cyanosis, clubbing, rash, R and LLE 3+ edema with compression wraps. . Neuro: alert & orientedx3, cranial nerves grossly intact. moves all 4 extremities w/o difficulty. Affect pleasant   Telemetry   A fib 70s   EKG   A fib 61 bpm personally reviewed.   Labs   Basic Metabolic Panel: Recent  Labs  Lab 12/23/18 0004 12/24/18 1047 12/25/18 0435  NA 131* 132* 131*  K 4.2 4.1 3.8  CL 96* 98 98  CO2 _0 GLUCOSE 107* 182* 119*  BUN 51* 47* 46*  CREATININE 2.21* 1.87* 1.72*  CALCIUM 8.9 8.9 8.5*  MG  --  2.7* 2.4    Liver Function Tests: Recent Labs  Lab 12/23/18 0004  AST 55*  ALT 36  ALKPHOS 79  BILITOT 2.6*  PROT 6.0*  ALBUMIN 3.6   No results for input(s): LIPASE, AMYLASE in the last 168 hours. No results for input(s): AMMONIA in the last 168 hours.  CBC: Recent Labs  Lab 12/23/18 0004  12/24/18 0814  WBC 10.9* 6.9  NEUTROABS 8.6*  --   HGB 14.8 13.7  HCT 48.1 44.3  MCV 94.3 93.5  PLT 121* 143*    Cardiac Enzymes: Recent Labs  Lab 12/23/18 0004  TROPONINI <0.03    BNP: BNP (last 3 results) Recent Labs    12/23/18 0004  BNP 1,221.0*    ProBNP (last 3 results) No results for input(s): PROBNP in the last 8760 hours.   CBG: Recent Labs  Lab 12/24/18 1641 12/24/18 2240  GLUCAP 99 121*    Coagulation Studies: No results for input(s): LABPROT, INR in the last 72 hours.   Imaging    No results found.   Medications:     Current Medications: . apixaban  5 mg Oral BID  . Chlorhexidine Gluconate Cloth  6 each Topical Q0600  . fluticasone  2 spray Each Nare QHS  . hydrocerin   Topical Daily  . lactulose  10 g Oral Daily  . levothyroxine  125 mcg Oral Q0600  . mupirocin ointment  1 application Nasal BID  . nadolol  20 mg Oral Daily  . spironolactone  100 mg Oral BID     Infusions: . furosemide (LASIX) infusion         Patient Profile  Albert Vejar is a 63 year old with a history of permanent atrial fibrillation, NASH with cirrhosis, suspected OHS/OSA, and chronic diastolic CHF/RV failure.   Admitted with volume overload.   Assessment/Plan   1. Acute/Chronic Cor Pulmonale  ECHO 10/2018 EF 60-65% Severe TR, D shpaed septum, Peak PA pressure 89.  Marked volume overload in the setting high sodium diet.  Increase lasix drip to 20 mg per hour. Continue spiro at current dose.  Follow renal function.  Discussed limiting fluid intake to < 2 liters per day and low salt diet. Strict I and Os.   2. Pulmonary HTN PA pressures elevated on ECHO. Suspect WHO Group 3 due to COPD and OSA.  Continue oxygen.   3. OHS/OSA Continue home oxygen and CPAP. On 2 liters chronically.   4. Cirrhosis Suspected NASH versus congestive hepatopathy from RV failure.  Denies ETOH. Cannot totally rule out component of portopulmonary hypertension. He is on  spironolactone at high dose  5. Chronic A fib Rate controlled.  Continue eliquis 5 mg twice a day.   6. CKD Stage III Creatinine baselin ~1.5.   7. Obesity  Body mass index is 59.71 kg/m.    Medication concerns reviewed with patient and pharmacy team. Barriers identified: N/A  Length of Stay: 2  Amy Clegg, NP  12/25/2018, 10:25 AM  Advanced Heart Failure Team Pager [phone] (M-F; 7a - 4p)  Please contact [facility] for night-coverage after hours (4p -7a ) and weekends on [email or web address]  Patient seen and examined with Amy  Ninfa Meeker, NP. We discussed all aspects of the encounter. I agree with the assessment and plan as stated above.    63 y/o male with super morbid obesity (> 400 pounds) with OSA/OHS, permanent AF, NASH with cirrhosis and diastolic/RV failure with cor pulmonale. Seen in HF Clinic several months ago. Since that time has put on almost 35 pounds in fluid due to dietary indiscretion.   On examine Sitting in chair JVP to ear Cor IRR Lungs decreased BS Ab obese +distended Ext 4+ edema into thighs  He is massively volume overloaded. Will increase lasix gtt to 20/hr. Add metolazone as needed. Watch electrolytes closely. Discussed need for fluid restriction (he has 5 drinks on table). Continue apixaban for now. Based on weight may need to consider switching to coumadin so we can monitor adequacy of AC.   Glori Bickers, MD  1:42 PM

## 2018-12-25 NOTE — NC FL2 (Signed)
[place] LEVEL OF CARE SCREENING TOOL     IDENTIFICATION  Patient Name: Albert James Birthdate: 04-26-1956 Sex: male Admission Date (Current Location): 12/22/2018  [facility] [place] Number:  Herbalist and Address:  [facility] [place], [place] [address], [place], [place] [address]      Provider Number: [id number]  Attending Physician Name and Address:  Thurnell Lose, MD  Relative Name and Phone Number:  Thayer Jew, son, [phone]    Current Level of Care: Hospital Recommended Level of Care: [place] Prior Approval Number:    Date Approved/Denied:   PASRR Number: [id number]  Discharge Plan: SNF    Current Diagnoses: Patient Active Problem List   Diagnosis Date Noted  . Acute cor pulmonale ([place]) 12/25/2018  . CHF exacerbation ([place]) 12/23/2018  . Chronic anticoagulation   . Chronic atrial fibrillation   . Hyponatremia   . Hypotension   . Chronic diastolic heart failure ([place]) 12/20/2017  . Special screening for malignant neoplasms, colon 10/24/2014  . Complete rotator cuff tear of left shoulder 07/12/2014  . Complete rotator cuff tear 07/12/2014  . Rotator cuff dysfunction 07/08/2014  . Atrial fibrillation, persistent 07/08/2014  . Varicose veins of lower extremities with other complications 57/26/2035  . Venous reflux 03/27/2014  . Ulcer of lower limb ([place]) 03/27/2014  . Chronic venous insufficiency 03/27/2014  . Personal history of lymphoma 04/17/2013  . Chronic back pain 04/17/2013  . Acquired lymphedema of leg 04/17/2013  . Osteomyelitis of vertebra ([place]) 04/17/2013    Orientation RESPIRATION BLADDER Height & Weight     Self, Time, Situation, Place  O2(Nasal cannula 4L) Continent, External catheter Weight: (!) 199.7 kg Height:  6' (182.9 cm)  BEHAVIORAL SYMPTOMS/MOOD NEUROLOGICAL BOWEL NUTRITION STATUS      Continent Diet(Please see DC Summary)  AMBULATORY STATUS COMMUNICATION OF NEEDS Skin    Extensive Assist Verbally PU Stage and Appropriate Care                       Personal Care Assistance Level of Assistance  Bathing, Feeding, Dressing Bathing Assistance: Maximum assistance Feeding assistance: Limited assistance Dressing Assistance: Limited assistance     Functional Limitations Info  Sight, Hearing, Speech Sight Info: Impaired Hearing Info: Impaired Speech Info: Adequate    SPECIAL CARE FACTORS FREQUENCY  PT (By licensed PT), OT (By licensed OT)     PT Frequency: 5x/week OT Frequency: 3x/week            Contractures      Additional Factors Info  Code Status, Allergies, Isolation Precautions Code Status Info: Full Allergies Info: Vancomycin     Isolation Precautions Info: MRSA in the nose     Current Medications (12/25/2018):  This is the current hospital active medication list Current Facility-Administered Medications  Medication Dose Route Frequency Provider Last Rate Last Dose  . acetaminophen (TYLENOL) tablet 650 mg  650 mg Oral Q6H PRN Oswald Hillock, MD      . apixaban (ELIQUIS) tablet 5 mg  5 mg Oral BID Oswald Hillock, MD   5 mg at 12/25/18 0856  . Chlorhexidine Gluconate Cloth 2 % PADS 6 each  6 each Topical Q0600 Ivor Costa, MD   6 each at 12/25/18 [phone]  . cycloSPORINE (RESTASIS) 0.05 % ophthalmic emulsion 1 drop  1 drop Both Eyes BID PRN Oswald Hillock, MD      . fluticasone (FLONASE) 50 MCG/ACT nasal spray 2 spray  2 spray Each Nare QHS Oswald Hillock, MD      . furosemide (LASIX) 250 mg in dextrose 5 % 250 mL (1 mg/mL) infusion  20 mg/hr Intravenous Continuous Thurnell Lose, MD      . hydrocerin (EUCERIN) cream   Topical Daily Thurnell Lose, MD      . lactulose (CHRONULAC) 10 GM/15ML solution 10 g  10 g Oral Daily Oswald Hillock, MD   10 g at 12/25/18 0855  . levothyroxine (SYNTHROID, LEVOTHROID) tablet 125 mcg  125 mcg Oral Q0600 Ivor Costa, MD   125 mcg at 12/25/18 [phone]  . mupirocin ointment (BACTROBAN) 2 % 1 application  1  application Nasal BID Ivor Costa, MD   1 application at 39/68/86 0856  . nadolol (CORGARD) tablet 20 mg  20 mg Oral Daily Thurnell Lose, MD   20 mg at 12/24/18 1421  . ondansetron (ZOFRAN) injection 4 mg  4 mg Intravenous Q6H PRN Oswald Hillock, MD      . oxyCODONE (Oxy IR/ROXICODONE) immediate release tablet 20 mg  20 mg Oral Q8H PRN Oswald Hillock, MD   20 mg at 12/24/18 1027  . spironolactone (ALDACTONE) tablet 100 mg  100 mg Oral BID Wynetta Emery, Clanford L, MD   100 mg at 12/25/18 4847     Discharge Medications: Please see discharge summary for a list of discharge medications.  Relevant Imaging Results:  Relevant Lab Results:   Additional Information SSn: [place]  Benard Halsted, [place]

## 2018-12-25 NOTE — Consult Note (Signed)
[place] Nurse wound consult note Reason for Consult: Bilateral LEs and right posterior heel fissue Wound type: lymphedema (venous insufficiency) and heel fissure Pressure Injury POA: NA Measurement:2.2cm x 0.2cm x 0.2cm Wound BOF:BPZWC red, dry Drainage (amount, consistency, odor) none Periwound:dry cracked Dressing procedure/placement/frequency: Patient has his lymphedema sleeves with him here at the hospital and is wearing the compression undergarments. LEs are dry and there is a fissure to the right posterior heel. I have provided Nursing with guidance for the care of his LEs via the Orders to wash legs and feet daily with house incontinence cleanser and to follow with a light layer of Eucerin cream.  The fissure is to be treated with white petrolatum gauze. Patient may use his lymphedema garments as previously prescribed.  [place] nursing team will not follow, but will remain available to this patient, the nursing and medical teams.  Please re-consult if needed. Thanks, Maudie Flakes, MSN, RN, [place], Arther Abbott  Pager# [phone]

## 2018-12-25 NOTE — Progress Notes (Signed)
Orthopedic Tech Progress Note Patient Details:  Albert James Dec 04, 1955 [id number]  Ortho Devices Type of Ortho Device: Haematologist Ortho Device/Splint Interventions: Ordered, Application, Adjustment   Post Interventions Patient Tolerated: Well Instructions Provided: Care of device, Adjustment of device   Jaslene Marsteller J Paislynn Hegstrom 12/25/2018, 3:35 PM

## 2018-12-25 NOTE — Evaluation (Signed)
Physical Therapy Evaluation Patient Details Name: Albert James MRN: [id number] DOB: Aug 26, 1956 Today's Date: 12/25/2018   History of Present Illness  63yo male presenting with general weakness, history of 2 falls in the past week. Admitted for acute on chronic heart failure. PMH abdominal hernia, anxiety, LBP, A-fib, knee pain, non-Hodgkins lymphoma, PVD, shoulder RCR   Clinical Impression   Patient received in bed, pleasant and willing to participate in PT this morning. Able to complete bed mobility with MinA to bring legs over EOB, then able to push trunk up to sitting with extended time and min guard. Very unsafe with functional transfers and required assist of MinAX1/ModAx1 (total of +2) for safe sit to stand while pulling from RW despite cues from PT, very unsafe stand-pivot transfer to bedside recliner with MinAx1/Min guard x1 for safety with RW and hunched over/placing forearms on RW despite ongoing cues from PT. He was left up in recliner with all needs met, chair alarm active. He will continue to benefit from skilled PT services in the acute setting, also recommend ongoing skilled services in the ST-SNF setting moving forward due to extremely high fall risk if to return home.     Follow Up Recommendations SNF    Equipment Recommendations  Other (comment)(defer to next venue )    Recommendations for Other Services       Precautions / Restrictions Precautions Precautions: Fall;Other (comment) Precaution Comments: watch SPO2 and HR Restrictions Weight Bearing Restrictions: No      Mobility  Bed Mobility Overal bed mobility: Needs Assistance Bed Mobility: Supine to Sit     Supine to sit: Min assist     General bed mobility comments: MinA to progress LEs towards EOB, extended time and cues for safety   Transfers Overall transfer level: Needs assistance Equipment used: Rolling walker (2 wheeled) Transfers: Sit to/from [facility] Sit to Stand: Mod  assist;Min assist Stand pivot transfers: Min assist       General transfer comment: MinAx1 and ModAx1 for safety with transfer and to stabilize RW during transfer, very poor safety awareness despite cues from PT; MinA to maintain balance and for safety during transfer as patient hunches over RW/places forearms on RW   Ambulation/Gait             General Gait Details: deferred due to safety concerns, able to take pivotal steps for transfer to chair   Stairs            Wheelchair Mobility    Modified Rankin (Stroke Patients Only)       Balance Overall balance assessment: Needs assistance;History of Falls Sitting-balance support: Bilateral upper extremity supported;Feet supported Sitting balance-Leahy Scale: Good     Standing balance support: Bilateral upper extremity supported;No upper extremity supported Standing balance-Leahy Scale: Poor Standing balance comment: heavy UE support on RW, use of forearms instead of holding RW properly                              Pertinent Vitals/Pain Pain Assessment: Faces Faces Pain Scale: Hurts little more Pain Location: heel due to crack in this area  Pain Descriptors / Indicators: Aching;Sharp Pain Intervention(s): Limited activity within patient's tolerance;Monitored during session;Repositioned    Home Living Family/patient expects to be discharged to:: Private residence Living Arrangements: Children Available Help at Discharge: Family;Available PRN/intermittently Type of Home: House Home Access: Ramped entrance     Home Layout: One level Home Equipment: Gilford Rile -  2 wheels(compression wraps to both LEs )      Prior Function Level of Independence: Independent with assistive device(s)         Comments: uses RW but needs someone to stabilize RW for transfers      Hand Dominance        Extremity/Trunk Assessment   Upper Extremity Assessment Upper Extremity Assessment: Defer to OT evaluation     Lower Extremity Assessment Lower Extremity Assessment: Generalized weakness    Cervical / Trunk Assessment Cervical / Trunk Assessment: Normal  Communication   Communication: No difficulties  Cognition Arousal/Alertness: Awake/alert Behavior During Therapy: WFL for tasks assessed/performed;Flat affect Overall Cognitive Status: Within Functional Limits for tasks assessed                                        General Comments      Exercises     Assessment/Plan    PT Assessment Patient needs continued PT services  PT Problem List Decreased strength;Decreased coordination;Decreased activity tolerance;Decreased knowledge of use of DME;Decreased balance;Decreased safety awareness;Decreased mobility;Decreased knowledge of precautions       PT Treatment Interventions DME instruction;Therapeutic exercise;Gait training;Balance training;Neuromuscular re-education;Stair training;Functional mobility training;Cognitive remediation;Therapeutic activities;Patient/family education    PT Goals (Current goals can be found in the Care Plan section)  Acute Rehab PT Goals Patient Stated Goal: get stronger, go home  PT Goal Formulation: With patient Time For Goal Achievement: 01/08/19 Potential to Achieve Goals: Good    Frequency Min 2X/week   Barriers to discharge        Co-evaluation               AM-PAC PT "6 Clicks" Mobility  Outcome Measure Help needed turning from your back to your side while in a flat bed without using bedrails?: A Little Help needed moving from lying on your back to sitting on the side of a flat bed without using bedrails?: A Little Help needed moving to and from a bed to a chair (including a wheelchair)?: A Lot Help needed standing up from a chair using your arms (e.g., wheelchair or bedside chair)?: A Lot Help needed to walk in hospital room?: A Lot Help needed climbing 3-5 steps with a railing? : Total 6 Click Score: 13    End of  Session Equipment Utilized During Treatment: Oxygen Activity Tolerance: Patient tolerated treatment well Patient left: in chair;with call bell/phone within reach;with chair alarm set   PT Visit Diagnosis: Unsteadiness on feet (R26.81);Muscle weakness (generalized) (M62.81);History of falling (Z91.81);Difficulty in walking, not elsewhere classified (R26.2)    Time: 0926-1000 PT Time Calculation (min) (ACUTE ONLY): 34 min   Charges:   PT Evaluation $PT Eval Low Complexity: 1 Low PT Treatments $Therapeutic Activity: 8-22 mins       Deniece Ree PT, DPT, CBIS  Supplemental Physical Therapist [place]    Pager [phone] Acute Rehab Office [phone]

## 2018-12-25 NOTE — Progress Notes (Signed)
PROGRESS NOTE                                                                                                                                                                                                             Patient Demographics:    Albert James, is a 63 y.o. male, DOB - January 13, 1956, BSJ:[id number]  Admit date - 12/22/2018   Admitting Physician Oswald Hillock, MD  Outpatient Primary MD for the patient is Mayra Neer, MD  LOS - 2  Chief Complaint  Patient presents with  . Weakness       Brief Narrative  Albert James  is a 63 y.o. male, with h/o chronic diastolic CHF, atrial fibrillation, non hodgkin's lymphoma , liver cirrhosis from NASH, peripheral vascular disease, chronic back pain came to hospital with complaint of generalized weakness, his work-up suggested massive fluid overload and generalized weakness.  He was transferred from [facility] to [facility] for further care.   Subjective:   Patient in bed, appears comfortable, denies any headache, no fever, no chest pain or pressure, no shortness of breath , no abdominal pain. No focal weakness.    Assessment  & Plan :     1.  Acute on chronic right-sided heart failure along with acute on chronic diastolic CHF.  EF preserved with severe pulmonary hypertension and right-sided heart failure on echocardiogram in December 2019 - still has massive edema has been placed on IV Lasix on 12/24/2018 at 10 mg an hour + PO Aldactone and 1 dose of PO Zaroxolyn with excellent diuresis of close to 5 L.  Continue to monitor electrolytes and chloride, continue salt and fluid restriction, Unna boots applied to the legs.  Cardiology to evaluate today.  2.  Chronic atrial fibrillation.  Mali vas 2 score of 3.  Not on any rate controlling agents, continue Eliquis for stroke prophylaxis.  3.  Hypothyroidism.  On home dose Synthroid.  4.  NASH, severe pulmonary hypertension, morbid obesity.  Supportive care.  Continue  nadolol and lactulose.  5.  Generalized weakness.  PT eval.  6.  Hyponatremia.  Due to massive fluid overload.  Diurese and monitor.    Family Communication  :  Family bedside  Code Status :  Full  Disposition Plan  :  TBD  Consults  :  Cards  Procedures  :    TTE 10/2018 - - Normal LV systolic function; moderate LVH; D shaped septum;  mildly dilated aortic root; severe RVE with severe  RV dysfunction; severe RAE; severe TR; severe pulmonary hypertension.   DVT Prophylaxis  :  Eliquis  Lab Results  Component Value Date   PLT 143 (L) 12/24/2018    Diet :  Diet Order            Diet Heart Room service appropriate? Yes; Fluid consistency: Thin; Fluid restriction: 1200 mL Fluid  Diet effective now               Inpatient Medications Scheduled Meds: . apixaban  5 mg Oral BID  . Chlorhexidine Gluconate Cloth  6 each Topical Q0600  . fluticasone  2 spray Each Nare QHS  . hydrocerin   Topical Daily  . lactulose  10 g Oral Daily  . levothyroxine  125 mcg Oral Q0600  . mupirocin ointment  1 application Nasal BID  . nadolol  20 mg Oral Daily  . spironolactone  100 mg Oral BID   Continuous Infusions: . furosemide (LASIX) infusion     PRN Meds:.acetaminophen **OR** [DISCONTINUED] acetaminophen, cycloSPORINE, [DISCONTINUED] ondansetron **OR** ondansetron (ZOFRAN) IV, oxyCODONE  Antibiotics  :   Anti-infectives (From admission, onward)   None        Objective:   Vitals:   12/25/18 0420 12/25/18 0524 12/25/18 0900 12/25/18 0904  BP: 111/81  98/75 102/79  Pulse: 72  69 64  Resp: 19  17 (!) 25  Temp:      TempSrc:      SpO2: 96%  96% 98%  Weight:  (!) 199.7 kg    Height:        Wt Readings from Last 3 Encounters:  12/25/18 (!) 199.7 kg  11/02/18 (!) 190.4 kg  10/17/18 (!) 194.1 kg     Intake/Output Summary (Last 24 hours) at 12/25/2018 1027 Last data filed at 12/25/2018 9935 Gross per 24 hour  Intake 163.72 ml  Output 5025 ml  Net -4861.28 ml      Physical Exam  Awake Alert, Oriented X 3, No new F.N deficits, Normal affect [place].AT,PERRAL Supple Neck,No JVD, No cervical lymphadenopathy appriciated.  Symmetrical Chest wall movement, Good air movement bilaterally, CTAB RRR,No Gallops, Rubs or new Murmurs, No Parasternal Heave +ve B.Sounds, Abd Soft, No tenderness, No organomegaly appriciated, No rebound - guarding or rigidity. No Cyanosis, Clubbing, 4+ edema, No new Rash or bruise     Data Review:    CBC Recent Labs  Lab 12/23/18 0004 12/24/18 0814  WBC 10.9* 6.9  HGB 14.8 13.7  HCT 48.1 44.3  PLT 121* 143*  MCV 94.3 93.5  MCH 29.0 28.9  MCHC 30.8 30.9  RDW 23.1* 23.1*  LYMPHSABS 1.2  --   MONOABS 1.0  --   EOSABS 0.0  --   BASOSABS 0.0  --     Chemistries  Recent Labs  Lab 12/23/18 0004 12/24/18 1047 12/25/18 0435  NA 131* 132* 131*  K 4.2 4.1 3.8  CL 96* 98 98  CO2 22 23 23   GLUCOSE 107* 182* 119*  BUN 51* 47* 46*  CREATININE 2.21* 1.87* 1.72*  CALCIUM 8.9 8.9 8.5*  MG  --  2.7* 2.4  AST 55*  --   --   ALT 36  --   --   ALKPHOS 79  --   --   BILITOT 2.6*  --   --    ------------------------------------------------------------------------------------------------------------------ No results for input(s): CHOL, HDL, LDLCALC, TRIG, CHOLHDL, LDLDIRECT in the last 72 hours.  No results found for: HGBA1C ------------------------------------------------------------------------------------------------------------------ No results for input(s): TSH,  T4TOTAL, T3FREE, THYROIDAB in the last 72 hours.  Invalid input(s): FREET3 ------------------------------------------------------------------------------------------------------------------ No results for input(s): VITAMINB12, FOLATE, FERRITIN, TIBC, IRON, RETICCTPCT in the last 72 hours.  Coagulation profile No results for input(s): INR, PROTIME in the last 168 hours.  No results for input(s): DDIMER in the last 72 hours.  Cardiac Enzymes Recent  Labs  Lab 12/23/18 0004  TROPONINI <0.03   ------------------------------------------------------------------------------------------------------------------    Component Value Date/Time   BNP 1,221.0 (H) 12/23/2018 0004    Micro Results Recent Results (from the past 240 hour(s))  MRSA PCR Screening     Status: Abnormal   Collection Time: 12/23/18  6:57 PM  Result Value Ref Range Status   MRSA by PCR POSITIVE (A) NEGATIVE Final    Comment:        The GeneXpert MRSA Assay (FDA approved for NASAL specimens only), is one component of a comprehensive MRSA colonization surveillance program. It is not intended to diagnose MRSA infection nor to guide or monitor treatment for MRSA infections. RESULT CALLED TO, READ BACK BY AND VERIFIED WITH: OVERBY,M RN [phone] 12/23/2018 MITCHELL,L     Radiology Reports Dg Chest Port 1 View  Result Date: 12/23/2018 CLINICAL DATA:  Weakness EXAM: PORTABLE CHEST 1 VIEW COMPARISON:  11/25/2017 FINDINGS: Cardiomegaly with vascular congestion. Interstitial prominence likely reflects early interstitial edema. No effusions or acute bony abnormality. IMPRESSION: Cardiomegaly with vascular congestion and mild interstitial edema. Electronically Signed   By: Rolm Baptise M.D.   On: 12/23/2018 00:36    Time Spent in minutes  30   Lala Lund M.D on 12/25/2018 at 10:27 AM  To page go to [email or web address] - password [facility]

## 2018-12-26 ENCOUNTER — Other Ambulatory Visit (HOSPITAL_COMMUNITY): Payer: Medicare Other

## 2018-12-26 DIAGNOSIS — I482 Chronic atrial fibrillation, unspecified: Secondary | ICD-10-CM

## 2018-12-26 LAB — BASIC METABOLIC PANEL
Anion gap: 14 (ref 5–15)
BUN: 45 mg/dL — ABNORMAL HIGH (ref 8–23)
CALCIUM: 9 mg/dL (ref 8.9–10.3)
CO2: 26 mmol/L (ref 22–32)
Chloride: 94 mmol/L — ABNORMAL LOW (ref 98–111)
Creatinine, Ser: 1.61 mg/dL — ABNORMAL HIGH (ref 0.61–1.24)
GFR calc Af Amer: 52 mL/min — ABNORMAL LOW (ref >60)
GFR, EST NON AFRICAN AMERICAN: 45 mL/min — AB (ref >60)
Glucose, Bld: 121 mg/dL — ABNORMAL HIGH (ref 70–99)
Potassium: 3.6 mmol/L (ref 3.5–5.1)
Sodium: 134 mmol/L — ABNORMAL LOW (ref 135–145)

## 2018-12-26 MED ORDER — FUROSEMIDE 10 MG/ML IJ SOLN
20.0000 mg/h | INTRAVENOUS | Status: AC
  Administered 2018-12-26 – 2018-12-27 (×3): 20 mg/h via INTRAVENOUS
  Filled 2018-12-26: qty 25
  Filled 2018-12-26: qty 2

## 2018-12-26 MED ORDER — ACETAZOLAMIDE ER 500 MG PO CP12
500.0000 mg | ORAL_CAPSULE | Freq: Two times a day (BID) | ORAL | Status: AC
  Administered 2018-12-26 – 2018-12-27 (×3): 500 mg via ORAL
  Filled 2018-12-26 (×3): qty 1

## 2018-12-26 MED ORDER — POTASSIUM CHLORIDE CRYS ER 20 MEQ PO TBCR
40.0000 meq | EXTENDED_RELEASE_TABLET | Freq: Once | ORAL | Status: AC
  Administered 2018-12-26: 40 meq via ORAL
  Filled 2018-12-26: qty 2

## 2018-12-26 NOTE — Progress Notes (Signed)
PROGRESS NOTE                                                                                                                                                                                                             Patient Demographics:    Albert James, is a 63 y.o. male, DOB - 1956-05-01, GTX:[id number]  Admit date - 12/22/2018   Admitting Physician Oswald Hillock, MD  Outpatient Primary MD for the patient is Mayra Neer, MD  LOS - 3  Chief Complaint  Patient presents with  . Weakness       Brief Narrative  Albert James  is a 63 y.o. male, with h/o chronic diastolic CHF, atrial fibrillation, non hodgkin's lymphoma , liver cirrhosis from NASH, peripheral vascular disease, chronic back pain came to hospital with complaint of generalized weakness, his work-up suggested massive fluid overload and generalized weakness.  He was transferred from [facility] to [facility] for further care.   Subjective:   Patient in bed, appears comfortable, denies any headache, no fever, no chest pain or pressure, no shortness of breath , no abdominal pain. No focal weakness.     Assessment  & Plan :     1.  Acute on chronic right-sided heart failure along with acute on chronic diastolic CHF.  EF preserved with severe pulmonary hypertension and right-sided heart failure on echocardiogram in December 2019 -has been aggressively diuresed with IV Lasix drip along with Aldactone, received 1 dose of oral Zaroxolyn on 12/24/2018.  So far has diuresed about 15 L in 3 days, chloride has started to drop hence I have added Diamox on 12/26/2018 for 3 days.  Kindly monitor electrolytes closely along with chloride levels.  Cardiology on board.   2.  Chronic atrial fibrillation.  Mali vas 2 score of 3.  Not on any rate controlling agents, continue Eliquis for stroke prophylaxis.  3.  Hypothyroidism.  On home dose Synthroid.  4.  NASH, severe pulmonary hypertension, morbid obesity.   Supportive care.  Continue nadolol and lactulose.  5.  Generalized weakness.  PT eval. will require SNF.  6.  Hyponatremia.  Due to massive fluid overload improving with diuresis.    Family Communication  :  Family bedside  Code Status :  Full  Disposition Plan  :  TBD  Consults  :  Cards  Procedures  :    TTE 10/2018 - - Normal LV systolic function; moderate LVH; D shaped septum;  mildly dilated aortic root;  severe RVE with severe RV dysfunction; severe RAE; severe TR; severe pulmonary hypertension.   DVT Prophylaxis  :  Eliquis  Lab Results  Component Value Date   PLT 143 (L) 12/24/2018    Diet :  Diet Order            Diet Heart Room service appropriate? Yes; Fluid consistency: Thin; Fluid restriction: 1200 mL Fluid  Diet effective now               Inpatient Medications Scheduled Meds: . acetaZOLAMIDE  500 mg Oral Q12H  . apixaban  5 mg Oral BID  . Chlorhexidine Gluconate Cloth  6 each Topical Q0600  . fluticasone  2 spray Each Nare QHS  . hydrocerin   Topical Daily  . lactulose  10 g Oral Daily  . levothyroxine  125 mcg Oral Q0600  . mupirocin ointment  1 application Nasal BID  . nadolol  20 mg Oral Daily  . spironolactone  100 mg Oral BID   Continuous Infusions: . furosemide (LASIX) infusion 20 mg/hr (12/26/18 0926)   PRN Meds:.acetaminophen **OR** [DISCONTINUED] acetaminophen, cycloSPORINE, [DISCONTINUED] ondansetron **OR** ondansetron (ZOFRAN) IV, oxyCODONE  Antibiotics  :   Anti-infectives (From admission, onward)   None        Objective:   Vitals:   12/25/18 0524 12/25/18 0900 12/25/18 0904 12/25/18 2015  BP:  98/75 102/79   Pulse:  69 64   Resp:  17 (!) 25   Temp:    98.4 F (36.9 C)  TempSrc:    Oral  SpO2:  96% 98%   Weight: (!) 199.7 kg     Height:        Wt Readings from Last 3 Encounters:  12/25/18 (!) 199.7 kg  11/02/18 (!) 190.4 kg  10/17/18 (!) 194.1 kg     Intake/Output Summary (Last 24 hours) at 12/26/2018  1014 Last data filed at 12/26/2018 0607 Gross per 24 hour  Intake 1350 ml  Output 10550 ml  Net -9200 ml     Physical Exam  Awake Alert, Oriented X 3, No new F.N deficits, Normal affect [place].AT,PERRAL Supple Neck,No JVD, No cervical lymphadenopathy appriciated.  Symmetrical Chest wall movement, Good air movement bilaterally, CTAB RRR,No Gallops, Rubs or new Murmurs, No Parasternal Heave +ve B.Sounds, Abd Soft, No tenderness, No organomegaly appriciated, No rebound - guarding or rigidity. No Cyanosis, Clubbing , No new Rash or bruise, still has 4+ lower extremity edema with Unna boots in place   Data Review:    CBC Recent Labs  Lab 12/23/18 0004 12/24/18 0814  WBC 10.9* 6.9  HGB 14.8 13.7  HCT 48.1 44.3  PLT 121* 143*  MCV 94.3 93.5  MCH 29.0 28.9  MCHC 30.8 30.9  RDW 23.1* 23.1*  LYMPHSABS 1.2  --   MONOABS 1.0  --   EOSABS 0.0  --   BASOSABS 0.0  --     Chemistries  Recent Labs  Lab 12/23/18 0004 12/24/18 1047 12/25/18 0435 12/26/18 0349  NA 131* 132* 131* 134*  K 4.2 4.1 3.8 3.6  CL 96* 98 98 94*  CO2 22 23 23 26   GLUCOSE 107* 182* 119* 121*  BUN 51* 47* 46* 45*  CREATININE 2.21* 1.87* 1.72* 1.61*  CALCIUM 8.9 8.9 8.5* 9.0  MG  --  2.7* 2.4  --   AST 55*  --   --   --   ALT 36  --   --   --   ALKPHOS 79  --   --   --  BILITOT 2.6*  --   --   --    ------------------------------------------------------------------------------------------------------------------ No results for input(s): CHOL, HDL, LDLCALC, TRIG, CHOLHDL, LDLDIRECT in the last 72 hours.  No results found for: HGBA1C ------------------------------------------------------------------------------------------------------------------ No results for input(s): TSH, T4TOTAL, T3FREE, THYROIDAB in the last 72 hours.  Invalid input(s): FREET3 ------------------------------------------------------------------------------------------------------------------ No results for input(s): VITAMINB12,  FOLATE, FERRITIN, TIBC, IRON, RETICCTPCT in the last 72 hours.  Coagulation profile No results for input(s): INR, PROTIME in the last 168 hours.  No results for input(s): DDIMER in the last 72 hours.  Cardiac Enzymes Recent Labs  Lab 12/23/18 0004  TROPONINI <0.03   ------------------------------------------------------------------------------------------------------------------    Component Value Date/Time   BNP 1,221.0 (H) 12/23/2018 0004    Micro Results Recent Results (from the past 240 hour(s))  MRSA PCR Screening     Status: Abnormal   Collection Time: 12/23/18  6:57 PM  Result Value Ref Range Status   MRSA by PCR POSITIVE (A) NEGATIVE Final    Comment:        The GeneXpert MRSA Assay (FDA approved for NASAL specimens only), is one component of a comprehensive MRSA colonization surveillance program. It is not intended to diagnose MRSA infection nor to guide or monitor treatment for MRSA infections. RESULT CALLED TO, READ BACK BY AND VERIFIED WITH: OVERBY,M RN [phone] 12/23/2018 MITCHELL,L     Radiology Reports Dg Chest Port 1 View  Result Date: 12/23/2018 CLINICAL DATA:  Weakness EXAM: PORTABLE CHEST 1 VIEW COMPARISON:  11/25/2017 FINDINGS: Cardiomegaly with vascular congestion. Interstitial prominence likely reflects early interstitial edema. No effusions or acute bony abnormality. IMPRESSION: Cardiomegaly with vascular congestion and mild interstitial edema. Electronically Signed   By: Rolm Baptise M.D.   On: 12/23/2018 00:36    Time Spent in minutes  30   Lala Lund M.D on 12/26/2018 at 10:14 AM  To page go to [email or web address] - password [facility]

## 2018-12-26 NOTE — Clinical Social Work Note (Signed)
Clinical Social Work Assessment  Patient Details  Name: Albert James MRN: [id number] Date of Birth: 1956-04-01  Date of referral:  12/26/18               Reason for consult:  Facility Placement                Permission sought to share information with:  Facility Sport and exercise psychologist, Family Supports Permission granted to share information::  Yes, Verbal Permission Granted  Name::     Hydrologist::  SNFs  Relationship::  Daughter  Contact Information:  [phone]  Housing/Transportation Living arrangements for the past 2 months:  [place] of Information:  Patient Patient Interpreter Needed:  None Criminal Activity/Legal Involvement Pertinent to Current Situation/Hospitalization:  No - Comment as needed Significant Relationships:  Adult Children Lives with:  Adult Children Do you feel safe going back to the place where you live?  Yes Need for family participation in patient care:  No (Coment)  Care giving concerns:  CSW received consult for possible SNF placement at time of discharge. CSW spoke with patient regarding PT recommendation of SNF placement at time of discharge. Patient reported that he has not considered his discharge plan yet and requests CSW contact his daughter, who lives with him. CSW to continue to follow and assist with discharge planning needs.   Social Worker assessment / plan:  CSW spoke with patient concerning possibility of rehab at [facility] before returning home.  Employment status:  Retired Nurse, adult PT Recommendations:  [place] / Referral to community resources:  [place]  Patient/Family's Response to care: Patient recognizes need for rehab before returning home and may be agreeable to a SNF in [place]. CSW will follow up with his daughter. CSW explained insurance authorization process.   Patient/Family's Understanding of and Emotional Response  to Diagnosis, Current Treatment, and Prognosis:  Patient/family is realistic regarding therapy needs and expressed being hopeful for feeling better. He stated he was able to walk from the chair to the bed but that therapy has not done much else with him. Patient expressed understanding of CSW role and discharge process as well as medical condition. No questions/concerns about plan or treatment.    Emotional Assessment Appearance:  Appears stated age Attitude/Demeanor/Rapport:  Guarded Affect (typically observed):  Appropriate, Accepting Orientation:  Oriented to Self, Oriented to Place, Oriented to  Time, Oriented to Situation Alcohol / Substance use:  Not Applicable Psych involvement (Current and /or in the community):  No (Comment)  Discharge Needs  Concerns to be addressed:  Care Coordination Readmission within the last 30 days:  No Current discharge risk:  Dependent with Mobility Barriers to Discharge:  Continued Medical Work up   [employer], LCSW 12/26/2018, 3:35 PM

## 2018-12-26 NOTE — Progress Notes (Addendum)
Advanced Heart Failure Rounding Note  PCP-Cardiologist: No primary care provider on file.   Subjective:    Yesterday he was switched to lasix drip 20 mg per hour .  Negative 9.2 liters. Creatinine ok.     Remains SOB with exertion. Weak getting out of bed.    Objective:   Weight Range: (!) 199.7 kg Body mass index is 59.71 kg/m.   Vital Signs:   Temp:  [98.4 F (36.9 C)] 98.4 F (36.9 C) (02/10 2015) Pulse Rate:  [64-69] 64 (02/10 0904) Resp:  [17-25] 25 (02/10 0904) BP: (98-102)/(75-79) 102/79 (02/10 0904) SpO2:  [96 %-98 %] 98 % (02/10 0904) Last BM Date: 12/21/18  Weight change: Filed Weights   12/22/18 2240 12/25/18 0524  Weight: (!) 196.4 kg (!) 199.7 kg    Intake/Output:   Intake/Output Summary (Last 24 hours) at 12/26/2018 0811 Last data filed at 12/26/2018 0607 Gross per 24 hour  Intake 1350 ml  Output 10550 ml  Net -9200 ml      Physical Exam    General:  Appears chronically ill.  No resp difficulty HEENT: Normal Neck: Supple. JVP to jaw . Carotids 2+ bilat; no bruits. No lymphadenopathy or thyromegaly appreciated. Cor: PMI nondisplaced. Irregular rate & rhythm. No rubs, gallops or murmurs. Lungs: Clear Abdomen: Soft, nontender, nondistended. No hepatosplenomegaly. No bruits or masses. Good bowel sounds. Extremities: No cyanosis, clubbing, rash, R and LLE 3+ edema compression wraps.  Neuro: Alert & orientedx3, cranial nerves grossly intact. moves all 4 extremities w/o difficulty. Affect pleasant   Telemetry   A fib 60-70s   EKG    n/a  Labs    CBC Recent Labs    12/24/18 0814  WBC 6.9  HGB 13.7  HCT 44.3  MCV 93.5  PLT 161*   Basic Metabolic Panel Recent Labs    12/24/18 1047 12/25/18 0435 12/26/18 0349  NA 132* 131* 134*  K 4.1 3.8 3.6  CL 98 98 94*  CO2 23 23 26   GLUCOSE 182* 119* 121*  BUN 47* 46* 45*  CREATININE 1.87* 1.72* 1.61*  CALCIUM 8.9 8.5* 9.0  MG 2.7* 2.4  --    Liver Function Tests No results for  input(s): AST, ALT, ALKPHOS, BILITOT, PROT, ALBUMIN in the last 72 hours. No results for input(s): LIPASE, AMYLASE in the last 72 hours. Cardiac Enzymes No results for input(s): CKTOTAL, CKMB, CKMBINDEX, TROPONINI in the last 72 hours.  BNP: BNP (last 3 results) Recent Labs    12/23/18 0004  BNP 1,221.0*    ProBNP (last 3 results) No results for input(s): PROBNP in the last 8760 hours.   D-Dimer No results for input(s): DDIMER in the last 72 hours. Hemoglobin A1C No results for input(s): HGBA1C in the last 72 hours. Fasting Lipid Panel No results for input(s): CHOL, HDL, LDLCALC, TRIG, CHOLHDL, LDLDIRECT in the last 72 hours. Thyroid Function Tests No results for input(s): TSH, T4TOTAL, T3FREE, THYROIDAB in the last 72 hours.  Invalid input(s): FREET3  Other results:   Imaging     No results found.   Medications:     Scheduled Medications: . acetaZOLAMIDE  500 mg Oral Q12H  . apixaban  5 mg Oral BID  . Chlorhexidine Gluconate Cloth  6 each Topical Q0600  . fluticasone  2 spray Each Nare QHS  . hydrocerin   Topical Daily  . lactulose  10 g Oral Daily  . levothyroxine  125 mcg Oral Q0600  . mupirocin ointment  1  application Nasal BID  . nadolol  20 mg Oral Daily  . spironolactone  100 mg Oral BID     Infusions:   PRN Medications:  acetaminophen **OR** [DISCONTINUED] acetaminophen, cycloSPORINE, [DISCONTINUED] ondansetron **OR** ondansetron (ZOFRAN) IV, oxyCODONE    Patient Profile  Albert James is a 63 year old with a history of permanent atrial fibrillation, NASH with cirrhosis, suspected OHS/OSA, and chronic diastolic CHF/RV failure.   Admitted with volume overload.   Assessment/Plan   1. Acute/Chronic Cor Pulmonale  ECHO 10/2018 EF 60-65% Severe TR, D shaped septum, Peak PA pressure 89, severely dilated RV with severe systolic dysfunction.  Marked volume overload in the setting high sodium diet.  Brisk diuresis noted. Continue lasix drip 20  mg per hour.  Supplement K.  Continue spiro at current dose.  Follow renal function.  Discussed limiting fluid intake to < 2 liters per day and low salt diet. Strict I and Os.   2. Pulmonary HTN PA pressures elevated on ECHO. Suspect WHO Group 3 due to COPD and OSA.  Continue oxygen.   3. OHS/OSA Continue home oxygen and CPAP. On 2 liters chronically.   4. Cirrhosis Suspected NASH versus congestive hepatopathy from RV failure. Denies ETOH. Cannot totally rule out component of portopulmonary hypertension. He is on spironolactone at high dose  5. Chronic A fib Rate controlled.  Continue eliquis 5 mg twice a day.   6. CKD Stage III Creatinine baselin ~1.5.   7. Obesity  Body mass index is 59.71 kg/m.   Length of Stay: 3  Amy Clegg, NP  12/26/2018, 8:11 AM  Advanced Heart Failure Team Pager [phone] (M-F; 7a - 4p)  Please contact [facility] for night-coverage after hours (4p -7a ) and weekends on [email or web address]  Patient seen with NP, agree with the above note.    He diuresed very well yesterday.  Weight down.   On exam, JVP difficult but appears elevated. 2+ edema into his thighs.  Irregular S1S2.   Severe RV failure likely due to OHS/OSA and severe COPD.  He remains markedly volume overloaded.  - Continue Lasix gtt + acetazolamide + spironolactone.    Creatinine lower today at 1.61, follow closely.   Chronic atrial fibrillation, he is rate-controlled and on apixaban.   Very poor long-term prognosis in absence of significant weight loss.   [name] 12/26/2018 5:02 PM

## 2018-12-26 NOTE — Progress Notes (Signed)
CSW spoke with patient's daughter. She requested CSW contact patient's son. CSW spoke with patient's son in [place]. He reports that he is open to SNF as long as they do not try to keep patient for long term. CSW explained insurance process and purpose of short term rehab. CSW emailed him available SNF options.   Albert Locus Thomson Herbers LCSW [phone]

## 2018-12-27 ENCOUNTER — Encounter (HOSPITAL_COMMUNITY): Payer: Self-pay | Admitting: General Practice

## 2018-12-27 ENCOUNTER — Other Ambulatory Visit (HOSPITAL_COMMUNITY): Payer: Medicare Other

## 2018-12-27 DIAGNOSIS — Z6841 Body Mass Index (BMI) 40.0 and over, adult: Secondary | ICD-10-CM

## 2018-12-27 DIAGNOSIS — N183 Chronic kidney disease, stage 3 (moderate): Secondary | ICD-10-CM

## 2018-12-27 LAB — BASIC METABOLIC PANEL
Anion gap: 15 (ref 5–15)
BUN: 44 mg/dL — ABNORMAL HIGH (ref 8–23)
CHLORIDE: 91 mmol/L — AB (ref 98–111)
CO2: 29 mmol/L (ref 22–32)
Calcium: 8.9 mg/dL (ref 8.9–10.3)
Creatinine, Ser: 1.7 mg/dL — ABNORMAL HIGH (ref 0.61–1.24)
GFR calc Af Amer: 48 mL/min — ABNORMAL LOW (ref >60)
GFR calc non Af Amer: 42 mL/min — ABNORMAL LOW (ref >60)
Glucose, Bld: 97 mg/dL (ref 70–99)
Potassium: 3.6 mmol/L (ref 3.5–5.1)
SODIUM: 135 mmol/L (ref 135–145)

## 2018-12-27 LAB — MAGNESIUM: Magnesium: 2.2 mg/dL (ref 1.7–2.4)

## 2018-12-27 MED ORDER — FUROSEMIDE 10 MG/ML IJ SOLN
20.0000 mg/h | INTRAVENOUS | Status: DC
  Administered 2018-12-27 – 2018-12-28 (×3): 20 mg/h via INTRAVENOUS
  Filled 2018-12-27 (×2): qty 25
  Filled 2018-12-27: qty 4
  Filled 2018-12-27: qty 25
  Filled 2018-12-27: qty 20
  Filled 2018-12-27: qty 25

## 2018-12-27 MED ORDER — POTASSIUM CHLORIDE CRYS ER 20 MEQ PO TBCR
40.0000 meq | EXTENDED_RELEASE_TABLET | Freq: Once | ORAL | Status: AC
  Administered 2018-12-27: 40 meq via ORAL
  Filled 2018-12-27: qty 2

## 2018-12-27 NOTE — Progress Notes (Signed)
Patient is alert and oriented, continues on lasix drip at 61m/hr. No sign of distress noted

## 2018-12-27 NOTE — Progress Notes (Signed)
Patient ID: Albert James, male   DOB: [birth year], 63 y.o.   MRN: [id number]  PROGRESS NOTE    Albert James  QHU:[id number] DOB: 1955/12/06 DOA: 12/22/2018 PCP: Mayra Neer, MD   Brief Narrative:  63 year old male with history of chronic diastolic CHF, atrial fibrillation, non-Hodgkin's lymphoma, liver cirrhosis from NASH, peripheral vascular disease, chronic back pain presented with generalized weakness and he was found to have massive fluid overload.  [facility] was [facility] from [facility] to [facility] for further care.  Cardiology was consulted.  He was started on Lasix drip.   Assessment & Plan:   Principal Problem:   Acute cor pulmonale (HCC) Active Problems:   Chronic back pain   Acquired lymphedema of leg   CHF exacerbation (HCC)   Pressure injury of skin   Acute on chronic diastolic heart failure/severe pulmonary hypertension -Echo in December 2019 had shown preserved EF with severe pulmonary hypertension and right-sided heart failure -Heart failure team following.  Currently remains on Lasix drip along with oral spironolactone.  Monitor creatinine.  Negative balance of 20,424.2 cc since admission.  Strict input and output.  Daily weights.  Fluid restriction. -Overall prognosis is guarded to poor.  Will get palliative care evaluation  Chronic atrial fibrillation -Rate controlled.  Continue Eliquis.  Also on nadolol  Hypothyroidism -Continue Synthroid  NASH/cirrhosis of liver -Continue nadolol and lactulose with spironolactone and Lasix.  Outpatient follow-up with GI  Morbid obesity -Outpatient follow-up  Generalized weakness -PT recommends SNF.  Social worker following  Hyponatremia -Improving.  Due to massive fluid overload  Chronic kidney disease stage III -Monitor creatinine.  DVT prophylaxis: Eliquis Code Status: Full Family Communication: None at bedside Disposition Plan: Depends on clinical outcome  Consultants: Heart failure  team  Procedures: None  Antimicrobials: None   Subjective: Patient seen and examined at bedside.  He denies worsening shortness of breath, chest pain or fever.  Feels weak and tired.  Objective: Vitals:   12/27/18 0717 12/27/18 0817 12/27/18 0917 12/27/18 1017  BP: 103/76 (!) 86/64 96/68 (!) 84/63  Pulse: 67 (!) 58 72 63  Resp: 20 (!) 21 17 16   Temp:      TempSrc:      SpO2: 94% 95% 93% 95%  Weight:      Height:        Intake/Output Summary (Last 24 hours) at 12/27/2018 1404 Last data filed at 12/27/2018 1137 Gross per 24 hour  Intake 919.56 ml  Output 5000 ml  Net -4080.44 ml   Filed Weights   12/22/18 2240 12/25/18 0524 12/27/18 0700  Weight: (!) 196.4 kg (!) 199.7 kg (!) 186.5 kg    Examination:  General exam: Appears calm and comfortable.  No distress.  Looks older than stated age Respiratory system: Bilateral decreased breath sounds at bases, basilar crackles Cardiovascular system: S1 & S2 heard, Rate controlled Gastrointestinal system: Abdomen is morbidly obese, nondistended, soft and nontender. Normal bowel sounds heard. Extremities: No cyanosis, clubbing; bilateral lower extremity 3+ edema with compression wraps  Data Reviewed: I have personally reviewed following labs and imaging studies  CBC: Recent Labs  Lab 12/23/18 0004 12/24/18 0814  WBC 10.9* 6.9  NEUTROABS 8.6*  --   HGB 14.8 13.7  HCT 48.1 44.3  MCV 94.3 93.5  PLT 121* 465*   Basic Metabolic Panel: Recent Labs  Lab 12/23/18 0004 12/24/18 1047 12/25/18 0435 12/26/18 0349 12/27/18 0333  NA 131* 132* 131* 134* 135  K 4.2 4.1 3.8 3.6 3.6  CL 96* 98 98 94* 91*  CO2 22 23 23 26 29   GLUCOSE 107* 182* 119* 121* 97  BUN 51* 47* 46* 45* 44*  CREATININE 2.21* 1.87* 1.72* 1.61* 1.70*  CALCIUM 8.9 8.9 8.5* 9.0 8.9  MG  --  2.7* 2.4  --  2.2   GFR: Estimated Creatinine Clearance: 75.3 mL/min (A) (by C-G formula based on SCr of 1.7 mg/dL (H)). Liver Function Tests: Recent Labs  Lab  12/23/18 0004  AST 55*  ALT 36  ALKPHOS 79  BILITOT 2.6*  PROT 6.0*  ALBUMIN 3.6   No results for input(s): LIPASE, AMYLASE in the last 168 hours. No results for input(s): AMMONIA in the last 168 hours. Coagulation Profile: No results for input(s): INR, PROTIME in the last 168 hours. Cardiac Enzymes: Recent Labs  Lab 12/23/18 0004  TROPONINI <0.03   BNP (last 3 results) No results for input(s): PROBNP in the last 8760 hours. HbA1C: No results for input(s): HGBA1C in the last 72 hours. CBG: Recent Labs  Lab 12/24/18 1641 12/24/18 2240  GLUCAP 99 121*   Lipid Profile: No results for input(s): CHOL, HDL, LDLCALC, TRIG, CHOLHDL, LDLDIRECT in the last 72 hours. Thyroid Function Tests: No results for input(s): TSH, T4TOTAL, FREET4, T3FREE, THYROIDAB in the last 72 hours. Anemia Panel: No results for input(s): VITAMINB12, FOLATE, FERRITIN, TIBC, IRON, RETICCTPCT in the last 72 hours. Sepsis Labs: Recent Labs  Lab 12/22/18 2359 12/23/18 0328  LATICACIDVEN 2.1* 2.1*    Recent Results (from the past 240 hour(s))  MRSA PCR Screening     Status: Abnormal   Collection Time: 12/23/18  6:57 PM  Result Value Ref Range Status   MRSA by PCR POSITIVE (A) NEGATIVE Final    Comment:        The GeneXpert MRSA Assay (FDA approved for NASAL specimens only), is one component of a comprehensive MRSA colonization surveillance program. It is not intended to diagnose MRSA infection nor to guide or monitor treatment for MRSA infections. RESULT CALLED TO, READ BACK BY AND VERIFIED WITH: OVERBY,M RN 2324 12/23/2018 MITCHELL,L          Radiology Studies: No results found.      Scheduled Meds: . apixaban  5 mg Oral BID  . Chlorhexidine Gluconate Cloth  6 each Topical Q0600  . fluticasone  2 spray Each Nare QHS  . hydrocerin   Topical Daily  . lactulose  10 g Oral Daily  . levothyroxine  125 mcg Oral Q0600  . mupirocin ointment  1 application Nasal BID  . nadolol  20 mg  Oral Daily  . spironolactone  100 mg Oral BID   Continuous Infusions: . furosemide (LASIX) infusion       LOS: 4 days        Aline August, MD [facility] Pager [phone]  If 7PM-7AM, please contact night-coverage [email or web address] Password [facility] 12/27/2018, 2:04 PM

## 2018-12-27 NOTE — Progress Notes (Signed)
CSW spoke with patient's son regarding SNF choice. He reports he is still researching the facilities and will let CSW know tomorrow.   Percell Locus Timofey Carandang LCSW [phone]

## 2018-12-27 NOTE — Progress Notes (Addendum)
Advanced Heart Failure Rounding Note  PCP-Cardiologist: No primary care provider on file.   Subjective:    Remains on lasix drip @ 20 mg/hr. I/O negative 3.6 L yesterday. Weight down ?29 lbs in 2 days (bed weight). SBP 80-90s. Creatinine trending back up 1.61 -> 1.7  No CP, SOB, or dizziness. Feels weak. He is unsure of his dry weight.   Objective:   Weight Range: (!) 186.5 kg Body mass index is 55.76 kg/m.   Vital Signs:   Temp:  [97.5 F (36.4 C)-97.9 F (36.6 C)] 97.5 F (36.4 C) (02/12 0417) Pulse Rate:  [32-123] 58 (02/12 0817) Resp:  [15-26] 21 (02/12 0817) BP: (86-111)/(59-82) 86/64 (02/12 0817) SpO2:  [90 %-97 %] 95 % (02/12 0817) Weight:  [186.5 kg] 186.5 kg (02/12 0700) Last BM Date: 12/26/18  Weight change: Filed Weights   12/22/18 2240 12/25/18 0524 12/27/18 0700  Weight: (!) 196.4 kg (!) 199.7 kg (!) 186.5 kg    Intake/Output:   Intake/Output Summary (Last 24 hours) at 12/27/2018 0834 Last data filed at 12/27/2018 0800 Gross per 24 hour  Intake 866.98 ml  Output 5650 ml  Net -4783.02 ml      Physical Exam    General: Appears chronically ill. No resp difficulty. HEENT: Normal Neck: Supple. JVP to jaw. Carotids 2+ bilat; no bruits. No thyromegaly or nodule noted. Cor: PMI nondisplaced. IRR, No M/G/R noted Lungs: CTAB, normal effort. Abdomen: Soft, non-tender, non-distended, no HSM. No bruits or masses. +BS  Extremities: No cyanosis, clubbing, or rash. R and LLE 3+ edema. BLE compression wraps Neuro: Alert & orientedx3, cranial nerves grossly intact. moves all 4 extremities w/o difficulty. Affect pleasant  Telemetry   Afib 60s with rare PVCs. Personally reviewed.   EKG    n/a  Labs    CBC No results for input(s): WBC, NEUTROABS, HGB, HCT, MCV, PLT in the last 72 hours. Basic Metabolic Panel Recent Labs    12/25/18 0435 12/26/18 0349 12/27/18 0333  NA 131* 134* 135  K 3.8 3.6 3.6  CL 98 94* 91*  CO2 23 26 29   GLUCOSE 119*  121* 97  BUN 46* 45* 44*  CREATININE 1.72* 1.61* 1.70*  CALCIUM 8.5* 9.0 8.9  MG 2.4  --  2.2   Liver Function Tests No results for input(s): AST, ALT, ALKPHOS, BILITOT, PROT, ALBUMIN in the last 72 hours. No results for input(s): LIPASE, AMYLASE in the last 72 hours. Cardiac Enzymes No results for input(s): CKTOTAL, CKMB, CKMBINDEX, TROPONINI in the last 72 hours.  BNP: BNP (last 3 results) Recent Labs    12/23/18 0004  BNP 1,221.0*    ProBNP (last 3 results) No results for input(s): PROBNP in the last 8760 hours.   D-Dimer No results for input(s): DDIMER in the last 72 hours. Hemoglobin A1C No results for input(s): HGBA1C in the last 72 hours. Fasting Lipid Panel No results for input(s): CHOL, HDL, LDLCALC, TRIG, CHOLHDL, LDLDIRECT in the last 72 hours. Thyroid Function Tests No results for input(s): TSH, T4TOTAL, T3FREE, THYROIDAB in the last 72 hours.  Invalid input(s): FREET3  Other results:   Imaging    No results found.   Medications:     Scheduled Medications: . acetaZOLAMIDE  500 mg Oral Q12H  . apixaban  5 mg Oral BID  . Chlorhexidine Gluconate Cloth  6 each Topical Q0600  . fluticasone  2 spray Each Nare QHS  . hydrocerin   Topical Daily  . lactulose  10 g  Oral Daily  . levothyroxine  125 mcg Oral Q0600  . mupirocin ointment  1 application Nasal BID  . nadolol  20 mg Oral Daily  . spironolactone  100 mg Oral BID    Infusions: . furosemide (LASIX) infusion 20 mg/hr (12/27/18 0800)    PRN Medications: acetaminophen **OR** [DISCONTINUED] acetaminophen, cycloSPORINE, [DISCONTINUED] ondansetron **OR** ondansetron (ZOFRAN) IV, oxyCODONE    Patient Profile  Albert James is a 63 year old with a history of permanent atrial fibrillation, NASH with cirrhosis, suspected OHS/OSA, and chronic diastolic CHF/RV failure.   Admitted with volume overload.   Assessment/Plan   1. Acute/Chronic Cor Pulmonale  ECHO 10/2018 EF 60-65% Severe TR, D  shaped septum, Peak PA pressure 89, severely dilated RV with severe systolic dysfunction.  - Remains massively volume overloaded - Continue lasix drip 20 mg per hour.  Continue spiro 100 mg BID Follow renal function.  Discussed limiting fluid intake to < 2 liters per day and low salt diet. Strict I and Os.   2. Pulmonary HTN PA pressures elevated on ECHO. Suspect WHO Group 3 due to COPD and OSA.  Continue oxygen. No change.   3. OHS/OSA Continue home oxygen and CPAP. On 2 liters chronically. No change.   4. Cirrhosis Suspected NASH versus congestive hepatopathy from RV failure. Denies ETOH. Cannot totally rule out component of portopulmonary hypertension. He is on spironolactone at high dose. No change.   5. Chronic A fib Rate controlled. 60s Continue eliquis 5 mg twice a day.   6. CKD Stage III Creatinine baselin ~1.5.  - Creatinine 1.61 -> 1.70 today.   7. Obesity  - Body mass index is 55.76 kg/m.  Length of Stay: [place], NP  12/27/2018, 8:34 AM  Advanced Heart Failure Team Pager [phone] (M-F; 7a - 4p)  Please contact [facility] for night-coverage after hours (4p -7a ) and weekends on [email or web address]  Patient seen with NP, agree with the above note.   Good diuresis again yesterday with weight trending down. He still has JVD and marked peripheral edema.    Severe RV failure likely due to OHS/OSA and severe COPD.  He remains markedly volume overloaded.   - Continue Lasix gtt + acetazolamide (last dose ordered for today) + spironolactone.    Creatinine mildly higher 1.6 => 1.7, follow closely.   Chronic atrial fibrillation, he is rate-controlled and on apixaban.   Very poor long-term prognosis in absence of significant weight loss.   [name] 12/27/2018 9:34 AM

## 2018-12-28 DIAGNOSIS — I5033 Acute on chronic diastolic (congestive) heart failure: Secondary | ICD-10-CM

## 2018-12-28 DIAGNOSIS — I890 Lymphedema, not elsewhere classified: Secondary | ICD-10-CM

## 2018-12-28 DIAGNOSIS — I959 Hypotension, unspecified: Secondary | ICD-10-CM

## 2018-12-28 DIAGNOSIS — E871 Hypo-osmolality and hyponatremia: Secondary | ICD-10-CM

## 2018-12-28 LAB — BASIC METABOLIC PANEL
Anion gap: 10 (ref 5–15)
BUN: 41 mg/dL — ABNORMAL HIGH (ref 8–23)
CHLORIDE: 96 mmol/L — AB (ref 98–111)
CO2: 29 mmol/L (ref 22–32)
Calcium: 8.7 mg/dL — ABNORMAL LOW (ref 8.9–10.3)
Creatinine, Ser: 1.7 mg/dL — ABNORMAL HIGH (ref 0.61–1.24)
GFR calc Af Amer: 48 mL/min — ABNORMAL LOW (ref >60)
GFR calc non Af Amer: 42 mL/min — ABNORMAL LOW (ref >60)
Glucose, Bld: 132 mg/dL — ABNORMAL HIGH (ref 70–99)
POTASSIUM: 3.6 mmol/L (ref 3.5–5.1)
Sodium: 135 mmol/L (ref 135–145)

## 2018-12-28 LAB — MAGNESIUM: Magnesium: 2.4 mg/dL (ref 1.7–2.4)

## 2018-12-28 MED ORDER — POTASSIUM CHLORIDE CRYS ER 20 MEQ PO TBCR
40.0000 meq | EXTENDED_RELEASE_TABLET | Freq: Every day | ORAL | Status: DC
  Administered 2018-12-28: 40 meq via ORAL
  Filled 2018-12-28: qty 2

## 2018-12-28 MED ORDER — ORAL CARE MOUTH RINSE
15.0000 mL | Freq: Two times a day (BID) | OROMUCOSAL | Status: DC
  Administered 2018-12-28 – 2018-12-30 (×4): 15 mL via OROMUCOSAL

## 2018-12-28 NOTE — Progress Notes (Signed)
Patient ID: Albert James, male   DOB: June 21, 1956, 63 y.o.   MRN: [id number]  PROGRESS NOTE    Albert James  NLG:[id number] DOB: August 07, 1956 DOA: 12/22/2018 PCP: Mayra Neer, MD   Brief Narrative:  63 year old male with history of chronic diastolic CHF, atrial fibrillation, non-Hodgkin's lymphoma, liver cirrhosis from NASH, peripheral vascular disease, chronic back pain presented with generalized weakness and he was found to have massive fluid overload.  [facility] was [facility] from [facility] to [facility] for further care.  Cardiology was consulted.  He was started on Lasix drip.   Assessment & Plan:   Principal Problem:   Acute cor pulmonale (HCC) Active Problems:   Chronic back pain   Acquired lymphedema of leg   CHF exacerbation (HCC)   Pressure injury of skin   Acute on chronic diastolic heart failure/severe pulmonary hypertension -Echo in December 2019 had shown preserved EF with severe pulmonary hypertension and right-sided heart failure -Heart failure team following.  Currently remains on Lasix drip along with oral spironolactone.  Monitor creatinine.  Negative balance of 27,067.7 cc since admission.  Strict input and output.  Daily weights.  Fluid restriction. -Overall prognosis is guarded to poor.   palliative care evaluation is pending.  Chronic atrial fibrillation -Rate controlled.  Continue Eliquis.  Also on nadolol  Hypothyroidism -Continue Synthroid  NASH/cirrhosis of liver -Continue nadolol and lactulose with spironolactone and Lasix.  Outpatient follow-up with GI  Morbid obesity -Outpatient follow-up  Generalized weakness -PT recommends SNF.  Social worker following  Hyponatremia -Improved.  Due to massive fluid overload  Chronic kidney disease stage III -Monitor creatinine.  DVT prophylaxis: Eliquis Code Status: Full Family Communication: None at bedside Disposition Plan: SNF in 2 to 4 days if clinically improving.  Consultants:  Heart failure team  Procedures: None  Antimicrobials: None   Subjective: Patient seen and examined at bedside.  He feels slightly better but still feels weak and tired.  Denies worsening shortness of breath or chest pain.  No overnight fever or vomiting. Objective: Vitals:   12/28/18 0400 12/28/18 0542 12/28/18 0612 12/28/18 0752  BP:   92/71 109/74  Pulse:   62 (!) 59  Resp:   17 15  Temp: 97.8 F (36.6 C)     TempSrc: Oral     SpO2:   91% 93%  Weight:  (!) 179.9 kg    Height:        Intake/Output Summary (Last 24 hours) at 12/28/2018 0952 Last data filed at 12/28/2018 0530 Gross per 24 hour  Intake 396.59 ml  Output 8200 ml  Net -7803.41 ml   Filed Weights   12/25/18 0524 12/27/18 0700 12/28/18 0542  Weight: (!) 199.7 kg (!) 186.5 kg (!) 179.9 kg    Examination:  General exam: Appears calm and comfortable.  No acute distress.  Looks older than stated age Respiratory system: Bilateral decreased breath sounds at bases, bibasilar crackles Cardiovascular system: S1 & S2 heard, intermittent bradycardia gastrointestinal system: Abdomen is morbidly obese, nondistended, soft and nontender. Normal bowel sounds heard. Extremities: No cyanosis; bilateral lower extremity 3+ edema with compression wraps  Data Reviewed: I have personally reviewed following labs and imaging studies  CBC: Recent Labs  Lab 12/23/18 0004 12/24/18 0814  WBC 10.9* 6.9  NEUTROABS 8.6*  --   HGB 14.8 13.7  HCT 48.1 44.3  MCV 94.3 93.5  PLT 121* 081*   Basic Metabolic Panel: Recent Labs  Lab 12/24/18 1047 12/25/18 0435 12/26/18 0349 12/27/18  6720 12/28/18 0451  NA 132* 131* 134* 135 135  K 4.1 3.8 3.6 3.6 3.6  CL 98 98 94* 91* 96*  CO2 23 23 26 29 29   GLUCOSE 182* 119* 121* 97 132*  BUN 47* 46* 45* 44* 41*  CREATININE 1.87* 1.72* 1.61* 1.70* 1.70*  CALCIUM 8.9 8.5* 9.0 8.9 8.7*  MG 2.7* 2.4  --  2.2 2.4   GFR: Estimated Creatinine Clearance: 73.6 mL/min (A) (by C-G formula based on  SCr of 1.7 mg/dL (H)). Liver Function Tests: Recent Labs  Lab 12/23/18 0004  AST 55*  ALT 36  ALKPHOS 79  BILITOT 2.6*  PROT 6.0*  ALBUMIN 3.6   No results for input(s): LIPASE, AMYLASE in the last 168 hours. No results for input(s): AMMONIA in the last 168 hours. Coagulation Profile: No results for input(s): INR, PROTIME in the last 168 hours. Cardiac Enzymes: Recent Labs  Lab 12/23/18 0004  TROPONINI <0.03   BNP (last 3 results) No results for input(s): PROBNP in the last 8760 hours. HbA1C: No results for input(s): HGBA1C in the last 72 hours. CBG: Recent Labs  Lab 12/24/18 1641 12/24/18 2240  GLUCAP 99 121*   Lipid Profile: No results for input(s): CHOL, HDL, LDLCALC, TRIG, CHOLHDL, LDLDIRECT in the last 72 hours. Thyroid Function Tests: No results for input(s): TSH, T4TOTAL, FREET4, T3FREE, THYROIDAB in the last 72 hours. Anemia Panel: No results for input(s): VITAMINB12, FOLATE, FERRITIN, TIBC, IRON, RETICCTPCT in the last 72 hours. Sepsis Labs: Recent Labs  Lab 12/22/18 2359 12/23/18 0328  LATICACIDVEN 2.1* 2.1*    Recent Results (from the past 240 hour(s))  MRSA PCR Screening     Status: Abnormal   Collection Time: 12/23/18  6:57 PM  Result Value Ref Range Status   MRSA by PCR POSITIVE (A) NEGATIVE Final    Comment:        The GeneXpert MRSA Assay (FDA approved for NASAL specimens only), is one component of a comprehensive MRSA colonization surveillance program. It is not intended to diagnose MRSA infection nor to guide or monitor treatment for MRSA infections. RESULT CALLED TO, READ BACK BY AND VERIFIED WITH: OVERBY,M RN 2324 12/23/2018 MITCHELL,L          Radiology Studies: No results found.      Scheduled Meds: . apixaban  5 mg Oral BID  . fluticasone  2 spray Each Nare QHS  . hydrocerin   Topical Daily  . lactulose  10 g Oral Daily  . levothyroxine  125 mcg Oral Q0600  . mouth rinse  15 mL Mouth Rinse BID  . nadolol  20 mg  Oral Daily  . potassium chloride  40 mEq Oral Daily  . spironolactone  100 mg Oral BID   Continuous Infusions: . furosemide (LASIX) infusion 20 mg/hr (12/28/18 9470)     LOS: 5 days        Aline August, MD [facility] Pager [phone]  If 7PM-7AM, please contact night-coverage [email or web address] Password [facility] 12/28/2018, 9:52 AM

## 2018-12-28 NOTE — Progress Notes (Signed)
Physical Therapy Treatment Patient Details Name: Albert James MRN: [id number] DOB: 1956-11-15 Today's Date: 12/28/2018    History of Present Illness 63yo male presenting with general weakness, history of 2 falls in the past week. Admitted for acute on chronic heart failure. PMH abdominal hernia, anxiety, LBP, A-fib, knee pain, non-Hodgkins lymphoma, PVD, shoulder RCR     PT Comments    Pt performed gait training progression.  He was educated on Management consultant.  He continues to present with decreased endurance.  Spo2 stable on 3L of O2 [place] 91%-94%.  HR 84-88 bpm.  Continue to recommend SNF placement to improve strength and function before returning home.   Follow Up Recommendations  SNF     Equipment Recommendations  Other (comment)(defer to next venue)    Recommendations for Other Services       Precautions / Restrictions Precautions Precautions: Fall;Other (comment) Precaution Comments: watch SPO2 and HR Restrictions Weight Bearing Restrictions: No    Mobility  Bed Mobility               General bed mobility comments: Standing on arrival in room with RN present.    Transfers Overall transfer level: Needs assistance Equipment used: 4-wheeled walker Transfers: Sit to/from Stand Sit to Stand: Min assist         General transfer comment: Cues for hand placement to and from seated surface.  Pt does a good job locking the rollator.    Ambulation/Gait Ambulation/Gait assistance: Min assist;+2 safety/equipment Gait Distance (Feet): 20 Feet(+ 25 ft, +15 ft, 86f, + 15 ft.  x seated rest break after 3rd trial other wise he took a standing rest break resting elbows on rollator hand grips.  ) Assistive device: 4-wheeled walker Gait Pattern/deviations: Wide base of support;Trunk flexed;Shuffle     General Gait Details: Cues for RW safety and position.  Pt required close chair follow.  No buckling noted.     Stairs             Wheelchair Mobility     Modified Rankin (Stroke Patients Only)       Balance Overall balance assessment: Needs assistance;History of Falls Sitting-balance support: Bilateral upper extremity supported;Feet supported Sitting balance-Leahy Scale: Good       Standing balance-Leahy Scale: Poor                              Cognition Arousal/Alertness: Awake/alert Behavior During Therapy: WFL for tasks assessed/performed;Flat affect Overall Cognitive Status: Within Functional Limits for tasks assessed                                        Exercises      General Comments        Pertinent Vitals/Pain Pain Assessment: Faces Faces Pain Scale: Hurts little more Pain Location: back and bottom Pain Descriptors / Indicators: Aching;Sharp Pain Intervention(s): Monitored during session;Repositioned    Home Living                      Prior Function            PT Goals (current goals can now be found in the care plan section) Acute Rehab PT Goals Patient Stated Goal: get stronger, go home  Potential to Achieve Goals: Good Progress towards PT goals: Progressing toward goals    Frequency  Min 2X/week      PT Plan Current plan remains appropriate    Co-evaluation              AM-PAC PT "6 Clicks" Mobility   Outcome Measure  Help needed turning from your back to your side while in a flat bed without using bedrails?: A Little Help needed moving from lying on your back to sitting on the side of a flat bed without using bedrails?: A Little Help needed moving to and from a bed to a chair (including a wheelchair)?: A Lot Help needed standing up from a chair using your arms (e.g., wheelchair or bedside chair)?: A Lot Help needed to walk in hospital room?: A Lot Help needed climbing 3-5 steps with a railing? : A Lot 6 Click Score: 14    End of Session Equipment Utilized During Treatment: Oxygen Activity Tolerance: Patient tolerated treatment  well Patient left: in chair;with call bell/phone within reach;with chair alarm set Nurse Communication: Mobility status PT Visit Diagnosis: Unsteadiness on feet (R26.81);Muscle weakness (generalized) (M62.81);History of falling (Z91.81);Difficulty in walking, not elsewhere classified (R26.2)     Time: 8527-7824 PT Time Calculation (min) (ACUTE ONLY): 28 min  Charges:  $Gait Training: 8-22 mins $Therapeutic Activity: 8-22 mins                     Governor Rooks, PTA Acute Rehabilitation Services Pager [phone] Office [phone]     Arlys Scatena Eli Hose 12/28/2018, 6:25 PM

## 2018-12-28 NOTE — Consult Note (Addendum)
Consultation Note Date: 12/28/2018   Patient Name: Albert James  DOB: March 12, 1956  MRN: [id number]  Age / Sex: 63 y.o., male  PCP: Mayra Neer, MD Referring Physician: Aline August, MD  Reason for Consultation: Establishing goals of care  HPI/Patient Profile: 63 y.o. male admitted on 12/22/2018 from home with complaints of generalized weakness. He has a past medical history of atrial fibrillation, liver cirrhosis (NASH), peripheral vascular disease, chronic back pain, diastolic CHF, Non-Hodgkin's lymphoma, abdominal hernia, anxiety, depression, swelling, and arthritis. During ED course patient reported his cardiologist increased Torsemide to 71m twice daily due to a 10lb weight gain. He endorsed losing about 5lbs since. Complained of frequent falls, in which he fell twice in the past week prior to admission. Denied chest pain, nausea, vomiting, or diarrhea. He did complain of shortness of breath. NA 131, BUN 1, Cr 2.21, AST 55, WBC 10.9, BNP 1221.0, lactic acid 2.1, troponin 0.03. chest x-ray showed vascular congestion and mild interstitial edema. Since admission he is followed by Cardiology with recommendations to continue on IV lasix drip. He has decreased in weight by 15lb and intake and output negative by 9L on 2/12. Palliative Medicine team consulted for goals of care discussion.   Clinical Assessment and Goals of Care: I have reviewed medical records including lab results, imaging, Epic notes, and MAR, received report from the bedside RN, and assessed the patient. I met at the bedside with patient to discuss diagnosis prognosis, GOC, EOL wishes, disposition and options. He is awake, A&O x3. Complains of some sacral discomfort, which improved after assisting with positional change.   I introduced Palliative Medicine as specialized medical care for people living with serious illness. It focuses on  providing relief from the symptoms and stress of a serious illness. The goal is to improve quality of life for both the patient and the family.  Mr. HRosenboomreports his daughter lives with him on his farm. He has over 20 acres which they care for goats, chickens, and horses. He has a cat in the home. He enjoys spending time with his family. He [place] a [place] He is man of [place]   As far as functional and nutritional status he reports he was ambulatory in the home although some complications with his gait. He reports frequent falls due to his knees "giving out". He reports holding on to furniture in his room to move around and otherwise he uses a walker in the home and outside of the home. He no longer drives. His daughter drives him to all appointments and errands. He is on Oxycodone for chronic pain. He reports he requires some assistance from his daughter with ADLs depending on how he is feeling. He is on chronic home oxygen 2L/[place] for the past year. He reports he is attempting to lose weight, but often eats pre-packaged meals or fast food at times depending on what his daughter decides.   We discussed his current illness and what it means in the larger context  of his on-going co-morbidities.  Natural disease trajectory and expectations at EOL were discussed. He verbalized understanding of his current illness and co-morbidities. He expresses he remains hopeful for improvement with awareness of chronic condition. He expresses "hopefully therapy will come see me and I can get some rehab and work on losing weight in the process."   I attempted to elicit values and goals of care important to the patient.    The difference between aggressive medical intervention and comfort care was considered in light of the patient's goals of care. Patient wishes are to continue to treat the treatable including aggressive measures.   Mr. Matuszak reports he has an advanced directive  document in his file cabinet at home. He reports his daughter Burman Nieves and son Heath Lark are both his medical decision makers in the event he needs one. I discuss with him in detail his full code status with consideration to his current illness and co-morbidities. He verbalized understanding and request to remain a full code. He reports he would want everything done including CPR, ACLS, defib, and intubation to allow him a fighting chance. He reports in the event he is considered to be in vegetative state or despite treatments he has no fighting option. He states his children are aware of his wishes also. He does express he would not want to have any forms of artificial feeding or dialysis in the event either was a needed decision. He expressed if he couldn't eat by mouth it wasn't worth it and not sure he would entertain having to go to a dialysis center several days a week.   Hospice and Palliative Care services outpatient were explained and offered. Given his wishes to continue with aggressive measures and potential rehab recommendations made for palliative support at minimum. Patient verbalized understanding of services however, expressed that he currently has a Geophysicist/field seismologist following him on a monthly basis who also comes out to his home. He verbalizes his awareness that he can initiate services at anytime.   Questions and concerns were addressed.  Hard Choices booklet left for review. Patient was encouraged to call with questions or concerns.  PMT will continue to support holistically.   Primary Decision Maker: PATIENT    SUMMARY OF RECOMMENDATIONS    Full Code  Patient wishes to continue to treat, including aggressive measures.   Expresses his children are aware of his wishes. Would not want to be kept in a vegetative state however, if there is a chance would like to exhaust all interventions first.   Declines outpatient palliative at this time. Reports his goal is to go to rehab,  attempt to lose some weight, and return home to live with his daughter. Reports insurance sends a nurse/NP out monthly to check on him. Verbalizes awareness of outpatient palliative support and in the future would consider if condition does not improve. He is aware he can speak with medical team at any point he would like to initiate services.   Does not want feeding tube or dialysis if ever a decision needs to be made.   PMT will continue to support and follow as needed.    Code Status/Advance Care Planning:  Full code   Symptom Management:   Per attending   Palliative Prophylaxis:   Bowel Regimen, Frequent Pain Assessment, Oral Care and Palliative Wound Care  Additional Recommendations (Limitations, Scope, Preferences):  Full Scope Treatment, No Artificial Feeding and No Tracheostomy  Psycho-social/Spiritual:   Desire for further Chaplaincy support: NO  Additional Recommendations: Outpatient Palliative Education  Prognosis:   Guarded to Poor in the setting of chronic back pain, morbid obesity, abdominal hernia, a-fib, acute on chronic Cor Pulmonale, pulmonary hypertension, cirrhosis, sleep apnea, CKD stage III, edema, decreased mobility, falls, chronic lymphedema, deconditioning.   Discharge Planning: SNF with rehab      Primary Diagnoses: Present on Admission: . Chronic back pain   I have reviewed the medical record, interviewed the patient and family, and examined the patient. The following aspects are pertinent.  Past Medical History:  Diagnosis Date  . Abdominal hernia    can' t  fix per pt, it is large  . Acute cor pulmonale (HCC)   . Anxiety   . Arthritis    lower back  . Atrial fibrillation, persistent 07/08/2014  . Back pain   . CHF (congestive heart failure) ([place])   . Cirrhosis ([place])    hx of   . Depression   . Edema of both legs   . Knee pain both knees   wears compression due to edema  . nhl; dx'd 2006   chemo comp, lymphoma  . Non Hodgkin's  lymphoma ([place])    hx of   . Peripheral vascular disease ([place])   . Shoulder pain   . Swelling of lower extremity    both legs, uses compression wraps all the time  . Ulcer    left leg, now healed  . Varicose veins    Social History   Socioeconomic History  . Marital status: Divorced    Spouse name: Not on file  . Number of children: 2  . Years of education: Not on file  . Highest education level: Not on file  Occupational History  . Occupation: disabled  Social Needs  . Financial resource strain: Not on file  . Food insecurity:    Worry: Not on file    Inability: Not on file  . Transportation needs:    Medical: Not on file    Non-medical: Not on file  Tobacco Use  . Smoking status: Former Smoker    Packs/day: 0.50    Years: 8.00    Pack years: 4.00    Types: Cigarettes    Start date: 09/26/1966    Last attempt to quit: 06/26/1989    Years since quitting: 29.5  . Smokeless tobacco: Never Used  . Tobacco comment: quit 38 years ago  Substance and Sexual Activity  . Alcohol use: No    Alcohol/week: 0.0 standard drinks  . Drug use: No  . Sexual activity: Not on file  Lifestyle  . Physical activity:    Days per week: Not on file    Minutes per session: Not on file  . Stress: Not on file  Relationships  . Social connections:    Talks on phone: Not on file    Gets together: Not on file    Attends religious service: Not on file    Active member of club or organization: Not on file    Attends meetings of clubs or organizations: Not on file    Relationship status: Not on file  Other Topics Concern  . Not on file  Social History Narrative  . Not on file   Family History  Problem Relation Age of Onset  . Cancer Father        pacemaker  . Bradycardia Father    Scheduled Meds: . apixaban  5 mg Oral BID  . fluticasone  2 spray Each Nare QHS  .  hydrocerin   Topical Daily  . lactulose  10 g Oral Daily  . levothyroxine  125 mcg Oral Q0600  . mouth rinse  15 mL  Mouth Rinse BID  . nadolol  20 mg Oral Daily  . potassium chloride  40 mEq Oral Daily  . spironolactone  100 mg Oral BID   Continuous Infusions: . furosemide (LASIX) infusion 20 mg/hr (12/28/18 0642)   PRN Meds:.acetaminophen **OR** [DISCONTINUED] acetaminophen, cycloSPORINE, [DISCONTINUED] ondansetron **OR** ondansetron (ZOFRAN) IV, oxyCODONE Medications Prior to Admission:  Prior to Admission medications   Medication Sig Start Date End Date Taking? Authorizing Provider  cycloSPORINE (RESTASIS) 0.05 % ophthalmic emulsion Place 1 drop into both eyes 2 (two) times daily as needed (for eye irritation/dryness).   Yes [provider]  diclofenac sodium (VOLTAREN) 1 % GEL Apply 1 g topically 4 (four) times daily as needed (for knee pain.).   Yes [provider]  fluticasone (FLONASE) 50 MCG/ACT nasal spray Place 2 sprays into both nostrils at bedtime.  04/24/12  Yes [provider]  lactulose (CHRONULAC) 10 GM/15ML solution Take 10 g by mouth daily.  11/09/11  Yes [provider]  levothyroxine (SYNTHROID, LEVOTHROID) 125 MCG tablet Take 125 mcg by mouth daily before breakfast.  06/01/17  Yes [provider]  nadolol (CORGARD) 20 MG tablet Take 20 mg by mouth daily.    Yes [provider]  NARCAN 4 MG/0.1ML LIQD nasal spray kit Place 1 spray into the nose as needed (For accidental opoid overdose).  09/05/17  Yes [provider]  omega-3 acid ethyl esters (LOVAZA) 1 g capsule Take 2 g by mouth 2 (two) times daily.  06/01/17  Yes [provider]  oxyCODONE (OXYCONTIN) 40 mg 12 hr tablet Take 1 tablet (40 mg total) by mouth every 12 (twelve) hours. 12/06/18  Yes Ennever, Rudell Cobb, MD  Oxycodone HCl 20 MG TABS Take 1 tablet (20 mg total) by mouth every 8 (eight) hours as needed. 12/06/18  Yes Volanda Napoleon, MD  spironolactone (ALDACTONE) 100 MG tablet Take 100 mg by mouth 2 (two) times daily.     Yes [provider]    torsemide (DEMADEX) 20 MG tablet Take 3 tablets (60 mg total) by mouth every morning AND 2 tablets (40 mg total) every evening. Patient taking differently: Take 3 tablets (60 mg total) by mouth every morning AND 3 tablets (60 mg total) every evening. 10/17/18  Yes Larey Dresser, MD  ELIQUIS 5 MG TABS tablet TAKE 1 TABLET(5 MG) BY MOUTH TWICE DAILY 12/18/18   Larey Dresser, MD   Allergies  Allergen Reactions  . Vancomycin Other (See Comments)    "red man" syndrome (when in large quantity)   Review of Systems  Respiratory: Positive for shortness of breath.   Cardiovascular: Positive for leg swelling.  Musculoskeletal: Positive for arthralgias.  Neurological: Positive for weakness.  All other systems reviewed and are negative.   Physical Exam Vitals signs and nursing note reviewed.  Constitutional:      General: He is awake.     Appearance: He is morbidly obese. He is ill-appearing.     Comments: Chronically ill appearing   Cardiovascular:     Rate and Rhythm: Rhythm regularly irregular.     Pulses: Normal pulses.     Heart sounds: Normal heart sounds.     Comments: A-fib Pulmonary:     Effort: Pulmonary effort is normal.     Breath sounds: Decreased breath sounds present.  Comments: 3L/[place]  Abdominal:     General: Bowel sounds are normal.  Musculoskeletal:     Comments: Generalized weakness   Skin:    General: Skin is warm and dry.     Findings: Bruising present.     Comments: Bilateral leg wraps, clean, dry, intact, perineal excoriation, redness, yeast in abdominal folds, some scattered bruising.    Neurological:     Mental Status: He is alert and oriented to person, place, and time.  Psychiatric:        Attention and Perception: Attention normal.        Mood and Affect: Mood normal.        Speech: Speech normal.        Behavior: Behavior is cooperative.        Thought Content: Thought content normal.        Cognition and Memory: Cognition normal.         Judgment: Judgment normal.    Vital Signs: BP 109/74   Pulse (!) 59   Temp 97.8 F (36.6 C) (Oral)   Resp 15   Ht 6' (1.829 m)   Wt (!) 179.9 kg   SpO2 93%   BMI 53.79 kg/m  Pain Scale: 0-10 POSS *See Group Information*: 1-Acceptable,Awake and alert Pain Score: 0-No pain   SpO2: SpO2: 93 % O2 Device:SpO2: 93 % O2 Flow Rate: .O2 Flow Rate (L/min): 3 L/min  IO: Intake/output summary:   Intake/Output Summary (Last 24 hours) at 12/28/2018 1325 Last data filed at 12/28/2018 1133 Gross per 24 hour  Intake 356.52 ml  Output 10000 ml  Net -9643.48 ml    LBM: Last BM Date: 12/25/18 Baseline Weight: Weight: (!) 196.4 kg Most recent weight: Weight: (!) 179.9 kg     Palliative Assessment/Data: PPS 40%   Flowsheet Rows     Most Recent Value  Intake Tab  Referral Department  Hospitalist  Unit at Time of Referral  Intermediate Care Unit  Palliative Care Primary Diagnosis  Cardiac  Date Notified  12/27/18  Palliative Care Type  New Palliative care  Reason for referral  Clarify Goals of Care  Date of Admission  12/22/18  # of days IP prior to Palliative referral  5  Clinical Assessment  Psychosocial & Spiritual Assessment  Palliative Care Outcomes      Time In: 0930 Time Out: 1045 Time Total: 75 min  Greater than 50%  of this time was spent counseling and coordinating care related to the above assessment and plan.  Signed by:  Alda Lea, AGPCNP-BC Palliative Medicine Team  Phone: [phone] Fax: [phone] Pager: [phone] Amion: Bjorn Pippin    Please contact Palliative Medicine Team phone at [phone] for questions and concerns.  For individual provider: See Shea Evans

## 2018-12-28 NOTE — Progress Notes (Signed)
Insurance requesting updated PT note. Will send in the morning. CSW left voicemail for son regarding SNF choice again.   Percell Locus Aurthur Wingerter LCSW [phone]

## 2018-12-28 NOTE — Progress Notes (Addendum)
Advanced Heart Failure Rounding Note  PCP-Cardiologist: No primary care provider on file.   Subjective:    Remains on lasix drip @ 20 mg/hr. I/O negative 9.3 L yesterday. Weight down another 15 lbs. SBP 90-100s. Creatinine unchanged 1.70. K 3.6.  Denies CP or SOB. Got up to chair yesterday and did okay.   Objective:   Weight Range: (!) 179.9 kg Body mass index is 53.79 kg/m.   Vital Signs:   Temp:  [97.8 F (36.6 C)-98.6 F (37 C)] 97.8 F (36.6 C) (02/13 0400) Pulse Rate:  [58-108] 62 (02/13 0612) Resp:  [12-24] 17 (02/13 0612) BP: (84-119)/(57-94) 92/71 (02/13 0612) SpO2:  [91 %-98 %] 91 % (02/13 0612) Weight:  [179.9 kg] 179.9 kg (02/13 0542) Last BM Date: 12/25/18  Weight change: Filed Weights   12/25/18 0524 12/27/18 0700 12/28/18 0542  Weight: (!) 199.7 kg (!) 186.5 kg (!) 179.9 kg    Intake/Output:   Intake/Output Summary (Last 24 hours) at 12/28/2018 0745 Last data filed at 12/28/2018 0530 Gross per 24 hour  Intake 1276.08 ml  Output 9000 ml  Net -7723.92 ml      Physical Exam    General: Appears chronically ill. No resp difficulty. Lying in bed. HEENT: Normal Neck: Supple. JVP 10-12. Carotids 2+ bilat; no bruits. No thyromegaly or nodule noted. Cor: PMI nondisplaced. IRR, No M/G/R noted Lungs: clear, diminished. On 4L Abdomen: Soft, non-tender, non-distended, no HSM. No bruits or masses. +BS  Extremities: No cyanosis, clubbing, or rash. R and LLE 2+ edema. BLE unna boots.  Neuro: Alert & orientedx3, cranial nerves grossly intact. moves all 4 extremities w/o difficulty. Affect pleasant  Telemetry   Afib 60s with rare PVCs. Personally reviewed.   EKG    No new tracings.   Labs    CBC No results for input(s): WBC, NEUTROABS, HGB, HCT, MCV, PLT in the last 72 hours. Basic Metabolic Panel Recent Labs    12/27/18 0333 12/28/18 0451  NA 135 135  K 3.6 3.6  CL 91* 96*  CO2 29 29  GLUCOSE 97 132*  BUN 44* 41*  CREATININE 1.70* 1.70*   CALCIUM 8.9 8.7*  MG 2.2 2.4   Liver Function Tests No results for input(s): AST, ALT, ALKPHOS, BILITOT, PROT, ALBUMIN in the last 72 hours. No results for input(s): LIPASE, AMYLASE in the last 72 hours. Cardiac Enzymes No results for input(s): CKTOTAL, CKMB, CKMBINDEX, TROPONINI in the last 72 hours.  BNP: BNP (last 3 results) Recent Labs    12/23/18 0004  BNP 1,221.0*    ProBNP (last 3 results) No results for input(s): PROBNP in the last 8760 hours.   D-Dimer No results for input(s): DDIMER in the last 72 hours. Hemoglobin A1C No results for input(s): HGBA1C in the last 72 hours. Fasting Lipid Panel No results for input(s): CHOL, HDL, LDLCALC, TRIG, CHOLHDL, LDLDIRECT in the last 72 hours. Thyroid Function Tests No results for input(s): TSH, T4TOTAL, T3FREE, THYROIDAB in the last 72 hours.  Invalid input(s): FREET3  Other results:   Imaging    No results found.   Medications:     Scheduled Medications: . apixaban  5 mg Oral BID  . fluticasone  2 spray Each Nare QHS  . hydrocerin   Topical Daily  . lactulose  10 g Oral Daily  . levothyroxine  125 mcg Oral Q0600  . mouth rinse  15 mL Mouth Rinse BID  . mupirocin ointment  1 application Nasal BID  . nadolol  20 mg Oral Daily  . spironolactone  100 mg Oral BID    Infusions: . furosemide (LASIX) infusion 20 mg/hr (12/28/18 0642)    PRN Medications: acetaminophen **OR** [DISCONTINUED] acetaminophen, cycloSPORINE, [DISCONTINUED] ondansetron **OR** ondansetron (ZOFRAN) IV, oxyCODONE    Patient Profile  Albert James is a 63 year old with a history of permanent atrial fibrillation, NASH with cirrhosis, suspected OHS/OSA, and chronic diastolic CHF/RV failure.   Admitted with volume overload.   Assessment/Plan   1. Acute/Chronic Cor Pulmonale  ECHO 10/2018 EF 60-65% Severe TR, D shaped septum, Peak PA pressure 89, severely dilated RV with severe systolic dysfunction.  - Volume starting to improve.    - Continue lasix drip 20 mg per hour at least for today. Weight coming down.  Continue spiro 100 mg BID Follow renal function.  Discussed limiting fluid intake to < 2 liters per day and low salt diet. Strict I and Os.   2. Pulmonary HTN PA pressures elevated on ECHO. Suspect WHO Group 3 due to COPD and OSA.  Continue oxygen. No change.    3. OHS/OSA Continue home oxygen and CPAP. On 2 liters chronically. On 4 L currently.   4. Cirrhosis Suspected NASH versus congestive hepatopathy from RV failure. Denies ETOH. Cannot totally rule out component of portopulmonary hypertension. He is on spironolactone at high dose. No change.   5. Chronic A fib Rate controlled HR 60s. Continue eliquis 5 mg twice a day.   6. CKD Stage III Creatinine baselin ~1.5.  - Creatinine 1.61 -> 1.70 -> 1.70 today.   7. Obesity  - Body mass index is 53.79 kg/m.   8. Deconditioning - Planning for SNF at [place].  Length of Stay: [place], NP  12/28/2018, 7:45 AM  Advanced Heart Failure Team Pager [phone] (M-F; [place])  Please contact [facility] for night-coverage after hours (4p -7a ) and weekends on [email or web address]  Patient seen with NP, agree with the above note.   Good diuresis again yesterday with weight trending down. He still has JVD and marked peripheral edema.    Severe RV failure likely due to OHS/OSA and severe COPD. He remains volume overloaded but improving. Creatinine stable at 1.7.    - Continue Lasix gtt + spironolactone without change today.   Chronic atrial fibrillation, he is rate-controlled and on apixaban.   Very poor long-term prognosis in absence of significant weight loss.  Will need SNF at discharge.   [name] 12/28/2018 9:37 AM

## 2018-12-29 LAB — CBC WITH DIFFERENTIAL/PLATELET
Abs Immature Granulocytes: 0.06 10*3/uL (ref 0.00–0.07)
BASOS PCT: 1 %
Basophils Absolute: 0 10*3/uL (ref 0.0–0.1)
EOS ABS: 0.1 10*3/uL (ref 0.0–0.5)
Eosinophils Relative: 2 %
HCT: 46.3 % (ref 39.0–52.0)
Hemoglobin: 14.4 g/dL (ref 13.0–17.0)
Immature Granulocytes: 1 %
Lymphocytes Relative: 14 %
Lymphs Abs: 0.9 10*3/uL (ref 0.7–4.0)
MCH: 28.7 pg (ref 26.0–34.0)
MCHC: 31.1 g/dL (ref 30.0–36.0)
MCV: 92.4 fL (ref 80.0–100.0)
Monocytes Absolute: 0.7 10*3/uL (ref 0.1–1.0)
Monocytes Relative: 11 %
Neutro Abs: 4.9 10*3/uL (ref 1.7–7.7)
Neutrophils Relative %: 71 %
Platelets: 114 10*3/uL — ABNORMAL LOW (ref 150–400)
RBC: 5.01 MIL/uL (ref 4.22–5.81)
RDW: 22.6 % — ABNORMAL HIGH (ref 11.5–15.5)
WBC: 6.8 10*3/uL (ref 4.0–10.5)
nRBC: 0 % (ref 0.0–0.2)

## 2018-12-29 LAB — BASIC METABOLIC PANEL
Anion gap: 12 (ref 5–15)
BUN: 42 mg/dL — ABNORMAL HIGH (ref 8–23)
CALCIUM: 9.2 mg/dL (ref 8.9–10.3)
CO2: 30 mmol/L (ref 22–32)
Chloride: 92 mmol/L — ABNORMAL LOW (ref 98–111)
Creatinine, Ser: 1.7 mg/dL — ABNORMAL HIGH (ref 0.61–1.24)
GFR calc Af Amer: 48 mL/min — ABNORMAL LOW (ref >60)
GFR, EST NON AFRICAN AMERICAN: 42 mL/min — AB (ref >60)
Glucose, Bld: 120 mg/dL — ABNORMAL HIGH (ref 70–99)
Potassium: 3.4 mmol/L — ABNORMAL LOW (ref 3.5–5.1)
SODIUM: 134 mmol/L — AB (ref 135–145)

## 2018-12-29 LAB — MAGNESIUM: MAGNESIUM: 2.5 mg/dL — AB (ref 1.7–2.4)

## 2018-12-29 MED ORDER — POTASSIUM CHLORIDE CRYS ER 20 MEQ PO TBCR
40.0000 meq | EXTENDED_RELEASE_TABLET | Freq: Every day | ORAL | Status: DC
  Administered 2018-12-30: 40 meq via ORAL
  Filled 2018-12-29: qty 2

## 2018-12-29 MED ORDER — TORSEMIDE 20 MG PO TABS
80.0000 mg | ORAL_TABLET | Freq: Two times a day (BID) | ORAL | Status: DC
  Administered 2018-12-29 – 2018-12-30 (×2): 80 mg via ORAL
  Filled 2018-12-29 (×2): qty 4

## 2018-12-29 MED ORDER — POTASSIUM CHLORIDE CRYS ER 20 MEQ PO TBCR
40.0000 meq | EXTENDED_RELEASE_TABLET | Freq: Two times a day (BID) | ORAL | Status: AC
  Administered 2018-12-29 (×2): 40 meq via ORAL
  Filled 2018-12-29 (×2): qty 2

## 2018-12-29 NOTE — Progress Notes (Signed)
Patient ID: Albert James, male   DOB: 10/27/1956, 63 y.o.   MRN: [id number]  PROGRESS NOTE    Albert James  LSL:[id number] DOB: [birth year] DOA: 12/22/2018 PCP: Mayra Neer, MD   Brief Narrative:  63 year old male with history of chronic diastolic CHF, atrial fibrillation, non-Hodgkin's lymphoma, liver cirrhosis from NASH, peripheral vascular disease, chronic back pain presented with generalized weakness and he was found to have massive fluid overload.  He was transferred from [facility] to [facility] for further care.  Cardiology was consulted.  He was started on Lasix drip.   Assessment & Plan:   Principal Problem:   Acute cor pulmonale (HCC) Active Problems:   Chronic back pain   Acquired lymphedema of leg   CHF exacerbation (HCC)   Pressure injury of skin   Acute on chronic diastolic heart failure/severe pulmonary hypertension -Echo in December 2019 had shown preserved EF with severe pulmonary hypertension and right-sided heart failure -Heart failure team following.  Currently remains on Lasix drip along with oral spironolactone.  Monitor creatinine.  Negative balance of 34.777.7 cc since admission.  Strict input and output.  Daily weights.  Fluid restriction. -Overall prognosis is guarded to poor.   palliative care evaluation appreciated.  Chronic atrial fibrillation -Rate controlled.  Continue Eliquis.  Also on nadolol  Hypothyroidism -Continue Synthroid  NASH/cirrhosis of liver -Continue nadolol and lactulose with spironolactone and Lasix.  Outpatient follow-up with GI  Morbid obesity -Outpatient follow-up  Generalized weakness -PT recommends SNF.  Social worker following  Hyponatremia -Improved.  Due to massive fluid overload  Chronic kidney disease stage III -Creatinine remains stable at 1.7 today.  Monitor creatinine.  DVT prophylaxis: Eliquis Code Status: Full Family Communication: None at bedside Disposition Plan: SNF in 2 to 4 days  if clinically improving.  Consultants: Heart failure team  Procedures: None  Antimicrobials: None   Subjective: Patient seen and examined at bedside.  No overnight worsening shortness of breath or cough, fever or vomiting. Objective: Vitals:   12/28/18 1957 12/29/18 0016 12/29/18 0130 12/29/18 0501  BP: 106/74     Pulse:      Resp: 18     Temp:  97.9 F (36.6 C)  97.9 F (36.6 C)  TempSrc:  Oral  Axillary  SpO2: 100%     Weight:   (!) 176.7 kg   Height:        Intake/Output Summary (Last 24 hours) at 12/29/2018 0904 Last data filed at 12/29/2018 0500 Gross per 24 hour  Intake 240 ml  Output 7950 ml  Net -7710 ml   Filed Weights   12/27/18 0700 12/28/18 0542 12/29/18 0130  Weight: (!) 186.5 kg (!) 179.9 kg (!) 176.7 kg    Examination:  General exam: Appears calm and comfortable.  No distress.  Looks older than stated age Respiratory system: Bilateral decreased breath sounds at bases, bibasilar crackles.  No wheezing Cardiovascular system: S1 & S2 heard, rate controlled  gastrointestinal system: Abdomen is morbidly obese, nondistended, soft and nontender. Normal bowel sounds heard. Extremities: No cyanosis; bilateral lower extremity 2-3+ edema with compression wraps  Data Reviewed: I have personally reviewed following labs and imaging studies  CBC: Recent Labs  Lab 12/23/18 0004 12/24/18 0814 12/29/18 0418  WBC 10.9* 6.9 6.8  NEUTROABS 8.6*  --  4.9  HGB 14.8 13.7 14.4  HCT 48.1 44.3 46.3  MCV 94.3 93.5 92.4  PLT 121* 143* 115*   Basic Metabolic Panel: Recent Labs  Lab 12/24/18 1047  12/25/18 0435 12/26/18 0349 12/27/18 0333 12/28/18 0451 12/29/18 0418  NA 132* 131* 134* 135 135 134*  K 4.1 3.8 3.6 3.6 3.6 3.4*  CL 98 98 94* 91* 96* 92*  CO2 23 23 26 29 29 30   GLUCOSE 182* 119* 121* 97 132* 120*  BUN 47* 46* 45* 44* 41* 42*  CREATININE 1.87* 1.72* 1.61* 1.70* 1.70* 1.70*  CALCIUM 8.9 8.5* 9.0 8.9 8.7* 9.2  MG 2.7* 2.4  --  2.2 2.4 2.5*    GFR: Estimated Creatinine Clearance: 72.8 mL/min (A) (by C-G formula based on SCr of 1.7 mg/dL (H)). Liver Function Tests: Recent Labs  Lab 12/23/18 0004  AST 55*  ALT 36  ALKPHOS 79  BILITOT 2.6*  PROT 6.0*  ALBUMIN 3.6   No results for input(s): LIPASE, AMYLASE in the last 168 hours. No results for input(s): AMMONIA in the last 168 hours. Coagulation Profile: No results for input(s): INR, PROTIME in the last 168 hours. Cardiac Enzymes: Recent Labs  Lab 12/23/18 0004  TROPONINI <0.03   BNP (last 3 results) No results for input(s): PROBNP in the last 8760 hours. HbA1C: No results for input(s): HGBA1C in the last 72 hours. CBG: Recent Labs  Lab 12/24/18 1641 12/24/18 2240  GLUCAP 99 121*   Lipid Profile: No results for input(s): CHOL, HDL, LDLCALC, TRIG, CHOLHDL, LDLDIRECT in the last 72 hours. Thyroid Function Tests: No results for input(s): TSH, T4TOTAL, FREET4, T3FREE, THYROIDAB in the last 72 hours. Anemia Panel: No results for input(s): VITAMINB12, FOLATE, FERRITIN, TIBC, IRON, RETICCTPCT in the last 72 hours. Sepsis Labs: Recent Labs  Lab 12/22/18 2359 12/23/18 0328  LATICACIDVEN 2.1* 2.1*    Recent Results (from the past 240 hour(s))  MRSA PCR Screening     Status: Abnormal   Collection Time: 12/23/18  6:57 PM  Result Value Ref Range Status   MRSA by PCR POSITIVE (A) NEGATIVE Final    Comment:        The GeneXpert MRSA Assay (FDA approved for NASAL specimens only), is one component of a comprehensive MRSA colonization surveillance program. It is not intended to diagnose MRSA infection nor to guide or monitor treatment for MRSA infections. RESULT CALLED TO, READ BACK BY AND VERIFIED WITH: OVERBY,M RN 2324 12/23/2018 MITCHELL,L          Radiology Studies: No results found.      Scheduled Meds: . apixaban  5 mg Oral BID  . fluticasone  2 spray Each Nare QHS  . hydrocerin   Topical Daily  . lactulose  10 g Oral Daily  .  levothyroxine  125 mcg Oral Q0600  . mouth rinse  15 mL Mouth Rinse BID  . nadolol  20 mg Oral Daily  . potassium chloride  40 mEq Oral BID  . [START ON 12/30/2018] potassium chloride  40 mEq Oral Daily  . spironolactone  100 mg Oral BID   Continuous Infusions: . furosemide (LASIX) infusion 20 mg/hr (12/28/18 2017)     LOS: 6 days        Aline August, MD [facility] Pager [phone]  If 7PM-7AM, please contact night-coverage [email or web address] Password [facility] 12/29/2018, 9:04 AM

## 2018-12-29 NOTE — Progress Notes (Signed)
Insurance approval received for patient to discharge to [facility] when stable. Expires on 01/01/19: [id number]. Patient's son updated.  Percell Locus Micai Apolinar LCSW [phone]

## 2018-12-29 NOTE — Progress Notes (Signed)
Patient's son contacted CSW to choose [facility]. CSW updated insurance and faxed in requested PT note.   Percell Locus Walter Grima LCSW [phone]

## 2018-12-29 NOTE — Progress Notes (Addendum)
Advanced Heart Failure Rounding Note  PCP-Cardiologist: No primary care provider on file.   Subjective:    Remains on lasix drip @ 20 mg/hr + high dose spiro. Creatinine unchanged 1.70, K 3.4. I/O negative 7.7 L. Weight down 7 more lbs (51 lbs total).   Palliative saw him yesterday. He wants to stay full code. Planning for SNF once ready for DC.  No CP or SOB. Sitting up in chair this morning.  Objective:   Weight Range: (!) 176.7 kg Body mass index is 52.83 kg/m.   Vital Signs:   Temp:  [97.9 F (36.6 C)] 97.9 F (36.6 C) (02/14 0501) Pulse Rate:  [59] 59 (02/13 0752) Resp:  [15-18] 18 (02/13 1957) BP: (106-109)/(74) 106/74 (02/13 1957) SpO2:  [93 %-100 %] 100 % (02/13 1957) Weight:  [176.7 kg] 176.7 kg (02/14 0130) Last BM Date: 12/25/18  Weight change: Filed Weights   12/27/18 0700 12/28/18 0542 12/29/18 0130  Weight: (!) 186.5 kg (!) 179.9 kg (!) 176.7 kg    Intake/Output:   Intake/Output Summary (Last 24 hours) at 12/29/2018 0720 Last data filed at 12/29/2018 0500 Gross per 24 hour  Intake 240 ml  Output 7950 ml  Net -7710 ml      Physical Exam    General: Appears chronically ill. No resp difficulty. Lying in bed.  HEENT: Normal Neck: Supple. JVP 7-8. Carotids 2+ bilat; no bruits. No thyromegaly or nodule noted. Cor: PMI nondisplaced. IRR, No M/G/R noted Lungs: distant, clear. On 4 L [place].  Abdomen: Soft, non-tender, non-distended, no HSM. No bruits or masses. +BS  Extremities: No cyanosis, clubbing, or rash. R and LLE 2+ edema. BLE unna boots Neuro: Alert & orientedx3, cranial nerves grossly intact. moves all 4 extremities w/o difficulty. Affect pleasant  Telemetry   Afib 60-70s rare PVCs. Personally reviewed.   EKG    No new tracings.   Labs    CBC Recent Labs    12/29/18 0418  WBC 6.8  NEUTROABS 4.9  HGB 14.4  HCT 46.3  MCV 92.4  PLT 832*   Basic Metabolic Panel Recent Labs    12/28/18 0451 12/29/18 0418  NA 135 134*  K  3.6 3.4*  CL 96* 92*  CO2 29 30  GLUCOSE 132* 120*  BUN 41* 42*  CREATININE 1.70* 1.70*  CALCIUM 8.7* 9.2  MG 2.4 2.5*   Liver Function Tests No results for input(s): AST, ALT, ALKPHOS, BILITOT, PROT, ALBUMIN in the last 72 hours. No results for input(s): LIPASE, AMYLASE in the last 72 hours. Cardiac Enzymes No results for input(s): CKTOTAL, CKMB, CKMBINDEX, TROPONINI in the last 72 hours.  BNP: BNP (last 3 results) Recent Labs    12/23/18 0004  BNP 1,221.0*    ProBNP (last 3 results) No results for input(s): PROBNP in the last 8760 hours.   D-Dimer No results for input(s): DDIMER in the last 72 hours. Hemoglobin A1C No results for input(s): HGBA1C in the last 72 hours. Fasting Lipid Panel No results for input(s): CHOL, HDL, LDLCALC, TRIG, CHOLHDL, LDLDIRECT in the last 72 hours. Thyroid Function Tests No results for input(s): TSH, T4TOTAL, T3FREE, THYROIDAB in the last 72 hours.  Invalid input(s): FREET3  Other results:   Imaging    No results found.   Medications:     Scheduled Medications: . apixaban  5 mg Oral BID  . fluticasone  2 spray Each Nare QHS  . hydrocerin   Topical Daily  . lactulose  10 g Oral  Daily  . levothyroxine  125 mcg Oral Q0600  . mouth rinse  15 mL Mouth Rinse BID  . nadolol  20 mg Oral Daily  . potassium chloride  40 mEq Oral Daily  . spironolactone  100 mg Oral BID    Infusions: . furosemide (LASIX) infusion 20 mg/hr (12/28/18 2017)    PRN Medications: acetaminophen **OR** [DISCONTINUED] acetaminophen, cycloSPORINE, [DISCONTINUED] ondansetron **OR** ondansetron (ZOFRAN) IV, oxyCODONE    Patient Profile  Albert James is a 63 year old with a history of permanent atrial fibrillation, NASH with cirrhosis, suspected OHS/OSA, and chronic diastolic CHF/RV failure.   Admitted with volume overload.   Assessment/Plan   1. Acute/Chronic Cor Pulmonale  ECHO 10/2018 EF 60-65% Severe TR, D shaped septum, Peak PA pressure 89,  severely dilated RV with severe systolic dysfunction.  - Volume improving.  - Continue lasix drip 20 mg per hour. Weight down 51 lbs so far. Creatinine unchanged.  - Continue spiro 100 mg BID Follow renal function.  Discussed limiting fluid intake to < 2 liters per day and low salt diet. Strict I and Os.   2. Pulmonary HTN PA pressures elevated on ECHO. Suspect WHO Group 3 due to COPD and OSA.  Continue oxygen. No change.   3. OHS/OSA Continue home oxygen and CPAP. On 2 liters chronically. On 4 L currently.   4. Cirrhosis Suspected NASH versus congestive hepatopathy from RV failure. Denies ETOH. Cannot totally rule out component of portopulmonary hypertension. He is on spironolactone at high dose. No change.   5. Chronic A fib Rate stable 60s Continue eliquis 5 mg twice a day.   6. CKD Stage III Creatinine baselin ~1.5.  - Creatinine 1.61 -> 1.70 -> 1.70 -> 1.70 today.   7. Obesity  - Body mass index is 52.83 kg/m.   8. Deconditioning - Planning for SNF at DC. No change.   9. Prognosis - Palliative saw him yesterday. He remains full code.   10. Hypokalemia - K 3.4. Supp.   Length of Stay: [place], NP  12/29/2018, 7:20 AM  Advanced Heart Failure Team Pager [phone] (M-F; 7a - 4p)  Please contact [facility] for night-coverage after hours (4p -7a ) and weekends on [email or web address]  Patient seen with NP, agree with the above note.   Good diuresis again yesterday with weight trending down. Still with chronic lower leg edema but no JVD now.  Severe RV failure likely due to OHS/OSA and severe COPD. I think that we are not going to be able to get rid of all his chronic LE edema due to component of venous insufficiency.  No JVD now.  Breathing better when he walks in the hall. Creatinine stable at 1.7.   - Stop Lasix gtt, start torsemide 80 mg bid.   Chronic atrial fibrillation, he is rate-controlled and on apixaban.   Very poor long-term  prognosis in absence of significant weight loss.  Will need SNF at discharge.   Albert James 12/29/2018 9:35 AM

## 2018-12-30 DIAGNOSIS — M2550 Pain in unspecified joint: Secondary | ICD-10-CM | POA: Diagnosis not present

## 2018-12-30 DIAGNOSIS — I5040 Unspecified combined systolic (congestive) and diastolic (congestive) heart failure: Secondary | ICD-10-CM | POA: Diagnosis not present

## 2018-12-30 DIAGNOSIS — M6281 Muscle weakness (generalized): Secondary | ICD-10-CM | POA: Diagnosis not present

## 2018-12-30 DIAGNOSIS — K7581 Nonalcoholic steatohepatitis (NASH): Secondary | ICD-10-CM | POA: Diagnosis not present

## 2018-12-30 DIAGNOSIS — K7469 Other cirrhosis of liver: Secondary | ICD-10-CM

## 2018-12-30 DIAGNOSIS — I872 Venous insufficiency (chronic) (peripheral): Secondary | ICD-10-CM | POA: Diagnosis not present

## 2018-12-30 DIAGNOSIS — R0902 Hypoxemia: Secondary | ICD-10-CM | POA: Diagnosis not present

## 2018-12-30 DIAGNOSIS — I5032 Chronic diastolic (congestive) heart failure: Secondary | ICD-10-CM | POA: Diagnosis not present

## 2018-12-30 DIAGNOSIS — I272 Pulmonary hypertension, unspecified: Secondary | ICD-10-CM | POA: Diagnosis not present

## 2018-12-30 DIAGNOSIS — I5033 Acute on chronic diastolic (congestive) heart failure: Secondary | ICD-10-CM | POA: Diagnosis not present

## 2018-12-30 DIAGNOSIS — I890 Lymphedema, not elsewhere classified: Secondary | ICD-10-CM | POA: Diagnosis not present

## 2018-12-30 DIAGNOSIS — M545 Low back pain: Secondary | ICD-10-CM | POA: Diagnosis not present

## 2018-12-30 DIAGNOSIS — Z7401 Bed confinement status: Secondary | ICD-10-CM | POA: Diagnosis not present

## 2018-12-30 DIAGNOSIS — R609 Edema, unspecified: Secondary | ICD-10-CM | POA: Diagnosis not present

## 2018-12-30 DIAGNOSIS — I482 Chronic atrial fibrillation, unspecified: Secondary | ICD-10-CM | POA: Diagnosis not present

## 2018-12-30 DIAGNOSIS — C8590 Non-Hodgkin lymphoma, unspecified, unspecified site: Secondary | ICD-10-CM | POA: Diagnosis not present

## 2018-12-30 DIAGNOSIS — N183 Chronic kidney disease, stage 3 (moderate): Secondary | ICD-10-CM | POA: Diagnosis not present

## 2018-12-30 DIAGNOSIS — Z6841 Body Mass Index (BMI) 40.0 and over, adult: Secondary | ICD-10-CM | POA: Diagnosis not present

## 2018-12-30 DIAGNOSIS — Z7901 Long term (current) use of anticoagulants: Secondary | ICD-10-CM | POA: Diagnosis not present

## 2018-12-30 DIAGNOSIS — E039 Hypothyroidism, unspecified: Secondary | ICD-10-CM | POA: Diagnosis not present

## 2018-12-30 LAB — BASIC METABOLIC PANEL
Anion gap: 14 (ref 5–15)
BUN: 47 mg/dL — ABNORMAL HIGH (ref 8–23)
CHLORIDE: 92 mmol/L — AB (ref 98–111)
CO2: 30 mmol/L (ref 22–32)
Calcium: 9.4 mg/dL (ref 8.9–10.3)
Creatinine, Ser: 1.75 mg/dL — ABNORMAL HIGH (ref 0.61–1.24)
GFR calc non Af Amer: 40 mL/min — ABNORMAL LOW (ref >60)
GFR, EST AFRICAN AMERICAN: 47 mL/min — AB (ref >60)
Glucose, Bld: 95 mg/dL (ref 70–99)
Potassium: 4.4 mmol/L (ref 3.5–5.1)
Sodium: 136 mmol/L (ref 135–145)

## 2018-12-30 LAB — MAGNESIUM: Magnesium: 2.7 mg/dL — ABNORMAL HIGH (ref 1.7–2.4)

## 2018-12-30 MED ORDER — POTASSIUM CHLORIDE CRYS ER 20 MEQ PO TBCR: 40.0000 meq | EXTENDED_RELEASE_TABLET | Freq: Every day | ORAL | Status: DC

## 2018-12-30 MED ORDER — TORSEMIDE 20 MG PO TABS: 80.0000 mg | ORAL_TABLET | Freq: Two times a day (BID) | ORAL | Status: DC

## 2018-12-30 MED ORDER — OXYCODONE HCL 20 MG PO TABS: 20.0000 mg | ORAL_TABLET | Freq: Three times a day (TID) | ORAL | 0 refills | Status: DC | PRN

## 2018-12-30 NOTE — Progress Notes (Signed)
Albert James to be D/C'd to [facility] per MD order. Report called to [place]. RN  VVS, Skin clean, dry and intact without evidence of skin break down, no evidence of skin tears noted.  IV catheter discontinued intact by Advertising copywriter. Site without signs and symptoms of complications. Dressing and pressure applied.  An After Visit Summary was printed and given to the patient.  Patient escorted via stretcher, and D/C to SNF via [facility].  Albert James  12/30/2018 12:54 PM

## 2018-12-30 NOTE — Progress Notes (Signed)
Patient ID: Albert James, male   DOB: Nov 23, 1955, 63 y.o.   MRN: [id number]  No changes today.  Patient ready for SNF on current torsemide dose.   [name] 12/30/2018 10:03 AM

## 2018-12-30 NOTE — Progress Notes (Signed)
Patient will Discharge To: [facility] Anticipated DC Date:12/30/2018 Family Notified:yes, son Azarel Banner [phone] Transport By: Corey Harold   Per MD patient ready for DC to  [facility]. RN, patient, patient's family, and facility notified of DC. Assessment, Fl2/Pasrr, and Discharge Summary sent to facility. RN given number for report [phone], Room # 117 B). DC packet on chart. Ambulance transport requested for patient.   CSW signing off.  Reed Breech LCSWA [phone]

## 2018-12-30 NOTE — Discharge Summary (Signed)
Physician Discharge Summary  Albert James HEN:[id number] DOB: [birth year] DOA: 12/22/2018  PCP: Mayra Neer, MD  Admit date: 12/22/2018 Discharge date: 12/30/2018  Admitted From: Home Disposition:  SNF  Recommendations for Outpatient Follow-up:  1. Follow up with SNF provider at earliest convenience with repeat BMP in the next few days 2. Follow-up with heart failure clinic/cardiology as an outpatient 3. Follow-up in the ED if symptoms worsen or new appear   Home Health: No Equipment/Devices: None  Discharge Condition: Guarded CODE STATUS: Full Diet recommendation: Heart Healthy /fluid restriction of up to 1200 cc a day  Brief/Interim Summary: 63 year old male with history of chronic diastolic CHF, atrial fibrillation, non-Hodgkin's lymphoma, liver cirrhosis from NASH, peripheral vascular disease, chronic back pain presented with generalized weakness and he was found to have massive fluid overload.  He was transferred from [facility] to [facility] for further care.  Cardiology was consulted.  He was started on Lasix drip.  During the hospitalization, patient's condition improved.  He has diuresed well.  Lasix drip was changed to oral torsemide.  He was very deconditioned and PT recommended SNF.  He will be discharged to SNF once bed is available with outpatient follow-up with cardiology/heart failure clinic.  Discharge Diagnoses:  Principal Problem:   Acute cor pulmonale (HCC) Active Problems:   Chronic back pain   Acquired lymphedema of leg   CHF exacerbation (HCC)   Pressure injury of skin   Acute on chronic diastolic heart failure/severe pulmonary hypertension -Echo in December 2019 had shown preserved EF with severe pulmonary hypertension and right-sided heart failure -Heart failure team following.  Treated with Lasix drip along with spironolactone.  Patient has diuresed well.  Negative balance of  35,282.7 cc since admission.  Lasix drip was switched to oral  torsemide on 12/29/2018.  Heart failure team has cleared the patient for discharge on torsemide 80 mg twice a day along with current dose of spironolactone.  Outpatient follow-up with heart failure team.  Monitor BMP in SNF. -Overall prognosis is guarded to poor.   palliative care evaluation appreciated.  Chronic atrial fibrillation -Rate controlled.  Continue Eliquis.  Also on nadolol  Hypothyroidism -Continue Synthroid  NASH/cirrhosis of liver -Continue nadolol and lactulose with spironolactone and Lasix.  Outpatient follow-up with GI  Morbid obesity -Outpatient follow-up  Generalized weakness -PT recommends SNF.    Hyponatremia -Improved.  Due to massive fluid overload  Chronic kidney disease stage III -Creatinine remains stable at 1.75 today.  Monitor creatinine.  Discharge Instructions  Discharge Instructions    (HEART FAILURE PATIENTS) Call MD:  Anytime you have any of the following symptoms: 1) 3 pound weight gain in 24 hours or 5 pounds in 1 week 2) shortness of breath, with or without a dry hacking cough 3) swelling in the hands, feet or stomach 4) if you have to sleep on extra pillows at night in order to breathe.   Complete by:  As directed    AMB referral to CHF clinic   Complete by:  As directed    CHF followup   Call MD for:  difficulty breathing, headache or visual disturbances   Complete by:  As directed    Call MD for:  extreme fatigue   Complete by:  As directed    Call MD for:  hives   Complete by:  As directed    Call MD for:  persistant dizziness or light-headedness   Complete by:  As directed    Call MD for:  persistant nausea and vomiting   Complete by:  As directed    Call MD for:  severe uncontrolled pain   Complete by:  As directed    Call MD for:  temperature >100.4   Complete by:  As directed    Diet - low sodium heart healthy   Complete by:  As directed    Increase activity slowly   Complete by:  As directed      Allergies as of  12/30/2018      Reactions   Vancomycin Other (See Comments)   "red man" syndrome (when in large quantity)      Medication List    TAKE these medications   ALDACTONE 100 MG tablet Generic drug:  spironolactone Take 100 mg by mouth 2 (two) times daily.   cycloSPORINE 0.05 % ophthalmic emulsion Commonly known as:  RESTASIS Place 1 drop into both eyes 2 (two) times daily as needed (for eye irritation/dryness).   diclofenac sodium 1 % Gel Commonly known as:  VOLTAREN Apply 1 g topically 4 (four) times daily as needed (for knee pain.).   ELIQUIS 5 MG Tabs tablet Generic drug:  apixaban TAKE 1 TABLET(5 MG) BY MOUTH TWICE DAILY   fluticasone 50 MCG/ACT nasal spray Commonly known as:  FLONASE Place 2 sprays into both nostrils at bedtime.   lactulose 10 GM/15ML solution Commonly known as:  CHRONULAC Take 10 g by mouth daily.   levothyroxine 125 MCG tablet Commonly known as:  SYNTHROID, LEVOTHROID Take 125 mcg by mouth daily before breakfast.   nadolol 20 MG tablet Commonly known as:  CORGARD Take 20 mg by mouth daily.   NARCAN 4 MG/0.1ML Liqd nasal spray kit Generic drug:  naloxone Place 1 spray into the nose as needed (For accidental opoid overdose).   omega-3 acid ethyl esters 1 g capsule Commonly known as:  LOVAZA Take 2 g by mouth 2 (two) times daily.   Oxycodone HCl 20 MG Tabs Take 1 tablet (20 mg total) by mouth every 8 (eight) hours as needed. What changed:  Another medication with the same name was removed. Continue taking this medication, and follow the directions you see here.   potassium chloride SA 20 MEQ tablet Commonly known as:  K-DUR,KLOR-CON Take 2 tablets (40 mEq total) by mouth daily.   torsemide 20 MG tablet Commonly known as:  DEMADEX Take 4 tablets (80 mg total) by mouth 2 (two) times daily. What changed:  See the new instructions.       Contact information for follow-up providers    [facility].   Specialty:  Cardiology Contact information: [address] [id number] [place] [phone]           Contact information for after-discharge care    Destination    [facility] Preferred SNF .   Service:  Skilled Nursing Contact information: [address] [address] [phone]                 Allergies  Allergen Reactions  . Vancomycin Other (See Comments)    "red man" syndrome (when in large quantity)    Consultations:  Heart failure team   Procedures/Studies: Dg Chest Port 1 View  Result Date: 12/23/2018 CLINICAL DATA:  Weakness EXAM: PORTABLE CHEST 1 VIEW COMPARISON:  11/25/2017 FINDINGS: Cardiomegaly with vascular congestion. Interstitial prominence likely reflects early interstitial edema. No effusions or acute bony abnormality. IMPRESSION: Cardiomegaly with vascular  congestion and mild interstitial edema. Electronically Signed   By: Rolm Baptise M.D.   On: 12/23/2018 00:36     Subjective: Patient seen and examined at bedside.  No overnight fever, nausea, vomiting or worsening shortness of breath.  Discharge Exam: Vitals:   12/29/18 0501 12/29/18 1100  BP:  107/79  Pulse:    Resp:    Temp: 97.9 F (36.6 C) 97.7 F (36.5 C)  SpO2:     Vitals:   12/29/18 0016 12/29/18 0130 12/29/18 0501 12/29/18 1100  BP:    107/79  Pulse:      Resp:      Temp: 97.9 F (36.6 C)  97.9 F (36.6 C) 97.7 F (36.5 C)  TempSrc: Oral  Axillary Oral  SpO2:      Weight:  (!) 176.7 kg    Height:        General: Pt is alert, awake, not in acute distress.  Morbidly obese Cardiovascular: rate controlled, S1/S2 + Respiratory: bilateral decreased breath sounds at bases with bibasilar crackles Abdominal: Soft, NT, ND, bowel sounds + Extremities: Bilateral lower extremity 2+ edema with compression wraps, no cyanosis    The results of significant diagnostics from this  hospitalization (including imaging, microbiology, ancillary and laboratory) are listed below for reference.     Microbiology: Recent Results (from the past 240 hour(s))  MRSA PCR Screening     Status: Abnormal   Collection Time: 12/23/18  6:57 PM  Result Value Ref Range Status   MRSA by PCR POSITIVE (A) NEGATIVE Final    Comment:        The GeneXpert MRSA Assay (FDA approved for NASAL specimens only), is one component of a comprehensive MRSA colonization surveillance program. It is not intended to diagnose MRSA infection nor to guide or monitor treatment for MRSA infections. RESULT CALLED TO, READ BACK BY AND VERIFIED WITH: OVERBY,M RN 2324 12/23/2018 MITCHELL,L      Labs: BNP (last 3 results) Recent Labs    12/23/18 0004  BNP 4,628.6*   Basic Metabolic Panel: Recent Labs  Lab 12/25/18 0435 12/26/18 0349 12/27/18 0333 12/28/18 0451 12/29/18 0418 12/30/18 0458  NA 131* 134* 135 135 134* 136  K 3.8 3.6 3.6 3.6 3.4* 4.4  CL 98 94* 91* 96* 92* 92*  CO2 _0 GLUCOSE 119* 121* 97 132* 120* 95  BUN 46* 45* 44* 41* 42* 47*  CREATININE 1.72* 1.61* 1.70* 1.70* 1.70* 1.75*  CALCIUM 8.5* 9.0 8.9 8.7* 9.2 9.4  MG 2.4  --  2.2 2.4 2.5* 2.7*   Liver Function Tests: No results for input(s): AST, ALT, ALKPHOS, BILITOT, PROT, ALBUMIN in the last 168 hours. No results for input(s): LIPASE, AMYLASE in the last 168 hours. No results for input(s): AMMONIA in the last 168 hours. CBC: Recent Labs  Lab 12/24/18 0814 12/29/18 0418  WBC 6.9 6.8  NEUTROABS  --  4.9  HGB 13.7 14.4  HCT 44.3 46.3  MCV 93.5 92.4  PLT 143* 114*   Cardiac Enzymes: No results for input(s): CKTOTAL, CKMB, CKMBINDEX, TROPONINI in the last 168 hours. BNP: Invalid input(s): POCBNP CBG: Recent Labs  Lab 12/24/18 1641 12/24/18 2240  GLUCAP 99 121*   D-Dimer No results for input(s): DDIMER in the last 72 hours. Hgb A1c No results for input(s): HGBA1C in the last 72 hours. Lipid  Profile No results for input(s): CHOL, HDL, LDLCALC, TRIG, CHOLHDL, LDLDIRECT in the last 72 hours. Thyroid function studies No results  for input(s): TSH, T4TOTAL, T3FREE, THYROIDAB in the last 72 hours.  Invalid input(s): FREET3 Anemia work up No results for input(s): VITAMINB12, FOLATE, FERRITIN, TIBC, IRON, RETICCTPCT in the last 72 hours. Urinalysis    Component Value Date/Time   COLORURINE YELLOW 12/23/2018 0030   APPEARANCEUR CLEAR 12/23/2018 0030   LABSPEC 1.009 12/23/2018 0030   LABSPEC 1.015 01/26/2017 1415   PHURINE 5.0 12/23/2018 0030   GLUCOSEU NEGATIVE 12/23/2018 0030   HGBUR SMALL (A) 12/23/2018 0030   BILIRUBINUR NEGATIVE 12/23/2018 0030   KETONESUR NEGATIVE 12/23/2018 0030   PROTEINUR 100 (A) 12/23/2018 0030   UROBILINOGEN 0.2 01/26/2017 1415   NITRITE NEGATIVE 12/23/2018 0030   LEUKOCYTESUR NEGATIVE 12/23/2018 0030   Sepsis Labs Invalid input(s): PROCALCITONIN,  WBC,  LACTICIDVEN Microbiology Recent Results (from the past 240 hour(s))  MRSA PCR Screening     Status: Abnormal   Collection Time: 12/23/18  6:57 PM  Result Value Ref Range Status   MRSA by PCR POSITIVE (A) NEGATIVE Final    Comment:        The GeneXpert MRSA Assay (FDA approved for NASAL specimens only), is one component of a comprehensive MRSA colonization surveillance program. It is not intended to diagnose MRSA infection nor to guide or monitor treatment for MRSA infections. RESULT CALLED TO, READ BACK BY AND VERIFIED WITH: OVERBY,M RN 2324 12/23/2018 MITCHELL,L      Time coordinating discharge: 35 minutes  SIGNED:   Aline August, MD  [facility] 12/30/2018, 10:07 AM Pager: [phone]  If 7PM-7AM, please contact night-coverage [email or web address] Password [email or web address]

## 2019-01-08 ENCOUNTER — Other Ambulatory Visit: Payer: Self-pay | Admitting: *Deleted

## 2019-01-08 DIAGNOSIS — G8929 Other chronic pain: Secondary | ICD-10-CM

## 2019-01-08 DIAGNOSIS — Z8572 Personal history of non-Hodgkin lymphomas: Secondary | ICD-10-CM

## 2019-01-08 DIAGNOSIS — M549 Dorsalgia, unspecified: Secondary | ICD-10-CM

## 2019-01-08 MED ORDER — OXYCODONE HCL ER 40 MG PO T12A: 40.0000 mg | EXTENDED_RELEASE_TABLET | Freq: Two times a day (BID) | ORAL | 0 refills | Status: DC

## 2019-01-08 MED ORDER — OXYCODONE HCL 20 MG PO TABS: 20.0000 mg | ORAL_TABLET | Freq: Three times a day (TID) | ORAL | 0 refills | Status: DC | PRN

## 2019-01-11 ENCOUNTER — Encounter (HOSPITAL_COMMUNITY): Payer: Medicare Other

## 2019-01-12 DIAGNOSIS — K7469 Other cirrhosis of liver: Secondary | ICD-10-CM | POA: Diagnosis not present

## 2019-01-12 DIAGNOSIS — I272 Pulmonary hypertension, unspecified: Secondary | ICD-10-CM | POA: Diagnosis not present

## 2019-01-12 DIAGNOSIS — I5033 Acute on chronic diastolic (congestive) heart failure: Secondary | ICD-10-CM | POA: Diagnosis not present

## 2019-01-17 DIAGNOSIS — Z9981 Dependence on supplemental oxygen: Secondary | ICD-10-CM | POA: Diagnosis not present

## 2019-01-17 DIAGNOSIS — I5020 Unspecified systolic (congestive) heart failure: Secondary | ICD-10-CM | POA: Diagnosis not present

## 2019-01-17 DIAGNOSIS — Z8572 Personal history of non-Hodgkin lymphomas: Secondary | ICD-10-CM | POA: Diagnosis not present

## 2019-01-17 DIAGNOSIS — I872 Venous insufficiency (chronic) (peripheral): Secondary | ICD-10-CM | POA: Diagnosis not present

## 2019-01-17 DIAGNOSIS — I5033 Acute on chronic diastolic (congestive) heart failure: Secondary | ICD-10-CM | POA: Diagnosis not present

## 2019-01-17 DIAGNOSIS — I272 Pulmonary hypertension, unspecified: Secondary | ICD-10-CM | POA: Diagnosis not present

## 2019-01-17 DIAGNOSIS — N183 Chronic kidney disease, stage 3 (moderate): Secondary | ICD-10-CM | POA: Diagnosis not present

## 2019-01-17 DIAGNOSIS — I890 Lymphedema, not elsewhere classified: Secondary | ICD-10-CM | POA: Diagnosis not present

## 2019-01-17 DIAGNOSIS — I739 Peripheral vascular disease, unspecified: Secondary | ICD-10-CM | POA: Diagnosis not present

## 2019-01-17 DIAGNOSIS — I50810 Right heart failure, unspecified: Secondary | ICD-10-CM | POA: Diagnosis not present

## 2019-01-17 DIAGNOSIS — I482 Chronic atrial fibrillation, unspecified: Secondary | ICD-10-CM | POA: Diagnosis not present

## 2019-01-17 DIAGNOSIS — Z6841 Body Mass Index (BMI) 40.0 and over, adult: Secondary | ICD-10-CM | POA: Diagnosis not present

## 2019-01-17 DIAGNOSIS — I2609 Other pulmonary embolism with acute cor pulmonale: Secondary | ICD-10-CM | POA: Diagnosis not present

## 2019-01-17 DIAGNOSIS — K7469 Other cirrhosis of liver: Secondary | ICD-10-CM | POA: Diagnosis not present

## 2019-01-17 DIAGNOSIS — Z7901 Long term (current) use of anticoagulants: Secondary | ICD-10-CM | POA: Diagnosis not present

## 2019-01-19 ENCOUNTER — Encounter (HOSPITAL_COMMUNITY): Payer: Self-pay

## 2019-01-19 ENCOUNTER — Ambulatory Visit (HOSPITAL_COMMUNITY)
Admission: RE | Admit: 2019-01-19 | Discharge: 2019-01-19 | Disposition: A | Discharge status: Home / Self Care | Payer: Medicare Other | Source: Ambulatory Visit | Attending: Cardiology | Admitting: Cardiology

## 2019-01-19 ENCOUNTER — Other Ambulatory Visit: Payer: Self-pay

## 2019-01-19 VITALS — BP 112/64 | HR 75 | Wt 387.0 lb

## 2019-01-19 DIAGNOSIS — Z809 Family history of malignant neoplasm, unspecified: Secondary | ICD-10-CM | POA: Insufficient documentation

## 2019-01-19 DIAGNOSIS — G4733 Obstructive sleep apnea (adult) (pediatric): Secondary | ICD-10-CM | POA: Diagnosis not present

## 2019-01-19 DIAGNOSIS — J449 Chronic obstructive pulmonary disease, unspecified: Secondary | ICD-10-CM | POA: Insufficient documentation

## 2019-01-19 DIAGNOSIS — Z8249 Family history of ischemic heart disease and other diseases of the circulatory system: Secondary | ICD-10-CM | POA: Insufficient documentation

## 2019-01-19 DIAGNOSIS — Z791 Long term (current) use of non-steroidal anti-inflammatories (NSAID): Secondary | ICD-10-CM | POA: Insufficient documentation

## 2019-01-19 DIAGNOSIS — Z79890 Hormone replacement therapy (postmenopausal): Secondary | ICD-10-CM | POA: Insufficient documentation

## 2019-01-19 DIAGNOSIS — I4819 Other persistent atrial fibrillation: Secondary | ICD-10-CM | POA: Diagnosis not present

## 2019-01-19 DIAGNOSIS — Z6841 Body Mass Index (BMI) 40.0 and over, adult: Secondary | ICD-10-CM | POA: Diagnosis not present

## 2019-01-19 DIAGNOSIS — Z79899 Other long term (current) drug therapy: Secondary | ICD-10-CM | POA: Insufficient documentation

## 2019-01-19 DIAGNOSIS — Z79891 Long term (current) use of opiate analgesic: Secondary | ICD-10-CM | POA: Diagnosis not present

## 2019-01-19 DIAGNOSIS — K7460 Unspecified cirrhosis of liver: Secondary | ICD-10-CM | POA: Diagnosis not present

## 2019-01-19 DIAGNOSIS — I4821 Permanent atrial fibrillation: Secondary | ICD-10-CM | POA: Diagnosis not present

## 2019-01-19 DIAGNOSIS — I5032 Chronic diastolic (congestive) heart failure: Secondary | ICD-10-CM | POA: Insufficient documentation

## 2019-01-19 DIAGNOSIS — Z87891 Personal history of nicotine dependence: Secondary | ICD-10-CM | POA: Insufficient documentation

## 2019-01-19 DIAGNOSIS — I2721 Secondary pulmonary arterial hypertension: Secondary | ICD-10-CM | POA: Diagnosis not present

## 2019-01-19 DIAGNOSIS — N183 Chronic kidney disease, stage 3 unspecified: Secondary | ICD-10-CM

## 2019-01-19 DIAGNOSIS — R21 Rash and other nonspecific skin eruption: Secondary | ICD-10-CM

## 2019-01-19 DIAGNOSIS — Z7901 Long term (current) use of anticoagulants: Secondary | ICD-10-CM | POA: Insufficient documentation

## 2019-01-19 DIAGNOSIS — I50810 Right heart failure, unspecified: Secondary | ICD-10-CM | POA: Diagnosis not present

## 2019-01-19 DIAGNOSIS — E785 Hyperlipidemia, unspecified: Secondary | ICD-10-CM | POA: Diagnosis not present

## 2019-01-19 LAB — BASIC METABOLIC PANEL
Anion gap: 11 (ref 5–15)
BUN: 41 mg/dL — ABNORMAL HIGH (ref 8–23)
CO2: 23 mmol/L (ref 22–32)
CREATININE: 1.54 mg/dL — AB (ref 0.61–1.24)
Calcium: 9 mg/dL (ref 8.9–10.3)
Chloride: 96 mmol/L — ABNORMAL LOW (ref 98–111)
GFR calc Af Amer: 54 mL/min — ABNORMAL LOW (ref >60)
GFR calc non Af Amer: 47 mL/min — ABNORMAL LOW (ref >60)
Glucose, Bld: 130 mg/dL — ABNORMAL HIGH (ref 70–99)
Potassium: 4.9 mmol/L (ref 3.5–5.1)
Sodium: 130 mmol/L — ABNORMAL LOW (ref 135–145)

## 2019-01-19 NOTE — Progress Notes (Signed)
Advanced Heart Failure Clinic Note   PCP: Dr. Lynnda Child Cardiology: Dr. Martinique HF Cardiology: Dr. Lynelle Doctor is a 63 y.o. male with permanent atrial fibrillation, NASH with cirrhosis, suspected OHS/OSA, and chronic diastolic CHF/RV failure was referred by Dr. Martinique for evaluation of CHF.   Patient has had atrial fibrillation since at least 2015. He has had problems with peripheral edema x years. He has also had some degree of dyspnea for a long time.  However, towards the end of 2018, dyspnea became markedly worse.  At last appointment, I started him on home oxygen.   Echo was done in 11/18 showing EF 50-55%, septal flattening, severe RV dilation, PASP 77 mmHg. LHC/RHC in 1/19 showed no CAD, evidence for RV failure and severe pulmonary hypertension.  PFTs in 1/19 showed severe COPD.  V/Q scan in 1/19 showed no evidence for chronic PEs.  Sleep study showed severe OSA, using CPAP.    Admitted 2/7 - 12/30/2018 with generalized weakness and marked volume overload. He diuresed over 50 lbs total.   He presents today for post hospital follow up. He returned home from SNF on Sunday. He is doing somewhat better at home. [facility] following. He needs a condenser to use a higher rate of O2 and an assessment for a motorized wheelchair. He is wearing CPAP every night. Rash has improved once he switched out his bed spread. Weight staying down closer to 380s at home (From 420s last office appointment. Watching his fluid, and is daughter is forcing him to limit his salt. Denies PND or orthopnea on CPAP. No lightheadedness or dizziness. Legs remains swollen.   Echo 10/2018: EF 60-65%, RV severly dilated and severly reduced function  Labs (11/19): K 5.1, creatinine 1.8, hgb 14.8  Review of systems complete and found to be negative unless listed in HPI.    PMH: 1. Non-hodgkins lymphoma: follicular small cell lymphoma.  S/p treatment, in clinical remission.  2. Cirrhosis: Suspect NASH.  - Abdominal  US (10/18): Suggestive of cirrhosis.  3. Hyperlipidemia.  4. Obesity 5. Atrial fibrillation: Suspected permanent. He appears to have been in atrial fibrillation at least back to 2015.  6. Depression 7. Venous insufficiency 8. OHS/OSA: Sleep study with severe OSA.  9. H/o vertebral osteomyelitis.  10. OA knees 11. Chronic diastolic CHF/RV failure: Echo (11/18) with EF 50-55%, septal flattening, RV severely dilated with normal systolic function, PASP 77 mmHg, moderate TR.  Echo 11/02/18: EF 60-65%, RV severly dilated and severly reduced function 12. Pulmonary hypertension: Suspect due to OHS/OSA and COPD.  - RHC (1/19): mean RA 20, PA 87/37 mean 52, mean PCWP 16, CI 2.12, PVR 5.6 WU.  - V/Q scan 1/19 with no evidence for chronic PE.                                                                                                                  13 . Coronary angiography (1/19): No significant CAD.  14. COPD: PFTs in 1/19 suggested severe COPD.   Social  History   Socioeconomic History  . Marital status: Divorced    Spouse name: Not on file  . Number of children: 2  . Years of education: Not on file  . Highest education level: Not on file  Occupational History  . Occupation: disabled  Social Needs  . Financial resource strain: Not on file  . Food insecurity:    Worry: Not on file    Inability: Not on file  . Transportation needs:    Medical: Not on file    Non-medical: Not on file  Tobacco Use  . Smoking status: Former Smoker    Packs/day: 0.50    Years: 8.00    Pack years: 4.00    Types: Cigarettes    Start date: 09/26/1966    Last attempt to quit: 06/26/1989    Years since quitting: 29.5  . Smokeless tobacco: Never Used  . Tobacco comment: quit 38 years ago  Substance and Sexual Activity  . Alcohol use: No    Alcohol/week: 0.0 standard drinks  . Drug use: No  . Sexual activity: Not on file  Lifestyle  . Physical activity:    Days per week: Not on file    Minutes per  session: Not on file  . Stress: Not on file  Relationships  . Social connections:    Talks on phone: Not on file    Gets together: Not on file    Attends religious service: Not on file    Active member of club or organization: Not on file    Attends meetings of clubs or organizations: Not on file    Relationship status: Not on file  . Intimate partner violence:    Fear of current or ex partner: Not on file    Emotionally abused: Not on file    Physically abused: Not on file    Forced sexual activity: Not on file  Other Topics Concern  . Not on file  Social History Narrative  . Not on file   Family History  Problem Relation Age of Onset  . Cancer Father        pacemaker  . Bradycardia Father    Current Outpatient Medications  Medication Sig Dispense Refill  . cycloSPORINE (RESTASIS) 0.05 % ophthalmic emulsion Place 1 drop into both eyes 2 (two) times daily as needed (for eye irritation/dryness).    Marland Kitchen diclofenac sodium (VOLTAREN) 1 % GEL Apply 1 g topically 4 (four) times daily as needed (for knee pain.).    Marland Kitchen ELIQUIS 5 MG TABS tablet TAKE 1 TABLET(5 MG) BY MOUTH TWICE DAILY 60 tablet 3  . fluticasone (FLONASE) 50 MCG/ACT nasal spray Place 2 sprays into both nostrils at bedtime.     Marland Kitchen lactulose (CHRONULAC) 10 GM/15ML solution Take 10 g by mouth daily.     Marland Kitchen levothyroxine (SYNTHROID, LEVOTHROID) 125 MCG tablet Take 125 mcg by mouth daily before breakfast.   0  . nadolol (CORGARD) 20 MG tablet Take 20 mg by mouth daily.     Marland Kitchen NARCAN 4 MG/0.1ML LIQD nasal spray kit Place 1 spray into the nose as needed (For accidental opoid overdose).   0  . omega-3 acid ethyl esters (LOVAZA) 1 g capsule Take 2 g by mouth 2 (two) times daily.   0  . oxyCODONE (OXYCONTIN) 40 mg 12 hr tablet Take 1 tablet (40 mg total) by mouth every 12 (twelve) hours. 60 tablet 0  . Oxycodone HCl 20 MG TABS Take 1 tablet (20 mg total) by  mouth every 8 (eight) hours as needed. 90 tablet 0  . potassium chloride SA  (K-DUR,KLOR-CON) 20 MEQ tablet Take 2 tablets (40 mEq total) by mouth daily.    Marland Kitchen spironolactone (ALDACTONE) 100 MG tablet Take 100 mg by mouth 2 (two) times daily.      Marland Kitchen torsemide (DEMADEX) 20 MG tablet Take 4 tablets (80 mg total) by mouth 2 (two) times daily.     No current facility-administered medications for this encounter.    Vitals:   01/19/19 1043  BP: 112/64  Pulse: 75  SpO2: 92%  Weight: (!) 175.5 kg (387 lb)     Wt Readings from Last 3 Encounters:  01/19/19 (!) 175.5 kg (387 lb)  12/29/18 (!) 176.7 kg (389 lb 8 oz)  11/02/18 (!) 190.4 kg (419 lb 12.8 oz)   Physical Exam General: Morbid obesity. In [place]. No resp difficulty. HEENT: Normal Neck: Supple. JVP 7-8 cm, thick. Carotids 2+ bilat; no bruits. No thyromegaly or nodule noted. Cor: PMI nondisplaced. RRR, No M/G/R noted Lungs: CTAB, normal effort. On 2L via [place].  Abdomen: Soft, non-tender, non-distended, no HSM. No bruits or masses. +BS  Extremities: No cyanosis, clubbing, or rash. 2-3+ chronic edema into thighs, BLE unna boots.  Neuro: Alert & orientedx3, cranial nerves grossly intact. moves all 4 extremities w/o difficulty. Affect pleasant   1. Chronic diastolic CHF with prominent RV failure: Echo most remarkable for pulmonary hypertension and RV failure (11/18). Suspect RV failure is triggered by pulmonary hypertension.  Underlying OHS/OSA and COPD have likely caused pulmonary hypertension/RV failure. NYHA III-IIIb. Severely limited by body habitus. Volume status much improved.  - Continue torsemide 80 mg BID.  - Continue spiro 100 mg BID - Echo 11/02/18: EF 60-65%, RV severely dilated and severley reduced function, RA severely dilated, severe TR, PA peak pressure 89 mm Hg - Continue UNNA boots. Wounds have healed, so no longer following with wound clinic - Discussed limiting fluid and salt intake and avoiding [employer].. No change.  2. Pulmonary hypertension: Severe by echo and RHC (pulmonary arterial  hypertension).  Suspect group 3 disease due to COPD (severe obstruction on PFTs) and OSA/OHS.  V/Q scan not suggestive of chronic PEs.  He is on home oxygen and CPAP.  - Most likely would not benefit significantly from pulmonary vasodilators. No change.  3. OHS/OSA: Continue chronic O2 and nightly CPAP.  No change.  - Will order for condensor at home.  4. Cirrhosis: Suspected NASH versus congestive hepatopathy from RV failure.  Denies ETOH. Cannot totally rule out component of portopulmonary hypertension. He is on spironolactone at high dose.  No change.  5. Atrial fibrillation: Chronic, suspect permanent (present since at least 2015). Rate controlled on nadolol.  - Continue Eliquis 5 mg BID.  Denies bleeding.  6. Super Morbid Obesity: Body mass index is 52.49 kg/m.  - Stressed importance of weight loss.  7. CKD: Stage 3 - BMET today.  - Off etodolac (NSAID).  8. Rash - Resolved with improved hygiene.   BMET today. RTC 6-8 weeks. We have few options for continued volume removal outside of significant weight loss.   Shirley Friar, PA-C  01/19/2019   Greater than 50% of the 25 minute visit was spent in counseling/coordination of care regarding disease state education, salt/fluid restriction, sliding scale diuretics, and medication compliance.

## 2019-01-19 NOTE — Patient Instructions (Addendum)
Routine lab work today. Will notify you of abnormal results  Follow up in 4-6 weeks

## 2019-01-25 ENCOUNTER — Inpatient Hospital Stay: Payer: Medicare Other

## 2019-01-25 ENCOUNTER — Other Ambulatory Visit: Payer: Self-pay

## 2019-01-25 ENCOUNTER — Inpatient Hospital Stay: Payer: Medicare Other | Attending: Hematology & Oncology | Admitting: Hematology & Oncology

## 2019-01-25 VITALS — BP 86/51 | HR 68 | Temp 98.4°F | Resp 22 | Wt 380.5 lb

## 2019-01-25 DIAGNOSIS — G4730 Sleep apnea, unspecified: Secondary | ICD-10-CM | POA: Diagnosis not present

## 2019-01-25 DIAGNOSIS — I509 Heart failure, unspecified: Secondary | ICD-10-CM

## 2019-01-25 DIAGNOSIS — Z9981 Dependence on supplemental oxygen: Secondary | ICD-10-CM | POA: Diagnosis not present

## 2019-01-25 DIAGNOSIS — I5032 Chronic diastolic (congestive) heart failure: Secondary | ICD-10-CM

## 2019-01-25 DIAGNOSIS — Z8572 Personal history of non-Hodgkin lymphomas: Secondary | ICD-10-CM | POA: Insufficient documentation

## 2019-01-25 DIAGNOSIS — Z7901 Long term (current) use of anticoagulants: Secondary | ICD-10-CM | POA: Insufficient documentation

## 2019-01-25 LAB — CBC WITH DIFFERENTIAL (CANCER CENTER ONLY)
ABS IMMATURE GRANULOCYTES: 0.03 10*3/uL (ref 0.00–0.07)
BASOS PCT: 0 %
Basophils Absolute: 0 10*3/uL (ref 0.0–0.1)
Eosinophils Absolute: 0 10*3/uL (ref 0.0–0.5)
Eosinophils Relative: 1 %
HCT: 44.5 % (ref 39.0–52.0)
HEMOGLOBIN: 14.4 g/dL (ref 13.0–17.0)
Immature Granulocytes: 1 %
Lymphocytes Relative: 17 %
Lymphs Abs: 1.1 10*3/uL (ref 0.7–4.0)
MCH: 31.9 pg (ref 26.0–34.0)
MCHC: 32.4 g/dL (ref 30.0–36.0)
MCV: 98.5 fL (ref 80.0–100.0)
Monocytes Absolute: 0.6 10*3/uL (ref 0.1–1.0)
Monocytes Relative: 9 %
NEUTROS ABS: 4.8 10*3/uL (ref 1.7–7.7)
Neutrophils Relative %: 72 %
Platelet Count: 105 10*3/uL — ABNORMAL LOW (ref 150–400)
RBC: 4.52 MIL/uL (ref 4.22–5.81)
RDW: 20.8 % — ABNORMAL HIGH (ref 11.5–15.5)
WBC Count: 6.6 10*3/uL (ref 4.0–10.5)
nRBC: 0 % (ref 0.0–0.2)

## 2019-01-25 LAB — CMP (CANCER CENTER ONLY)
ALT: 46 U/L — ABNORMAL HIGH (ref 0–44)
AST: 33 U/L (ref 15–41)
Albumin: 4.2 g/dL (ref 3.5–5.0)
Alkaline Phosphatase: 95 U/L (ref 38–126)
Anion gap: 10 (ref 5–15)
BUN: 46 mg/dL — ABNORMAL HIGH (ref 8–23)
CO2: 28 mmol/L (ref 22–32)
Calcium: 10 mg/dL (ref 8.9–10.3)
Chloride: 95 mmol/L — ABNORMAL LOW (ref 98–111)
Creatinine: 1.7 mg/dL — ABNORMAL HIGH (ref 0.61–1.24)
GFR, Est AFR Am: 48 mL/min — ABNORMAL LOW (ref >60)
GFR, Estimated: 42 mL/min — ABNORMAL LOW (ref >60)
Glucose, Bld: 111 mg/dL — ABNORMAL HIGH (ref 70–99)
Potassium: 5.4 mmol/L — ABNORMAL HIGH (ref 3.5–5.1)
SODIUM: 133 mmol/L — AB (ref 135–145)
Total Bilirubin: 1.2 mg/dL (ref 0.3–1.2)
Total Protein: 6.5 g/dL (ref 6.5–8.1)

## 2019-01-25 NOTE — Progress Notes (Signed)
Hematology and Oncology Follow Up Visit  Albert James [id number] 1956-06-30 63 y.o. 01/25/2019   Principle Diagnosis:  1. Follicular small cell non-Hodgkin lymphoma, clinical remission. 2. Morbid obesity. 3. Lymphedema of his legs. 4. Vertebral osteomyelitis.   Current Therapy:    Observation     Interim History:  [name] is back for followup.  Surprisingly enough, he was hospitalized back in February.  He had heart failure.  He lost 60 pounds in the hospital.  He was on a Lasix drip.  After being in the hospital, he went to inpatient rehab.  He was there for 2 weeks.  Currently, he is holding his own.  He has chronic oxygen therapy that he uses.  He is on potassium.  His potassium today was 5.4.  I told him to cut back on the potassium to half a dose.  He has had no issues with bleeding.  He is on Eliquis.  There is been no problems with bowels or bladder.  He does have sleep apnea.  I am unsure how often he uses the CPAP machine.  He has had no cough.  He has had no increased shortness of breath.  Overall, his performance status is ECOG 3.   Medications:  Current Outpatient Medications:  .  cycloSPORINE (RESTASIS) 0.05 % ophthalmic emulsion, Place 1 drop into both eyes 2 (two) times daily as needed (for eye irritation/dryness)., Disp: , Rfl:  .  diclofenac sodium (VOLTAREN) 1 % GEL, Apply 1 g topically 4 (four) times daily as needed (for knee pain.)., Disp: , Rfl:  .  ELIQUIS 5 MG TABS tablet, TAKE 1 TABLET(5 MG) BY MOUTH TWICE DAILY, Disp: 60 tablet, Rfl: 3 .  fluticasone (FLONASE) 50 MCG/ACT nasal spray, Place 2 sprays into both nostrils at bedtime. , Disp: , Rfl:  .  lactulose (CHRONULAC) 10 GM/15ML solution, Take 10 g by mouth daily. , Disp: , Rfl:  .  levothyroxine (SYNTHROID, LEVOTHROID) 125 MCG tablet, Take 125 mcg by mouth daily before breakfast. , Disp: , Rfl: 0 .  nadolol (CORGARD) 20 MG tablet, Take 20 mg by mouth daily. , Disp: , Rfl:  .  NARCAN  4 MG/0.1ML LIQD nasal spray kit, Place 1 spray into the nose as needed (For accidental opoid overdose). , Disp: , Rfl: 0 .  omega-3 acid ethyl esters (LOVAZA) 1 g capsule, Take 2 g by mouth 2 (two) times daily. , Disp: , Rfl: 0 .  oxyCODONE (OXYCONTIN) 40 mg 12 hr tablet, Take 1 tablet (40 mg total) by mouth every 12 (twelve) hours., Disp: 60 tablet, Rfl: 0 .  Oxycodone HCl 20 MG TABS, Take 1 tablet (20 mg total) by mouth every 8 (eight) hours as needed., Disp: 90 tablet, Rfl: 0 .  potassium chloride SA (K-DUR,KLOR-CON) 20 MEQ tablet, Take 2 tablets (40 mEq total) by mouth daily., Disp: , Rfl:  .  spironolactone (ALDACTONE) 100 MG tablet, Take 100 mg by mouth 2 (two) times daily.  , Disp: , Rfl:  .  torsemide (DEMADEX) 20 MG tablet, Take 4 tablets (80 mg total) by mouth 2 (two) times daily., Disp: , Rfl:   Allergies:  Allergies  Allergen Reactions  . Vancomycin Other (See Comments)    "red man" syndrome (when in large quantity)    Past Medical History, Surgical history, Social history, and Family History were reviewed and updated.  Review of Systems: Review of Systems  Constitutional: Negative.   HENT: Negative.   Eyes: Negative.  Respiratory: Positive for shortness of breath.   Cardiovascular: Positive for palpitations and leg swelling.  Gastrointestinal: Positive for nausea.  Genitourinary: Negative.   Musculoskeletal: Positive for back pain.  Skin: Negative.   Neurological: Negative.   Endo/Heme/Allergies: Negative.   Psychiatric/Behavioral: Negative.      Physical Exam:  weight is 380 lb 8 oz (172.6 kg) (abnormal). His oral temperature is 98.4 F (36.9 C). His blood pressure is 86/51 (abnormal) and his pulse is 68. His respiration is 22 (abnormal) and oxygen saturation is 94%.   Physical Exam  Lab Results  Component Value Date   WBC 6.6 01/25/2019   HGB 14.4 01/25/2019   HCT 44.5 01/25/2019   MCV 98.5 01/25/2019   PLT 105 (L) 01/25/2019     Chemistry       Component Value Date/Time   NA 133 (L) 01/25/2019 1256   NA 140 07/29/2017 1311   NA 142 01/26/2017 1309   K 5.4 (H) 01/25/2019 1256   K 4.3 07/29/2017 1311   K 4.0 01/26/2017 1309   CL 95 (L) 01/25/2019 1256   CL 102 07/29/2017 1311   CL 100 01/26/2017 1309   CO2 28 01/25/2019 1256   CO2 30 (H) 07/29/2017 1311   CO2 30 01/26/2017 1309   BUN 46 (H) 01/25/2019 1256   BUN 15 07/29/2017 1311   BUN 15 01/26/2017 1309   CREATININE 1.70 (H) 01/25/2019 1256   CREATININE 1.04 07/29/2017 1311   CREATININE 1.1 01/26/2017 1309      Component Value Date/Time   CALCIUM 10.0 01/25/2019 1256   CALCIUM 9.4 07/29/2017 1311   CALCIUM 9.4 01/26/2017 1309   ALKPHOS 95 01/25/2019 1256   ALKPHOS 88 07/29/2017 1311   ALKPHOS 84 01/26/2017 1309   AST 33 01/25/2019 1256   ALT 46 (H) 01/25/2019 1256   ALT 23 01/26/2017 1309   BILITOT 1.2 01/25/2019 1256         Impression and Plan: [name] is 63 year old white male. He has a remote history of follicular small cell non-Hodgkin's lymphoma. This really has not been an issue for him for probably 14 years.  I am glad that he is doing better.  I know that his cardiac status has been somewhat tenuous.  He will clearly succumb to his heart issues long before he has any issues with lymphoma.  I will see him back in 6 months.  Albert Napoleon, MD 3/12/20202:30 PM

## 2019-01-29 DIAGNOSIS — R0902 Hypoxemia: Secondary | ICD-10-CM | POA: Diagnosis not present

## 2019-01-29 DIAGNOSIS — I272 Pulmonary hypertension, unspecified: Secondary | ICD-10-CM | POA: Diagnosis not present

## 2019-01-29 DIAGNOSIS — M2550 Pain in unspecified joint: Secondary | ICD-10-CM | POA: Diagnosis not present

## 2019-01-29 DIAGNOSIS — I5032 Chronic diastolic (congestive) heart failure: Secondary | ICD-10-CM | POA: Diagnosis not present

## 2019-02-01 ENCOUNTER — Encounter (HOSPITAL_COMMUNITY): Payer: Medicare Other

## 2019-02-15 ENCOUNTER — Other Ambulatory Visit: Payer: Self-pay

## 2019-02-15 DIAGNOSIS — Z8572 Personal history of non-Hodgkin lymphomas: Secondary | ICD-10-CM

## 2019-02-15 DIAGNOSIS — G8929 Other chronic pain: Secondary | ICD-10-CM

## 2019-02-15 DIAGNOSIS — M549 Dorsalgia, unspecified: Secondary | ICD-10-CM

## 2019-02-15 MED ORDER — OXYCODONE HCL ER 40 MG PO T12A: 40.0000 mg | EXTENDED_RELEASE_TABLET | Freq: Two times a day (BID) | ORAL | 0 refills | Status: DC

## 2019-02-15 MED ORDER — OXYCODONE HCL 20 MG PO TABS: 20.0000 mg | ORAL_TABLET | Freq: Three times a day (TID) | ORAL | 0 refills | Status: DC | PRN

## 2019-02-19 IMAGING — NM NM PULMONARY VENT & PERF
12 series · 12 of 12 positions shown · non-contrast
Comparison: Chest x-ray November 25, 2017

CLINICAL DATA: Shortness of breath for 2 weeks.

EXAM:
NUCLEAR MEDICINE VENTILATION - PERFUSION LUNG SCAN
TECHNIQUE: Ventilation images were obtained in multiple projections using
inhaled aerosol Tc-88m DTPA. Perfusion images were obtained in
multiple projections after intravenous injection of Tc-88m MAA.
RADIOPHARMACEUTICALS:  31.3 mCi Bechnetium-SSm DTPA aerosol
inhalation and 4.3 mCi Bechnetium-SSm MAA IV

[Series 1: ant/post vent · 4.14mm/px · 1 of 1 slices shown (1 of 2)]
[im 1/1  full-range]
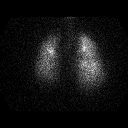

[Series 1: ant/post vent · 4.14mm/px · 1 of 1 slices shown (2 of 2)]
[im 1/1]
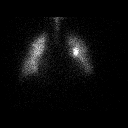

[Series 2: lao/rpo vent · 4.14mm/px · 1 of 1 slices shown (1 of 2)]
[im 1/1  full-range]
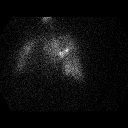

[Series 2: lao/rpo vent · 4.14mm/px · 1 of 1 slices shown (2 of 2)]
[im 1/1  full-range]
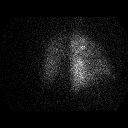

[Series 3: lpo/rao vent · 4.14mm/px · 1 of 1 slices shown (1 of 2)]
[im 1/1  full-range]
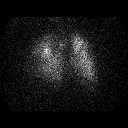

[Series 3: lpo/rao vent · 4.14mm/px · 1 of 1 slices shown (2 of 2)]
[im 1/1  full-range]
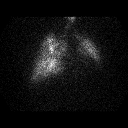

[Series 4: lpo/rao perf · 4.14mm/px · 1 of 1 slices shown (1 of 2)]
[im 1/1]
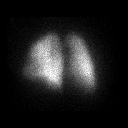

[Series 4: lpo/rao perf · 4.14mm/px · 1 of 1 slices shown (2 of 2)]
[im 1/1]
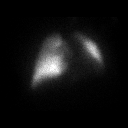

[Series 5: ant/post perf · 4.14mm/px · 1 of 1 slices shown (1 of 2)]
[im 1/1]
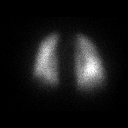

[Series 5: ant/post perf · 4.14mm/px · 1 of 1 slices shown (2 of 2)]
[im 1/1]
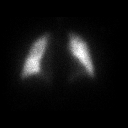

[Series 6: lao/rpo perf · 4.14mm/px · 1 of 1 slices shown (1 of 2)]
[im 1/1]
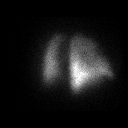

[Series 6: lao/rpo perf · 4.14mm/px · 1 of 1 slices shown (2 of 2)]
[im 1/1]
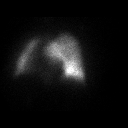

[12 of 12 positions shown; findings below may reference images not displayed]

FINDINGS: Ventilation: No focal ventilation defect.

Perfusion: No wedge shaped peripheral perfusion defects to suggest
acute pulmonary embolism.
IMPRESSION: Very low probability V/Q scan. No significant evidence for pulmonary
embolus.

## 2019-02-20 ENCOUNTER — Ambulatory Visit: Payer: Medicare Other | Admitting: Podiatry

## 2019-02-22 ENCOUNTER — Other Ambulatory Visit (HOSPITAL_COMMUNITY): Payer: Self-pay | Admitting: Cardiology

## 2019-02-22 MED ORDER — TORSEMIDE 20 MG PO TABS: 80.0000 mg | ORAL_TABLET | Freq: Two times a day (BID) | ORAL | 3 refills | Status: DC

## 2019-03-01 DIAGNOSIS — I5032 Chronic diastolic (congestive) heart failure: Secondary | ICD-10-CM | POA: Diagnosis not present

## 2019-03-01 DIAGNOSIS — R0902 Hypoxemia: Secondary | ICD-10-CM | POA: Diagnosis not present

## 2019-03-01 DIAGNOSIS — I272 Pulmonary hypertension, unspecified: Secondary | ICD-10-CM | POA: Diagnosis not present

## 2019-03-01 DIAGNOSIS — M2550 Pain in unspecified joint: Secondary | ICD-10-CM | POA: Diagnosis not present

## 2019-03-06 ENCOUNTER — Other Ambulatory Visit: Payer: Self-pay

## 2019-03-06 ENCOUNTER — Encounter (HOSPITAL_COMMUNITY): Payer: Self-pay

## 2019-03-06 ENCOUNTER — Ambulatory Visit (HOSPITAL_COMMUNITY)
Admission: RE | Admit: 2019-03-06 | Discharge: 2019-03-06 | Disposition: A | Discharge status: Home / Self Care | Payer: Medicare Other | Source: Ambulatory Visit | Attending: Adult Health | Admitting: Adult Health

## 2019-03-06 VITALS — Wt 381.0 lb

## 2019-03-06 DIAGNOSIS — I4819 Other persistent atrial fibrillation: Secondary | ICD-10-CM

## 2019-03-06 DIAGNOSIS — I5032 Chronic diastolic (congestive) heart failure: Secondary | ICD-10-CM | POA: Diagnosis not present

## 2019-03-06 DIAGNOSIS — G4733 Obstructive sleep apnea (adult) (pediatric): Secondary | ICD-10-CM

## 2019-03-06 NOTE — Patient Instructions (Addendum)
Take an extra 20 mg torsemide for weight 386 or greater.   Do the following things EVERYDAY: 1) Weigh yourself in the morning before breakfast. Write it down and keep it in a log. 2) Take your medicines as prescribed 3) Eat low salt foods-Limit salt (sodium) to 2000 mg per day.  4) Stay as active as you can everyday 5) Limit all fluids for the day to less than 2 liters  Recommended follow-up:  Follow up in 2-3 months.

## 2019-03-06 NOTE — Progress Notes (Signed)
Spoke to pt to review after visit summary. Pt verbalized understanding. Pt agreeable to f/u appointment. Denies further needs at this time. Mychart message sent with avs details.

## 2019-03-06 NOTE — Progress Notes (Signed)
Heart Failure TeleHealth Note  Due to national recommendations of social distancing due to [place] 19, Audio/video telehealth visit is felt to be most appropriate for this patient at this time.  See MyChart message from today for patient consent regarding telehealth for [facility].  Date:  03/06/2019   ID:  Albert James, DOB 1955/11/30, MRN [id number]  Location: Home  Provider location: [facility] Type of Visit: Established patient  PCP:  Mayra Neer, MD  Cardiologist:  No primary care provider on file. Primary HF: Dr Aundra Dubin   Chief Complaint: Heart Failure   History of Present Illness: Albert James is a 63 y.o. male with a history of permanent atrial fibrillation, NASH with cirrhosis, suspected OHS/OSA, and chronic diastolic CHF/RV failure was referred by Dr. Martinique for evaluation of CHF.   Patient has had atrial fibrillation since at least 2015. He has had problems with peripheral edema x years. He has also had some degree of dyspnea for a long time.  However, towards the end of 2018, dyspnea became markedly worse.  At last appointment, I started him on home oxygen.   Echo was done in 11/18 showing EF 50-55%, septal flattening, severe RV dilation, PASP 77 mmHg. LHC/RHC in 1/19 showed no CAD, evidence for RV failure and severe pulmonary hypertension.  PFTs in 1/19 showed severe COPD.  V/Q scan in 1/19 showed no evidence for chronic PEs.  Sleep study showed severe OSA, using CPAP.    Admitted 2/7 - 12/30/2018 with generalized weakness and marked volume overload. He diuresed over 50 lbs total.   Echo 10/2018: EF 60-65%, RV severly dilated and severly reduced function  He presents via video conferencing for a telehealth visit today.   Overall feeling fine. Mild dyspnea after walking 1-2 minutes. He wears 2-3 liters oxygen continuously. Denies PND/Orthopnea. Ongoing leg edema.  Appetite ok. Tries to limit fast food.  No fever or chills. Uses  CPAP every night.  Weight at home 380-385 pounds.  pounds. Taking all medications. No bleeding problems. Followed by [facility]. Lives with his daughter.    he denies symptoms worrisome for COVID 19.   Past Medical History:  Diagnosis Date  . Abdominal hernia    can' t  fix per pt, it is large  . Acute cor pulmonale (HCC)   . Anxiety   . Arthritis    lower back  . Atrial fibrillation, persistent 07/08/2014  . Back pain   . CHF (congestive heart failure) ([place])   . Cirrhosis ([place])    hx of   . Depression   . Edema of both legs   . Knee pain both knees   wears compression due to edema  . nhl; dx'd 2006   chemo comp, lymphoma  . Non Hodgkin's lymphoma ([place])    hx of   . Peripheral vascular disease ([place])   . Shoulder pain   . Swelling of lower extremity    both legs, uses compression wraps all the time  . Ulcer    left leg, now healed  . Varicose veins    Past Surgical History:  Procedure Laterality Date  . APPENDECTOMY    . biopsy on back at disc     . COLONOSCOPY WITH PROPOFOL N/A 10/24/2014   Procedure: COLONOSCOPY WITH PROPOFOL;  Surgeon: Lear Ng, MD;  Location: [facility];  Service: Endoscopy;  Laterality: N/A;  . ENDOVENOUS ABLATION SAPHENOUS VEIN W/ LASER Left 06-10-2014   EVLA  Left  greater saphenous vein by Curt Jews MD    . HERNIA REPAIR  bdomen as child umbilical as adult   x 2   . left elbow surgery      pin out then pin removed   . PORTACATH PLACEMENT  2000   placed and removed  . portacath removal     . RIGHT/LEFT HEART CATH AND CORONARY ANGIOGRAPHY N/A 11/21/2017   Procedure: RIGHT/LEFT HEART CATH AND CORONARY ANGIOGRAPHY;  Surgeon: Larey Dresser, MD;  Location: [facility];  Service: Cardiovascular;  Laterality: N/A;  . SHOULDER OPEN ROTATOR CUFF REPAIR Left 07/12/2014   Procedure: LEFT SHOULDER MINI OPEN ROTATOR CUFF REPAIR  SUBACROMIAL DECOMPRESSION ;  Surgeon: Johnn Hai, MD;  Location: [facility];  Service: Orthopedics;  Laterality:  Left;     Current Outpatient Medications  Medication Sig Dispense Refill  . cycloSPORINE (RESTASIS) 0.05 % ophthalmic emulsion Place 1 drop into both eyes 2 (two) times daily as needed (for eye irritation/dryness).    Marland Kitchen diclofenac sodium (VOLTAREN) 1 % GEL Apply 1 g topically 4 (four) times daily as needed (for knee pain.).    Marland Kitchen ELIQUIS 5 MG TABS tablet TAKE 1 TABLET(5 MG) BY MOUTH TWICE DAILY 60 tablet 3  . fluticasone (FLONASE) 50 MCG/ACT nasal spray Place 2 sprays into both nostrils at bedtime.     Marland Kitchen lactulose (CHRONULAC) 10 GM/15ML solution Take 10 g by mouth daily.     Marland Kitchen levothyroxine (SYNTHROID, LEVOTHROID) 125 MCG tablet Take 125 mcg by mouth daily before breakfast.   0  . nadolol (CORGARD) 20 MG tablet Take 20 mg by mouth daily.     Marland Kitchen NARCAN 4 MG/0.1ML LIQD nasal spray kit Place 1 spray into the nose as needed (For accidental opoid overdose).   0  . omega-3 acid ethyl esters (LOVAZA) 1 g capsule Take 2 g by mouth 2 (two) times daily.   0  . oxyCODONE (OXYCONTIN) 40 mg 12 hr tablet Take 1 tablet (40 mg total) by mouth every 12 (twelve) hours. 60 tablet 0  . Oxycodone HCl 20 MG TABS Take 1 tablet (20 mg total) by mouth every 8 (eight) hours as needed. 90 tablet 0  . potassium chloride SA (K-DUR,KLOR-CON) 20 MEQ tablet Take 2 tablets (40 mEq total) by mouth daily.    Marland Kitchen spironolactone (ALDACTONE) 100 MG tablet Take 100 mg by mouth 2 (two) times daily.      Marland Kitchen torsemide (DEMADEX) 20 MG tablet Take 4 tablets (80 mg total) by mouth 2 (two) times daily. 720 tablet 3   No current facility-administered medications for this encounter.     Allergies:   Vancomycin   Social History:  The patient  reports that he quit smoking about 29 years ago. His smoking use included cigarettes. He started smoking about 52 years ago. He has a 4.00 pack-year smoking history. He has never used smokeless tobacco. He reports that he does not drink alcohol or use drugs.   Family History:  The patient's family  history includes Bradycardia in his father; Cancer in his father.   ROS:  Please see the history of present illness.   All other systems are personally reviewed and negative.   Exam:  Video Health Call; Exam is visual. General:  Speaks in full sentences. No resp difficulty. Lungs: Normal respiratory effort with conversation.  On 2 liters continuously Abdomen: Non-distended per patient report Extremities: Legs are wrapped  Neuro: Alert & oriented x 3.   Recent Labs:  12/23/2018: B Natriuretic Peptide 1,221.0 12/30/2018: Magnesium 2.7 01/25/2019: ALT 46; BUN 46; Creatinine 1.70; Hemoglobin 14.4; Platelet Count 105; Potassium 5.4; Sodium 133  Personally reviewed   Wt Readings from Last 3 Encounters:  03/06/19 (!) 172.8 kg (381 lb)  01/25/19 (!) 172.6 kg (380 lb 8 oz)  01/19/19 (!) 175.5 kg (387 lb)      ASSESSMENT AND PLAN:  1. Chronic diastolic CHF with prominent RV failure: Echo most remarkable for pulmonary hypertension and RV failure (11/18). Suspect RV failure is triggered by pulmonary hypertension.  Underlying OHS/OSA and COPD have likely caused pulmonary hypertension/RV failure.  NYHA  III.  - Continue torsemide 80 mg BID. He was instructed to take an extra 20 mg torsemide for weight 386 or greater.   - Continue spiro 100 mg BID - Echo 11/02/18: EF 60-65%, RV severely dilated and severley reduced function, RA severely dilated, severe TR, PA peak pressure 89 mm Hg - Discussed limiting fast food and limiting fluid intake to < 2 liters per day.  2. Pulmonary hypertension: Severe by echo and RHC (pulmonary arterial hypertension).  Suspect group 3 disease due to COPD (severe obstruction on PFTs) and OSA/OHS.  V/Q scan not suggestive of chronic PEs.  Continue home oxygen and CPAP.  - Most likely would not benefit significantly from pulmonary vasodilators. No change.  3. OHS/OSA: Continue chronic O2 and nightly CPAP.  No change.  4. Cirrhosis: Suspected NASH versus congestive hepatopathy  from RV failure.  Denies ETOH. Cannot totally rule out component of portopulmonary hypertension. He is on spironolactone at high dose.  No change.  5. Atrial fibrillation: Chronic, suspect permanent (present since at least 2015).  No bleeding issues.  - Continue Eliquis 5 mg BID.   6. Super Morbid Obesity:Body mass index is 51.67 kg/m. Discussed portion control.     COVID screen The patient does not have any symptoms that suggest any further testing/ screening at this time.  Social distancing reinforced today.  Patient Risk: After full review of this patients clinical status, I feel that they are at moderate risk for cardiac decompensation at this time.  Relevant cardiac medications were reviewed at length with the patient today. The patient does not have concerns regarding their medications at this time.   The following changes were made today:  Take an extra 20 mg torsemide for weight 386 or greater.  Recommended follow-up:  Follow up in 2-3 months.   Today, I have spent 15 minutes with the patient with telehealth technology discussing the above issues .    Jeanmarie Hubert, NP   03/06/2019 10:14 AM  [facility] [address] [facility] [place] [address] [phone] (office) [phone] (fax)

## 2019-03-06 NOTE — Addendum Note (Signed)
Encounter addended by: Marlise Eves, RN on: 03/06/2019 10:59 AM  Actions taken: Clinical Note Signed

## 2019-03-14 DIAGNOSIS — I50810 Right heart failure, unspecified: Secondary | ICD-10-CM | POA: Diagnosis not present

## 2019-03-14 DIAGNOSIS — Z8572 Personal history of non-Hodgkin lymphomas: Secondary | ICD-10-CM | POA: Diagnosis not present

## 2019-03-14 DIAGNOSIS — I890 Lymphedema, not elsewhere classified: Secondary | ICD-10-CM | POA: Diagnosis not present

## 2019-03-14 DIAGNOSIS — E8770 Fluid overload, unspecified: Secondary | ICD-10-CM | POA: Diagnosis not present

## 2019-03-14 DIAGNOSIS — K7469 Other cirrhosis of liver: Secondary | ICD-10-CM | POA: Diagnosis not present

## 2019-03-14 DIAGNOSIS — K7460 Unspecified cirrhosis of liver: Secondary | ICD-10-CM | POA: Diagnosis not present

## 2019-03-14 DIAGNOSIS — I5033 Acute on chronic diastolic (congestive) heart failure: Secondary | ICD-10-CM | POA: Diagnosis not present

## 2019-03-14 DIAGNOSIS — Z9981 Dependence on supplemental oxygen: Secondary | ICD-10-CM | POA: Diagnosis not present

## 2019-03-14 DIAGNOSIS — I2609 Other pulmonary embolism with acute cor pulmonale: Secondary | ICD-10-CM | POA: Diagnosis not present

## 2019-03-14 DIAGNOSIS — I5020 Unspecified systolic (congestive) heart failure: Secondary | ICD-10-CM | POA: Diagnosis not present

## 2019-03-14 DIAGNOSIS — I739 Peripheral vascular disease, unspecified: Secondary | ICD-10-CM | POA: Diagnosis not present

## 2019-03-14 DIAGNOSIS — Z7901 Long term (current) use of anticoagulants: Secondary | ICD-10-CM | POA: Diagnosis not present

## 2019-03-14 DIAGNOSIS — Z6841 Body Mass Index (BMI) 40.0 and over, adult: Secondary | ICD-10-CM | POA: Diagnosis not present

## 2019-03-14 DIAGNOSIS — I272 Pulmonary hypertension, unspecified: Secondary | ICD-10-CM | POA: Diagnosis not present

## 2019-03-14 DIAGNOSIS — I872 Venous insufficiency (chronic) (peripheral): Secondary | ICD-10-CM | POA: Diagnosis not present

## 2019-03-14 DIAGNOSIS — I2781 Cor pulmonale (chronic): Secondary | ICD-10-CM | POA: Diagnosis not present

## 2019-03-14 DIAGNOSIS — I482 Chronic atrial fibrillation, unspecified: Secondary | ICD-10-CM | POA: Diagnosis not present

## 2019-03-14 DIAGNOSIS — N183 Chronic kidney disease, stage 3 (moderate): Secondary | ICD-10-CM | POA: Diagnosis not present

## 2019-03-16 ENCOUNTER — Other Ambulatory Visit: Payer: Self-pay | Admitting: *Deleted

## 2019-03-16 DIAGNOSIS — Z8572 Personal history of non-Hodgkin lymphomas: Secondary | ICD-10-CM

## 2019-03-16 DIAGNOSIS — M549 Dorsalgia, unspecified: Secondary | ICD-10-CM

## 2019-03-16 DIAGNOSIS — G8929 Other chronic pain: Secondary | ICD-10-CM

## 2019-03-16 MED ORDER — OXYCODONE HCL 20 MG PO TABS: 20.0000 mg | ORAL_TABLET | Freq: Three times a day (TID) | ORAL | 0 refills | Status: DC | PRN

## 2019-03-16 MED ORDER — OXYCODONE HCL ER 40 MG PO T12A: 40.0000 mg | EXTENDED_RELEASE_TABLET | Freq: Two times a day (BID) | ORAL | 0 refills | Status: DC

## 2019-03-18 DIAGNOSIS — I50810 Right heart failure, unspecified: Secondary | ICD-10-CM | POA: Diagnosis not present

## 2019-03-18 DIAGNOSIS — I5033 Acute on chronic diastolic (congestive) heart failure: Secondary | ICD-10-CM | POA: Diagnosis not present

## 2019-03-18 DIAGNOSIS — N183 Chronic kidney disease, stage 3 (moderate): Secondary | ICD-10-CM | POA: Diagnosis not present

## 2019-03-18 DIAGNOSIS — I5020 Unspecified systolic (congestive) heart failure: Secondary | ICD-10-CM | POA: Diagnosis not present

## 2019-03-21 DIAGNOSIS — I272 Pulmonary hypertension, unspecified: Secondary | ICD-10-CM | POA: Diagnosis not present

## 2019-03-21 DIAGNOSIS — I50810 Right heart failure, unspecified: Secondary | ICD-10-CM | POA: Diagnosis not present

## 2019-03-21 DIAGNOSIS — N183 Chronic kidney disease, stage 3 (moderate): Secondary | ICD-10-CM | POA: Diagnosis not present

## 2019-03-21 DIAGNOSIS — I5033 Acute on chronic diastolic (congestive) heart failure: Secondary | ICD-10-CM | POA: Diagnosis not present

## 2019-03-21 DIAGNOSIS — K7581 Nonalcoholic steatohepatitis (NASH): Secondary | ICD-10-CM | POA: Diagnosis not present

## 2019-03-21 DIAGNOSIS — I2609 Other pulmonary embolism with acute cor pulmonale: Secondary | ICD-10-CM | POA: Diagnosis not present

## 2019-03-21 DIAGNOSIS — I739 Peripheral vascular disease, unspecified: Secondary | ICD-10-CM | POA: Diagnosis not present

## 2019-03-21 DIAGNOSIS — I5020 Unspecified systolic (congestive) heart failure: Secondary | ICD-10-CM | POA: Diagnosis not present

## 2019-03-21 DIAGNOSIS — I482 Chronic atrial fibrillation, unspecified: Secondary | ICD-10-CM | POA: Diagnosis not present

## 2019-03-21 DIAGNOSIS — I872 Venous insufficiency (chronic) (peripheral): Secondary | ICD-10-CM | POA: Diagnosis not present

## 2019-03-22 DIAGNOSIS — M25561 Pain in right knee: Secondary | ICD-10-CM | POA: Diagnosis not present

## 2019-03-22 DIAGNOSIS — M1711 Unilateral primary osteoarthritis, right knee: Secondary | ICD-10-CM | POA: Diagnosis not present

## 2019-03-31 DIAGNOSIS — M2550 Pain in unspecified joint: Secondary | ICD-10-CM | POA: Diagnosis not present

## 2019-03-31 DIAGNOSIS — I5032 Chronic diastolic (congestive) heart failure: Secondary | ICD-10-CM | POA: Diagnosis not present

## 2019-03-31 DIAGNOSIS — I272 Pulmonary hypertension, unspecified: Secondary | ICD-10-CM | POA: Diagnosis not present

## 2019-03-31 DIAGNOSIS — R0902 Hypoxemia: Secondary | ICD-10-CM | POA: Diagnosis not present

## 2019-04-05 DIAGNOSIS — Z0000 Encounter for general adult medical examination without abnormal findings: Secondary | ICD-10-CM | POA: Diagnosis not present

## 2019-04-05 DIAGNOSIS — C8590 Non-Hodgkin lymphoma, unspecified, unspecified site: Secondary | ICD-10-CM | POA: Diagnosis not present

## 2019-04-05 DIAGNOSIS — I2781 Cor pulmonale (chronic): Secondary | ICD-10-CM | POA: Diagnosis not present

## 2019-04-05 DIAGNOSIS — K7460 Unspecified cirrhosis of liver: Secondary | ICD-10-CM | POA: Diagnosis not present

## 2019-04-12 DIAGNOSIS — K7460 Unspecified cirrhosis of liver: Secondary | ICD-10-CM | POA: Diagnosis not present

## 2019-04-12 DIAGNOSIS — C8590 Non-Hodgkin lymphoma, unspecified, unspecified site: Secondary | ICD-10-CM | POA: Diagnosis not present

## 2019-04-12 DIAGNOSIS — N183 Chronic kidney disease, stage 3 (moderate): Secondary | ICD-10-CM | POA: Diagnosis not present

## 2019-04-12 DIAGNOSIS — E039 Hypothyroidism, unspecified: Secondary | ICD-10-CM | POA: Diagnosis not present

## 2019-04-16 ENCOUNTER — Other Ambulatory Visit: Payer: Self-pay | Admitting: *Deleted

## 2019-04-16 DIAGNOSIS — G8929 Other chronic pain: Secondary | ICD-10-CM

## 2019-04-16 DIAGNOSIS — Z8572 Personal history of non-Hodgkin lymphomas: Secondary | ICD-10-CM

## 2019-04-16 MED ORDER — OXYCODONE HCL ER 40 MG PO T12A: 40.0000 mg | EXTENDED_RELEASE_TABLET | Freq: Two times a day (BID) | ORAL | 0 refills | Status: DC

## 2019-04-16 MED ORDER — OXYCODONE HCL 20 MG PO TABS: 20.0000 mg | ORAL_TABLET | Freq: Three times a day (TID) | ORAL | 0 refills | Status: DC | PRN

## 2019-04-20 DIAGNOSIS — N179 Acute kidney failure, unspecified: Secondary | ICD-10-CM | POA: Diagnosis not present

## 2019-04-24 ENCOUNTER — Telehealth (HOSPITAL_COMMUNITY): Payer: Self-pay | Admitting: *Deleted

## 2019-04-24 DIAGNOSIS — I5032 Chronic diastolic (congestive) heart failure: Secondary | ICD-10-CM

## 2019-04-24 MED ORDER — TORSEMIDE 20 MG PO TABS: ORAL_TABLET | ORAL | 3 refills | Status: DC

## 2019-04-24 NOTE — Telephone Encounter (Signed)
Pt called earlier today to c/o wt gain of 10 lbs over past week, he states he is more SOB, has LE edema and abd distention.  He states he saw his PCP a couple of weeks ago and they decreased his Torsemide from 80 mg BID to 80 mg DAILY due to his kidney function.  Called Dr Raul Del office and had them fax over labs, labs were done 5/28:  BUN 84 Cr 2.56 K 5.0  Discussed all w/Amy Ninfa Meeker, NP she advised pt to increase Torsemide to 80 mg BID for 2 days then decrease to 80 mg in AM and 40 mg in PM, labs in 1 week.  Spoke w/pt, he is aware, agreeable and verbalizes understanding, lab appt sch for 6/16 he will call us back later this wk if wt not decreasing and symptoms improving

## 2019-04-27 ENCOUNTER — Other Ambulatory Visit (HOSPITAL_COMMUNITY): Payer: Self-pay | Admitting: Cardiology

## 2019-04-27 ENCOUNTER — Other Ambulatory Visit: Payer: Self-pay | Admitting: Gastroenterology

## 2019-04-27 DIAGNOSIS — K7469 Other cirrhosis of liver: Secondary | ICD-10-CM

## 2019-05-01 ENCOUNTER — Encounter (HOSPITAL_COMMUNITY): Payer: Self-pay

## 2019-05-01 ENCOUNTER — Other Ambulatory Visit (HOSPITAL_COMMUNITY): Payer: Self-pay

## 2019-05-01 ENCOUNTER — Other Ambulatory Visit: Payer: Self-pay

## 2019-05-01 ENCOUNTER — Ambulatory Visit (HOSPITAL_COMMUNITY)
Admission: RE | Admit: 2019-05-01 | Discharge: 2019-05-01 | Disposition: A | Discharge status: Home / Self Care | Payer: Medicare Other | Source: Ambulatory Visit | Attending: Internal Medicine | Admitting: Internal Medicine

## 2019-05-01 DIAGNOSIS — M2550 Pain in unspecified joint: Secondary | ICD-10-CM | POA: Diagnosis not present

## 2019-05-01 DIAGNOSIS — I5032 Chronic diastolic (congestive) heart failure: Secondary | ICD-10-CM | POA: Insufficient documentation

## 2019-05-01 DIAGNOSIS — R0902 Hypoxemia: Secondary | ICD-10-CM | POA: Diagnosis not present

## 2019-05-01 DIAGNOSIS — I272 Pulmonary hypertension, unspecified: Secondary | ICD-10-CM | POA: Diagnosis not present

## 2019-05-01 LAB — BASIC METABOLIC PANEL
Anion gap: 11 (ref 5–15)
BUN: 45 mg/dL — ABNORMAL HIGH (ref 8–23)
CO2: 26 mmol/L (ref 22–32)
Calcium: 9.3 mg/dL (ref 8.9–10.3)
Chloride: 94 mmol/L — ABNORMAL LOW (ref 98–111)
Creatinine, Ser: 2.28 mg/dL — ABNORMAL HIGH (ref 0.61–1.24)
GFR calc Af Amer: 34 mL/min — ABNORMAL LOW (ref >60)
GFR calc non Af Amer: 29 mL/min — ABNORMAL LOW (ref >60)
Glucose, Bld: 108 mg/dL — ABNORMAL HIGH (ref 70–99)
Potassium: 4.8 mmol/L (ref 3.5–5.1)
Sodium: 131 mmol/L — ABNORMAL LOW (ref 135–145)

## 2019-05-07 ENCOUNTER — Telehealth (HOSPITAL_COMMUNITY): Payer: Self-pay

## 2019-05-07 NOTE — Telephone Encounter (Signed)

## 2019-05-08 ENCOUNTER — Other Ambulatory Visit: Payer: Self-pay

## 2019-05-08 ENCOUNTER — Ambulatory Visit (HOSPITAL_COMMUNITY)
Admission: RE | Admit: 2019-05-08 | Discharge: 2019-05-08 | Disposition: A | Discharge status: Home / Self Care | Payer: Medicare Other | Source: Ambulatory Visit | Attending: Cardiology | Admitting: Cardiology

## 2019-05-08 DIAGNOSIS — I5032 Chronic diastolic (congestive) heart failure: Secondary | ICD-10-CM | POA: Diagnosis not present

## 2019-05-08 LAB — BASIC METABOLIC PANEL
Anion gap: 10 (ref 5–15)
BUN: 44 mg/dL — ABNORMAL HIGH (ref 8–23)
CO2: 24 mmol/L (ref 22–32)
Calcium: 8.9 mg/dL (ref 8.9–10.3)
Chloride: 101 mmol/L (ref 98–111)
Creatinine, Ser: 1.85 mg/dL — ABNORMAL HIGH (ref 0.61–1.24)
GFR calc Af Amer: 43 mL/min — ABNORMAL LOW (ref >60)
GFR calc non Af Amer: 37 mL/min — ABNORMAL LOW (ref >60)
Glucose, Bld: 124 mg/dL — ABNORMAL HIGH (ref 70–99)
Potassium: 4.1 mmol/L (ref 3.5–5.1)
Sodium: 135 mmol/L (ref 135–145)

## 2019-05-09 ENCOUNTER — Other Ambulatory Visit (HOSPITAL_COMMUNITY): Payer: Self-pay | Admitting: Cardiology

## 2019-05-15 ENCOUNTER — Other Ambulatory Visit: Payer: Self-pay | Admitting: *Deleted

## 2019-05-15 ENCOUNTER — Ambulatory Visit
Admission: RE | Admit: 2019-05-15 | Discharge: 2019-05-15 | Disposition: A | Discharge status: Home / Self Care | Payer: Medicare Other | Source: Ambulatory Visit | Attending: Gastroenterology | Admitting: Gastroenterology

## 2019-05-15 DIAGNOSIS — K7469 Other cirrhosis of liver: Secondary | ICD-10-CM

## 2019-05-15 DIAGNOSIS — N281 Cyst of kidney, acquired: Secondary | ICD-10-CM | POA: Diagnosis not present

## 2019-05-15 DIAGNOSIS — G8929 Other chronic pain: Secondary | ICD-10-CM

## 2019-05-15 DIAGNOSIS — Z8572 Personal history of non-Hodgkin lymphomas: Secondary | ICD-10-CM

## 2019-05-16 MED ORDER — OXYCODONE HCL ER 40 MG PO T12A: 40.0000 mg | EXTENDED_RELEASE_TABLET | Freq: Two times a day (BID) | ORAL | 0 refills | Status: DC

## 2019-05-16 MED ORDER — OXYCODONE HCL 20 MG PO TABS: 20.0000 mg | ORAL_TABLET | Freq: Three times a day (TID) | ORAL | 0 refills | Status: DC | PRN

## 2019-05-17 ENCOUNTER — Other Ambulatory Visit: Payer: Self-pay | Admitting: *Deleted

## 2019-05-21 ENCOUNTER — Telehealth: Payer: Self-pay

## 2019-05-21 NOTE — Telephone Encounter (Signed)
Received VM from [place] with [facility] reporting that pt's Oxycontin 79m has been approved from 05/17/2019 - 05/16/2020. dph

## 2019-05-22 ENCOUNTER — Telehealth (HOSPITAL_COMMUNITY): Payer: Self-pay | Admitting: *Deleted

## 2019-05-22 MED ORDER — TORSEMIDE 20 MG PO TABS: 100.0000 mg | ORAL_TABLET | Freq: Two times a day (BID) | ORAL | Status: DC

## 2019-05-22 NOTE — Telephone Encounter (Signed)
Pt left VM stating his weight ws up to 430lbs, lots of swelling in both legs, and shortness of breath. Patient stated he is currently taking Torsemide 43m bid. Per Amy increase to 1038mbid until office visit in 7/21. Pt advised to call our office if this doesn't help. Pt verbalized understanding.

## 2019-05-25 ENCOUNTER — Other Ambulatory Visit: Payer: Self-pay

## 2019-05-25 ENCOUNTER — Emergency Department (HOSPITAL_COMMUNITY): Payer: Medicare Other

## 2019-05-25 ENCOUNTER — Inpatient Hospital Stay (HOSPITAL_COMMUNITY)
Admission: EM | Admit: 2019-05-25 | Discharge: 2019-06-09 | DRG: 291 | Disposition: A | Discharge status: Home Health | Payer: Medicare Other | Attending: Family Medicine | Admitting: Family Medicine

## 2019-05-25 ENCOUNTER — Encounter (HOSPITAL_COMMUNITY): Payer: Self-pay | Admitting: *Deleted

## 2019-05-25 DIAGNOSIS — C8590 Non-Hodgkin lymphoma, unspecified, unspecified site: Secondary | ICD-10-CM | POA: Diagnosis not present

## 2019-05-25 DIAGNOSIS — T502X5A Adverse effect of carbonic-anhydrase inhibitors, benzothiadiazides and other diuretics, initial encounter: Secondary | ICD-10-CM | POA: Diagnosis not present

## 2019-05-25 DIAGNOSIS — I361 Nonrheumatic tricuspid (valve) insufficiency: Secondary | ICD-10-CM | POA: Diagnosis present

## 2019-05-25 DIAGNOSIS — K469 Unspecified abdominal hernia without obstruction or gangrene: Secondary | ICD-10-CM | POA: Diagnosis not present

## 2019-05-25 DIAGNOSIS — M2550 Pain in unspecified joint: Secondary | ICD-10-CM | POA: Diagnosis not present

## 2019-05-25 DIAGNOSIS — Z87891 Personal history of nicotine dependence: Secondary | ICD-10-CM

## 2019-05-25 DIAGNOSIS — I371 Nonrheumatic pulmonary valve insufficiency: Secondary | ICD-10-CM | POA: Diagnosis not present

## 2019-05-25 DIAGNOSIS — Z713 Dietary counseling and surveillance: Secondary | ICD-10-CM

## 2019-05-25 DIAGNOSIS — G8929 Other chronic pain: Secondary | ICD-10-CM | POA: Diagnosis present

## 2019-05-25 DIAGNOSIS — I5082 Biventricular heart failure: Secondary | ICD-10-CM | POA: Diagnosis not present

## 2019-05-25 DIAGNOSIS — J9621 Acute and chronic respiratory failure with hypoxia: Secondary | ICD-10-CM | POA: Diagnosis not present

## 2019-05-25 DIAGNOSIS — Z22322 Carrier or suspected carrier of Methicillin resistant Staphylococcus aureus: Secondary | ICD-10-CM

## 2019-05-25 DIAGNOSIS — I4821 Permanent atrial fibrillation: Secondary | ICD-10-CM | POA: Diagnosis not present

## 2019-05-25 DIAGNOSIS — E662 Morbid (severe) obesity with alveolar hypoventilation: Secondary | ICD-10-CM | POA: Diagnosis present

## 2019-05-25 DIAGNOSIS — N179 Acute kidney failure, unspecified: Secondary | ICD-10-CM | POA: Diagnosis present

## 2019-05-25 DIAGNOSIS — Z6841 Body Mass Index (BMI) 40.0 and over, adult: Secondary | ICD-10-CM | POA: Diagnosis not present

## 2019-05-25 DIAGNOSIS — F419 Anxiety disorder, unspecified: Secondary | ICD-10-CM | POA: Diagnosis not present

## 2019-05-25 DIAGNOSIS — N183 Chronic kidney disease, stage 3 (moderate): Secondary | ICD-10-CM | POA: Diagnosis not present

## 2019-05-25 DIAGNOSIS — I872 Venous insufficiency (chronic) (peripheral): Secondary | ICD-10-CM | POA: Diagnosis present

## 2019-05-25 DIAGNOSIS — M549 Dorsalgia, unspecified: Secondary | ICD-10-CM | POA: Diagnosis not present

## 2019-05-25 DIAGNOSIS — I739 Peripheral vascular disease, unspecified: Secondary | ICD-10-CM | POA: Diagnosis present

## 2019-05-25 DIAGNOSIS — D696 Thrombocytopenia, unspecified: Secondary | ICD-10-CM | POA: Diagnosis present

## 2019-05-25 DIAGNOSIS — I272 Pulmonary hypertension, unspecified: Secondary | ICD-10-CM | POA: Diagnosis not present

## 2019-05-25 DIAGNOSIS — F329 Major depressive disorder, single episode, unspecified: Secondary | ICD-10-CM | POA: Diagnosis present

## 2019-05-25 DIAGNOSIS — I50811 Acute right heart failure: Secondary | ICD-10-CM

## 2019-05-25 DIAGNOSIS — E871 Hypo-osmolality and hyponatremia: Secondary | ICD-10-CM | POA: Diagnosis not present

## 2019-05-25 DIAGNOSIS — I5032 Chronic diastolic (congestive) heart failure: Secondary | ICD-10-CM | POA: Diagnosis not present

## 2019-05-25 DIAGNOSIS — J9601 Acute respiratory failure with hypoxia: Secondary | ICD-10-CM | POA: Diagnosis not present

## 2019-05-25 DIAGNOSIS — Z20828 Contact with and (suspected) exposure to other viral communicable diseases: Secondary | ICD-10-CM | POA: Diagnosis present

## 2019-05-25 DIAGNOSIS — Z7951 Long term (current) use of inhaled steroids: Secondary | ICD-10-CM

## 2019-05-25 DIAGNOSIS — Z7901 Long term (current) use of anticoagulants: Secondary | ICD-10-CM

## 2019-05-25 DIAGNOSIS — R0602 Shortness of breath: Secondary | ICD-10-CM | POA: Diagnosis not present

## 2019-05-25 DIAGNOSIS — I482 Chronic atrial fibrillation, unspecified: Secondary | ICD-10-CM | POA: Diagnosis present

## 2019-05-25 DIAGNOSIS — I5033 Acute on chronic diastolic (congestive) heart failure: Secondary | ICD-10-CM | POA: Diagnosis not present

## 2019-05-25 DIAGNOSIS — R001 Bradycardia, unspecified: Secondary | ICD-10-CM | POA: Diagnosis not present

## 2019-05-25 DIAGNOSIS — Z9989 Dependence on other enabling machines and devices: Secondary | ICD-10-CM | POA: Diagnosis not present

## 2019-05-25 DIAGNOSIS — Z452 Encounter for adjustment and management of vascular access device: Secondary | ICD-10-CM

## 2019-05-25 DIAGNOSIS — E039 Hypothyroidism, unspecified: Secondary | ICD-10-CM

## 2019-05-25 DIAGNOSIS — J449 Chronic obstructive pulmonary disease, unspecified: Secondary | ICD-10-CM | POA: Diagnosis not present

## 2019-05-25 DIAGNOSIS — J9611 Chronic respiratory failure with hypoxia: Secondary | ICD-10-CM | POA: Diagnosis not present

## 2019-05-25 DIAGNOSIS — Z79899 Other long term (current) drug therapy: Secondary | ICD-10-CM

## 2019-05-25 DIAGNOSIS — K7460 Unspecified cirrhosis of liver: Secondary | ICD-10-CM | POA: Diagnosis not present

## 2019-05-25 DIAGNOSIS — E876 Hypokalemia: Secondary | ICD-10-CM | POA: Diagnosis not present

## 2019-05-25 DIAGNOSIS — G4733 Obstructive sleep apnea (adult) (pediatric): Secondary | ICD-10-CM

## 2019-05-25 DIAGNOSIS — R0902 Hypoxemia: Secondary | ICD-10-CM | POA: Diagnosis not present

## 2019-05-25 DIAGNOSIS — Z881 Allergy status to other antibiotic agents status: Secondary | ICD-10-CM

## 2019-05-25 DIAGNOSIS — Z79890 Hormone replacement therapy (postmenopausal): Secondary | ICD-10-CM

## 2019-05-25 DIAGNOSIS — I2721 Secondary pulmonary arterial hypertension: Secondary | ICD-10-CM | POA: Diagnosis present

## 2019-05-25 HISTORY — DX: Nonalcoholic steatohepatitis (NASH): K75.81

## 2019-05-25 HISTORY — DX: Morbid (severe) obesity due to excess calories: E66.01

## 2019-05-25 HISTORY — DX: Chronic obstructive pulmonary disease, unspecified: J44.9

## 2019-05-25 HISTORY — DX: Morbid (severe) obesity with alveolar hypoventilation: E66.2

## 2019-05-25 HISTORY — DX: Chronic respiratory failure, unspecified whether with hypoxia or hypercapnia: J96.10

## 2019-05-25 HISTORY — DX: Chronic diastolic (congestive) heart failure: I50.32

## 2019-05-25 HISTORY — DX: Obstructive sleep apnea (adult) (pediatric): G47.33

## 2019-05-25 HISTORY — DX: Permanent atrial fibrillation: I48.21

## 2019-05-25 HISTORY — DX: Pulmonary hypertension, unspecified: I27.20

## 2019-05-25 HISTORY — DX: Chronic kidney disease, stage 3 unspecified: N18.30

## 2019-05-25 HISTORY — DX: Cor pulmonale (chronic): I27.81

## 2019-05-25 LAB — CBC
HCT: 40.7 % (ref 39.0–52.0)
Hemoglobin: 12.6 g/dL — ABNORMAL LOW (ref 13.0–17.0)
MCH: 33.2 pg (ref 26.0–34.0)
MCHC: 31 g/dL (ref 30.0–36.0)
MCV: 107.1 fL — ABNORMAL HIGH (ref 80.0–100.0)
Platelets: 114 10*3/uL — ABNORMAL LOW (ref 150–400)
RBC: 3.8 MIL/uL — ABNORMAL LOW (ref 4.22–5.81)
RDW: 20.1 % — ABNORMAL HIGH (ref 11.5–15.5)
WBC: 7.1 10*3/uL (ref 4.0–10.5)
nRBC: 0.3 % — ABNORMAL HIGH (ref 0.0–0.2)

## 2019-05-25 LAB — HEPATIC FUNCTION PANEL
ALT: 26 U/L (ref 0–44)
AST: 26 U/L (ref 15–41)
Albumin: 2.9 g/dL — ABNORMAL LOW (ref 3.5–5.0)
Alkaline Phosphatase: 69 U/L (ref 38–126)
Bilirubin, Direct: 0.4 mg/dL — ABNORMAL HIGH (ref 0.0–0.2)
Indirect Bilirubin: 0.6 mg/dL (ref 0.3–0.9)
Total Bilirubin: 1 mg/dL (ref 0.3–1.2)
Total Protein: 5.5 g/dL — ABNORMAL LOW (ref 6.5–8.1)

## 2019-05-25 LAB — BASIC METABOLIC PANEL
Anion gap: 12 (ref 5–15)
BUN: 35 mg/dL — ABNORMAL HIGH (ref 8–23)
CO2: 21 mmol/L — ABNORMAL LOW (ref 22–32)
Calcium: 8.5 mg/dL — ABNORMAL LOW (ref 8.9–10.3)
Chloride: 102 mmol/L (ref 98–111)
Creatinine, Ser: 1.8 mg/dL — ABNORMAL HIGH (ref 0.61–1.24)
GFR calc Af Amer: 45 mL/min — ABNORMAL LOW (ref >60)
GFR calc non Af Amer: 39 mL/min — ABNORMAL LOW (ref >60)
Glucose, Bld: 112 mg/dL — ABNORMAL HIGH (ref 70–99)
Potassium: 3.9 mmol/L (ref 3.5–5.1)
Sodium: 135 mmol/L (ref 135–145)

## 2019-05-25 LAB — SARS CORONAVIRUS 2 BY RT PCR (HOSPITAL ORDER, PERFORMED IN ~~LOC~~ HOSPITAL LAB): SARS Coronavirus 2: NEGATIVE

## 2019-05-25 LAB — BRAIN NATRIURETIC PEPTIDE: B Natriuretic Peptide: 740.5 pg/mL — ABNORMAL HIGH (ref 0.0–100.0)

## 2019-05-25 LAB — TROPONIN I (HIGH SENSITIVITY): Troponin I (High Sensitivity): 11 ng/L (ref <18)

## 2019-05-25 MED ORDER — SODIUM CHLORIDE 0.9% FLUSH: 3.0000 mL | Freq: Once | INTRAVENOUS | Status: DC

## 2019-05-25 MED ORDER — LEVOTHYROXINE SODIUM 25 MCG PO TABS
125.0000 ug | ORAL_TABLET | Freq: Every day | ORAL | Status: DC
  Administered 2019-05-26 – 2019-06-09 (×15): 125 ug via ORAL
  Filled 2019-05-25 (×15): qty 1

## 2019-05-25 MED ORDER — LACTULOSE 10 GM/15ML PO SOLN
10.0000 g | Freq: Every day | ORAL | Status: DC
  Administered 2019-05-26 – 2019-05-28 (×3): 10 g via ORAL
  Filled 2019-05-25 (×4): qty 15

## 2019-05-25 MED ORDER — ALBUTEROL SULFATE (2.5 MG/3ML) 0.083% IN NEBU: 2.5000 mg | INHALATION_SOLUTION | RESPIRATORY_TRACT | Status: DC | PRN

## 2019-05-25 MED ORDER — FLUTICASONE PROPIONATE 50 MCG/ACT NA SUSP
2.0000 | Freq: Every day | NASAL | Status: DC
  Administered 2019-05-25 – 2019-06-08 (×15): 2 via NASAL
  Filled 2019-05-25: qty 16

## 2019-05-25 MED ORDER — FUROSEMIDE 10 MG/ML IJ SOLN
60.0000 mg | Freq: Two times a day (BID) | INTRAMUSCULAR | Status: DC
  Administered 2019-05-26 – 2019-05-28 (×5): 60 mg via INTRAVENOUS
  Filled 2019-05-25 (×6): qty 6

## 2019-05-25 MED ORDER — SODIUM CHLORIDE 0.9% FLUSH
3.0000 mL | Freq: Two times a day (BID) | INTRAVENOUS | Status: DC
  Administered 2019-05-25 – 2019-05-31 (×8): 3 mL via INTRAVENOUS

## 2019-05-25 MED ORDER — FUROSEMIDE 10 MG/ML IJ SOLN
60.0000 mg | Freq: Once | INTRAMUSCULAR | Status: AC
  Administered 2019-05-25: 60 mg via INTRAVENOUS
  Filled 2019-05-25: qty 6

## 2019-05-25 MED ORDER — ACETAMINOPHEN 650 MG RE SUPP: 650.0000 mg | Freq: Four times a day (QID) | RECTAL | Status: DC | PRN

## 2019-05-25 MED ORDER — OXYCODONE HCL 5 MG PO TABS
20.0000 mg | ORAL_TABLET | Freq: Three times a day (TID) | ORAL | Status: DC | PRN
  Administered 2019-05-26 – 2019-06-08 (×7): 20 mg via ORAL
  Filled 2019-05-25 (×7): qty 4

## 2019-05-25 MED ORDER — OXYCODONE HCL ER 20 MG PO T12A
40.0000 mg | EXTENDED_RELEASE_TABLET | Freq: Two times a day (BID) | ORAL | Status: DC
  Administered 2019-05-26 – 2019-06-09 (×29): 40 mg via ORAL
  Filled 2019-05-25 (×29): qty 2

## 2019-05-25 MED ORDER — APIXABAN 5 MG PO TABS
5.0000 mg | ORAL_TABLET | Freq: Two times a day (BID) | ORAL | Status: DC
  Administered 2019-05-25 – 2019-06-09 (×30): 5 mg via ORAL
  Filled 2019-05-25 (×30): qty 1

## 2019-05-25 MED ORDER — ACETAMINOPHEN 325 MG PO TABS
650.0000 mg | ORAL_TABLET | Freq: Four times a day (QID) | ORAL | Status: DC | PRN
  Filled 2019-05-25 (×3): qty 2

## 2019-05-25 MED ORDER — POTASSIUM CHLORIDE CRYS ER 20 MEQ PO TBCR
20.0000 meq | EXTENDED_RELEASE_TABLET | Freq: Every day | ORAL | Status: DC
  Administered 2019-05-26 – 2019-05-28 (×3): 20 meq via ORAL
  Filled 2019-05-25 (×3): qty 1

## 2019-05-25 NOTE — Progress Notes (Signed)
Pt assessed. Pt doesn't seem in distress at this time. PRN Bipap order if needed.

## 2019-05-25 NOTE — ED Provider Notes (Signed)
[facility] EMERGENCY DEPARTMENT Provider Note   CSN: [id number] Arrival date & time: 05/25/19  1543    History   Chief Complaint Chief Complaint  Patient presents with   Leg Swelling   Shortness of Breath    HPI Albert James is a 63 y.o. male.     The history is provided by the patient.  Shortness of Breath Severity:  Moderate Onset quality:  Gradual Timing:  Constant Progression:  Worsening Chronicity:  Recurrent Context: activity   Context comment:  HF hx, increased fluid, increasing O2 need at home.  Relieved by:  Diuretics Worsened by:  Nothing Associated symptoms: no abdominal pain, no chest pain, no cough, no ear pain, no fever, no rash, no sore throat, no sputum production and no vomiting   Risk factors comment:  Hx of afib, CHF, liver failure   Past Medical History:  Diagnosis Date   Abdominal hernia    can' t  fix per pt, it is large   Acute cor pulmonale (HCC)    Anxiety    Arthritis    lower back   Atrial fibrillation, persistent 07/08/2014   Back pain    CHF (congestive heart failure) (HCC)    Cirrhosis (HCC)    hx of    Depression    Edema of both legs    Knee pain both knees   wears compression due to edema   nhl; dx'd 2006   chemo comp, lymphoma   Non Hodgkin's lymphoma ([place])    hx of    Peripheral vascular disease (HCC)    Shoulder pain    Swelling of lower extremity    both legs, uses compression wraps all the time   Ulcer    left leg, now healed   Varicose veins     Patient Active Problem List   Diagnosis Date Noted   Acute on chronic diastolic (congestive) heart failure ([place]) 05/25/2019   Cirrhosis ([place])    Pulmonary hypertension ([place])    Acute cor pulmonale ([place]) 12/25/2018   Pressure injury of skin 12/25/2018   CHF exacerbation ([place]) 12/23/2018   Chronic anticoagulation    Chronic atrial fibrillation    Hyponatremia    Hypotension    Chronic diastolic heart  failure ([place]) 12/20/2017   Special screening for malignant neoplasms, colon 10/24/2014   Complete rotator cuff tear of left shoulder 07/12/2014   Complete rotator cuff tear 07/12/2014   Rotator cuff dysfunction 07/08/2014   Atrial fibrillation, persistent 07/08/2014   Varicose veins of lower extremities with other complications 92/33/0076   Venous reflux 03/27/2014   Ulcer of lower limb ([place]) 03/27/2014   Chronic venous insufficiency 03/27/2014   Personal history of lymphoma 04/17/2013   Chronic back pain 04/17/2013   Acquired lymphedema of leg 04/17/2013   Osteomyelitis of vertebra ([place]) 04/17/2013    Past Surgical History:  Procedure Laterality Date   APPENDECTOMY     biopsy on back at disc      COLONOSCOPY WITH PROPOFOL N/A 10/24/2014   Procedure: COLONOSCOPY WITH PROPOFOL;  Surgeon: Lear Ng, MD;  Location: [facility];  Service: Endoscopy;  Laterality: N/A;   ENDOVENOUS ABLATION SAPHENOUS VEIN W/ LASER Left 06-10-2014   EVLA  Left greater saphenous vein by Curt Jews MD     HERNIA REPAIR  bdomen as child umbilical as adult   x 2    left elbow surgery      pin out then pin removed    PORTACATH  PLACEMENT  2000   placed and removed   portacath removal      RIGHT/LEFT HEART CATH AND CORONARY ANGIOGRAPHY N/A 11/21/2017   Procedure: RIGHT/LEFT HEART CATH AND CORONARY ANGIOGRAPHY;  Surgeon: Larey Dresser, MD;  Location: [facility];  Service: Cardiovascular;  Laterality: N/A;   SHOULDER OPEN ROTATOR CUFF REPAIR Left 07/12/2014   Procedure: LEFT SHOULDER MINI OPEN ROTATOR CUFF REPAIR  SUBACROMIAL DECOMPRESSION ;  Surgeon: Johnn Hai, MD;  Location: [facility];  Service: Orthopedics;  Laterality: Left;        Home Medications    Prior to Admission medications   Medication Sig Start Date End Date Taking? Authorizing Provider  cycloSPORINE (RESTASIS) 0.05 % ophthalmic emulsion Place 1 drop into both eyes 2 (two) times daily as needed  (for eye irritation/dryness).    [provider]  diclofenac sodium (VOLTAREN) 1 % GEL Apply 1 g topically 4 (four) times daily as needed (for knee pain.).    [provider]  ELIQUIS 5 MG TABS tablet TAKE 1 TABLET(5 MG) BY MOUTH TWICE DAILY 05/09/19   Larey Dresser, MD  fluticasone [facility]) 50 MCG/ACT nasal spray Place 2 sprays into both nostrils at bedtime.  04/24/12   [provider]  lactulose (CHRONULAC) 10 GM/15ML solution Take 10 g by mouth daily.  11/09/11   [provider]  levothyroxine (SYNTHROID, LEVOTHROID) 125 MCG tablet Take 125 mcg by mouth daily before breakfast.  06/01/17   [provider]  nadolol (CORGARD) 20 MG tablet Take 20 mg by mouth daily.     [provider]  NARCAN 4 MG/0.1ML LIQD nasal spray kit Place 1 spray into the nose as needed (For accidental opoid overdose).  09/05/17   [provider]  omega-3 acid ethyl esters (LOVAZA) 1 g capsule Take 2 g by mouth 2 (two) times daily.  06/01/17   [provider]  oxyCODONE (OXYCONTIN) 40 mg 12 hr tablet Take 1 tablet (40 mg total) by mouth every 12 (twelve) hours. 05/16/19   Volanda Napoleon, MD  Oxycodone HCl 20 MG TABS Take 1 tablet (20 mg total) by mouth every 8 (eight) hours as needed. 05/16/19   Volanda Napoleon, MD  spironolactone (ALDACTONE) 100 MG tablet Take 100 mg by mouth 2 (two) times daily.      [provider]  torsemide (DEMADEX) 20 MG tablet Take 5 tablets (100 mg total) by mouth 2 (two) times daily. 05/22/19   Larey Dresser, MD    Family History Family History  Problem Relation Age of Onset   Cancer Father        pacemaker   Bradycardia Father     Social History Social History   Tobacco Use   Smoking status: Former Smoker    Packs/day: 0.50    Years: 8.00    Pack years: 4.00    Types: Cigarettes    Start date: 09/26/1966    Quit date: 06/26/1989    Years since quitting: 29.9   Smokeless tobacco: Never Used    Tobacco comment: quit 38 years ago  Substance Use Topics   Alcohol use: No    Alcohol/week: 0.0 standard drinks   Drug use: No     Allergies   Vancomycin   Review of Systems Review of Systems  Constitutional: Negative for chills and fever.  HENT: Negative for ear pain and sore throat.   Eyes: Negative for pain and visual disturbance.  Respiratory: Positive for shortness of  breath. Negative for cough and sputum production.   Cardiovascular: Positive for leg swelling. Negative for chest pain and palpitations.  Gastrointestinal: Negative for abdominal pain and vomiting.  Genitourinary: Negative for dysuria and hematuria.  Musculoskeletal: Negative for arthralgias and back pain.  Skin: Negative for color change and rash.  Neurological: Negative for seizures and syncope.  All other systems reviewed and are negative.    Physical Exam Updated Vital Signs  ED Triage Vitals  Enc Vitals Group     BP 05/25/19 1547 111/86     Pulse Rate 05/25/19 1547 65     Resp 05/25/19 1547 18     Temp 05/25/19 1547 98.7 F (37.1 C)     Temp Source 05/25/19 1547 Oral     SpO2 05/25/19 1547 92 %     Weight 05/25/19 1755 (!) 446 lb (202.3 kg)     Height 05/25/19 1755 5' 11.5" (1.816 m)     Head Circumference --      Peak Flow --      Pain Score 05/25/19 1634 7     Pain Loc --      Pain Edu? --      Excl. in [place]? --     Physical Exam Vitals signs and nursing note reviewed.  Constitutional:      Appearance: He is well-developed.  HENT:     Head: Normocephalic and atraumatic.  Eyes:     Conjunctiva/sclera: Conjunctivae normal.     Pupils: Pupils are equal, round, and reactive to light.  Neck:     Musculoskeletal: Normal range of motion and neck supple.  Cardiovascular:     Rate and Rhythm: Normal rate. Rhythm irregular.     Pulses: Normal pulses.     Heart sounds: Normal heart sounds. No murmur.  Pulmonary:     Effort: Tachypnea present. No respiratory distress.     Breath sounds:  Decreased breath sounds, rhonchi and rales present.     Comments: On 5L of 02 Abdominal:     Palpations: Abdomen is soft.     Tenderness: There is no abdominal tenderness.  Musculoskeletal:     Right lower leg: Edema present.     Left lower leg: Edema present.  Skin:    General: Skin is warm and dry.  Neurological:     Mental Status: He is alert.      ED Treatments / Results  Labs (all labs ordered are listed, but only abnormal results are displayed) Labs Reviewed  BASIC METABOLIC PANEL - Abnormal; Notable for the following components:      Result Value   CO2 21 (*)    Glucose, Bld 112 (*)    BUN 35 (*)    Creatinine, Ser 1.80 (*)    Calcium 8.5 (*)    GFR calc non Af Amer 39 (*)    GFR calc Af Amer 45 (*)    All other components within normal limits  CBC - Abnormal; Notable for the following components:   RBC 3.80 (*)    Hemoglobin 12.6 (*)    MCV 107.1 (*)    RDW 20.1 (*)    Platelets 114 (*)    nRBC 0.3 (*)    All other components within normal limits  BRAIN NATRIURETIC PEPTIDE - Abnormal; Notable for the following components:   B Natriuretic Peptide 740.5 (*)    All other components within normal limits  HEPATIC FUNCTION PANEL - Abnormal; Notable for the following components:   Total Protein  5.5 (*)    Albumin 2.9 (*)    Bilirubin, Direct 0.4 (*)    All other components within normal limits  SARS CORONAVIRUS 2 (HOSPITAL ORDER, [place] [facility])  TROPONIN I (HIGH SENSITIVITY)    EKG EKG Interpretation  Date/Time:  Friday May 25 2019 16:16:20 EDT Ventricular Rate:  68 PR Interval:    QRS Duration: 110 QT Interval:  430 QTC Calculation: 457 R Axis:   18 Text Interpretation:  Atrial fibrillation Low voltage QRS Nonspecific ST and T wave abnormality Abnormal ECG Confirmed by Lennice Sites [phone]) on 05/25/2019 5:20:08 PM   Radiology Dg Chest 2 View  Result Date: 05/25/2019 CLINICAL DATA:  Shortness of breath EXAM: CHEST - 2 VIEW  COMPARISON:  12/23/2018 FINDINGS: Enlargement of cardiac silhouette with pulmonary vascular congestion. Mediastinal contours unchanged. Minimal interstitial edema. No pleural effusion or pneumothorax. Bones demineralized. IMPRESSION: Enlargement of cardiac silhouette with pulmonary vascular congestion and minimal pulmonary edema. Electronically Signed   By: Lavonia Dana M.D.   On: 05/25/2019 17:11    Procedures .Critical Care Performed by: Lennice Sites, DO Authorized by: Lennice Sites, DO   Critical care provider statement:    Critical care time (minutes):  35   Critical care was necessary to treat or prevent imminent or life-threatening deterioration of the following conditions:  Respiratory failure   Critical care was time spent personally by me on the following activities:  Blood draw for specimens, development of treatment plan with patient or surrogate, discussions with primary provider, evaluation of patient's response to treatment, examination of patient, obtaining history from patient or surrogate, ordering and review of laboratory studies, ordering and performing treatments and interventions, ordering and review of radiographic studies, pulse oximetry and review of old charts   I assumed direction of critical care for this patient from another provider in my specialty: no     (including critical care time)  Medications Ordered in ED Medications  sodium chloride flush (NS) 0.9 % injection 3 mL (has no administration in time range)  furosemide (LASIX) injection 60 mg (60 mg Intravenous Given 05/25/19 1929)     Initial Impression / Assessment and Plan / ED Course  I have reviewed the triage vital signs and the nursing notes.  Pertinent labs & imaging results that were available during my care of the patient were reviewed by me and considered in my medical decision making (see chart for details).        Albert James is a 63 year old male with history of cirrhosis, heart  failure who presents to the ED with shortness of breath, leg swelling.  Patient with overall unremarkable vitals.  Blood pressure soft in the 90s but patient states that is about normal for him.  Patient has been using increasing doses of Lasix at home up to 100 mg twice a day but he still has put on about 60 pounds in the last month.  Patient states that he is making good urine.  Has a history of liver failure as well.  Has not needed often paracentesis.  Does not have any abdominal tenderness.  Patient difficult to assess due to habitus.  Has significant edema in both legs.  He has both legs wrapped.  He is chronically on 3 L of oxygen which she has increased to 5 L.  Patient denies any chest pain.  Overall suspect heart failure exacerbation in the setting of also liver disease.  Patient is on blood thinner and doubt PE.  EKG shows sinus rhythm.  No ischemic changes.  Troponin normal doubt ACS.  BNP is elevated.  Chest x-ray shows signs of volume overload.  Otherwise no significant electrolyte abnormality, kidney injury.  Creatinine appears to be at baseline.  Overall suspect patient likely with heart failure exacerbation causing acute on chronic respiratory failure.  Will treat with IV Lasix here in the ED.  Patient to be admitted to medicine for further care.  This chart was dictated using voice recognition software.  Despite best efforts to proofread,  errors can occur which can change the documentation meaning.    Final Clinical Impressions(s) / ED Diagnoses   Final diagnoses:  Acute respiratory failure with hypoxia ([place])  Acute right-sided heart failure [facility])    ED Discharge Orders    None       Lennice Sites, DO 05/25/19 1943

## 2019-05-25 NOTE — ED Notes (Addendum)
Pt daughter Burman Nieves [phone]

## 2019-05-25 NOTE — H&P (Signed)
History and Physical    BABY STAIRS AYT:[id number] DOB: 01-29-1956 DOA: 05/25/2019  PCP: Mayra Neer, MD  Patient coming from: Home  I have personally briefly reviewed patient's old medical records in [place]  Chief Complaint: Shortness of breath, weight gain  HPI: Albert James is a 63 y.o. male with medical history significant for chronic diastolic CHF EF 35-57%, severe RV dysfunction, severe pulmonary hypertension, COPD, chronic respiratory failure with hypoxia usually on 2 L O2 via [place], atrial fibrillation on Eliquis, cirrhosis, leukocytopenia, CKD stage III, history of follicular small cell non-Hodgkin's lymphoma in remission, hypothyroidism, and OSA/OHS on CPAP who presents to the ED for evaluation of worsening shortness of breath and weight gain.  Patient states he was doing well after hospitalization in February 2020 at which time he was diuresed 50 pounds.  He says his PCP was concerned about renal function and his home torsemide 80 mg twice daily was decreased to once daily about 1.5 months ago.  He has been having progressive weight gain, dyspnea at rest and with exertion, orthopnea, and worsening swelling of his lower extremities and abdomen since then.  His cardiologist advised him to resume torsemide 80 mg twice daily.  His symptoms persisted and on 05/22/2019 he was advised to increase his torsemide to 100 mg twice daily.  Due to progressive and worsening symptoms he presented to the ED for further evaluation.  He currently denies any chest pain, palpitations, dysuria, fevers, chills, diaphoresis.  ED Course:  Initial vitals showed BP 111/86, pulse 65, RR 18, temp 98.7 Fahrenheit, SPO2 92% on 4 L supplemental O2 via [place].  Labs are notable for sodium 135, potassium 3.9, bicarb 21, BUN 35, creatinine 1.8, WBC 7.1, hemoglobin 12.6, platelets 114,000, BNP 740.5, high-sensitivity troponin I 11, Albumin 2.9.  SARS-CoV-2 test was obtained and pending.  Chest  x-ray showed cardiomegaly with pulmonary vascular congestion.  Patient was given IV Lasix 60 mg x 1 and the hospitalist service was consulted to admit for further evaluation and management.  Review of Systems: All systems reviewed and are negative except as documented in history of present illness above.   Past Medical History:  Diagnosis Date  . Abdominal hernia    can' t  fix per pt, it is large  . Acute cor pulmonale (HCC)   . Anxiety   . Arthritis    lower back  . Atrial fibrillation, persistent 07/08/2014  . Back pain   . CHF (congestive heart failure) ([place])   . Cirrhosis ([place])    hx of   . Depression   . Edema of both legs   . Knee pain both knees   wears compression due to edema  . nhl; dx'd 2006   chemo comp, lymphoma  . Non Hodgkin's lymphoma ([place])    hx of   . Peripheral vascular disease ([place])   . Shoulder pain   . Swelling of lower extremity    both legs, uses compression wraps all the time  . Ulcer    left leg, now healed  . Varicose veins     Past Surgical History:  Procedure Laterality Date  . APPENDECTOMY    . biopsy on back at disc     . COLONOSCOPY WITH PROPOFOL N/A 10/24/2014   Procedure: COLONOSCOPY WITH PROPOFOL;  Surgeon: Lear Ng, MD;  Location: [facility];  Service: Endoscopy;  Laterality: N/A;  . ENDOVENOUS ABLATION SAPHENOUS VEIN W/ LASER Left 06-10-2014   EVLA  Left greater saphenous  vein by Curt Jews MD    . HERNIA REPAIR  bdomen as child umbilical as adult   x 2   . left elbow surgery      pin out then pin removed   . PORTACATH PLACEMENT  2000   placed and removed  . portacath removal     . RIGHT/LEFT HEART CATH AND CORONARY ANGIOGRAPHY N/A 11/21/2017   Procedure: RIGHT/LEFT HEART CATH AND CORONARY ANGIOGRAPHY;  Surgeon: Larey Dresser, MD;  Location: [facility];  Service: Cardiovascular;  Laterality: N/A;  . SHOULDER OPEN ROTATOR CUFF REPAIR Left 07/12/2014   Procedure: LEFT SHOULDER MINI OPEN ROTATOR CUFF REPAIR   SUBACROMIAL DECOMPRESSION ;  Surgeon: Johnn Hai, MD;  Location: [facility];  Service: Orthopedics;  Laterality: Left;    Social History:  reports that he quit smoking about 29 years ago. His smoking use included cigarettes. He started smoking about 52 years ago. He has a 4.00 pack-year smoking history. He has never used smokeless tobacco. He reports that he does not drink alcohol or use drugs.  Allergies  Allergen Reactions  . Vancomycin Other (See Comments)    "red man" syndrome (when in large quantity)    Family History  Problem Relation Age of Onset  . Cancer Father        pacemaker  . Bradycardia Father      Prior to Admission medications   Medication Sig Start Date End Date Taking? Authorizing Provider  cycloSPORINE (RESTASIS) 0.05 % ophthalmic emulsion Place 1 drop into both eyes 2 (two) times daily as needed (for eye irritation/dryness).    [provider]  diclofenac sodium (VOLTAREN) 1 % GEL Apply 1 g topically 4 (four) times daily as needed (for knee pain.).    [provider]  ELIQUIS 5 MG TABS tablet TAKE 1 TABLET(5 MG) BY MOUTH TWICE DAILY 05/09/19   Larey Dresser, MD  fluticasone [facility]) 50 MCG/ACT nasal spray Place 2 sprays into both nostrils at bedtime.  04/24/12   [provider]  lactulose (CHRONULAC) 10 GM/15ML solution Take 10 g by mouth daily.  11/09/11   [provider]  levothyroxine (SYNTHROID, LEVOTHROID) 125 MCG tablet Take 125 mcg by mouth daily before breakfast.  06/01/17   [provider]  nadolol (CORGARD) 20 MG tablet Take 20 mg by mouth daily.     [provider]  NARCAN 4 MG/0.1ML LIQD nasal spray kit Place 1 spray into the nose as needed (For accidental opoid overdose).  09/05/17   [provider]  omega-3 acid ethyl esters (LOVAZA) 1 g capsule Take 2 g by mouth 2 (two) times daily.  06/01/17   [provider]  oxyCODONE (OXYCONTIN) 40 mg 12 hr tablet Take 1 tablet (40 mg total)  by mouth every 12 (twelve) hours. 05/16/19   Volanda Napoleon, MD  Oxycodone HCl 20 MG TABS Take 1 tablet (20 mg total) by mouth every 8 (eight) hours as needed. 05/16/19   Volanda Napoleon, MD  spironolactone (ALDACTONE) 100 MG tablet Take 100 mg by mouth 2 (two) times daily.      [provider]  torsemide (DEMADEX) 20 MG tablet Take 5 tablets (100 mg total) by mouth 2 (two) times daily. 05/22/19   Larey Dresser, MD    Physical Exam: Vitals:   05/25/19 1915 05/25/19 1926 05/25/19 1927 05/25/19 1930  BP: 92/66 129/83 129/83 113/88  Pulse:   65 60  Resp: (!) 23 (!) 21  19  Temp:      TempSrc:      SpO2:   95% 95%  Weight:      Height:        Constitutional: Morbidly obese man resting supine in bed with head slightly elevated, NAD, calm, comfortable Eyes: PERRL, lids and conjunctivae normal ENMT: Mucous membranes are moist. Posterior pharynx clear of any exudate or lesions.Normal dentition.  Neck: normal, supple, no masses. Respiratory: Distant breath sounds without discernible wheezing or crackles, slight increased respiratory effort. No accessory muscle use.  Cardiovascular: Mildly irregular, bradycardic, massive bilateral lower extremity edema.  Abdomen: Obese abdomen no tenderness, no masses palpated. No hepatosplenomegaly. Bowel sounds distant.  Musculoskeletal: Bilateral lower extremity lymphedema, no clubbing / cyanosis. No joint deformity upper and lower extremities. Good ROM, no contractures. Normal muscle tone.  Skin: no rashes, lesions, ulcers. No induration Neurologic: CN 2-12 grossly intact. Sensation intact,Strength 5/5 in all 4.  Psychiatric: Normal judgment and insight. Alert and oriented x 3. Normal mood.     Labs on Admission: I have personally reviewed following labs and imaging studies  CBC: Recent Labs  Lab 05/25/19 1638  WBC 7.1  HGB 12.6*  HCT 40.7  MCV 107.1*  PLT 956*   Basic Metabolic Panel: Recent Labs  Lab 05/25/19 1638  NA 135  K 3.9   CL 102  CO2 21*  GLUCOSE 112*  BUN 35*  CREATININE 1.80*  CALCIUM 8.5*   GFR: Estimated Creatinine Clearance: 73.4 mL/min (A) (by C-G formula based on SCr of 1.8 mg/dL (H)). Liver Function Tests: Recent Labs  Lab 05/25/19 1720  AST 26  ALT 26  ALKPHOS 69  BILITOT 1.0  PROT 5.5*  ALBUMIN 2.9*   No results for input(s): LIPASE, AMYLASE in the last 168 hours. No results for input(s): AMMONIA in the last 168 hours. Coagulation Profile: No results for input(s): INR, PROTIME in the last 168 hours. Cardiac Enzymes: No results for input(s): CKTOTAL, CKMB, CKMBINDEX, TROPONINI in the last 168 hours. BNP (last 3 results) No results for input(s): PROBNP in the last 8760 hours. HbA1C: No results for input(s): HGBA1C in the last 72 hours. CBG: No results for input(s): GLUCAP in the last 168 hours. Lipid Profile: No results for input(s): CHOL, HDL, LDLCALC, TRIG, CHOLHDL, LDLDIRECT in the last 72 hours. Thyroid Function Tests: No results for input(s): TSH, T4TOTAL, FREET4, T3FREE, THYROIDAB in the last 72 hours. Anemia Panel: No results for input(s): VITAMINB12, FOLATE, FERRITIN, TIBC, IRON, RETICCTPCT in the last 72 hours. Urine analysis:    Component Value Date/Time   COLORURINE YELLOW 12/23/2018 0030   APPEARANCEUR CLEAR 12/23/2018 0030   LABSPEC 1.009 12/23/2018 0030   LABSPEC 1.015 01/26/2017 1415   PHURINE 5.0 12/23/2018 0030   GLUCOSEU NEGATIVE 12/23/2018 0030   HGBUR SMALL (A) 12/23/2018 0030   BILIRUBINUR NEGATIVE 12/23/2018 0030   KETONESUR NEGATIVE 12/23/2018 0030   PROTEINUR 100 (A) 12/23/2018 0030   UROBILINOGEN 0.2 01/26/2017 1415   NITRITE NEGATIVE 12/23/2018 0030   LEUKOCYTESUR NEGATIVE 12/23/2018 0030    Radiological Exams on Admission: Dg Chest 2 View  Result Date: 05/25/2019 CLINICAL DATA:  Shortness of breath EXAM: CHEST - 2 VIEW COMPARISON:  12/23/2018 FINDINGS: Enlargement of cardiac silhouette with pulmonary vascular congestion. Mediastinal  contours unchanged. Minimal interstitial edema. No pleural effusion or pneumothorax. Bones demineralized. IMPRESSION: Enlargement of cardiac silhouette with pulmonary vascular congestion and minimal pulmonary edema. Electronically Signed   By: Lavonia Dana M.D.   On: 05/25/2019 17:11  EKG: Independently reviewed. Atrial fibrillation, rate 68, low voltage.  Assessment/Plan Principal Problem:   Acute on chronic diastolic (congestive) heart failure (HCC) Active Problems:   Chronic atrial fibrillation   Cirrhosis ([place])   Pulmonary hypertension (HCC)   Thrombocytopenia (HCC)   OSA on CPAP   COPD (chronic obstructive pulmonary disease) (HCC)   Chronic respiratory failure with hypoxia (HCC)  Albert James is a 63 y.o. male with medical history significant for chronic diastolic CHF EF 10-31%, severe RV dysfunction, severe pulmonary hypertension, COPD, chronic respiratory failure with hypoxia usually on 2 L O2 via [place], atrial fibrillation on Eliquis, cirrhosis, leukocytopenia, CKD stage III, history of follicular small cell non-Hodgkin's lymphoma in remission, hypothyroidism, and OSA/OHS on CPAP is admitted with acute on chronic diastolic CHF exacerbation.   Acute on chronic diastolic CHF exacerbation/severe pulmonary hypertension: Documented 65 pound weight gain since 03/06/2019.  Will need careful monitoring with diuresis given history of severe pulmonary hypertension and RV dysfunction. -Continue IV Lasix 60 mg twice daily -Admit to PCU -Use BiPAP as needed -Obtain echocardiogram -Monitor strict I/O's and daily weights -Monitor renal function and electrolytes -Likely needs Heart Failure team consultation in a.m.  Atrial fibrillation: Remains in atrial fibrillation with rate in the 50s. -Continue Eliquis -Holding nadolol with bradycardia and soft blood pressures  COPD/chronic respiratory failure with hypoxia: -Continue supplemental oxygen as needed, BiPAP ordered as needed  -Continue albuterol as needed  Hepatic cirrhosis: Suspected NASH versus congestive hepatopathy from RV failure. -Continue Lasix as above -Continue lactulose -Previously on spironolactone, unclear if DC'd versus fell off med list.  Consider resuming as BP and renal function tolerates.  CKD stage III: Currently stable, need to monitor closely with diuresis.  Thrombocytopenia: Currently stable without active bleeding.  Likely secondary to liver cirrhosis. -Monitor closely for signs/symptoms of bleeding while on Eliquis  Hypothyroidism: -Continue Synthroid -Check TSH  OSA/OHS: Likely etiology of severe pulmonary hypertension.  Uses CPAP at night. -Continue CPAP nightly  Chronic pain: On OxyContin 40 mg every 12 hours and oxycodone 20 mg as needed every 8 hours as an outpatient.  [place] controlled substance database is reviewed, last prescriptions written 05/16/2019. -Continue home meds as needed with hold parameters  DVT prophylaxis: Eliquis Code Status: Full code, confirmed with patient Family Communication: None present on admission Disposition Plan: Pending clinical progress Consults called: None Admission status: Inpatient, patient likely requires greater than 2 midnight length stay for management of acute diastolic CHF exacerbation as he is high risk for decompensation given comorbidities of severe pulmonary hypertension, tach cirrhosis, CKD stage III, thrombocytopenia, atrial fibrillation, and COPD.   Zada Finders MD [facility]  If 7PM-7AM, please contact night-coverage [email or web address]  05/25/2019, 9:09 PM

## 2019-05-25 NOTE — Plan of Care (Signed)
Patient just arrived so goals of care are just starting,

## 2019-05-25 NOTE — ED Triage Notes (Signed)
Pt reports hx of CHF, COPD, and a-fib.Pt has significant swelling to bilateral legs. Pt reports multiple falls due to difficulty walking. Pt reports having shortness of breath. Pt denies CP.

## 2019-05-26 ENCOUNTER — Inpatient Hospital Stay (HOSPITAL_COMMUNITY): Payer: Medicare Other

## 2019-05-26 DIAGNOSIS — I371 Nonrheumatic pulmonary valve insufficiency: Secondary | ICD-10-CM

## 2019-05-26 DIAGNOSIS — J9611 Chronic respiratory failure with hypoxia: Secondary | ICD-10-CM

## 2019-05-26 DIAGNOSIS — I361 Nonrheumatic tricuspid (valve) insufficiency: Secondary | ICD-10-CM

## 2019-05-26 LAB — BASIC METABOLIC PANEL
Anion gap: 10 (ref 5–15)
BUN: 32 mg/dL — ABNORMAL HIGH (ref 8–23)
CO2: 27 mmol/L (ref 22–32)
Calcium: 8.6 mg/dL — ABNORMAL LOW (ref 8.9–10.3)
Chloride: 103 mmol/L (ref 98–111)
Creatinine, Ser: 1.72 mg/dL — ABNORMAL HIGH (ref 0.61–1.24)
GFR calc Af Amer: 47 mL/min — ABNORMAL LOW (ref >60)
GFR calc non Af Amer: 41 mL/min — ABNORMAL LOW (ref >60)
Glucose, Bld: 92 mg/dL (ref 70–99)
Potassium: 3.4 mmol/L — ABNORMAL LOW (ref 3.5–5.1)
Sodium: 140 mmol/L (ref 135–145)

## 2019-05-26 LAB — CBC
HCT: 38.9 % — ABNORMAL LOW (ref 39.0–52.0)
Hemoglobin: 12.3 g/dL — ABNORMAL LOW (ref 13.0–17.0)
MCH: 33.1 pg (ref 26.0–34.0)
MCHC: 31.6 g/dL (ref 30.0–36.0)
MCV: 104.6 fL — ABNORMAL HIGH (ref 80.0–100.0)
Platelets: 111 10*3/uL — ABNORMAL LOW (ref 150–400)
RBC: 3.72 MIL/uL — ABNORMAL LOW (ref 4.22–5.81)
RDW: 20 % — ABNORMAL HIGH (ref 11.5–15.5)
WBC: 7.6 10*3/uL (ref 4.0–10.5)
nRBC: 0 % (ref 0.0–0.2)

## 2019-05-26 LAB — MRSA PCR SCREENING: MRSA by PCR: POSITIVE — AB

## 2019-05-26 LAB — ECHOCARDIOGRAM COMPLETE
Height: 71 in
Weight: 7030.03 oz

## 2019-05-26 LAB — TSH: TSH: 2.298 u[IU]/mL (ref 0.350–4.500)

## 2019-05-26 LAB — MAGNESIUM: Magnesium: 2.4 mg/dL (ref 1.7–2.4)

## 2019-05-26 MED ORDER — MUPIROCIN 2 % EX OINT
1.0000 "application " | TOPICAL_OINTMENT | Freq: Two times a day (BID) | CUTANEOUS | Status: AC
  Administered 2019-05-26 – 2019-05-30 (×10): 1 via NASAL
  Filled 2019-05-26: qty 22

## 2019-05-26 MED ORDER — POTASSIUM CHLORIDE CRYS ER 20 MEQ PO TBCR
40.0000 meq | EXTENDED_RELEASE_TABLET | Freq: Once | ORAL | Status: AC
  Administered 2019-05-26: 40 meq via ORAL

## 2019-05-26 MED ORDER — CHLORHEXIDINE GLUCONATE CLOTH 2 % EX PADS
6.0000 | MEDICATED_PAD | Freq: Every day | CUTANEOUS | Status: AC
  Administered 2019-05-26 – 2019-05-29 (×4): 6 via TOPICAL

## 2019-05-26 NOTE — Progress Notes (Signed)
  Echocardiogram 2D Echocardiogram has been performed.  Marybelle Killings 05/26/2019, 9:05 AM

## 2019-05-26 NOTE — Plan of Care (Signed)
Patient was asking when he would be able to see his cardiologist. Will follow up and see if someone will see this patient.

## 2019-05-26 NOTE — Progress Notes (Signed)
PROGRESS NOTE    Albert James  CHY:[id number] DOB: 05-12-1956 DOA: 05/25/2019 PCP: Mayra Neer, MD  Outpatient Specialists:   Brief Narrative:  Patient is a 63 year old Caucasian male, morbidly obese, with past medical history significant for chronic diastolic CHF, EF 87-86%, severe RV dysfunction and enlargement, severe pulmonary hypertension with PA peak pressure of 89 mmHg as per last echo,  severe right atrium enlargement and tricuspid regurgitation, COPD, chronic respiratory failure with hypoxia usually on 2 L O2 via [place], atrial fibrillation on Eliquis, cirrhosis, leukocytopenia, CKD stage III, history of follicular small cell non-Hodgkin's lymphoma in remission, hypothyroidism, and OSA/OHS on CPAP who presents to the ED for evaluation of worsening shortness of breath and weight gain.  05/26/2019: Patient seen alongside patient's nurse.  According to the patient, he has had progressive weight gain over the last 1 month.  Patient reports that he is primary care provider and cardiologist may have altered his diuretics.  Patient is currently on IV Lasix 60 mg twice daily.  Mild improvement in renal function is noted.  Respiratory symptoms may be improving slightly.  No fever or chills, no chest pain, no other constitutional symptoms reported.  Assessment & Plan:   Principal Problem:   Acute on chronic diastolic (congestive) heart failure (HCC) Active Problems:   Chronic atrial fibrillation   Cirrhosis ([place])   Pulmonary hypertension (HCC)   Thrombocytopenia (HCC)   OSA on CPAP   COPD (chronic obstructive pulmonary disease) (HCC)   Chronic respiratory failure with hypoxia (HCC)   Acute on chronic diastolic CHF exacerbation/severe pulmonary hypertension: Documented 65 pound weight gain since 03/06/2019.  Will need careful monitoring with diuresis given history of severe pulmonary hypertension and RV dysfunction. -Continue IV Lasix 60 mg twice daily -Continue BiPAP as needed  -Echocardiogram done, result is pending.  Last echocardiogram result reviewed.   -Monitor strict I/O's and daily weights -Monitor renal function and electrolytes.  Serum creatinine is 1.72 today.  Potassium is 3.4 -Page the cardiology team on call.  Awaiting a callback.   -Guarded long-term prognosis. -Optimization can be challenging in the face of significant right-sided heart failure.  Atrial fibrillation: Remains in atrial fibrillation with rate in the 50s. -Continue Eliquis -Holding nadolol with bradycardia and soft blood pressures -Heart rate remains in the 50s.  COPD/chronic respiratory failure with hypoxia: -Continue supplemental oxygen as needed, BiPAP ordered as needed -Continue albuterol as needed  Hepatic cirrhosis: Suspected NASH versus congestive hepatopathy from RV failure. -Continue Lasix as above -Continue lactulose  CKD stage III: Currently stable, need to monitor closely with diuresis.  Thrombocytopenia: Currently stable without active bleeding.  Likely secondary to liver cirrhosis. -Monitor closely for signs/symptoms of bleeding while on Eliquis  Hypothyroidism: -Continue Synthroid -TSH is 2.298.  OSA/OHS: Continue CPAP at night.  Chronic pain: On OxyContin 40 mg every 12 hours and oxycodone 20 mg as needed every 8 hours as an outpatient.  [place] controlled substance database is reviewed, last prescriptions written 05/16/2019. -Continue home meds as needed with hold parameters  DVT prophylaxis: Eliquis Code Status: Full code, confirmed with patient Family Communication:          Disposition Plan:  This will depend on hospital course. Consults called:  Cardiology  Procedures:   Echocardiogram repeated  Antimicrobials:   None   Subjective: Shortness of breath, but seems to be improving. Significant weight gain since last admission.  Objective: Vitals:   05/26/19 0000 05/26/19 0401 05/26/19 0752 05/26/19 0900  BP:  107/73 99/73  Pulse: (!) 46 (!) 49 (!) 56 (!) 45  Resp: (!) 26 13 (!) 58 17  Temp:  (!) 96 F (35.6 C) (!) 97.5 F (36.4 C)   TempSrc:  Axillary Oral   SpO2: 98% 98% 92% 94%  Weight:      Height:        Intake/Output Summary (Last 24 hours) at 05/26/2019 0926 Last data filed at 05/26/2019 0753 Gross per 24 hour  Intake 565 ml  Output 1200 ml  Net -635 ml   Filed Weights   05/25/19 1755 05/25/19 2237  Weight: (!) 202.3 kg (!) 199.3 kg    Examination:  General exam: Appears calm and comfortable.  Patient is morbidly obese. Respiratory system: Clear to auscultation. Respiratory effort normal. Cardiovascular system: S1 & S2.  Prominent neck veins.  Bilateral leg edema. Gastrointestinal system: Abdomen is morbidly obese, soft and nontender.  Organs are difficult to assess.   Central nervous system: Alert and oriented. No focal neurological deficits. Extremities: Bilateral leg edema  Data Reviewed: I have personally reviewed following labs and imaging studies  CBC: Recent Labs  Lab 05/25/19 1638 05/26/19 0218  WBC 7.1 7.6  HGB 12.6* 12.3*  HCT 40.7 38.9*  MCV 107.1* 104.6*  PLT 114* 528*   Basic Metabolic Panel: Recent Labs  Lab 05/25/19 1638 05/26/19 0218  NA 135 140  K 3.9 3.4*  CL 102 103  CO2 21* 27  GLUCOSE 112* 92  BUN 35* 32*  CREATININE 1.80* 1.72*  CALCIUM 8.5* 8.6*  MG  --  2.4   GFR: Estimated Creatinine Clearance: 75.6 mL/min (A) (by C-G formula based on SCr of 1.72 mg/dL (H)). Liver Function Tests: Recent Labs  Lab 05/25/19 1720  AST 26  ALT 26  ALKPHOS 69  BILITOT 1.0  PROT 5.5*  ALBUMIN 2.9*   No results for input(s): LIPASE, AMYLASE in the last 168 hours. No results for input(s): AMMONIA in the last 168 hours. Coagulation Profile: No results for input(s): INR, PROTIME in the last 168 hours. Cardiac Enzymes: No results for input(s): CKTOTAL, CKMB, CKMBINDEX, TROPONINI in the last 168 hours. BNP (last 3 results) No results for input(s): PROBNP  in the last 8760 hours. HbA1C: No results for input(s): HGBA1C in the last 72 hours. CBG: No results for input(s): GLUCAP in the last 168 hours. Lipid Profile: No results for input(s): CHOL, HDL, LDLCALC, TRIG, CHOLHDL, LDLDIRECT in the last 72 hours. Thyroid Function Tests: Recent Labs    05/26/19 0218  TSH 2.298   Anemia Panel: No results for input(s): VITAMINB12, FOLATE, FERRITIN, TIBC, IRON, RETICCTPCT in the last 72 hours. Urine analysis:    Component Value Date/Time   COLORURINE YELLOW 12/23/2018 0030   APPEARANCEUR CLEAR 12/23/2018 0030   LABSPEC 1.009 12/23/2018 0030   LABSPEC 1.015 01/26/2017 1415   PHURINE 5.0 12/23/2018 0030   GLUCOSEU NEGATIVE 12/23/2018 0030   HGBUR SMALL (A) 12/23/2018 0030   BILIRUBINUR NEGATIVE 12/23/2018 0030   KETONESUR NEGATIVE 12/23/2018 0030   PROTEINUR 100 (A) 12/23/2018 0030   UROBILINOGEN 0.2 01/26/2017 1415   NITRITE NEGATIVE 12/23/2018 0030   LEUKOCYTESUR NEGATIVE 12/23/2018 0030   Sepsis Labs: @LABRCNTIP (procalcitonin:4,lacticidven:4)  ) Recent Results (from the past 240 hour(s))  SARS Coronavirus 2 (CEPHEID- Performed in [place] hospital lab), Hosp Order     Status: None   Collection Time: 05/25/19  7:41 PM   Specimen: Nasopharyngeal Swab  Result Value Ref Range Status   SARS Coronavirus 2 NEGATIVE NEGATIVE Final  Comment: (NOTE) If result is NEGATIVE SARS-CoV-2 target nucleic acids are NOT DETECTED. The SARS-CoV-2 RNA is generally detectable in upper and lower  respiratory specimens during the acute phase of infection. The lowest  concentration of SARS-CoV-2 viral copies this assay can detect is 250  copies / mL. A negative result does not preclude SARS-CoV-2 infection  and should not be used as the sole basis for treatment or other  patient management decisions.  A negative result may occur with  improper specimen collection / handling, submission of specimen other  than nasopharyngeal swab, presence of viral  mutation(s) within the  areas targeted by this assay, and inadequate number of viral copies  (<250 copies / mL). A negative result must be combined with clinical  observations, patient history, and epidemiological information. If result is POSITIVE SARS-CoV-2 target nucleic acids are DETECTED. The SARS-CoV-2 RNA is generally detectable in upper and lower  respiratory specimens dur ing the acute phase of infection.  Positive  results are indicative of active infection with SARS-CoV-2.  Clinical  correlation with patient history and other diagnostic information is  necessary to determine patient infection status.  Positive results do  not rule out bacterial infection or co-infection with other viruses. If result is PRESUMPTIVE POSTIVE SARS-CoV-2 nucleic acids MAY BE PRESENT.   A presumptive positive result was obtained on the submitted specimen  and confirmed on repeat testing.  While 2019 novel coronavirus  (SARS-CoV-2) nucleic acids may be present in the submitted sample  additional confirmatory testing may be necessary for epidemiological  and / or clinical management purposes  to differentiate between  SARS-CoV-2 and other Sarbecovirus currently known to infect humans.  If clinically indicated additional testing with an alternate test  methodology [phone]) is advised. The SARS-CoV-2 RNA is generally  detectable in upper and lower respiratory sp ecimens during the acute  phase of infection. The expected result is Negative. Fact Sheet for Patients:  StrictlyIdeas.no Fact Sheet for Healthcare Providers: BankingDealers.co.za This test is not yet approved or cleared by the [place] FDA and has been authorized for detection and/or diagnosis of SARS-CoV-2 by FDA under an Emergency Use Authorization (EUA).  This EUA will remain in effect (meaning this test can be used) for the duration of the COVID-19 declaration under Section 564(b)(1)  of the Act, 21 U.S.C. section 360bbb-3(b)(1), unless the authorization is terminated or revoked sooner. Performed at [facility], [place] [address], [place], [place] [address]   MRSA PCR Screening     Status: Abnormal   Collection Time: 05/25/19 10:25 PM   Specimen: Nasal Mucosa; Nasopharyngeal  Result Value Ref Range Status   MRSA by PCR POSITIVE (A) NEGATIVE Final    Comment:        The GeneXpert MRSA Assay (FDA approved for NASAL specimens only), is one component of a comprehensive MRSA colonization surveillance program. It is not intended to diagnose MRSA infection nor to guide or monitor treatment for MRSA infections. RESULT CALLED TO, READ BACK BY AND VERIFIED WITH: Jani Gravel RN 05/26/19 0032 JDW Performed at [facility], [place] [address], [place], [place] [address]          Radiology Studies: Dg Chest 2 View  Result Date: 05/25/2019 CLINICAL DATA:  Shortness of breath EXAM: CHEST - 2 VIEW COMPARISON:  12/23/2018 FINDINGS: Enlargement of cardiac silhouette with pulmonary vascular congestion. Mediastinal contours unchanged. Minimal interstitial edema. No pleural effusion or pneumothorax. Bones demineralized. IMPRESSION: Enlargement of cardiac silhouette with pulmonary vascular  congestion and minimal pulmonary edema. Electronically Signed   By: Lavonia Dana M.D.   On: 05/25/2019 17:11        Scheduled Meds: . apixaban  5 mg Oral BID  . Chlorhexidine Gluconate Cloth  6 each Topical Q0600  . fluticasone  2 spray Each Nare QHS  . furosemide  60 mg Intravenous Q12H  . lactulose  10 g Oral Daily  . levothyroxine  125 mcg Oral QAC breakfast  . mupirocin ointment  1 application Nasal BID  . oxyCODONE  40 mg Oral Q12H  . potassium chloride  20 mEq Oral Daily  . sodium chloride flush  3 mL Intravenous Once  . sodium chloride flush  3 mL Intravenous Q12H   Continuous Infusions:   LOS: 1 day    Time spent: 35 minutes    Dana Allan, MD  [facility] Pager #: [phone] 7PM-7AM contact night coverage as above

## 2019-05-27 LAB — RENAL FUNCTION PANEL
Albumin: 2.9 g/dL — ABNORMAL LOW (ref 3.5–5.0)
Anion gap: 12 (ref 5–15)
BUN: 28 mg/dL — ABNORMAL HIGH (ref 8–23)
CO2: 27 mmol/L (ref 22–32)
Calcium: 8.7 mg/dL — ABNORMAL LOW (ref 8.9–10.3)
Chloride: 100 mmol/L (ref 98–111)
Creatinine, Ser: 1.43 mg/dL — ABNORMAL HIGH (ref 0.61–1.24)
GFR calc Af Amer: 59 mL/min — ABNORMAL LOW (ref >60)
GFR calc non Af Amer: 51 mL/min — ABNORMAL LOW (ref >60)
Glucose, Bld: 85 mg/dL (ref 70–99)
Phosphorus: 3.4 mg/dL (ref 2.5–4.6)
Potassium: 3.5 mmol/L (ref 3.5–5.1)
Sodium: 139 mmol/L (ref 135–145)

## 2019-05-27 LAB — MAGNESIUM: Magnesium: 2.3 mg/dL (ref 1.7–2.4)

## 2019-05-27 NOTE — Plan of Care (Signed)
Patient is progressing to meet goals on care plan.

## 2019-05-27 NOTE — Progress Notes (Signed)
PROGRESS NOTE    Albert James  IOX:[id number] DOB: 09-21-1956 DOA: 05/25/2019 PCP: Mayra Neer, MD  Outpatient Specialists:   Brief Narrative:  Patient is a 63 year old Caucasian male, morbidly obese, with past medical history significant for chronic diastolic CHF, EF 26-83%, severe RV dysfunction and enlargement, severe pulmonary hypertension with PA peak pressure of 89 mmHg as per last echo,  severe right atrium enlargement and tricuspid regurgitation, COPD, chronic respiratory failure with hypoxia usually on 2 L O2 via [place], atrial fibrillation on Eliquis, cirrhosis, leukocytopenia, CKD stage III, history of follicular small cell non-Hodgkin's lymphoma in remission, hypothyroidism, and OSA/OHS on CPAP.  Patient presented with worsening shortness of breath and 60 pound weight gain over the preceding 1 month.  Apparently, patient's primary care provider may have decreased the dose of his diuretics due to worsening renal function.  Patient is currently on IV Lasix 60 mg twice daily, with improvement in renal function noted (indicative of likely cardiorenal syndrome).  Some improvement in respiratory symptoms also reported.  However, patient is still concerned about significant weight gain.  We will continue diuretics for now, and continue to monitor volume status.  Consulted cardiology team, hopefully, heart failure team will see patient tomorrow, 05/28/2019.  Assessment & Plan:   Principal Problem:   Acute on chronic diastolic (congestive) heart failure (HCC) Active Problems:   Chronic atrial fibrillation   Cirrhosis (HCC)   Pulmonary hypertension (HCC)   Thrombocytopenia (HCC)   OSA on CPAP   COPD (chronic obstructive pulmonary disease) (HCC)   Chronic respiratory failure with hypoxia (HCC)   Acute on chronic diastolic CHF exacerbation/severe pulmonary hypertension: -Documented 60-65 pound weight gain since 03/06/2019.   -Will need careful monitoring with diuresis given history of  severe pulmonary hypertension and RV dysfunction. -Repeat echocardiogram revealed RVSP of 96.2 and in.  Relaxation with EF of 60 to 65% -Continue IV Lasix 60 mg twice daily -Continue BiPAP as needed.   -Monitor strict I/O's and daily weights -Monitor renal function and electrolytes.   -Serum creatinine is down to 1.43 today (from 1.72).  -Potassium is 3.5   -Guarded long-term prognosis. -Optimization can be challenging in the face of significant right-sided heart failure.  Atrial fibrillation: -Remains in atrial fibrillation with rate ranging from 39 to 102 bpm over the last 24 hours i -Continue Eliquis -Holding nadolol due to bradycardia and soft blood pressures  COPD/chronic respiratory failure with hypoxia: -Continue supplemental oxygen as needed, BiPAP ordered as needed -Continue albuterol as needed  Hepatic cirrhosis: Suspected NASH versus congestive hepatopathy from RV failure. -Continue Lasix as above -Continue lactulose  CKD stage III: Currently stable, need to monitor closely with diuresis.  Thrombocytopenia: Currently stable without active bleeding.  Likely secondary to liver cirrhosis. -Monitor closely for signs/symptoms of bleeding while on Eliquis  Hypothyroidism: -Continue Synthroid -TSH is 2.298.  OSA/OHS: Continue CPAP at night.  Chronic pain: On OxyContin 40 mg every 12 hours and oxycodone 20 mg as needed every 8 hours as an outpatient.  [place] controlled substance database is reviewed, last prescriptions written 05/16/2019. -Continue home meds as needed with hold parameters  DVT prophylaxis: Eliquis Code Status: Full code, confirmed with patient Family Communication:          Disposition Plan:  This will depend on hospital course. Consults called:  Cardiology  Procedures:   Echocardiogram repeated  Antimicrobials:   None   Subjective: Shortness of breath has improved some. Weight gain remains a problem. No fever or chills. No chest  pain.  Objective: Vitals:   05/27/19 0300 05/27/19 0400 05/27/19 0752 05/27/19 1100  BP: 100/74  103/69 96/67  Pulse: 67 (!) 48 75 70  Resp: 12 18 18 19   Temp: (!) 97.4 F (36.3 C)  (!) 97.4 F (36.3 C) 97.6 F (36.4 C)  TempSrc: Oral  Oral Oral  SpO2: 99% 91% 95% 93%  Weight: (!) 199 kg     Height:        Intake/Output Summary (Last 24 hours) at 05/27/2019 1432 Last data filed at 05/27/2019 1325 Gross per 24 hour  Intake 1320 ml  Output 2600 ml  Net -1280 ml   Filed Weights   05/25/19 1755 05/25/19 2237 05/27/19 0300  Weight: (!) 202.3 kg (!) 199.3 kg (!) 199 kg    Examination:  General exam: Appears calm and comfortable.  Patient is morbidly obese. Respiratory system: Clear to auscultation. Respiratory effort normal. Cardiovascular system: S1 & S2.  Prominent neck veins.  Bilateral leg edema. Gastrointestinal system: Abdomen is morbidly obese, soft and nontender.  Organs are difficult to assess.   Central nervous system: Alert and oriented. No focal neurological deficits. Extremities: Bilateral leg edema, 3+.  Data Reviewed: I have personally reviewed following labs and imaging studies  CBC: Recent Labs  Lab 05/25/19 1638 05/26/19 0218  WBC 7.1 7.6  HGB 12.6* 12.3*  HCT 40.7 38.9*  MCV 107.1* 104.6*  PLT 114* 852*   Basic Metabolic Panel: Recent Labs  Lab 05/25/19 1638 05/26/19 0218 05/27/19 0212  NA 135 140 139  K 3.9 3.4* 3.5  CL 102 103 100  CO2 21* 27 27  GLUCOSE 112* 92 85  BUN 35* 32* 28*  CREATININE 1.80* 1.72* 1.43*  CALCIUM 8.5* 8.6* 8.7*  MG  --  2.4 2.3  PHOS  --   --  3.4   GFR: Estimated Creatinine Clearance: 90.9 mL/min (A) (by C-G formula based on SCr of 1.43 mg/dL (H)). Liver Function Tests: Recent Labs  Lab 05/25/19 1720 05/27/19 0212  AST 26  --   ALT 26  --   ALKPHOS 69  --   BILITOT 1.0  --   PROT 5.5*  --   ALBUMIN 2.9* 2.9*   No results for input(s): LIPASE, AMYLASE in the last 168 hours. No results for  input(s): AMMONIA in the last 168 hours. Coagulation Profile: No results for input(s): INR, PROTIME in the last 168 hours. Cardiac Enzymes: No results for input(s): CKTOTAL, CKMB, CKMBINDEX, TROPONINI in the last 168 hours. BNP (last 3 results) No results for input(s): PROBNP in the last 8760 hours. HbA1C: No results for input(s): HGBA1C in the last 72 hours. CBG: No results for input(s): GLUCAP in the last 168 hours. Lipid Profile: No results for input(s): CHOL, HDL, LDLCALC, TRIG, CHOLHDL, LDLDIRECT in the last 72 hours. Thyroid Function Tests: Recent Labs    05/26/19 0218  TSH 2.298   Anemia Panel: No results for input(s): VITAMINB12, FOLATE, FERRITIN, TIBC, IRON, RETICCTPCT in the last 72 hours. Urine analysis:    Component Value Date/Time   COLORURINE YELLOW 12/23/2018 0030   APPEARANCEUR CLEAR 12/23/2018 0030   LABSPEC 1.009 12/23/2018 0030   LABSPEC 1.015 01/26/2017 1415   PHURINE 5.0 12/23/2018 0030   GLUCOSEU NEGATIVE 12/23/2018 0030   HGBUR SMALL (A) 12/23/2018 0030   BILIRUBINUR NEGATIVE 12/23/2018 0030   KETONESUR NEGATIVE 12/23/2018 0030   PROTEINUR 100 (A) 12/23/2018 0030   UROBILINOGEN 0.2 01/26/2017 1415   NITRITE NEGATIVE 12/23/2018 0030   LEUKOCYTESUR  NEGATIVE 12/23/2018 0030   Sepsis Labs: @LABRCNTIP (procalcitonin:4,lacticidven:4)  ) Recent Results (from the past 240 hour(s))  SARS Coronavirus 2 (CEPHEID- Performed in [place] hospital lab), Hosp Order     Status: None   Collection Time: 05/25/19  7:41 PM   Specimen: Nasopharyngeal Swab  Result Value Ref Range Status   SARS Coronavirus 2 NEGATIVE NEGATIVE Final    Comment: (NOTE) If result is NEGATIVE SARS-CoV-2 target nucleic acids are NOT DETECTED. The SARS-CoV-2 RNA is generally detectable in upper and lower  respiratory specimens during the acute phase of infection. The lowest  concentration of SARS-CoV-2 viral copies this assay can detect is 250  copies / mL. A negative result does not  preclude SARS-CoV-2 infection  and should not be used as the sole basis for treatment or other  patient management decisions.  A negative result may occur with  improper specimen collection / handling, submission of specimen other  than nasopharyngeal swab, presence of viral mutation(s) within the  areas targeted by this assay, and inadequate number of viral copies  (<250 copies / mL). A negative result must be combined with clinical  observations, patient history, and epidemiological information. If result is POSITIVE SARS-CoV-2 target nucleic acids are DETECTED. The SARS-CoV-2 RNA is generally detectable in upper and lower  respiratory specimens dur ing the acute phase of infection.  Positive  results are indicative of active infection with SARS-CoV-2.  Clinical  correlation with patient history and other diagnostic information is  necessary to determine patient infection status.  Positive results do  not rule out bacterial infection or co-infection with other viruses. If result is PRESUMPTIVE POSTIVE SARS-CoV-2 nucleic acids MAY BE PRESENT.   A presumptive positive result was obtained on the submitted specimen  and confirmed on repeat testing.  While 2019 novel coronavirus  (SARS-CoV-2) nucleic acids may be present in the submitted sample  additional confirmatory testing may be necessary for epidemiological  and / or clinical management purposes  to differentiate between  SARS-CoV-2 and other Sarbecovirus currently known to infect humans.  If clinically indicated additional testing with an alternate test  methodology [phone]) is advised. The SARS-CoV-2 RNA is generally  detectable in upper and lower respiratory sp ecimens during the acute  phase of infection. The expected result is Negative. Fact Sheet for Patients:  StrictlyIdeas.no Fact Sheet for Healthcare Providers: BankingDealers.co.za This test is not yet approved or cleared by  the [place] FDA and has been authorized for detection and/or diagnosis of SARS-CoV-2 by FDA under an Emergency Use Authorization (EUA).  This EUA will remain in effect (meaning this test can be used) for the duration of the COVID-19 declaration under Section 564(b)(1) of the Act, 21 U.S.C. section 360bbb-3(b)(1), unless the authorization is terminated or revoked sooner. Performed at [facility], [place] [address], [place], [place] [address]   MRSA PCR Screening     Status: Abnormal   Collection Time: 05/25/19 10:25 PM   Specimen: Nasal Mucosa; Nasopharyngeal  Result Value Ref Range Status   MRSA by PCR POSITIVE (A) NEGATIVE Final    Comment:        The GeneXpert MRSA Assay (FDA approved for NASAL specimens only), is one component of a comprehensive MRSA colonization surveillance program. It is not intended to diagnose MRSA infection nor to guide or monitor treatment for MRSA infections. RESULT CALLED TO, READ BACK BY AND VERIFIED WITH: Jani Gravel RN 05/26/19 0032 JDW Performed at [facility], [place] [address]  [place], [place] [address]          Radiology Studies: Dg Chest 2 View  Result Date: 05/25/2019 CLINICAL DATA:  Shortness of breath EXAM: CHEST - 2 VIEW COMPARISON:  12/23/2018 FINDINGS: Enlargement of cardiac silhouette with pulmonary vascular congestion. Mediastinal contours unchanged. Minimal interstitial edema. No pleural effusion or pneumothorax. Bones demineralized. IMPRESSION: Enlargement of cardiac silhouette with pulmonary vascular congestion and minimal pulmonary edema. Electronically Signed   By: Lavonia Dana M.D.   On: 05/25/2019 17:11        Scheduled Meds: . apixaban  5 mg Oral BID  . Chlorhexidine Gluconate Cloth  6 each Topical Q0600  . fluticasone  2 spray Each Nare QHS  . furosemide  60 mg Intravenous Q12H  . lactulose  10 g Oral Daily  . levothyroxine  125 mcg Oral QAC breakfast  . mupirocin ointment  1 application Nasal BID  .  oxyCODONE  40 mg Oral Q12H  . potassium chloride  20 mEq Oral Daily  . sodium chloride flush  3 mL Intravenous Once  . sodium chloride flush  3 mL Intravenous Q12H   Continuous Infusions:   LOS: 2 days    Time spent: 35 minutes    Dana Allan, MD  [facility] Pager #: [phone] 7PM-7AM contact night coverage as above

## 2019-05-28 ENCOUNTER — Inpatient Hospital Stay: Payer: Self-pay

## 2019-05-28 ENCOUNTER — Encounter (HOSPITAL_COMMUNITY): Payer: Self-pay | Admitting: Physician Assistant

## 2019-05-28 DIAGNOSIS — G4733 Obstructive sleep apnea (adult) (pediatric): Secondary | ICD-10-CM

## 2019-05-28 DIAGNOSIS — J449 Chronic obstructive pulmonary disease, unspecified: Secondary | ICD-10-CM

## 2019-05-28 DIAGNOSIS — Z9989 Dependence on other enabling machines and devices: Secondary | ICD-10-CM

## 2019-05-28 DIAGNOSIS — D696 Thrombocytopenia, unspecified: Secondary | ICD-10-CM

## 2019-05-28 DIAGNOSIS — I5033 Acute on chronic diastolic (congestive) heart failure: Principal | ICD-10-CM

## 2019-05-28 DIAGNOSIS — I272 Pulmonary hypertension, unspecified: Secondary | ICD-10-CM

## 2019-05-28 LAB — RENAL FUNCTION PANEL
Albumin: 2.8 g/dL — ABNORMAL LOW (ref 3.5–5.0)
Anion gap: 10 (ref 5–15)
BUN: 27 mg/dL — ABNORMAL HIGH (ref 8–23)
CO2: 26 mmol/L (ref 22–32)
Calcium: 8.6 mg/dL — ABNORMAL LOW (ref 8.9–10.3)
Chloride: 102 mmol/L (ref 98–111)
Creatinine, Ser: 1.27 mg/dL — ABNORMAL HIGH (ref 0.61–1.24)
GFR calc Af Amer: >60 mL/min (ref >60)
GFR calc non Af Amer: 59 mL/min — ABNORMAL LOW (ref >60)
Glucose, Bld: 119 mg/dL — ABNORMAL HIGH (ref 70–99)
Phosphorus: 3.3 mg/dL (ref 2.5–4.6)
Potassium: 3.5 mmol/L (ref 3.5–5.1)
Sodium: 138 mmol/L (ref 135–145)

## 2019-05-28 LAB — MAGNESIUM: Magnesium: 2.3 mg/dL (ref 1.7–2.4)

## 2019-05-28 MED ORDER — POTASSIUM CHLORIDE CRYS ER 20 MEQ PO TBCR
40.0000 meq | EXTENDED_RELEASE_TABLET | Freq: Two times a day (BID) | ORAL | Status: DC
  Administered 2019-05-28 – 2019-05-29 (×3): 40 meq via ORAL
  Filled 2019-05-28 (×3): qty 2

## 2019-05-28 MED ORDER — SPIRONOLACTONE 12.5 MG HALF TABLET
12.5000 mg | ORAL_TABLET | Freq: Every day | ORAL | Status: DC
  Administered 2019-05-29: 12.5 mg via ORAL
  Filled 2019-05-28: qty 1

## 2019-05-28 MED ORDER — FUROSEMIDE 10 MG/ML IJ SOLN
80.0000 mg | Freq: Three times a day (TID) | INTRAMUSCULAR | Status: DC
  Administered 2019-05-28 – 2019-05-31 (×8): 80 mg via INTRAVENOUS
  Filled 2019-05-28 (×8): qty 8

## 2019-05-28 MED ORDER — HYDROCERIN EX CREA
TOPICAL_CREAM | Freq: Every day | CUTANEOUS | Status: DC
  Administered 2019-05-28 – 2019-05-29 (×2): via TOPICAL
  Administered 2019-05-30: 1 via TOPICAL
  Administered 2019-05-31 – 2019-06-09 (×10): via TOPICAL
  Filled 2019-05-28 (×4): qty 113
  Filled 2019-05-28: qty 226
  Filled 2019-05-28 (×2): qty 113

## 2019-05-28 NOTE — Progress Notes (Signed)
PROGRESS NOTE  Albert James QJJ:[id number] DOB: Apr 15, 1956 DOA: 05/25/2019 PCP: Mayra Neer, MD   LOS: 3 days   Brief narrative: Patient is a 63 year old Caucasian male, morbidly obese, with past medical history significant for chronic diastolic CHF, EF 48-18%, severe RV dysfunction and enlargement, severe pulmonary hypertension with PA peak pressure of 89 mmHg as per last echo, severe right atrium enlargement and tricuspid regurgitation, COPD, chronic respiratory failure with hypoxia usually on 2 L O2 via Albert James,atrial fibrillation on Eliquis, cirrhosis, leukocytopenia, CKD stage III, history of follicular small cell non-Hodgkin's lymphoma in remission, hypothyroidism, and OSA/OHS on CPAP.  Patient presented with worsening shortness of breath and 60 pound weight gain over the preceding 1 month.  Apparently, patient's primary care provider may have decreased the dose of his diuretics due to worsening renal function.  Patient is currently on IV Lasix 60 mg twice daily, with improvement in renal function noted (indicative of likely cardiorenal syndrome).   Subjective: Patient states that his swelling and edema has not improved. He feels like diuretics are not working.  He denies obvious chest pain palpitation or dyspnea at this time.  Complains of gross lower extremity swelling and wishes to have Unna boot.  Assessment/Plan:  Principal Problem:   Acute on chronic diastolic (congestive) heart failure (HCC) Active Problems:   Chronic atrial fibrillation   Cirrhosis (HCC)   Pulmonary hypertension (HCC)   Thrombocytopenia (HCC)   OSA on CPAP   COPD (chronic obstructive pulmonary disease) (HCC)   Chronic respiratory failure with hypoxia (HCC)  Acute on chronic diastolic CHF exacerbation/severe pulmonary hypertension: -Documented 60-65 pound weight gain since 03/06/2019.  Continue with careful diuresis with severe pulmonary hypertension and RV dysfunction.  Repeat 2D echocardiogram  reviewed with ejection fraction of 60 to 65% with increased right ventricular systolic pressure of 96.  On Lasix twice a day.  We will continue strict intake and output charting daily weights.  Cardiology has been consulted for further evaluation and treatment.  Will consult heart failure team.  Continue BiPAP as necessary.  Bilateral lower extremity gross edema.  Wound care has been consulted.  Patient will likely benefit from Kerlix wrap.  Atrial fibrillation: On Continue Eliquis, Nadolol was kept on hold due to low blood pressure and bradycardia.  COPD/chronic respiratory failure with hypoxia: -We will continue oxygen, nebulizer.  BiPAP PRN.    Hepaticcirrhosis: Suspected NASH versus congestive hepatopathy from RV failure. Continue Lasix and lactulose.  CKD stage III: Creatininie levels improving with diuresis.  Will closely monitor BMP.  Thrombocytopenia: Closely monitor platelets.  No evidence of bleeding.  Check CBC in a.m.  Hypothyroidism: -Continue Synthroid,TSH is 2.298.  OSA/OHS: Continue CPAP at night.  Chronic pain: On OxyContin 40 mg every 12 hours and oxycodone 20 mg as needed every 8 hours as an outpatient. [place] controlled substance database with last prescriptionswritten 05/16/2019. -Continue home meds as needed with hold parameters  VTE Prophylaxis: Eliquis  Code Status: Full code  Family Communication: None  Disposition Plan: Depending upon hospital course.  Will consult the heart failure team to follow.  Likely 3 to 4 days.  We will get CBC BMP in a.m.  Consultants:  Cardiology  Procedures:  2D echocardiogram  Antibiotics: Anti-infectives (From admission, onward)   None      Objective: Vitals:   05/28/19 0727 05/28/19 1000  BP: 111/71   Pulse: (!) 59 (!) 51  Resp: 16 (!) 23  Temp:    SpO2: 91% 95%    Intake/Output Summary (  Last 24 hours) at 05/28/2019 1223 Last data filed at 05/28/2019 1000 Gross per 24 hour  Intake 1400 ml   Output 2325 ml  Net -925 ml   Filed Weights   05/25/19 2237 05/27/19 0300 05/28/19 0000  Weight: (!) 199.3 kg (!) 199 kg (!) 197.9 kg   Body mass index is 60.85 kg/m.   Physical Exam: GENERAL: Patient is alert awake and oriented. Not in obvious distress.  Morbidly obese HENT: No scleral pallor or icterus. Pupils equally reactive to light. Oral mucosa is moist NECK: is supple, no palpable thyroid enlargement. CHEST:  Diminished breath sounds bilaterally. CVS: S1 and S2 heard, no murmur, irregular rhythm. No pericardial rub. ABDOMEN: Soft, non-tender, bowel sounds are present. No palpable hepato-splenomegaly. EXTREMITIES: Bilateral lower extremity gross pitting edema with signs of venous insufficiency.  CNS: Cranial nerves are intact. No focal motor or sensory deficits. SKIN: warm and dry without rashes.  Bilateral lower extremity edema  Data Review: I have personally reviewed the following laboratory data and studies,  CBC: Recent Labs  Lab 05/25/19 1638 05/26/19 0218  WBC 7.1 7.6  HGB 12.6* 12.3*  HCT 40.7 38.9*  MCV 107.1* 104.6*  PLT 114* 027*   Basic Metabolic Panel: Recent Labs  Lab 05/25/19 1638 05/26/19 0218 05/27/19 0212 05/28/19 0303  NA 135 140 139 138  K 3.9 3.4* 3.5 3.5  CL 102 103 100 102  CO2 21* 27 27 26   GLUCOSE 112* 92 85 119*  BUN 35* 32* 28* 27*  CREATININE 1.80* 1.72* 1.43* 1.27*  CALCIUM 8.5* 8.6* 8.7* 8.6*  MG  --  2.4 2.3 2.3  PHOS  --   --  3.4 3.3   Liver Function Tests: Recent Labs  Lab 05/25/19 1720 05/27/19 0212 05/28/19 0303  AST 26  --   --   ALT 26  --   --   ALKPHOS 69  --   --   BILITOT 1.0  --   --   PROT 5.5*  --   --   ALBUMIN 2.9* 2.9* 2.8*   No results for input(s): LIPASE, AMYLASE in the last 168 hours. No results for input(s): AMMONIA in the last 168 hours. Cardiac Enzymes: No results for input(s): CKTOTAL, CKMB, CKMBINDEX, TROPONINI in the last 168 hours. BNP (last 3 results) Recent Labs    12/23/18 0004  05/25/19 1720  BNP 1,221.0* 740.5*    ProBNP (last 3 results) No results for input(s): PROBNP in the last 8760 hours.  CBG: No results for input(s): GLUCAP in the last 168 hours. Recent Results (from the past 240 hour(s))  SARS Coronavirus 2 (CEPHEID- Performed in [place] hospital lab), Hosp Order     Status: None   Collection Time: 05/25/19  7:41 PM   Specimen: Nasopharyngeal Swab  Result Value Ref Range Status   SARS Coronavirus 2 NEGATIVE NEGATIVE Final    Comment: (NOTE) If result is NEGATIVE SARS-CoV-2 target nucleic acids are NOT DETECTED. The SARS-CoV-2 RNA is generally detectable in upper and lower  respiratory specimens during the acute phase of infection. The lowest  concentration of SARS-CoV-2 viral copies this assay can detect is 250  copies / mL. A negative result does not preclude SARS-CoV-2 infection  and should not be used as the sole basis for treatment or other  patient management decisions.  A negative result may occur with  improper specimen collection / handling, submission of specimen other  than nasopharyngeal swab, presence of viral mutation(s) within the  areas  targeted by this assay, and inadequate number of viral copies  (<250 copies / mL). A negative result must be combined with clinical  observations, patient history, and epidemiological information. If result is POSITIVE SARS-CoV-2 target nucleic acids are DETECTED. The SARS-CoV-2 RNA is generally detectable in upper and lower  respiratory specimens dur ing the acute phase of infection.  Positive  results are indicative of active infection with SARS-CoV-2.  Clinical  correlation with patient history and other diagnostic information is  necessary to determine patient infection status.  Positive results do  not rule out bacterial infection or co-infection with other viruses. If result is PRESUMPTIVE POSTIVE SARS-CoV-2 nucleic acids MAY BE PRESENT.   A presumptive positive result was obtained on  the submitted specimen  and confirmed on repeat testing.  While 2019 novel coronavirus  (SARS-CoV-2) nucleic acids may be present in the submitted sample  additional confirmatory testing may be necessary for epidemiological  and / or clinical management purposes  to differentiate between  SARS-CoV-2 and other Sarbecovirus currently known to infect humans.  If clinically indicated additional testing with an alternate test  methodology [phone]) is advised. The SARS-CoV-2 RNA is generally  detectable in upper and lower respiratory sp ecimens during the acute  phase of infection. The expected result is Negative. Fact Sheet for Patients:  StrictlyIdeas.no Fact Sheet for Healthcare Providers: BankingDealers.co.za This test is not yet approved or cleared by the [place] FDA and has been authorized for detection and/or diagnosis of SARS-CoV-2 by FDA under an Emergency Use Authorization (EUA).  This EUA will remain in effect (meaning this test can be used) for the duration of the COVID-19 declaration under Section 564(b)(1) of the Act, 21 U.S.C. section 360bbb-3(b)(1), unless the authorization is terminated or revoked sooner. Performed at [facility], [place] [address], [place], [place] [address]   MRSA PCR Screening     Status: Abnormal   Collection Time: 05/25/19 10:25 PM   Specimen: Nasal Mucosa; Nasopharyngeal  Result Value Ref Range Status   MRSA by PCR POSITIVE (A) NEGATIVE Final    Comment:        The GeneXpert MRSA Assay (FDA approved for NASAL specimens only), is one component of a comprehensive MRSA colonization surveillance program. It is not intended to diagnose MRSA infection nor to guide or monitor treatment for MRSA infections. RESULT CALLED TO, READ BACK BY AND VERIFIED WITH: Jani Gravel RN 05/26/19 0032 JDW Performed at [facility], [place] [address], [place], [place] [address]      Studies: No results  found.  Scheduled Meds: . apixaban  5 mg Oral BID  . Chlorhexidine Gluconate Cloth  6 each Topical Q0600  . fluticasone  2 spray Each Nare QHS  . furosemide  60 mg Intravenous Q12H  . hydrocerin   Topical Daily  . lactulose  10 g Oral Daily  . levothyroxine  125 mcg Oral QAC breakfast  . mupirocin ointment  1 application Nasal BID  . oxyCODONE  40 mg Oral Q12H  . potassium chloride  20 mEq Oral Daily  . sodium chloride flush  3 mL Intravenous Once  . sodium chloride flush  3 mL Intravenous Q12H    Continuous Infusions:   Flora Lipps, MD  [facility] 05/28/2019

## 2019-05-28 NOTE — Care Management Important Message (Signed)
Important Message  Patient Details  Name: Albert James MRN: [id number] Date of Birth: Jul 20, 1956   Medicare Important Message Given:  Yes     Memory Argue 05/28/2019, 3:31 PM   SIGNED

## 2019-05-28 NOTE — Progress Notes (Signed)
Patient has home CPAP unit at bedside. RN at bedside adding distilled H20 to chamber.  Patient to place himself on upon moving to bed.  3L O2 bleed in.

## 2019-05-28 NOTE — Consult Note (Signed)
[facility] Nurse wound consult note Patient receiving care in [place]. Reason for Consult: severe edema to BLE Wound type: NO wounds present, only massive edema with some erythema.  Dressing procedure/placement/frequency: Eucerin cream to BLE, then beginning just behind the toes, spiral wrap kerlex (2 rolls per leg) to just below the knee.  Then cover with spiral wrapped 4 inch Ace wraps (2 rolls per leg).  I have also requested placement of a Sizewise bed with AIR mattress.  The current bed is too small for the patient.  Thank you for the consult.  Discussed plan of care with the patient and bedside nurse.  [place] nurse will not follow at this time.  Please re-consult the [place] team if needed.  Val Riles, RN, MSN, CWOCN, CNS-BC, pager [phone]

## 2019-05-28 NOTE — Consult Note (Addendum)
Cardiology Consultation:   Patient ID: Albert James; [id number]; 03-04-1956   Admit date: 05/25/2019 Date of Consult: 05/28/2019  Primary Care Provider: Mayra Neer, MD Primary Cardiologist: [name], MD Primary Electrophysiologist:  None  Chief Complaint: swelling, shortness of breath, weight gain  Patient Profile:   Albert James is a 63 y.o. male with a hx of permanent atrial fibrillation (since at least 2014), NASH with cirrhosis, super morbid obesity with suspected OHS/OSA, chronic diastolic CHF/RV failure, severe pulmonary HTN, severe COPD by PFTs 11/2017, chronic respiratory failure, anxiety, depression, CKD III, NHL in remission, varicose veins, chronic pain who is being seen today for the evaluation of CHF at the request of Dr. Louanne Belton.  History of Present Illness:   Per notes, patient has had atrial fibrillation since at least 2015. He has also had LEE and dyspnea for many years but this worsened significantly towards the end of 2018. Echo in 09/2017 showed EF 50-55%, septal flattening, severe RV dilation, PASP 77 mmHg. LHC/RHC in 1/19 showed no CAD, + evidence for RV failure and severe pulmonary hypertension. PFTs in 1/19 showed severe COPD. V/Q scan in 1/19 showed no evidence for chronic PEs. Sleep study showed severe OSA, using CPAP. His cirrhosis has been felt due to NASH vs congestive hepatopathy from RV failure, but cannot totally r/o component of portopulmonary HTN. He denies prior ETOH.   His last HF admission was in 12/2018 when he diuresed 51lb via Lasix gtt and required dc to SNF. He was seen by palliative care at that time and elected to remain full code. His prognosis was felt to be poor long term in absence of significant weight loss. He saw Amy 02/2019 and was felt to be stable.  About 6 weeks ago, he saw primary care who was concerned about renal function with rise in Cr to 2.56 therefore torsemide was decreased from 34m BID to once daily. He  subsequently called in to CHF clinic with increased SOB, edema and weight gain and was advised to increase torsemide back to 848mBID x 2 days  then 8045mAM/27m79mM. Cr on 6/16 was still 2.2 so he was asked to hold torsemide x 2 days then resume at the 80/40 dosing. Cr improved to 1.85 6/23.  He called in 7/7 with worsening symptoms while taking torsemide 80mg58m and was asked to increase to 100mg 61m This did not resolve symptoms so he presented to the hospital 05/25/19 with persistent volume retention and decreased UOP. Spironolactone is no longer on his med list; he is not sure at what point he stopped taking this. Prior dry weight around 380-385, admitted at 446lb. He was admitted by IM and placed on IV Lasix. He has diuresed -2.8L thus far and down 10lb although feels only marginally better. Labs show mild anemia (macroctyic), Cr 1.8->1.72->1.43->1.27, albumin 2.8, MRSA+. His most recent 2D echo 05/26/19 showed EF 60-65%, moderate LVH, + impaired diastolic relaxation, +RV volume and pressure overload, moderately reduced RV function, moderately enlarged RV cavity, RVSP of 96mmHg16mldly dilated LA, severely dilated RA, severe tricuspid regurgitation, mild-moderate pulmonic regurg, dilated IVC.   Past Medical History:  Diagnosis Date  . Abdominal hernia    can' t  fix per pt, it is large  . Anxiety   . Arthritis    lower back  . Back pain   . Chronic diastolic CHF (congestive heart failure) (HCC)   [place] respiratory failure ([facility]    a. on home O2.  .Marland Kitchen  Cirrhosis ([place])    a. felt due to NASH vs congestive hepatopathy from RV failure, but cannot totally r/o component of portopulmonary HTN.  . CKD (chronic kidney disease), stage III ([place])   . COPD (chronic obstructive pulmonary disease) ([place])    a. severe by PFTs 11/2017.  . Cor pulmonale (chronic) ([place])   . Depression   . Edema of both legs   . Knee pain both knees   wears compression due to edema  . Morbid obesity ([place])   . NASH  (nonalcoholic steatohepatitis)   . nhl; dx'd 2006   chemo comp, lymphoma  . Non Hodgkin's lymphoma ([place])    hx of   . Obesity hypoventilation syndrome ([place])   . OSA (obstructive sleep apnea)   . Permanent atrial fibrillation   . Pulmonary hypertension ([place])   . Shoulder pain   . Swelling of lower extremity    both legs, uses compression wraps all the time  . Ulcer    left leg, now healed  . Varicose veins     Past Surgical History:  Procedure Laterality Date  . APPENDECTOMY    . biopsy on back at disc     . COLONOSCOPY WITH PROPOFOL N/A 10/24/2014   Procedure: COLONOSCOPY WITH PROPOFOL;  Surgeon: Lear Ng, MD;  Location: [facility];  Service: Endoscopy;  Laterality: N/A;  . ENDOVENOUS ABLATION SAPHENOUS VEIN W/ LASER Left 06-10-2014   EVLA  Left greater saphenous vein by Curt Jews MD    . HERNIA REPAIR  bdomen as child umbilical as adult   x 2   . left elbow surgery      pin out then pin removed   . PORTACATH PLACEMENT  2000   placed and removed  . portacath removal     . RIGHT/LEFT HEART CATH AND CORONARY ANGIOGRAPHY N/A 11/21/2017   Procedure: RIGHT/LEFT HEART CATH AND CORONARY ANGIOGRAPHY;  Surgeon: Larey Dresser, MD;  Location: [facility];  Service: Cardiovascular;  Laterality: N/A;  . SHOULDER OPEN ROTATOR CUFF REPAIR Left 07/12/2014   Procedure: LEFT SHOULDER MINI OPEN ROTATOR CUFF REPAIR  SUBACROMIAL DECOMPRESSION ;  Surgeon: Johnn Hai, MD;  Location: [facility];  Service: Orthopedics;  Laterality: Left;     Inpatient Medications: Scheduled Meds: . apixaban  5 mg Oral BID  . Chlorhexidine Gluconate Cloth  6 each Topical Q0600  . fluticasone  2 spray Each Nare QHS  . furosemide  60 mg Intravenous Q12H  . hydrocerin   Topical Daily  . lactulose  10 g Oral Daily  . levothyroxine  125 mcg Oral QAC breakfast  . mupirocin ointment  1 application Nasal BID  . oxyCODONE  40 mg Oral Q12H  . potassium chloride  20 mEq Oral Daily  . sodium  chloride flush  3 mL Intravenous Once  . sodium chloride flush  3 mL Intravenous Q12H   Continuous Infusions:  PRN Meds: acetaminophen **OR** acetaminophen, albuterol, oxyCODONE  Home Meds: Prior to Admission medications   Medication Sig Start Date End Date Taking? Authorizing Provider  cycloSPORINE (RESTASIS) 0.05 % ophthalmic emulsion Place 1 drop into both eyes 2 (two) times daily as needed (for eye irritation/dryness).   Yes [provider]  diclofenac sodium (VOLTAREN) 1 % GEL Apply 1 g topically 4 (four) times daily as needed (for knee pain.).   Yes [provider]  ELIQUIS 5 MG TABS tablet TAKE 1 TABLET(5 MG) BY MOUTH TWICE DAILY Patient taking differently: Take 5 mg  by mouth 2 (two) times daily.  05/09/19  Yes Larey Dresser, MD  fluticasone [facility]) 50 MCG/ACT nasal spray Place 2 sprays into both nostrils at bedtime.  04/24/12  Yes [provider]  lactulose (CHRONULAC) 10 GM/15ML solution Take 10 g by mouth daily.  11/09/11  Yes [provider]  levothyroxine (SYNTHROID, LEVOTHROID) 125 MCG tablet Take 125 mcg by mouth daily before breakfast.  06/01/17  Yes [provider]  nadolol (CORGARD) 20 MG tablet Take 20 mg by mouth daily.    Yes [provider]  NARCAN 4 MG/0.1ML LIQD nasal spray kit Place 1 spray into the nose as needed (For accidental opoid overdose).  09/05/17  Yes [provider]  omega-3 acid ethyl esters (LOVAZA) 1 g capsule Take 2 g by mouth 2 (two) times daily.  06/01/17  Yes [provider]  oxyCODONE (OXYCONTIN) 40 mg 12 hr tablet Take 1 tablet (40 mg total) by mouth every 12 (twelve) hours. 05/16/19  Yes Ennever, Rudell Cobb, MD  Oxycodone HCl 20 MG TABS Take 1 tablet (20 mg total) by mouth every 8 (eight) hours as needed. 05/16/19  Yes Volanda Napoleon, MD  torsemide (DEMADEX) 20 MG tablet Take 5 tablets (100 mg total) by mouth 2 (two) times daily. 05/22/19  Yes Larey Dresser, MD    Allergies:     Allergies  Allergen Reactions  . Vancomycin Other (See Comments)    "red man" syndrome (when in large quantity)    Social History:   Social History   Socioeconomic History  . Marital status: Divorced    Spouse name: Not on file  . Number of children: 2  . Years of education: Not on file  . Highest education level: Not on file  Occupational History  . Occupation: disabled  Social Needs  . Financial resource strain: Not hard at all  . Food insecurity    Worry: Never true    Inability: Never true  . Transportation needs    Medical: No    Non-medical: No  Tobacco Use  . Smoking status: Former Smoker    Packs/day: 0.50    Years: 8.00    Pack years: 4.00    Types: Cigarettes    Start date: 09/26/1966    Quit date: 06/26/1989    Years since quitting: 29.9  . Smokeless tobacco: Never Used  . Tobacco comment: quit 38 years ago  Substance and Sexual Activity  . Alcohol use: No    Alcohol/week: 0.0 standard drinks  . Drug use: No  . Sexual activity: Not on file  Lifestyle  . Physical activity    Days per week: 0 days    Minutes per session: 0 min  . Stress: Not on file  Relationships  . Social Herbalist on phone: Three times a week    Gets together: Three times a week    Attends religious service: 1 to 4 times per year    Active member of club or organization: No    Attends meetings of clubs or organizations: Never    Relationship status: Divorced  . Intimate partner violence    Fear of current or ex partner: No    Emotionally abused: No    Physically abused: No    Forced sexual activity: No  Other Topics Concern  . Not on file  Social History Narrative  . Not on file    Family History:   The patient's family history includes Bradycardia  in his father; Cancer in his father.  ROS:  Please see the history of present illness.  All other ROS reviewed and negative.     Physical Exam/Data:   Vitals:   05/28/19 0727 05/28/19 1000 05/28/19 1200  05/28/19 1543  BP: 111/71   108/67  Pulse: (!) 59 (!) 51 (!) 48 70  Resp: 16 (!) 23 18 18   Temp:    97.8 F (36.6 C)  TempSrc:    Oral  SpO2: 91% 95% 92% (!) 88%  Weight:      Height:        Intake/Output Summary (Last 24 hours) at 05/28/2019 1548 Last data filed at 05/28/2019 1544 Gross per 24 hour  Intake 1320 ml  Output 2775 ml  Net -1455 ml   Last 3 Weights 05/28/2019 05/27/2019 05/25/2019  Weight (lbs) 436 lb 4.7 oz 438 lb 11.5 oz 439 lb 6 oz  Weight (kg) 197.9 kg 199 kg 199.3 kg    Body mass index is 60.85 kg/m.  General: Super morbidly obese WM in no acute distress. On [place] O2. Head: Normocephalic, atraumatic, sclera non-icteric, no xanthomas, nares are without discharge.  Neck: JVD moderately elevated. Lungs: Diffusely diminished BS throughout without obvious wheezes, rales, rhonchi. Breathing is unlabored. Heart: Distant heart sounds with irregularly irregular rhythm. No murmurs, rubs, or gallops appreciated. Abdomen: Morbidly obese pannus without palpable liver border, no tenderness. No rebound/guarding. No obvious abdominal masses. + BS Msk:  Strength and tone appear normal for age. Extremities: Severe diffuse edema superimposed on large baseline leg habitus, appears to be anasarca/lymphedema with pitting all the way up to thighs and abdomen. Pedal pulses unable to be palpated given severe edema. Neuro: Alert and oriented X 3. No facial asymmetry. No focal deficit. Moves all extremities spontaneously. Psych:  Responds to questions appropriately with a normal albeit frustrated affect.  EKG:  The EKG was personally reviewed and demonstrates Atrial fib 68bpm, nonspecific STT changes  Relevant CV Studies: Most recent pertinent cardiac studies are outlined above.   Laboratory Data:  Chemistry Recent Labs  Lab 05/26/19 0218 05/27/19 0212 05/28/19 0303  NA 140 139 138  K 3.4* 3.5 3.5  CL 103 100 102  CO2 27 27 26   GLUCOSE 92 85 119*  BUN 32* 28* 27*  CREATININE  1.72* 1.43* 1.27*  CALCIUM 8.6* 8.7* 8.6*  GFRNONAA 41* 51* 59*  GFRAA 47* 59* >60  ANIONGAP 10 12 10     Recent Labs  Lab 05/25/19 1720 05/27/19 0212 05/28/19 0303  PROT 5.5*  --   --   ALBUMIN 2.9* 2.9* 2.8*  AST 26  --   --   ALT 26  --   --   ALKPHOS 69  --   --   BILITOT 1.0  --   --    Hematology Recent Labs  Lab 05/25/19 1638 05/26/19 0218  WBC 7.1 7.6  RBC 3.80* 3.72*  HGB 12.6* 12.3*  HCT 40.7 38.9*  MCV 107.1* 104.6*  MCH 33.2 33.1  MCHC 31.0 31.6  RDW 20.1* 20.0*  PLT 114* 111*    BNP Recent Labs  Lab 05/25/19 1720  BNP 740.5*    DDimer No results for input(s): DDIMER in the last 168 hours.  Radiology/Studies:  Dg Chest 2 View  Result Date: 05/25/2019 CLINICAL DATA:  Shortness of breath EXAM: CHEST - 2 VIEW COMPARISON:  12/23/2018 FINDINGS: Enlargement of cardiac silhouette with pulmonary vascular congestion. Mediastinal contours unchanged. Minimal interstitial edema. No pleural effusion or pneumothorax.  Bones demineralized. IMPRESSION: Enlargement of cardiac silhouette with pulmonary vascular congestion and minimal pulmonary edema. Electronically Signed   By: Lavonia Dana M.D.   On: 05/25/2019 17:11    Assessment and Plan:   1. Acute on chronic diastolic CHF/right heart failure complicated by cirrhosis and CKD - presented 61lb above baseline weight, with recent med adjustments due to AKI on CKD. No longer on spironolactone for unclear period of time, suspect this was stopped in setting of Cr >2. Cr has improved to 1.2 range. He previously required Lasix gtt at 70m/hr + high dose spironolactone in 12/2018 to achieve substantial diuresis. Will discuss plan with MD. Pt reports compliance with CPAP at home. Long term needs substantial weight loss or prognosis will remain poor.  2. Acute on chronic respiratory failure with intermittent hypoxia - likely due to #1.  3. Super morbid obesity with OHS/OSA - consider [facility]  for closer accountability at [place]  4. CKD stage III - Cr actually improving with diuresis, continue to follow with anticipated med change.  5. Chronic atrial fibrillation - HR is controlled. Continue Eliquis - although there has been previous question of switching to coumadin given his severe obesity to be able to adequately monitor therapeutic response. Was on nadolol as an outpatient; I cannot really find another indication for this med. It is on hold at this time. Will review with MD.  For questions or updates, please contact [place] consult [email or web address] for contact info under Cardiology/STEMI.  Signed, DCharlie Pitter PA-C  05/28/2019 3:48 PM  Patient seen and examined with the above-signed Advanced Practice Provider and/or Housestaff. I personally reviewed laboratory data, imaging studies and relevant notes. I independently examined the patient and formulated the important aspects of the plan. I have edited the note to reflect any of my changes or salient points. I have personally discussed the plan with the patient and/or family.  63y/o morbidly obese male with acute on chronic diastolic CHF/right heart failure complicated by cirrhosis, chronic AFand CKD now admitted with massive volume overload ~ 60 pounds with R>>>L symptoms in setting of recent decrease in diuretics due to AKI. Is responding somewhat to bolus IV lasix but feels gtt works better. Remains in chronic AF with rate control. No bleeding on Eliquis. Now having loose stools.   On exam Obese male sitting in chair JVP to ear IRR + 2/6 TR with loud P2 Lungs: decreased throughout. Dull at bases Ab: markedly obese NT Ext: 3-4+ edema into thighs with weeping on top of severe lymphedema + condom cath  He has massive volume overload. Improving on lasix 60 IV bid but some what sluggishly.  Increase lasix to 80 IV tid. Can add metolazone as needed. Will place PICC and can switch to lasix gtt as needed. Wrap legs. AF well  controlled. Continue Eliquis for now but with severe obesity (199kg Body mass index is 60.85 kg/m.) may need to consider switch to warfarin. Creatinine improving already with diuresis. Supp K and start spiro while in house.   DGlori Bickers MD  5:51 PM

## 2019-05-28 NOTE — Progress Notes (Signed)
Humidity added to [place]  due to nose bleed.  Distilled H20 added to home CPAP unit also.

## 2019-05-28 NOTE — Discharge Instructions (Signed)

## 2019-05-29 ENCOUNTER — Inpatient Hospital Stay (HOSPITAL_COMMUNITY): Payer: Medicare Other

## 2019-05-29 DIAGNOSIS — I482 Chronic atrial fibrillation, unspecified: Secondary | ICD-10-CM

## 2019-05-29 LAB — CBC
HCT: 40.3 % (ref 39.0–52.0)
Hemoglobin: 12.5 g/dL — ABNORMAL LOW (ref 13.0–17.0)
MCH: 32.9 pg (ref 26.0–34.0)
MCHC: 31 g/dL (ref 30.0–36.0)
MCV: 106.1 fL — ABNORMAL HIGH (ref 80.0–100.0)
Platelets: 93 10*3/uL — ABNORMAL LOW (ref 150–400)
RBC: 3.8 MIL/uL — ABNORMAL LOW (ref 4.22–5.81)
RDW: 19.7 % — ABNORMAL HIGH (ref 11.5–15.5)
WBC: 6.3 10*3/uL (ref 4.0–10.5)
nRBC: 0 % (ref 0.0–0.2)

## 2019-05-29 LAB — MAGNESIUM: Magnesium: 2.5 mg/dL — ABNORMAL HIGH (ref 1.7–2.4)

## 2019-05-29 LAB — BASIC METABOLIC PANEL
Anion gap: 12 (ref 5–15)
BUN: 21 mg/dL (ref 8–23)
CO2: 25 mmol/L (ref 22–32)
Calcium: 8.6 mg/dL — ABNORMAL LOW (ref 8.9–10.3)
Chloride: 104 mmol/L (ref 98–111)
Creatinine, Ser: 1.28 mg/dL — ABNORMAL HIGH (ref 0.61–1.24)
GFR calc Af Amer: >60 mL/min (ref >60)
GFR calc non Af Amer: 58 mL/min — ABNORMAL LOW (ref >60)
Glucose, Bld: 78 mg/dL (ref 70–99)
Potassium: 3.2 mmol/L — ABNORMAL LOW (ref 3.5–5.1)
Sodium: 141 mmol/L (ref 135–145)

## 2019-05-29 LAB — BRAIN NATRIURETIC PEPTIDE: B Natriuretic Peptide: 413.4 pg/mL — ABNORMAL HIGH (ref 0.0–100.0)

## 2019-05-29 MED ORDER — POTASSIUM CHLORIDE CRYS ER 20 MEQ PO TBCR
40.0000 meq | EXTENDED_RELEASE_TABLET | Freq: Once | ORAL | Status: AC
  Administered 2019-05-29: 40 meq via ORAL
  Filled 2019-05-29: qty 2

## 2019-05-29 MED ORDER — SPIRONOLACTONE 25 MG PO TABS
25.0000 mg | ORAL_TABLET | Freq: Every day | ORAL | Status: DC
  Administered 2019-05-30 – 2019-06-09 (×11): 25 mg via ORAL
  Filled 2019-05-29 (×11): qty 1

## 2019-05-29 MED ORDER — LACTULOSE 10 GM/15ML PO SOLN
10.0000 g | Freq: Every day | ORAL | Status: DC | PRN
  Administered 2019-06-03 – 2019-06-08 (×3): 10 g via ORAL
  Filled 2019-05-29 (×3): qty 15

## 2019-05-29 MED ORDER — SODIUM CHLORIDE 0.9% FLUSH
10.0000 mL | Freq: Two times a day (BID) | INTRAVENOUS | Status: DC
  Administered 2019-05-29 – 2019-06-01 (×7): 10 mL

## 2019-05-29 MED ORDER — METOLAZONE 2.5 MG PO TABS
2.5000 mg | ORAL_TABLET | Freq: Once | ORAL | Status: AC
  Administered 2019-05-29: 2.5 mg via ORAL
  Filled 2019-05-29: qty 1

## 2019-05-29 MED ORDER — SODIUM CHLORIDE 0.9% FLUSH: 10.0000 mL | INTRAVENOUS | Status: DC | PRN

## 2019-05-29 NOTE — Progress Notes (Signed)
Peripherally Inserted Central Catheter/Midline Placement  The IV Nurse has discussed with the patient and/or persons authorized to consent for the patient, the purpose of this procedure and the potential benefits and risks involved with this procedure.  The benefits include less needle sticks, lab draws from the catheter, and the patient may be discharged home with the catheter. Risks include, but not limited to, infection, bleeding, blood clot (thrombus formation), and puncture of an artery; nerve damage and irregular heartbeat and possibility to perform a PICC exchange if needed/ordered by physician.  Alternatives to this procedure were also discussed.  Bard Power PICC patient education guide, fact sheet on infection prevention and patient information card has been provided to patient /or left at bedside.    PICC/Midline Placement Documentation  PICC Triple Lumen 05/29/19 PICC Right Cephalic 50 cm 0 cm (Active)  Indication for Insertion or Continuance of Line Vasoactive infusions 05/29/19 1555  Exposed Catheter (cm) 0 cm 05/29/19 1555  Site Assessment Clean;Intact;Dry 05/29/19 1555  Lumen #1 Status Flushed;Saline locked;Blood return noted 05/29/19 1555  Lumen #2 Status Flushed;Saline locked;Blood return noted 05/29/19 1555  Lumen #3 Status Flushed;Saline locked;Blood return noted 05/29/19 1555  Dressing Type Transparent;Gauze;Occlusive 05/29/19 1555  Dressing Status Clean;Dry;Intact;Antimicrobial disc in place 05/29/19 1555  Dressing Change Due 06/05/19 05/29/19 1555       Gordan Payment 05/29/2019, 3:56 PM

## 2019-05-29 NOTE — Progress Notes (Signed)
Visit made to patients room to help with home CPAP.  Patient said not ready to put on at this time and will have RN call if he needs help with CPAP.

## 2019-05-29 NOTE — Progress Notes (Addendum)
PROGRESS NOTE  Albert James TTS:[id number] DOB: July 01, 1956 DOA: 05/25/2019 PCP: Mayra Neer, MD   LOS: 4 days   Brief narrative: Patient is a 63 year old Caucasian male, morbidly obese, with past medical history significant for chronic diastolic CHF, EF 09-23%, severe RV dysfunction and enlargement, severe pulmonary hypertension with PA peak pressure of 89 mmHg as per last echo, severe right atrium enlargement and tricuspid regurgitation, COPD, chronic respiratory failure with hypoxia usually on 2 L O2 via [place],atrial fibrillation on Eliquis, cirrhosis, leukocytopenia, CKD stage III, history of follicular small cell non-Hodgkin's lymphoma in remission, hypothyroidism, and OSA/OHS on CPAP.  Patient presented with worsening shortness of breath and 60 pound weight gain over the preceding 1 month.  Apparently, patient's primary care provider may have decreased the dose of his diuretics due to worsening renal function.  Patient is currently on IV Lasix 60 mg twice daily, with improvement in renal function noted (indicative of likely cardiorenal syndrome).   Subjective: Patient states that he feels little better today.  Has been seen by heart failure team.  Denies any nausea vomiting chest pain palpitation.    Assessment/Plan:  Principal Problem:   Acute on chronic diastolic (congestive) heart failure (HCC) Active Problems:   Chronic atrial fibrillation   Cirrhosis (HCC)   Pulmonary hypertension (HCC)   Thrombocytopenia (HCC)   OSA on CPAP   COPD (chronic obstructive pulmonary disease) (HCC)   Chronic respiratory failure with hypoxia (HCC)  Acute on chronic diastolic CHF exacerbation/severe pulmonary hypertension: -Documented 60-65 pound weight gain since 03/06/2019.  Patient has been seen by cardiology and heart failure team.  Will follow recommendations on diuresis with  severe pulmonary hypertension and RV dysfunction.  Repeat 2D echocardiogram reviewed with ejection fraction of 60  to 65% with increased right ventricular systolic pressure of 96. We will continue strict intake and output charting daily weights.  Currently, getting Lasix 80 mg IV 3 times daily   Acute on chronic hypoxic respiratory failure.  We will continue to wean oxygen as possible.  Could be multifactorial from diastolic heart failure super morbid obesity with obesity hypoventilation syndrome/OSA.   Bilateral lower extremity gross edema/lymphedema/anasarca.  Secondary to heart failure and venous insufficiency.  On wrap.  Continue with diuresis.  Chronic atrial fibrillation: On Eliquis, Nadolol was kept on hold due to low blood pressure and bradycardia.  Will need to closely monitor for any evidence of bleeding  COPD/chronic respiratory failure with hypoxia: -We will continue oxygen, nebulizer.  BiPAP PRN.    Hepaticcirrhosis: Suspected NASH versus congestive hepatopathy from RV failure. Continue Lasix and lactulose.  CKD stage III: Creatininie levels improving with diuresis.  Will closely monitor BMP.  Thrombocytopenia: Closely monitor platelets.  No evidence of bleeding.  Check CBC in a.m.  Mild hypokalemia.  Will replace orally.  Check BMP in a.m.  Closely on spironolactone and diuretics..  Hypothyroidism: -Continue Synthroid,TSH is 2.298.  OSA/OHS super morbid obesity.: Continue CPAP at night.  Will need to follow-up as outpatient for weight loss.  Chronic pain: On OxyContin 40 mg every 12 hours and oxycodone 20 mg as needed every 8 hours as an outpatient. [place] controlled substance database with last prescriptionswritten 05/16/2019. -Continue home meds as needed with hold parameters  VTE Prophylaxis: Eliquis  Code Status: Full code   Family Communication: None  Disposition Plan: Likely home with home health in 3 to 4 days.  Pending clinical progress.  Follow heart failure team recommendations on discharge.    Consultants:  Cardiology  Procedures:  2D echocardiogram   Antibiotics: Anti-infectives (From admission, onward)   None      Objective: Vitals:   05/29/19 0731 05/29/19 1055  BP: 106/70 108/67  Pulse: 74 76  Resp: 20 17  Temp: 98.2 F (36.8 C)   SpO2: 96% 90%    Intake/Output Summary (Last 24 hours) at 05/29/2019 1117 Last data filed at 05/29/2019 1109 Gross per 24 hour  Intake 1080 ml  Output 4200 ml  Net -3120 ml   Filed Weights   05/27/19 0300 05/28/19 0000 05/29/19 0500  Weight: (!) 199 kg (!) 197.9 kg (!) 197.1 kg   Body mass index is 60.6 kg/m.   Physical Exam: General: Morbidly obese.  Not in obvious distress.  On supplemental oxygen. HENT: Normocephalic, pupils equally reacting to light and accommodation.  No scleral pallor or icterus noted. Oral mucosa is moist.  Chest:    Diminished breath sounds bilaterally.  No obvious crackles or wheezes noted CVS: S1 &S2 heard. No murmur.  Regularly irregular rhythm. Abdomen: Soft, nontender, nondistended.  Bowel sounds are heard.  Extremities: No cyanosis, clubbing lateral lower extremity trace pitting edema with signs of venous insufficiency.  Peripheral pulses are palpable. Psych: Alert, awake and oriented, normal mood CNS:  No cranial nerve deficits.  Power equal in all extremities.  No sensory deficits noted.  No cerebellar signs.   Skin: Warm and dry.  Bilateral lower extremity edema/lymphedema anasarca.  Data Review: I have personally reviewed the following laboratory data and studies,  CBC: Recent Labs  Lab 05/25/19 1638 05/26/19 0218 05/29/19 0433  WBC 7.1 7.6 6.3  HGB 12.6* 12.3* 12.5*  HCT 40.7 38.9* 40.3  MCV 107.1* 104.6* 106.1*  PLT 114* 111* 93*   Basic Metabolic Panel: Recent Labs  Lab 05/25/19 1638 05/26/19 0218 05/27/19 0212 05/28/19 0303 05/29/19 0433  NA 135 140 139 138 141  K 3.9 3.4* 3.5 3.5 3.2*  CL 102 103 100 102 104  CO2 21* 27 27 26 25   GLUCOSE 112* 92 85 119* 78  BUN 35* 32* 28* 27* 21  CREATININE 1.80* 1.72* 1.43* 1.27* 1.28*   CALCIUM 8.5* 8.6* 8.7* 8.6* 8.6*  MG  --  2.4 2.3 2.3 2.5*  PHOS  --   --  3.4 3.3  --    Liver Function Tests: Recent Labs  Lab 05/25/19 1720 05/27/19 0212 05/28/19 0303  AST 26  --   --   ALT 26  --   --   ALKPHOS 69  --   --   BILITOT 1.0  --   --   PROT 5.5*  --   --   ALBUMIN 2.9* 2.9* 2.8*   No results for input(s): LIPASE, AMYLASE in the last 168 hours. No results for input(s): AMMONIA in the last 168 hours. Cardiac Enzymes: No results for input(s): CKTOTAL, CKMB, CKMBINDEX, TROPONINI in the last 168 hours. BNP (last 3 results) Recent Labs    12/23/18 0004 05/25/19 1720  BNP 1,221.0* 740.5*    ProBNP (last 3 results) No results for input(s): PROBNP in the last 8760 hours.  CBG: No results for input(s): GLUCAP in the last 168 hours. Recent Results (from the past 240 hour(s))  SARS Coronavirus 2 (CEPHEID- Performed in [place] hospital lab), Hosp Order     Status: None   Collection Time: 05/25/19  7:41 PM   Specimen: Nasopharyngeal Swab  Result Value Ref Range Status   SARS Coronavirus 2 NEGATIVE NEGATIVE Final    Comment: (  NOTE) If result is NEGATIVE SARS-CoV-2 target nucleic acids are NOT DETECTED. The SARS-CoV-2 RNA is generally detectable in upper and lower  respiratory specimens during the acute phase of infection. The lowest  concentration of SARS-CoV-2 viral copies this assay can detect is 250  copies / mL. A negative result does not preclude SARS-CoV-2 infection  and should not be used as the sole basis for treatment or other  patient management decisions.  A negative result may occur with  improper specimen collection / handling, submission of specimen other  than nasopharyngeal swab, presence of viral mutation(s) within the  areas targeted by this assay, and inadequate number of viral copies  (<250 copies / mL). A negative result must be combined with clinical  observations, patient history, and epidemiological information. If result is  POSITIVE SARS-CoV-2 target nucleic acids are DETECTED. The SARS-CoV-2 RNA is generally detectable in upper and lower  respiratory specimens dur ing the acute phase of infection.  Positive  results are indicative of active infection with SARS-CoV-2.  Clinical  correlation with patient history and other diagnostic information is  necessary to determine patient infection status.  Positive results do  not rule out bacterial infection or co-infection with other viruses. If result is PRESUMPTIVE POSTIVE SARS-CoV-2 nucleic acids MAY BE PRESENT.   A presumptive positive result was obtained on the submitted specimen  and confirmed on repeat testing.  While 2019 novel coronavirus  (SARS-CoV-2) nucleic acids may be present in the submitted sample  additional confirmatory testing may be necessary for epidemiological  and / or clinical management purposes  to differentiate between  SARS-CoV-2 and other Sarbecovirus currently known to infect humans.  If clinically indicated additional testing with an alternate test  methodology [phone]) is advised. The SARS-CoV-2 RNA is generally  detectable in upper and lower respiratory sp ecimens during the acute  phase of infection. The expected result is Negative. Fact Sheet for Patients:  StrictlyIdeas.no Fact Sheet for Healthcare Providers: BankingDealers.co.za This test is not yet approved or cleared by the [place] FDA and has been authorized for detection and/or diagnosis of SARS-CoV-2 by FDA under an Emergency Use Authorization (EUA).  This EUA will remain in effect (meaning this test can be used) for the duration of the COVID-19 declaration under Section 564(b)(1) of the Act, 21 U.S.C. section 360bbb-3(b)(1), unless the authorization is terminated or revoked sooner. Performed at [facility], [place] [address], [place], [place] [address]   MRSA PCR Screening     Status: Abnormal   Collection  Time: 05/25/19 10:25 PM   Specimen: Nasal Mucosa; Nasopharyngeal  Result Value Ref Range Status   MRSA by PCR POSITIVE (A) NEGATIVE Final    Comment:        The GeneXpert MRSA Assay (FDA approved for NASAL specimens only), is one component of a comprehensive MRSA colonization surveillance program. It is not intended to diagnose MRSA infection nor to guide or monitor treatment for MRSA infections. RESULT CALLED TO, READ BACK BY AND VERIFIED WITH: Jani Gravel RN 05/26/19 0032 JDW Performed at [facility], [place] [address] [place],  [address]      Studies: [place] Ekg Site Rite  Result Date: 05/28/2019 If [facility] image not attached, placement could not be confirmed due to current cardiac rhythm.   Scheduled Meds: . apixaban  5 mg Oral BID  . Chlorhexidine Gluconate Cloth  6 each Topical Q0600  . fluticasone  2 spray Each Nare QHS  . furosemide  80 mg Intravenous TID AC  . hydrocerin   Topical Daily  . levothyroxine  125 mcg Oral QAC breakfast  . metolazone  2.5 mg Oral Once  . mupirocin ointment  1 application Nasal BID  . oxyCODONE  40 mg Oral Q12H  . potassium chloride  40 mEq Oral BID  . potassium chloride  40 mEq Oral Once  . sodium chloride flush  3 mL Intravenous Once  . sodium chloride flush  3 mL Intravenous Q12H  . [START ON 05/30/2019] spironolactone  25 mg Oral Daily    Continuous Infusions:   Flora Lipps, MD  [facility] 05/29/2019

## 2019-05-29 NOTE — Progress Notes (Signed)
Eucerin cream applied on both legs wrapped with kerlix and ace bandage.

## 2019-05-29 NOTE — Progress Notes (Signed)
Patient ID: Albert James, male   DOB: 02-Jul-1956, 63 y.o.   MRN: [id number]     Advanced Heart Failure Rounding Note  PCP-Cardiologist: [name], MD   Subjective:    Still with dyspnea and orthopnea.  Good diuresis yesterday, weight down 2 lbs. Creatinine stable at 1.28.  Echo (7/20): EF 60-65%, moderate LVH, moderately decreased RV systolic function with moderate RV dilation and D-shaped septum, severe TR, PASP 96 mmHg.    Objective:   Weight Range: (!) 197.1 kg Body mass index is 60.6 kg/m.   Vital Signs:   Temp:  [97.6 F (36.4 C)-98.2 F (36.8 C)] 98.2 F (36.8 C) (07/14 0731) Pulse Rate:  [48-98] 74 (07/14 0731) Resp:  [18-24] 20 (07/14 0731) BP: (104-114)/(65-71) 106/70 (07/14 0731) SpO2:  [88 %-96 %] 96 % (07/14 0731) Weight:  [197.1 kg] 197.1 kg (07/14 0500) Last BM Date: 05/28/19  Weight change: Filed Weights   05/27/19 0300 05/28/19 0000 05/29/19 0500  Weight: (!) 199 kg (!) 197.9 kg (!) 197.1 kg    Intake/Output:   Intake/Output Summary (Last 24 hours) at 05/29/2019 0952 Last data filed at 05/29/2019 0130 Gross per 24 hour  Intake 1080 ml  Output 4150 ml  Net -3070 ml      Physical Exam    General:  Morbidly obese HEENT: Normal Neck: Supple. JVP 16 cm. Carotids 2+ bilat; no bruits. No lymphadenopathy or thyromegaly appreciated. Cor: PMI nonpalpable. Irregular rate & rhythm. No rubs, gallops. 2/6 HSM LLSB.  Lungs: Clear Abdomen: Soft, nontender, nondistended. No hepatosplenomegaly. No bruits or masses. Good bowel sounds. Extremities: No cyanosis, clubbing, rash. 2+ edema to thighs.  Neuro: Alert & orientedx3, cranial nerves grossly intact. moves all 4 extremities w/o difficulty. Affect pleasant   Telemetry   Atrial fibrillation rate 80s (personally reviewed).   Labs    CBC Recent Labs    05/29/19 0433  WBC 6.3  HGB 12.5*  HCT 40.3  MCV 106.1*  PLT 93*   Basic Metabolic Panel Recent Labs    05/27/19 0212 05/28/19 0303  05/29/19 0433  NA 139 138 141  K 3.5 3.5 3.2*  CL 100 102 104  CO2 27 26 25   GLUCOSE 85 119* 78  BUN 28* 27* 21  CREATININE 1.43* 1.27* 1.28*  CALCIUM 8.7* 8.6* 8.6*  MG 2.3 2.3 2.5*  PHOS 3.4 3.3  --    Liver Function Tests Recent Labs    05/27/19 0212 05/28/19 0303  ALBUMIN 2.9* 2.8*   No results for input(s): LIPASE, AMYLASE in the last 72 hours. Cardiac Enzymes No results for input(s): CKTOTAL, CKMB, CKMBINDEX, TROPONINI in the last 72 hours.  BNP: BNP (last 3 results) Recent Labs    12/23/18 0004 05/25/19 1720  BNP 1,221.0* 740.5*    ProBNP (last 3 results) No results for input(s): PROBNP in the last 8760 hours.   D-Dimer No results for input(s): DDIMER in the last 72 hours. Hemoglobin A1C No results for input(s): HGBA1C in the last 72 hours. Fasting Lipid Panel No results for input(s): CHOL, HDL, LDLCALC, TRIG, CHOLHDL, LDLDIRECT in the last 72 hours. Thyroid Function Tests No results for input(s): TSH, T4TOTAL, T3FREE, THYROIDAB in the last 72 hours.  Invalid input(s): FREET3  Other results:   Imaging    [place] Ekg Site Rite  Result Date: 05/28/2019 If Site Rite image not attached, placement could not be confirmed due to current cardiac rhythm.     Medications:     Scheduled Medications: . apixaban  5 mg Oral BID  . Chlorhexidine Gluconate Cloth  6 each Topical Q0600  . fluticasone  2 spray Each Nare QHS  . furosemide  80 mg Intravenous TID AC  . hydrocerin   Topical Daily  . lactulose  10 g Oral Daily  . levothyroxine  125 mcg Oral QAC breakfast  . metolazone  2.5 mg Oral Once  . mupirocin ointment  1 application Nasal BID  . oxyCODONE  40 mg Oral Q12H  . potassium chloride  40 mEq Oral BID  . potassium chloride  40 mEq Oral Once  . sodium chloride flush  3 mL Intravenous Once  . sodium chloride flush  3 mL Intravenous Q12H  . [START ON 05/30/2019] spironolactone  25 mg Oral Daily     Infusions:   PRN Medications:   acetaminophen **OR** acetaminophen, albuterol, oxyCODONE   Assessment/Plan   1. Acute on chronic diastolic CHF with prominent RV failure: Echo most remarkable for pulmonary hypertension, severe TR, and RV failure (7/20). Suspect RV failure is triggered by pulmonary hypertension. Underlying OHS/OSA and severe COPD have likely caused pulmonary hypertension/RV failure. Diuretics had been cut back some at home with rising creatinine, leading to worsening volume overload.  He was admitted about 60 lbs up.  On exam today, he remains markedly volume overloaded.  He is diuresing well and creatinine has come down.  - Continue Lasix 80 mg IV every 8 hrs today and will give a dose of metolazone with next Lasix.  - Increase spironolactone to 25 mg daily.  - Replace K.  - Continue UNNA boots.  2. Pulmonary hypertension: Severe by echo and RHC (pulmonary arterial hypertension).  Suspect group 3 disease due to COPD (severe obstruction on PFTs) and OSA/OHS.  V/Q scan not suggestive of chronic PEs.  He is on home oxygen and CPAP.  - Most likely would not benefit significantly from pulmonary vasodilators.  3. OHS/OSA: Continue chronic O2 and nightly CPAP.   4. Cirrhosis: Suspected NASH versus congestive hepatopathy from RV failure.  Denies ETOH. Cannot totally rule out component of portopulmonary hypertension.  5. Atrial fibrillation: Chronic, suspect permanent (present since at least 2015). Rate is reasonably controlled. - Continue Eliquis 5 mg BID.   6. Super Morbid Obesity: Needs aggressive work on diet for weight loss.   7. CKD: Stage 3, creatinine down to 1.2 with diuresis.   Length of Stay: [place], MD  05/29/2019, 9:52 AM  Advanced Heart Failure Team Pager [phone] (M-F; 7a - 4p)  Please contact [facility] for night-coverage after hours (4p -7a ) and weekends on [email or web address]

## 2019-05-30 DIAGNOSIS — K7460 Unspecified cirrhosis of liver: Secondary | ICD-10-CM

## 2019-05-30 DIAGNOSIS — M549 Dorsalgia, unspecified: Secondary | ICD-10-CM

## 2019-05-30 DIAGNOSIS — E039 Hypothyroidism, unspecified: Secondary | ICD-10-CM

## 2019-05-30 DIAGNOSIS — G8929 Other chronic pain: Secondary | ICD-10-CM

## 2019-05-30 DIAGNOSIS — I50811 Acute right heart failure: Secondary | ICD-10-CM

## 2019-05-30 DIAGNOSIS — E876 Hypokalemia: Secondary | ICD-10-CM | POA: Diagnosis present

## 2019-05-30 DIAGNOSIS — J9601 Acute respiratory failure with hypoxia: Secondary | ICD-10-CM

## 2019-05-30 LAB — CBC
HCT: 41 % (ref 39.0–52.0)
Hemoglobin: 12.8 g/dL — ABNORMAL LOW (ref 13.0–17.0)
MCH: 33 pg (ref 26.0–34.0)
MCHC: 31.2 g/dL (ref 30.0–36.0)
MCV: 105.7 fL — ABNORMAL HIGH (ref 80.0–100.0)
Platelets: 88 10*3/uL — ABNORMAL LOW (ref 150–400)
RBC: 3.88 MIL/uL — ABNORMAL LOW (ref 4.22–5.81)
RDW: 19.8 % — ABNORMAL HIGH (ref 11.5–15.5)
WBC: 6.8 10*3/uL (ref 4.0–10.5)
nRBC: 0 % (ref 0.0–0.2)

## 2019-05-30 LAB — BASIC METABOLIC PANEL
Anion gap: 11 (ref 5–15)
BUN: 19 mg/dL (ref 8–23)
CO2: 26 mmol/L (ref 22–32)
Calcium: 8.5 mg/dL — ABNORMAL LOW (ref 8.9–10.3)
Chloride: 102 mmol/L (ref 98–111)
Creatinine, Ser: 1.31 mg/dL — ABNORMAL HIGH (ref 0.61–1.24)
GFR calc Af Amer: >60 mL/min (ref >60)
GFR calc non Af Amer: 57 mL/min — ABNORMAL LOW (ref >60)
Glucose, Bld: 129 mg/dL — ABNORMAL HIGH (ref 70–99)
Potassium: 3.2 mmol/L — ABNORMAL LOW (ref 3.5–5.1)
Sodium: 139 mmol/L (ref 135–145)

## 2019-05-30 LAB — MAGNESIUM: Magnesium: 2.2 mg/dL (ref 1.7–2.4)

## 2019-05-30 MED ORDER — NAPROXEN SODIUM 275 MG PO TABS
275.0000 mg | ORAL_TABLET | Freq: Two times a day (BID) | ORAL | Status: DC | PRN
  Administered 2019-05-30 – 2019-06-02 (×3): 275 mg via ORAL
  Filled 2019-05-30 (×5): qty 1

## 2019-05-30 MED ORDER — METOLAZONE 2.5 MG PO TABS
2.5000 mg | ORAL_TABLET | Freq: Once | ORAL | Status: AC
  Administered 2019-05-30: 2.5 mg via ORAL
  Filled 2019-05-30: qty 1

## 2019-05-30 MED ORDER — DOCUSATE SODIUM 100 MG PO CAPS
100.0000 mg | ORAL_CAPSULE | Freq: Two times a day (BID) | ORAL | Status: DC
  Administered 2019-05-31 – 2019-06-09 (×13): 100 mg via ORAL
  Filled 2019-05-30 (×18): qty 1

## 2019-05-30 MED ORDER — METOLAZONE 2.5 MG PO TABS: 2.5000 mg | ORAL_TABLET | Freq: Once | ORAL | Status: DC

## 2019-05-30 MED ORDER — POTASSIUM CHLORIDE CRYS ER 20 MEQ PO TBCR
60.0000 meq | EXTENDED_RELEASE_TABLET | ORAL | Status: AC
  Administered 2019-05-30 (×3): 60 meq via ORAL
  Filled 2019-05-30 (×3): qty 3

## 2019-05-30 NOTE — Progress Notes (Addendum)
Progress Note    Albert James  SPQ:[id number] DOB: 10/29/1956  DOA: 05/25/2019 PCP: Mayra Neer, MD    Brief Narrative:   Chief complaint: Follow-up heart failure.  Medical records reviewed and are as summarized below:  Albert James is an 63 y.o. male with a PMH of chronic diastolic CHF, severe RV dysfunction, severe pulmonary hypertension, COPD, chronic respiratory failure with hypoxia on 2 L of nasal cannula oxygen, atrial fibrillation on Eliquis, cirrhosis, leukopenia, stage III CKD, non-Hodgkin's lymphoma in remission, hypothyroidism, and OSA/OHS on nocturnal CPAP who was admitted 05/25/2019 for evaluation of worsening shortness of breath associated with weight gain despite attempts to manage as an outpatient with adjustment of home diuretic dosing.  Chest x-ray on admission showed cardiomegaly with pulmonary vascular congestion and he was given a dose of IV Lasix in the ED.  Assessment/Plan:   Principal Problem:   Acute on chronic diastolic (congestive) heart failure (HCC) in the setting of severe pulmonary hypertension/acute on chronic respiratory failure with hypoxia Admitted with decompensated heart failure and placed on IV diuretics.  Noted to have had a 60-65 pound weight gain over the past 3 months.  Evaluated by cardiology/heart failure team.  Repeat 2D echo performed 05/26/2019 and showed an EF of 60-65% with increased right ventricular systolic pressure of 96.  VQ scan not suggestive of chronic PE.  Has been managed with high-dose Lasix 80 mg IV 3 times daily as well as metolazone and spironolactone with gradual improvement of heart failure symptoms.  Chest x-ray repeated 05/29/2019 and personally reviewed and compared to images done 05/25/2019.  Per my review, patient continues to have vascular congestion but no evidence of pneumonia or infiltrate..  Weight down another 3 kg today.  Renal function stable with diuresis, continue to monitor very closely.  Oxygen  saturations ranging from 85-90%.  Remains massively volume overloaded on exam.  Continue aggressive diuresis and monitor CVP.  Active Problems:   Chronic/permanent atrial fibrillation Anticoagulated with Eliquis.  Nadolol held on admission secondary to hypotension and bradycardia.  Blood pressure/heart rate currently reasonable.    Cirrhosis ([place]) Suspected NASH versus congestive hepatopathy from right heart failure.  Managed with Lasix and lactulose.    Thrombocytopenia ([place]) Secondary to cirrhosis.  Monitor closely for signs of bleeding given concurrent use of Eliquis.    OSA on CPAP Continue CPAP at bedtime.    COPD (chronic obstructive pulmonary disease) (HCC) Continue supplemental oxygen and bronchodilators.    Hypothyroidism Continue Synthroid, TSH WNL.    Hypokalemia Secondary to diuretics.  Monitor and replace as needed.  Receiving 60 mEq 3 times today for repletion.    Chronic pain Continue OxyContin 40 mg every 12 hours and oxycodone 20 mg as needed every 8 hours for breakthrough pain.  [place] controlled substance database queried with last prescriptions written 05/16/2019.    Morbid obesity Body mass index is 59.8 kg/m.  Will request dietitian teaching for weight loss.   Family Communication/Anticipated D/C date and plan/Code Status   DVT prophylaxis: Eliquis ordered. Code Status: Full Code.  Family Communication: Daughter updated by telephone. Disposition Plan: Inpatient appropriate, not stable for d/c yet.  Will need to be further diuresed.   Medical Consultants:    Cardiology   Anti-Infectives:    None  Subjective:   [name] says he feels terrible today.  Continues to be short of breath and orthopneic.  No frank chest pain.  Poor energy and appetite.  Reports that he has  been running fevers as well.  Objective:    Vitals:   05/29/19 2259 05/30/19 0100 05/30/19 0500 05/30/19 0738  BP: (!) 136/91 125/73  132/86  Pulse: 87 87      Resp:  19    Temp: 98.5 F (36.9 C) 98.2 F (36.8 C)  99.8 F (37.7 C)  TempSrc:  Oral  Oral  SpO2: 90% 90%  (!) 85%  Weight:   (!) 194.5 kg   Height:        Intake/Output Summary (Last 24 hours) at 05/30/2019 0847 Last data filed at 05/30/2019 0744 Gross per 24 hour  Intake 355 ml  Output 5300 ml  Net -4945 ml   Filed Weights   05/28/19 0000 05/29/19 0500 05/30/19 0500  Weight: (!) 197.9 kg (!) 197.1 kg (!) 194.5 kg    Exam: General: Morbidly obese, uncomfortable appearing, sitting up in the chair with tremors to the left hand.  Appears weak. Cardiovascular: Heart sounds are irregularly irregular. No gallops or rubs. No murmurs.  JVP at 16 cm. Lungs: Diminished throughout with a few scattered wheezes.  Abdomen: Soft, nontender, mildly distended with decreased bowel sounds. No masses. No hepatosplenomegaly. Neurological: Alert and oriented 3. Moves all extremities 4 with equal strength. Cranial nerves II through XII grossly intact. Skin: Warm and dry. No rashes or lesions. Extremities: No clubbing or cyanosis.  Massive lower extremity edema with Unna boots intact.  Unable to palpate pedal pulses secondary to [employer] application. Psychiatric: Mood and affect are flat/depressed. Insight and judgment are fair.   Data Reviewed:   I have personally reviewed following labs and imaging studies:  Labs: Labs show the following:   Basic Metabolic Panel: Recent Labs  Lab 05/26/19 0218 05/27/19 0212 05/28/19 0303 05/29/19 0433 05/30/19 0500  NA 140 139 138 141 139  K 3.4* 3.5 3.5 3.2* 3.2*  CL 103 100 102 104 102  CO2 27 27 26 25 26   GLUCOSE 92 85 119* 78 129*  BUN 32* 28* 27* 21 19  CREATININE 1.72* 1.43* 1.27* 1.28* 1.31*  CALCIUM 8.6* 8.7* 8.6* 8.6* 8.5*  MG 2.4 2.3 2.3 2.5* 2.2  PHOS  --  3.4 3.3  --   --    GFR Estimated Creatinine Clearance: 97.8 mL/min (A) (by C-G formula based on SCr of 1.31 mg/dL (H)). Liver Function Tests: Recent Labs  Lab  05/25/19 1720 05/27/19 0212 05/28/19 0303  AST 26  --   --   ALT 26  --   --   ALKPHOS 69  --   --   BILITOT 1.0  --   --   PROT 5.5*  --   --   ALBUMIN 2.9* 2.9* 2.8*    CBC: Recent Labs  Lab 05/25/19 1638 05/26/19 0218 05/29/19 0433 05/30/19 0500  WBC 7.1 7.6 6.3 6.8  HGB 12.6* 12.3* 12.5* 12.8*  HCT 40.7 38.9* 40.3 41.0  MCV 107.1* 104.6* 106.1* 105.7*  PLT 114* 111* 93* 88*    Microbiology Recent Results (from the past 240 hour(s))  SARS Coronavirus 2 (CEPHEID- Performed in [place] hospital lab), Hosp Order     Status: None   Collection Time: 05/25/19  7:41 PM   Specimen: Nasopharyngeal Swab  Result Value Ref Range Status   SARS Coronavirus 2 NEGATIVE NEGATIVE Final    Comment: (NOTE) If result is NEGATIVE SARS-CoV-2 target nucleic acids are NOT DETECTED. The SARS-CoV-2 RNA is generally detectable in upper and lower  respiratory specimens during the acute phase  of infection. The lowest  concentration of SARS-CoV-2 viral copies this assay can detect is 250  copies / mL. A negative result does not preclude SARS-CoV-2 infection  and should not be used as the sole basis for treatment or other  patient management decisions.  A negative result may occur with  improper specimen collection / handling, submission of specimen other  than nasopharyngeal swab, presence of viral mutation(s) within the  areas targeted by this assay, and inadequate number of viral copies  (<250 copies / mL). A negative result must be combined with clinical  observations, patient history, and epidemiological information. If result is POSITIVE SARS-CoV-2 target nucleic acids are DETECTED. The SARS-CoV-2 RNA is generally detectable in upper and lower  respiratory specimens dur ing the acute phase of infection.  Positive  results are indicative of active infection with SARS-CoV-2.  Clinical  correlation with patient history and other diagnostic information is  necessary to determine patient  infection status.  Positive results do  not rule out bacterial infection or co-infection with other viruses. If result is PRESUMPTIVE POSTIVE SARS-CoV-2 nucleic acids MAY BE PRESENT.   A presumptive positive result was obtained on the submitted specimen  and confirmed on repeat testing.  While 2019 novel coronavirus  (SARS-CoV-2) nucleic acids may be present in the submitted sample  additional confirmatory testing may be necessary for epidemiological  and / or clinical management purposes  to differentiate between  SARS-CoV-2 and other Sarbecovirus currently known to infect humans.  If clinically indicated additional testing with an alternate test  methodology [phone]) is advised. The SARS-CoV-2 RNA is generally  detectable in upper and lower respiratory sp ecimens during the acute  phase of infection. The expected result is Negative. Fact Sheet for Patients:  StrictlyIdeas.no Fact Sheet for Healthcare Providers: BankingDealers.co.za This test is not yet approved or cleared by the [place] FDA and has been authorized for detection and/or diagnosis of SARS-CoV-2 by FDA under an Emergency Use Authorization (EUA).  This EUA will remain in effect (meaning this test can be used) for the duration of the COVID-19 declaration under Section 564(b)(1) of the Act, 21 U.S.C. section 360bbb-3(b)(1), unless the authorization is terminated or revoked sooner. Performed at [facility], [place] [address] [place], [place] [address]   MRSA PCR Screening     Status: Abnormal   Collection Time: 05/25/19 10:25 PM   Specimen: Nasal Mucosa; Nasopharyngeal  Result Value Ref Range Status   MRSA by PCR POSITIVE (A) NEGATIVE Final    Comment:        The GeneXpert MRSA Assay (FDA approved for NASAL specimens only), is one component of a comprehensive MRSA colonization surveillance program. It is not intended to diagnose MRSA infection nor to guide  or monitor treatment for MRSA infections. RESULT CALLED TO, READ BACK BY AND VERIFIED WITH: Jani Gravel RN 05/26/19 0032 JDW Performed at [facility], [place] [address] [place], [place] [address]     Procedures and diagnostic studies:  Dg Chest Port 1 View  Result Date: 05/29/2019 CLINICAL DATA:  Status post PICC line insertion EXAM: PORTABLE CHEST 1 VIEW COMPARISON:  05/25/2019 FINDINGS: Cardiac shadow remains enlarged. Right-sided PICC line is noted with the catheter tip distal superior vena cava. Mild central vascular congestion is noted. No focal infiltrate is seen. No bony abnormality is noted. IMPRESSION: PICC line as described in satisfactory position. Electronically Signed   By: Inez Catalina M.D.   On: 05/29/2019 16:05   [place]  Ekg Site Rite  Result Date: 05/28/2019 If Site Rite image not attached, placement could not be confirmed due to current cardiac rhythm.   Medications:    apixaban  5 mg Oral BID   Chlorhexidine Gluconate Cloth  6 each Topical Q0600   docusate sodium  100 mg Oral BID   fluticasone  2 spray Each Nare QHS   furosemide  80 mg Intravenous TID AC   hydrocerin   Topical Daily   levothyroxine  125 mcg Oral QAC breakfast   mupirocin ointment  1 application Nasal BID   oxyCODONE  40 mg Oral Q12H   potassium chloride  40 mEq Oral BID   sodium chloride flush  10-40 mL Intracatheter Q12H   sodium chloride flush  3 mL Intravenous Once   sodium chloride flush  3 mL Intravenous Q12H   spironolactone  25 mg Oral Daily   Continuous Infusions:   LOS: 5 days   Jacquelynn Cree  [facility] Pager [phone].   *Please refer to [email or web address], password [email or web address] to get updated schedule on who will round on this patient, as hospitalists switch teams weekly. If 7PM-7AM, please contact night-coverage at [email or web address], password [email or web address] for any overnight needs.  05/30/2019, 8:47 AM

## 2019-05-30 NOTE — Progress Notes (Signed)
Patient ID: Albert James, male   DOB: 01-15-1956, 63 y.o.   MRN: [id number]     Advanced Heart Failure Rounding Note  PCP-Cardiologist: [name], MD   Subjective:    Still with dyspnea and orthopnea, does not feel good.  Good diuresis yesterday, weight down 6 lbs. Creatinine stable at 1.3.  Tm 100.1 last night, WBCs normal.  CXR with no infiltrate on 7/14.   Echo (7/20): EF 60-65%, moderate LVH, moderately decreased RV systolic function with moderate RV dilation and D-shaped septum, severe TR, PASP 96 mmHg.    Objective:   Weight Range: (!) 194.5 kg Body mass index is 59.8 kg/m.   Vital Signs:   Temp:  [98.2 F (36.8 C)-100.1 F (37.8 C)] 99.8 F (37.7 C) (07/15 0738) Pulse Rate:  [76-112] 87 (07/15 0100) Resp:  [17-27] 19 (07/15 0100) BP: (108-139)/(67-91) 132/86 (07/15 0738) SpO2:  [85 %-92 %] 85 % (07/15 0738) Weight:  [194.5 kg] 194.5 kg (07/15 0500) Last BM Date: 05/29/19  Weight change: Filed Weights   05/28/19 0000 05/29/19 0500 05/30/19 0500  Weight: (!) 197.9 kg (!) 197.1 kg (!) 194.5 kg    Intake/Output:   Intake/Output Summary (Last 24 hours) at 05/30/2019 0854 Last data filed at 05/30/2019 0847 Gross per 24 hour  Intake 595 ml  Output 5300 ml  Net -4705 ml      Physical Exam    General: Morbidly obese.  Neck: JVP 16 cm, no thyromegaly or thyroid nodule.  Lungs: Decreased BS at bases.  CV: Nonpalpable PMI.  Heart irregular S1/S2, no S3/S4, no murmur.  2+ edema to thighs.   Abdomen: Soft, nontender, no hepatosplenomegaly, mild distention.  Skin: Intact without lesions or rashes.  Neurologic: Alert and oriented x 3.  Psych: Normal affect. Extremities: No clubbing or cyanosis.  HEENT: Normal.    Telemetry   Atrial fibrillation rate 80s-90s (personally reviewed).   Labs    CBC Recent Labs    05/29/19 0433 05/30/19 0500  WBC 6.3 6.8  HGB 12.5* 12.8*  HCT 40.3 41.0  MCV 106.1* 105.7*  PLT 93* 88*   Basic Metabolic Panel  Recent Labs    05/28/19 0303 05/29/19 0433 05/30/19 0500  NA 138 141 139  K 3.5 3.2* 3.2*  CL 102 104 102  CO2 26 25 26   GLUCOSE 119* 78 129*  BUN 27* 21 19  CREATININE 1.27* 1.28* 1.31*  CALCIUM 8.6* 8.6* 8.5*  MG 2.3 2.5* 2.2  PHOS 3.3  --   --    Liver Function Tests Recent Labs    05/28/19 0303  ALBUMIN 2.8*   No results for input(s): LIPASE, AMYLASE in the last 72 hours. Cardiac Enzymes No results for input(s): CKTOTAL, CKMB, CKMBINDEX, TROPONINI in the last 72 hours.  BNP: BNP (last 3 results) Recent Labs    12/23/18 0004 05/25/19 1720 05/29/19 0433  BNP 1,221.0* 740.5* 413.4*    ProBNP (last 3 results) No results for input(s): PROBNP in the last 8760 hours.   D-Dimer No results for input(s): DDIMER in the last 72 hours. Hemoglobin A1C No results for input(s): HGBA1C in the last 72 hours. Fasting Lipid Panel No results for input(s): CHOL, HDL, LDLCALC, TRIG, CHOLHDL, LDLDIRECT in the last 72 hours. Thyroid Function Tests No results for input(s): TSH, T4TOTAL, T3FREE, THYROIDAB in the last 72 hours.  Invalid input(s): FREET3  Other results:   Imaging    Dg Chest Port 1 View  Result Date: 05/29/2019 CLINICAL DATA:  Status  post PICC line insertion EXAM: PORTABLE CHEST 1 VIEW COMPARISON:  05/25/2019 FINDINGS: Cardiac shadow remains enlarged. Right-sided PICC line is noted with the catheter tip distal superior vena cava. Mild central vascular congestion is noted. No focal infiltrate is seen. No bony abnormality is noted. IMPRESSION: PICC line as described in satisfactory position. Electronically Signed   By: Inez Catalina M.D.   On: 05/29/2019 16:05     Medications:     Scheduled Medications: . apixaban  5 mg Oral BID  . Chlorhexidine Gluconate Cloth  6 each Topical Q0600  . docusate sodium  100 mg Oral BID  . fluticasone  2 spray Each Nare QHS  . furosemide  80 mg Intravenous TID AC  . hydrocerin   Topical Daily  . levothyroxine  125 mcg Oral  QAC breakfast  . metolazone  2.5 mg Oral Once  . mupirocin ointment  1 application Nasal BID  . oxyCODONE  40 mg Oral Q12H  . potassium chloride  60 mEq Oral Q4H  . sodium chloride flush  10-40 mL Intracatheter Q12H  . sodium chloride flush  3 mL Intravenous Once  . sodium chloride flush  3 mL Intravenous Q12H  . spironolactone  25 mg Oral Daily    Infusions:   PRN Medications: acetaminophen **OR** acetaminophen, albuterol, lactulose, naproxen sodium, oxyCODONE, sodium chloride flush   Assessment/Plan   1. Acute on chronic diastolic CHF with prominent RV failure: Echo most remarkable for pulmonary hypertension, severe TR, and RV failure (7/20). Suspect RV failure is triggered by pulmonary hypertension. Underlying OHS/OSA and severe COPD have likely caused pulmonary hypertension/RV failure. Diuretics had been cut back some at home with rising creatinine, leading to worsening volume overload.  He was admitted about 60 lbs up.  On exam today, he remains markedly volume overloaded.  He is diuresing well and creatinine remains stable.  He got a PICC line yesterday.  - Continue Lasix 80 mg IV every 8 hrs today and will give a dose of metolazone with next Lasix.  - Since there is a PICC in place, will follow CVP.  - Continue spironolactone 25 mg daily.  - Replace K aggressively.  - Continue UNNA boots.  2. Pulmonary hypertension: Severe by echo and RHC (pulmonary arterial hypertension).  Suspect group 3 disease due to COPD (severe obstruction on PFTs) and OSA/OHS.  V/Q scan not suggestive of chronic PEs.  He is on home oxygen and CPAP.  - Most likely would not benefit significantly from pulmonary vasodilators.  3. OHS/OSA: Continue chronic O2 and nightly CPAP.   4. Cirrhosis: Suspected NASH versus congestive hepatopathy from RV failure.  Denies ETOH. Cannot totally rule out component of portopulmonary hypertension.  5. Atrial fibrillation: Chronic, suspect permanent (present since at least  2015). Rate is reasonably controlled. - Continue Eliquis 5 mg BID.   6. Super Morbid Obesity: Needs aggressive work on diet for weight loss.   7. CKD: Stage 3, creatinine fairly stable at 1.3 with diuresis.   Length of Stay: [place], MD  05/30/2019, 8:54 AM  Advanced Heart Failure Team Pager [phone] (M-F; 7a - 4p)  Please contact [facility] for night-coverage after hours (4p -7a ) and weekends on [email or web address]

## 2019-05-31 LAB — CBC
HCT: 39.5 % (ref 39.0–52.0)
Hemoglobin: 12.3 g/dL — ABNORMAL LOW (ref 13.0–17.0)
MCH: 32.9 pg (ref 26.0–34.0)
MCHC: 31.1 g/dL (ref 30.0–36.0)
MCV: 105.6 fL — ABNORMAL HIGH (ref 80.0–100.0)
Platelets: 73 10*3/uL — ABNORMAL LOW (ref 150–400)
RBC: 3.74 MIL/uL — ABNORMAL LOW (ref 4.22–5.81)
RDW: 19 % — ABNORMAL HIGH (ref 11.5–15.5)
WBC: 7.5 10*3/uL (ref 4.0–10.5)
nRBC: 0 % (ref 0.0–0.2)

## 2019-05-31 LAB — BASIC METABOLIC PANEL
Anion gap: 11 (ref 5–15)
BUN: 24 mg/dL — ABNORMAL HIGH (ref 8–23)
CO2: 27 mmol/L (ref 22–32)
Calcium: 8.6 mg/dL — ABNORMAL LOW (ref 8.9–10.3)
Chloride: 99 mmol/L (ref 98–111)
Creatinine, Ser: 1.48 mg/dL — ABNORMAL HIGH (ref 0.61–1.24)
GFR calc Af Amer: 57 mL/min — ABNORMAL LOW (ref >60)
GFR calc non Af Amer: 49 mL/min — ABNORMAL LOW (ref >60)
Glucose, Bld: 121 mg/dL — ABNORMAL HIGH (ref 70–99)
Potassium: 3.9 mmol/L (ref 3.5–5.1)
Sodium: 137 mmol/L (ref 135–145)

## 2019-05-31 LAB — COOXEMETRY PANEL
Carboxyhemoglobin: 2.7 % — ABNORMAL HIGH (ref 0.5–1.5)
Methemoglobin: 1 % (ref 0.0–1.5)
O2 Saturation: 56.6 %
Total hemoglobin: 12.1 g/dL (ref 12.0–16.0)

## 2019-05-31 LAB — MAGNESIUM: Magnesium: 2.1 mg/dL (ref 1.7–2.4)

## 2019-05-31 MED ORDER — METOLAZONE 2.5 MG PO TABS
2.5000 mg | ORAL_TABLET | Freq: Once | ORAL | Status: AC
  Administered 2019-05-31: 2.5 mg via ORAL
  Filled 2019-05-31: qty 1

## 2019-05-31 MED ORDER — FUROSEMIDE 10 MG/ML IJ SOLN
15.0000 mg/h | INTRAVENOUS | Status: DC
  Administered 2019-05-31 – 2019-06-03 (×5): 15 mg/h via INTRAVENOUS
  Filled 2019-05-31 (×2): qty 25
  Filled 2019-05-31: qty 20
  Filled 2019-05-31 (×2): qty 25
  Filled 2019-05-31: qty 21
  Filled 2019-05-31: qty 10

## 2019-05-31 MED ORDER — POTASSIUM CHLORIDE CRYS ER 20 MEQ PO TBCR
60.0000 meq | EXTENDED_RELEASE_TABLET | ORAL | Status: AC
  Administered 2019-05-31 (×3): 60 meq via ORAL
  Filled 2019-05-31: qty 3

## 2019-05-31 NOTE — Progress Notes (Signed)
Patient ID: Albert James, male   DOB: [birth year], 63 y.o.   MRN: [id number]     Advanced Heart Failure Rounding Note  PCP-Cardiologist: [name], MD   Subjective:    Still dyspneic. Afebrile.  Weight down 2 lbs, UOP not quite as vigorous yesterday.  Creatinine 1.31 => 1.48. CVP 24-26.   Echo (7/20): EF 60-65%, moderate LVH, moderately decreased RV systolic function with moderate RV dilation and D-shaped septum, severe TR, PASP 96 mmHg.    Objective:   Weight Range: (!) 193.5 kg Body mass index is 59.5 kg/m.   Vital Signs:   Temp:  [97.7 F (36.5 C)-99.1 F (37.3 C)] 97.8 F (36.6 C) (07/16 0748) Pulse Rate:  [70-77] 74 (07/16 0748) Resp:  [17-20] 19 (07/16 0748) BP: (87-114)/(63-79) 114/79 (07/16 0748) SpO2:  [90 %-97 %] 95 % (07/16 0748) Weight:  [193.5 kg] 193.5 kg (07/16 0313) Last BM Date: 05/30/19  Weight change: Filed Weights   05/29/19 0500 05/30/19 0500 05/31/19 0313  Weight: (!) 197.1 kg (!) 194.5 kg (!) 193.5 kg    Intake/Output:   Intake/Output Summary (Last 24 hours) at 05/31/2019 0918 Last data filed at 05/30/2019 2055 Gross per 24 hour  Intake 730 ml  Output 2600 ml  Net -1870 ml      Physical Exam    General: NAD Neck: Thick, JVP 16+, no thyromegaly or thyroid nodule.  Lungs: Decreased BS at bases.  CV: Nonpalpable PMI.  Heart irregular S1/S2, no S3/S4, 2/6 HSM LLSB.  2+ edema to thighs, in Unna boots.   Abdomen: Soft, nontender, no hepatosplenomegaly, no distention.  Skin: Intact without lesions or rashes.  Neurologic: Alert and oriented x 3.  Psych: Normal affect. Extremities: No clubbing or cyanosis.  HEENT: Normal.    Telemetry   Atrial fibrillation rate 80s (personally reviewed).   Labs    CBC Recent Labs    05/30/19 0500 05/31/19 0359  WBC 6.8 7.5  HGB 12.8* 12.3*  HCT 41.0 39.5  MCV 105.7* 105.6*  PLT 88* 73*   Basic Metabolic Panel Recent Labs    05/30/19 0500 05/31/19 0359  NA 139 137  K 3.2* 3.9   CL 102 99  CO2 26 27  GLUCOSE 129* 121*  BUN 19 24*  CREATININE 1.31* 1.48*  CALCIUM 8.5* 8.6*  MG 2.2 2.1   Liver Function Tests No results for input(s): AST, ALT, ALKPHOS, BILITOT, PROT, ALBUMIN in the last 72 hours. No results for input(s): LIPASE, AMYLASE in the last 72 hours. Cardiac Enzymes No results for input(s): CKTOTAL, CKMB, CKMBINDEX, TROPONINI in the last 72 hours.  BNP: BNP (last 3 results) Recent Labs    12/23/18 0004 05/25/19 1720 05/29/19 0433  BNP 1,221.0* 740.5* 413.4*    ProBNP (last 3 results) No results for input(s): PROBNP in the last 8760 hours.   D-Dimer No results for input(s): DDIMER in the last 72 hours. Hemoglobin A1C No results for input(s): HGBA1C in the last 72 hours. Fasting Lipid Panel No results for input(s): CHOL, HDL, LDLCALC, TRIG, CHOLHDL, LDLDIRECT in the last 72 hours. Thyroid Function Tests No results for input(s): TSH, T4TOTAL, T3FREE, THYROIDAB in the last 72 hours.  Invalid input(s): FREET3  Other results:   Imaging    No results found.   Medications:     Scheduled Medications: . apixaban  5 mg Oral BID  . docusate sodium  100 mg Oral BID  . fluticasone  2 spray Each Nare QHS  . hydrocerin  Topical Daily  . levothyroxine  125 mcg Oral QAC breakfast  . metolazone  2.5 mg Oral Once  . oxyCODONE  40 mg Oral Q12H  . potassium chloride  60 mEq Oral Q4H  . sodium chloride flush  10-40 mL Intracatheter Q12H  . sodium chloride flush  3 mL Intravenous Once  . sodium chloride flush  3 mL Intravenous Q12H  . spironolactone  25 mg Oral Daily    Infusions: . furosemide (LASIX) infusion      PRN Medications: acetaminophen **OR** acetaminophen, albuterol, lactulose, naproxen sodium, oxyCODONE, sodium chloride flush   Assessment/Plan   1. Acute on chronic diastolic CHF with prominent RV failure: Echo most remarkable for pulmonary hypertension, severe TR, and RV failure (7/20). Suspect RV failure is triggered by  pulmonary hypertension. Underlying OHS/OSA and severe COPD have likely caused pulmonary hypertension/RV failure. Diuretics had been cut back some at home with rising creatinine, leading to worsening volume overload.  He was admitted about 60 lbs up.  On exam today, he remains markedly volume overloaded with CVP 24-26.  Diuresis was not as vigorous yesterday, weight down 2 lbs.  - Transition to Lasix gtt at 15 mg/hr, will give a dose of metolazone 2.5 x 1. - Send co-ox, if low would be reasonable to use milrinone for RV support.  - Continue spironolactone 25 mg daily.  - Replace K aggressively.  - Continue UNNA boots.  2. Pulmonary hypertension: Severe by echo and RHC (pulmonary arterial hypertension).  Suspect group 3 disease due to COPD (severe obstruction on PFTs) and OSA/OHS.  V/Q scan not suggestive of chronic PEs.  He is on home oxygen and CPAP.  - Most likely would not benefit significantly from pulmonary vasodilators.  3. OHS/OSA: Continue chronic O2 and nightly CPAP.   4. Cirrhosis: Suspected NASH versus congestive hepatopathy from RV failure.  Denies ETOH. Cannot totally rule out component of portopulmonary hypertension.  5. Atrial fibrillation: Chronic, suspect permanent (present since at least 2015). Rate is reasonably controlled. - Continue Eliquis 5 mg BID.   6. Super Morbid Obesity: Needs aggressive work on diet for weight loss.   7. CKD: Stage 3, creatinine a little higher at 1.48 today with diuresis.   Length of Stay: 6  [name], MD  05/31/2019, 9:18 AM  Advanced Heart Failure Team Pager [phone] (M-F; [place])  Please contact [facility] for night-coverage after hours (4p -7a ) and weekends on [email or web address]

## 2019-05-31 NOTE — Care Management Important Message (Signed)
Important Message  Patient Details  Name: Albert James MRN: [id number] Date of Birth: 1956/02/11   Medicare Important Message Given:  Yes     Memory Argue 05/31/2019, 3:21 PM

## 2019-05-31 NOTE — Progress Notes (Signed)
Progress Note    Albert James  UDJ:[id number] DOB: [birth year]  DOA: 05/25/2019 PCP: Mayra Neer, MD    Brief Narrative:   Chief complaint: Follow-up heart failure.  Medical records reviewed and are as summarized below:  Albert James is an 63 y.o. male with a PMH of chronic diastolic CHF, severe RV dysfunction, severe pulmonary hypertension, COPD, chronic respiratory failure with hypoxia on 2 L of nasal cannula oxygen, atrial fibrillation on Eliquis, cirrhosis, leukopenia, stage III CKD, non-Hodgkin's lymphoma in remission, hypothyroidism, and OSA/OHS on nocturnal CPAP who was admitted 05/25/2019 for evaluation of worsening shortness of breath associated with weight gain despite attempts to manage as an outpatient with adjustment of home diuretic dosing.  Chest x-ray on admission showed cardiomegaly with pulmonary vascular congestion and he was given a dose of IV Lasix in the ED.  Assessment/Plan:   Principal Problem:   Acute on chronic diastolic (congestive) heart failure (HCC) in the setting of severe pulmonary hypertension/acute on chronic respiratory failure with hypoxia Admitted with decompensated heart failure and placed on IV diuretics.  Noted to have had a 60-65 pound weight gain over the past 3 months.  Evaluated by cardiology/heart failure team.  Repeat 2D echo performed 05/26/2019 and showed an EF of 60-65% with increased right ventricular systolic pressure of 96.  VQ scan not suggestive of chronic PE.  Has been managed with high-dose Lasix 80 mg IV 3 times daily as well as metolazone and spironolactone with gradual improvement of heart failure symptoms.  Chest x-ray repeated 05/29/2019 and personally reviewed and compared to images done 05/25/2019. Noted to have vascular congestion but no evidence of pneumonia or infiltrate.  Cardiologist plans to initiate continuous Lasix infusion given lack of significant diuresis.  Weight down 1 kg compared to yesterday.  No change  in subjective dyspnea however.  Remains massively volume overloaded clinically.    Active Problems:   Chronic/permanent atrial fibrillation Anticoagulated with Eliquis.  Nadolol held on admission secondary to hypotension and bradycardia.  Blood pressure/heart rate remains reasonable.    Cirrhosis ([place]) Suspected NASH versus congestive hepatopathy from right heart failure.  Managed with Lasix and lactulose.    Thrombocytopenia ([place]) Secondary to cirrhosis.  Monitor closely for signs of bleeding given concurrent use of Eliquis.    OSA on CPAP Continue CPAP at bedtime.    COPD (chronic obstructive pulmonary disease) (HCC) Continue supplemental oxygen and bronchodilators.    Hypothyroidism Continue Synthroid, TSH WNL.    Hypokalemia Secondary to diuretics.  Monitor and replace as needed.  Potassium 3.9 today.    Chronic pain Continue OxyContin 40 mg every 12 hours and oxycodone 20 mg as needed every 8 hours for breakthrough pain.  [place] controlled substance database queried with last prescriptions written 05/16/2019.    Morbid obesity Body mass index is 59.5 kg/m.  Dietitian consulted for weight loss counseling.   Family Communication/Anticipated D/C date and plan/Code Status   DVT prophylaxis: Eliquis ordered. Code Status: Full Code.  Family Communication: Daughter updated by telephone. Disposition Plan: Inpatient appropriate, not stable for d/c yet.  Will need to be further diuresed.   Medical Consultants:    Cardiology   Anti-Infectives:    None  Subjective:   Albert James feels about the same.  No change in dyspnea.  No chest pain.  Says his appetite is slightly improved, but still has no energy.  Objective:    Vitals:   05/30/19 2338 05/31/19 0040 05/31/19 5885 05/31/19 0277  BP: (!) 87/64  110/70 114/79  Pulse: 70 75 77 74  Resp: 20 18 19 19   Temp: 98.1 F (36.7 C)  97.7 F (36.5 C) 97.8 F (36.6 C)  TempSrc: Oral  Axillary Oral  SpO2:  94% 90% 92% 95%  Weight:   (!) 193.5 kg   Height:        Intake/Output Summary (Last 24 hours) at 05/31/2019 0828 Last data filed at 05/30/2019 2055 Gross per 24 hour  Intake 970 ml  Output 2600 ml  Net -1630 ml   Filed Weights   05/29/19 0500 05/30/19 0500 05/31/19 0313  Weight: (!) 197.1 kg (!) 194.5 kg (!) 193.5 kg    Exam: General: No acute distress. Cardiovascular: Heart sounds show a regular rate, and rhythm. No gallops or rubs.  2/6 systolic ejection murmur appreciated. + JVD. Lungs: Diminished in the bases with faint wheezing. Abdomen: Soft, nontender, nondistended with normal active bowel sounds. No masses. No hepatosplenomegaly. Neurological: Alert and oriented 3.  Nonfocal. Skin: Warm and dry. No rashes or lesions. Extremities: No clubbing or cyanosis.  Massive lower extremity edema with Unna boots intact. Psychiatric: Mood and affect are brighter today. Insight and judgment are fair.  Data Reviewed:   I have personally reviewed following labs and imaging studies:  Labs: Labs show the following:   Basic Metabolic Panel: Recent Labs  Lab 05/27/19 0212 05/28/19 0303 05/29/19 0433 05/30/19 0500 05/31/19 0359  NA 139 138 141 139 137  K 3.5 3.5 3.2* 3.2* 3.9  CL 100 102 104 102 99  CO2 27 26 25 26 27   GLUCOSE 85 119* 78 129* 121*  BUN 28* 27* 21 19 24*  CREATININE 1.43* 1.27* 1.28* 1.31* 1.48*  CALCIUM 8.7* 8.6* 8.6* 8.5* 8.6*  MG 2.3 2.3 2.5* 2.2 2.1  PHOS 3.4 3.3  --   --   --    GFR Estimated Creatinine Clearance: 86.3 mL/min (A) (by C-G formula based on SCr of 1.48 mg/dL (H)). Liver Function Tests: Recent Labs  Lab 05/25/19 1720 05/27/19 0212 05/28/19 0303  AST 26  --   --   ALT 26  --   --   ALKPHOS 69  --   --   BILITOT 1.0  --   --   PROT 5.5*  --   --   ALBUMIN 2.9* 2.9* 2.8*    CBC: Recent Labs  Lab 05/25/19 1638 05/26/19 0218 05/29/19 0433 05/30/19 0500 05/31/19 0359  WBC 7.1 7.6 6.3 6.8 7.5  HGB 12.6* 12.3* 12.5* 12.8*  12.3*  HCT 40.7 38.9* 40.3 41.0 39.5  MCV 107.1* 104.6* 106.1* 105.7* 105.6*  PLT 114* 111* 93* 88* 73*    Microbiology Recent Results (from the past 240 hour(s))  SARS Coronavirus 2 (CEPHEID- Performed in [place] hospital lab), Hosp Order     Status: None   Collection Time: 05/25/19  7:41 PM   Specimen: Nasopharyngeal Swab  Result Value Ref Range Status   SARS Coronavirus 2 NEGATIVE NEGATIVE Final    Comment: (NOTE) If result is NEGATIVE SARS-CoV-2 target nucleic acids are NOT DETECTED. The SARS-CoV-2 RNA is generally detectable in upper and lower  respiratory specimens during the acute phase of infection. The lowest  concentration of SARS-CoV-2 viral copies this assay can detect is 250  copies / mL. A negative result does not preclude SARS-CoV-2 infection  and should not be used as the sole basis for treatment or other  patient management decisions.  A negative result may occur  with  improper specimen collection / handling, submission of specimen other  than nasopharyngeal swab, presence of viral mutation(s) within the  areas targeted by this assay, and inadequate number of viral copies  (<250 copies / mL). A negative result must be combined with clinical  observations, patient history, and epidemiological information. If result is POSITIVE SARS-CoV-2 target nucleic acids are DETECTED. The SARS-CoV-2 RNA is generally detectable in upper and lower  respiratory specimens dur ing the acute phase of infection.  Positive  results are indicative of active infection with SARS-CoV-2.  Clinical  correlation with patient history and other diagnostic information is  necessary to determine patient infection status.  Positive results do  not rule out bacterial infection or co-infection with other viruses. If result is PRESUMPTIVE POSTIVE SARS-CoV-2 nucleic acids MAY BE PRESENT.   A presumptive positive result was obtained on the submitted specimen  and confirmed on repeat testing.   While 2019 novel coronavirus  (SARS-CoV-2) nucleic acids may be present in the submitted sample  additional confirmatory testing may be necessary for epidemiological  and / or clinical management purposes  to differentiate between  SARS-CoV-2 and other Sarbecovirus currently known to infect humans.  If clinically indicated additional testing with an alternate test  methodology [phone]) is advised. The SARS-CoV-2 RNA is generally  detectable in upper and lower respiratory sp ecimens during the acute  phase of infection. The expected result is Negative. Fact Sheet for Patients:  StrictlyIdeas.no Fact Sheet for Healthcare Providers: BankingDealers.co.za This test is not yet approved or cleared by the [place] FDA and has been authorized for detection and/or diagnosis of SARS-CoV-2 by FDA under an Emergency Use Authorization (EUA).  This EUA will remain in effect (meaning this test can be used) for the duration of the COVID-19 declaration under Section 564(b)(1) of the Act, 21 U.S.C. section 360bbb-3(b)(1), unless the authorization is terminated or revoked sooner. Performed at [facility], [place] [address] [place], [place] [address]   MRSA PCR Screening     Status: Abnormal   Collection Time: 05/25/19 10:25 PM   Specimen: Nasal Mucosa; Nasopharyngeal  Result Value Ref Range Status   MRSA by PCR POSITIVE (A) NEGATIVE Final    Comment:        The GeneXpert MRSA Assay (FDA approved for NASAL specimens only), is one component of a comprehensive MRSA colonization surveillance program. It is not intended to diagnose MRSA infection nor to guide or monitor treatment for MRSA infections. RESULT CALLED TO, READ BACK BY AND VERIFIED WITH: Jani Gravel RN 05/26/19 0032 JDW Performed at [facility], [place] [address], [place],  [address]     Procedures and diagnostic studies:  Dg Chest Port 1 View  Result Date:  05/29/2019 CLINICAL DATA:  Status post PICC line insertion EXAM: PORTABLE CHEST 1 VIEW COMPARISON:  05/25/2019 FINDINGS: Cardiac shadow remains enlarged. Right-sided PICC line is noted with the catheter tip distal superior vena cava. Mild central vascular congestion is noted. No focal infiltrate is seen. No bony abnormality is noted. IMPRESSION: PICC line as described in satisfactory position. Electronically Signed   By: Inez Catalina M.D.   On: 05/29/2019 16:05    Medications:    apixaban  5 mg Oral BID   docusate sodium  100 mg Oral BID   fluticasone  2 spray Each Nare QHS   furosemide  80 mg Intravenous TID AC   hydrocerin   Topical Daily   levothyroxine  125 mcg Oral QAC  breakfast   oxyCODONE  40 mg Oral Q12H   sodium chloride flush  10-40 mL Intracatheter Q12H   sodium chloride flush  3 mL Intravenous Once   sodium chloride flush  3 mL Intravenous Q12H   spironolactone  25 mg Oral Daily   Continuous Infusions:   LOS: 6 days   Jacquelynn Cree  [facility] Pager [phone].   *Please refer to [email or web address], password [email or web address] to get updated schedule on who will round on this patient, as hospitalists switch teams weekly. If 7PM-7AM, please contact night-coverage at [email or web address], password [email or web address] for any overnight needs.  05/31/2019, 8:28 AM

## 2019-05-31 NOTE — Plan of Care (Signed)
Nutrition Education Note  RD consulted for nutrition education regarding weight loss.  Body mass index is 59.5 kg/m. Pt meets criteria for morbid obesity based on current BMI.  Spoke with pt at bedside. Pt in good spirits, stating he feels fairly today.  Discussed pt's typical PO intake when he is at home. Pt reports that he typically eats 3 meals daily.  Breakfast (10-11 am): 3 boiled eggs with "No Salt" salt substitute and a banana Lunch (5 pm): salad with tomatoes and low sodium dressing, will sometimes add chicken Dinner (9-10 pm): salmon or tilapia with rice, uses mustard for flavor  Pt reports that he drinks water and unsweetened tea throughout the day. Pt reports that occasionally he will go out to eat a [employer] and get a "bowl" that comes with rice, beans, meat, vegetables, etc.  Pt reports that he used to weigh 420 lbs but lost about 60 lbs. Pt reports that prior to his PCP "messing with my medications," he was maintaining his weight around 380 lbs. Pt is fluid overloaded on exam and is currently diuresing.  Pt reports that at home, he ambulates with a walker around the house. Pt states that when he is lot fluid overloaded, he does not have any issues ambulating and does not feel short of breath. RD encouraged pt to walk around the house as he is able for physical activity.  RD discouraged the use of "No Salt" salt substitute and provided other ideas for flavoring food.  RD provided "Weight Loss Tips" handout from the Academy of [employer] Dietetics. Emphasized the importance of serving sizes and provided examples of correct portions of common foods. Discussed importance of controlled and consistent intake throughout the day. Provided examples of ways to balance meals/snacks and encouraged intake of high-fiber, whole grain complex carbohydrates. Emphasized the importance of hydration with calorie-free beverages and limiting sugar-sweetened beverages (which pt already does).  Encouraged pt to discuss further physical activity options with physician. Teach back method used.  RD also provided "Low Sodium Nutrition Therapy" handout from the Academy of [employer] and Dietetics. Reviewed patient's dietary recall. Provided examples on ways to decrease sodium intake in diet. Discouraged intake of processed foods and use of salt shaker. Encouraged fresh fruits and vegetables as well as whole grain sources of carbohydrates to maximize fiber intake.   RD discussed why it is important for patient to adhere to diet recommendations, and emphasized the role of fluids, foods to avoid, and importance of weighing self daily. Teach back method used.  Expect good compliance.  Current diet order is Heart Healthy with 1500 mL fluid restriction, patient is consuming approximately 100% of meals at this time. Labs and medications reviewed. No further nutrition interventions warranted at this time. RD contact information provided. If additional nutrition issues arise, please re-consult RD.    Gaynell Face, MS, RD, LDN Inpatient Clinical Dietitian Pager: [phone] Weekend/After Hours: [phone]

## 2019-06-01 LAB — CBC
HCT: 38.3 % — ABNORMAL LOW (ref 39.0–52.0)
Hemoglobin: 12 g/dL — ABNORMAL LOW (ref 13.0–17.0)
MCH: 32.1 pg (ref 26.0–34.0)
MCHC: 31.3 g/dL (ref 30.0–36.0)
MCV: 102.4 fL — ABNORMAL HIGH (ref 80.0–100.0)
Platelets: 74 10*3/uL — ABNORMAL LOW (ref 150–400)
RBC: 3.74 MIL/uL — ABNORMAL LOW (ref 4.22–5.81)
RDW: 18.5 % — ABNORMAL HIGH (ref 11.5–15.5)
WBC: 6.1 10*3/uL (ref 4.0–10.5)
nRBC: 0 % (ref 0.0–0.2)

## 2019-06-01 LAB — BASIC METABOLIC PANEL
Anion gap: 11 (ref 5–15)
BUN: 24 mg/dL — ABNORMAL HIGH (ref 8–23)
CO2: 30 mmol/L (ref 22–32)
Calcium: 8.5 mg/dL — ABNORMAL LOW (ref 8.9–10.3)
Chloride: 95 mmol/L — ABNORMAL LOW (ref 98–111)
Creatinine, Ser: 1.26 mg/dL — ABNORMAL HIGH (ref 0.61–1.24)
GFR calc Af Amer: >60 mL/min (ref >60)
GFR calc non Af Amer: 59 mL/min — ABNORMAL LOW (ref >60)
Glucose, Bld: 97 mg/dL (ref 70–99)
Potassium: 3.3 mmol/L — ABNORMAL LOW (ref 3.5–5.1)
Sodium: 136 mmol/L (ref 135–145)

## 2019-06-01 LAB — MAGNESIUM: Magnesium: 2 mg/dL (ref 1.7–2.4)

## 2019-06-01 LAB — COOXEMETRY PANEL
Carboxyhemoglobin: 2.6 % — ABNORMAL HIGH (ref 0.5–1.5)
Methemoglobin: 0.9 % (ref 0.0–1.5)
O2 Saturation: 63.8 %
Total hemoglobin: 16.2 g/dL — ABNORMAL HIGH (ref 12.0–16.0)

## 2019-06-01 MED ORDER — POLYVINYL ALCOHOL 1.4 % OP SOLN
1.0000 [drp] | OPHTHALMIC | Status: DC | PRN
  Administered 2019-06-01: 1 [drp] via OPHTHALMIC
  Filled 2019-06-01: qty 15

## 2019-06-01 MED ORDER — POTASSIUM CHLORIDE CRYS ER 20 MEQ PO TBCR
40.0000 meq | EXTENDED_RELEASE_TABLET | Freq: Two times a day (BID) | ORAL | Status: DC
  Administered 2019-06-01 – 2019-06-02 (×4): 40 meq via ORAL
  Filled 2019-06-01 (×4): qty 2

## 2019-06-01 NOTE — Progress Notes (Signed)
Progress Note    Albert James  EHU:[id number] DOB: 1956-03-29  DOA: 05/25/2019 PCP: Mayra Neer, MD    Brief Narrative:   Chief complaint: Follow-up heart failure.  Medical records reviewed and are as summarized below:  Albert James is an 63 y.o. male with a PMH of chronic diastolic CHF, severe RV dysfunction, severe pulmonary hypertension, COPD, chronic respiratory failure with hypoxia on 2 L of nasal cannula oxygen, atrial fibrillation on Eliquis, cirrhosis, leukopenia, stage III CKD, non-Hodgkin's lymphoma in remission, hypothyroidism, and OSA/OHS on nocturnal CPAP who was admitted 05/25/2019 for evaluation of worsening shortness of breath associated with weight gain despite attempts to manage as an outpatient with adjustment of home diuretic dosing.  Chest x-ray on admission showed cardiomegaly with pulmonary vascular congestion and he was given a dose of IV Lasix in the ED.  Assessment/Plan:   Principal Problem:   Acute on chronic diastolic (congestive) heart failure (HCC) in the setting of severe pulmonary hypertension/acute on chronic respiratory failure with hypoxia Admitted with decompensated heart failure and placed on IV diuretics.  Noted to have had a 60-65 pound weight gain over the past 3 months.  Evaluated by cardiology/heart failure team.  Repeat 2D echo performed 05/26/2019 and showed an EF of 60-65% with increased right ventricular systolic pressure of 96.  VQ scan not suggestive of chronic PE.  Has been managed with high-dose Lasix 80 mg IV 3 times daily as well as metolazone and spironolactone with gradual improvement of heart failure symptoms.  Chest x-ray repeated 05/29/2019 and personally reviewed and compared to images done 05/25/2019. Noted to have vascular congestion but no evidence of pneumonia or infiltrate. Continuous lasix infusion started yesterday.  I/O - 6L but weight is up over 6 kg! Suspect weight is not accurate. Clinically he remains volume  overloaded and continues to report dyspnea out of proportion to typical baseline. Given VERY high doses of lasix, monitor renal function/electrolytes closely.    Active Problems:   Chronic/permanent atrial fibrillation Anticoagulated with Eliquis.  Nadolol held on admission secondary to hypotension and bradycardia.  Blood pressure/heart rate remains reasonable 06/01/19.    Cirrhosis ([place]) Suspected NASH versus congestive hepatopathy from right heart failure.  Managed with Lasix and lactulose.    Thrombocytopenia ([place]) Secondary to cirrhosis.  Monitor closely for signs of bleeding given concurrent use of Eliquis. Platelets trending down over the course of admission, 74 today.    OSA on CPAP Continue CPAP at bedtime.    COPD (chronic obstructive pulmonary disease) (HCC) Continue supplemental oxygen and bronchodilators.    Hypothyroidism Continue Synthroid, TSH WNL.    Hypokalemia Secondary to diuretics.  Monitor and replace as needed.  Potassium 3.3 today. Will place on 40 mEq KCL BID routinely given aggressive diuresis.    Chronic pain Continue OxyContin 40 mg every 12 hours and oxycodone 20 mg as needed every 8 hours for breakthrough pain.  [place] controlled substance database queried with last prescriptions written 05/16/2019.    Morbid obesity Body mass index is 58.45 kg/m.  Dietitian provided weight loss counseling 05/31/19.   Family Communication/Anticipated D/C date and plan/Code Status   DVT prophylaxis: Eliquis ordered. Code Status: Full Code.  Family Communication: Daughter updated by telephone. Disposition Plan: Inpatient appropriate, not stable for d/c yet.  Will need to be further diuresed.   Medical Consultants:    Cardiology   Anti-Infectives:    None  Subjective:   Albert James continues to be dyspneic over typical baseline,  but improving on very high doses of diuretics. His appetite is OK and his bowels are moving.  Denies chest tightness.   No cough.   Objective:    Vitals:   05/31/19 2326 06/01/19 0334 06/01/19 0500 06/01/19 0801  BP: 106/78 103/70    Pulse: 75 67    Resp: 16 17    Temp: 98.3 F (36.8 C) 98.1 F (36.7 C)    TempSrc: Axillary Oral    SpO2: 93% 100%    Weight:   (!) 183.7 kg (!) 190.1 kg  Height:        Intake/Output Summary (Last 24 hours) at 06/01/2019 0816 Last data filed at 06/01/2019 0500 Gross per 24 hour  Intake 842.25 ml  Output 6750 ml  Net -5907.75 ml   Filed Weights   05/31/19 0313 06/01/19 0500 06/01/19 0801  Weight: (!) 193.5 kg (!) 183.7 kg (!) 190.1 kg    Exam: General: No acute distress. Morbidly obese. Cardiovascular: Heart sounds irregular. No gallops or rubs. No murmurs. + JVD. Lungs: Clear to auscultation bilaterally and diminished at the bases.  Abdomen: Soft, nontender, nondistended with normal active bowel sounds. No masses. No hepatosplenomegaly. Skin: Warm and dry. No rashes or lesions. Extremities: No clubbing or cyanosis. No edema. Massive LE edema, Unna boots intact. Neuro: Non-focal. Psych: Mood and affect appropriate to situation.   Data Reviewed:   I have personally reviewed following labs and imaging studies:  Labs: Labs show the following:   Basic Metabolic Panel: Recent Labs  Lab 05/27/19 0212 05/28/19 0303 05/29/19 0433 05/30/19 0500 05/31/19 0359 06/01/19 0352  NA 139 138 141 139 137 136  K 3.5 3.5 3.2* 3.2* 3.9 3.3*  CL 100 102 104 102 99 95*  CO2 27 26 25 26 27 30   GLUCOSE 85 119* 78 129* 121* 97  BUN 28* 27* 21 19 24* 24*  CREATININE 1.43* 1.27* 1.28* 1.31* 1.48* 1.26*  CALCIUM 8.7* 8.6* 8.6* 8.5* 8.6* 8.5*  MG 2.3 2.3 2.5* 2.2 2.1 2.0  PHOS 3.4 3.3  --   --   --   --    GFR Estimated Creatinine Clearance: 100.2 mL/min (A) (by C-G formula based on SCr of 1.26 mg/dL (H)). Liver Function Tests: Recent Labs  Lab 05/25/19 1720 05/27/19 0212 05/28/19 0303  AST 26  --   --   ALT 26  --   --   ALKPHOS 69  --   --   BILITOT 1.0   --   --   PROT 5.5*  --   --   ALBUMIN 2.9* 2.9* 2.8*    CBC: Recent Labs  Lab 05/26/19 0218 05/29/19 0433 05/30/19 0500 05/31/19 0359 06/01/19 0352  WBC 7.6 6.3 6.8 7.5 6.1  HGB 12.3* 12.5* 12.8* 12.3* 12.0*  HCT 38.9* 40.3 41.0 39.5 38.3*  MCV 104.6* 106.1* 105.7* 105.6* 102.4*  PLT 111* 93* 88* 73* 74*    Microbiology Recent Results (from the past 240 hour(s))  SARS Coronavirus 2 (CEPHEID- Performed in [place] hospital lab), Hosp Order     Status: None   Collection Time: 05/25/19  7:41 PM   Specimen: Nasopharyngeal Swab  Result Value Ref Range Status   SARS Coronavirus 2 NEGATIVE NEGATIVE Final    Comment: (NOTE) If result is NEGATIVE SARS-CoV-2 target nucleic acids are NOT DETECTED. The SARS-CoV-2 RNA is generally detectable in upper and lower  respiratory specimens during the acute phase of infection. The lowest  concentration of SARS-CoV-2 viral copies this assay  can detect is 250  copies / mL. A negative result does not preclude SARS-CoV-2 infection  and should not be used as the sole basis for treatment or other  patient management decisions.  A negative result may occur with  improper specimen collection / handling, submission of specimen other  than nasopharyngeal swab, presence of viral mutation(s) within the  areas targeted by this assay, and inadequate number of viral copies  (<250 copies / mL). A negative result must be combined with clinical  observations, patient history, and epidemiological information. If result is POSITIVE SARS-CoV-2 target nucleic acids are DETECTED. The SARS-CoV-2 RNA is generally detectable in upper and lower  respiratory specimens dur ing the acute phase of infection.  Positive  results are indicative of active infection with SARS-CoV-2.  Clinical  correlation with patient history and other diagnostic information is  necessary to determine patient infection status.  Positive results do  not rule out bacterial infection or  co-infection with other viruses. If result is PRESUMPTIVE POSTIVE SARS-CoV-2 nucleic acids MAY BE PRESENT.   A presumptive positive result was obtained on the submitted specimen  and confirmed on repeat testing.  While 2019 novel coronavirus  (SARS-CoV-2) nucleic acids may be present in the submitted sample  additional confirmatory testing may be necessary for epidemiological  and / or clinical management purposes  to differentiate between  SARS-CoV-2 and other Sarbecovirus currently known to infect humans.  If clinically indicated additional testing with an alternate test  methodology [phone]) is advised. The SARS-CoV-2 RNA is generally  detectable in upper and lower respiratory sp ecimens during the acute  phase of infection. The expected result is Negative. Fact Sheet for Patients:  StrictlyIdeas.no Fact Sheet for Healthcare Providers: BankingDealers.co.za This test is not yet approved or cleared by the [place] FDA and has been authorized for detection and/or diagnosis of SARS-CoV-2 by FDA under an Emergency Use Authorization (EUA).  This EUA will remain in effect (meaning this test can be used) for the duration of the COVID-19 declaration under Section 564(b)(1) of the Act, 21 U.S.C. section 360bbb-3(b)(1), unless the authorization is terminated or revoked sooner. Performed at [facility], [place] [address] [place], [place] [address]   MRSA PCR Screening     Status: Abnormal   Collection Time: 05/25/19 10:25 PM   Specimen: Nasal Mucosa; Nasopharyngeal  Result Value Ref Range Status   MRSA by PCR POSITIVE (A) NEGATIVE Final    Comment:        The GeneXpert MRSA Assay (FDA approved for NASAL specimens only), is one component of a comprehensive MRSA colonization surveillance program. It is not intended to diagnose MRSA infection nor to guide or monitor treatment for MRSA infections. RESULT CALLED TO, READ BACK BY  AND VERIFIED WITH: Jani Gravel RN 05/26/19 0032 JDW Performed at [facility], [place] [address], [place], [place] [address]     Procedures and diagnostic studies:  No results found.  Medications:    apixaban  5 mg Oral BID   docusate sodium  100 mg Oral BID   fluticasone  2 spray Each Nare QHS   hydrocerin   Topical Daily   levothyroxine  125 mcg Oral QAC breakfast   oxyCODONE  40 mg Oral Q12H   sodium chloride flush  10-40 mL Intracatheter Q12H   sodium chloride flush  3 mL Intravenous Once   sodium chloride flush  3 mL Intravenous Q12H   spironolactone  25 mg Oral Daily  Continuous Infusions:  furosemide (LASIX) infusion 15 mg/hr (06/01/19 0340)     LOS: 7 days   Jacquelynn Cree  [facility] Pager [phone].   *Please refer to [email or web address], password [email or web address] to get updated schedule on who will round on this patient, as hospitalists switch teams weekly. If 7PM-7AM, please contact night-coverage at [email or web address], password [email or web address] for any overnight needs.  06/01/2019, 8:16 AM

## 2019-06-01 NOTE — Evaluation (Signed)
Physical Therapy Evaluation Patient Details Name: Albert James MRN: [id number] DOB: [birth year] Today's Date: 06/01/2019   History of Present Illness  Albert James is an 63 y.o. male with a PMH of chronic diastolic CHF, severe RV dysfunction, severe pulmonary hypertension, COPD, chronic respiratory failure with hypoxia on 2 L of nasal cannula oxygen, atrial fibrillation on Eliquis, cirrhosis, leukopenia, stage III CKD, non-Hodgkin's lymphoma in remission, hypothyroidism, and OSA/OHS on nocturnal CPAP who was admitted 05/25/2019 for evaluation of worsening shortness of breath associated with weight gain despite attempts to manage as an outpatient with adjustment of home diuretic dosing.  Clinical Impression  Pt admitted with above diagnosis. Pt currently with functional limitations due to the deficits listed below (see PT Problem List). Pt was able to ambulate a few steps with min guard assist with bil UEs upport.  Uses rollator at home.  Pt should progress well.  HHPT would benefit pt.   Pt will benefit from skilled PT to increase their independence and safety with mobility to allow discharge to the venue listed below.      Follow Up Recommendations Home health PT;Supervision/Assistance - 24 hour    Equipment Recommendations  Other (comment)(Pt has been trying to get motorized wheelchair)    Recommendations for Other Services       Precautions / Restrictions Precautions Precautions: Fall Restrictions Weight Bearing Restrictions: No      Mobility  Bed Mobility               General bed mobility comments: Pt was in chair on arrival  Transfers Overall transfer level: Needs assistance Equipment used: None Transfers: Sit to/from Stand Sit to Stand: Supervision         General transfer comment: Did not need assist to rise from chair.    Ambulation/Gait Ambulation/Gait assistance: Min guard Gait Distance (Feet): 4 Feet Assistive device: 2 person hand held  assist Gait Pattern/deviations: Decreased stride length;Shuffle;Step-to pattern;Decreased step length - right;Decreased step length - left;Decreased weight shift to right;Decreased weight shift to left;Antalgic;Drifts right/left;Wide base of support   Gait velocity interpretation: <1.31 ft/sec, indicative of household ambulator General Gait Details: Pt only able to take a few steps forward and back with bil HHA as he states he is too tired to walk in hall.  Pt on 6LO2 at rest with sats >90%.  Pt does well with bil UE support.   Stairs            Wheelchair Mobility    Modified Rankin (Stroke Patients Only)       Balance Overall balance assessment: Needs assistance Sitting-balance support: No upper extremity supported;Feet supported Sitting balance-Leahy Scale: Fair     Standing balance support: Bilateral upper extremity supported;During functional activity Standing balance-Leahy Scale: Poor Standing balance comment: relies on UE support                             Pertinent Vitals/Pain Pain Assessment: No/denies pain    Home Living Family/patient expects to be discharged to:: Private residence Living Arrangements: Children(lives with daughter who works evenings) Available Help at Discharge: Family;Available PRN/intermittently Type of Home: House Home Access: Ramped entrance     Home Layout: One level Home Equipment: Walker - 2 wheels;Cane - single point;Walker - 4 wheels;Hand held shower head;Shower seat - built in      Prior Function Level of Independence: Independent with assistive device(s)   Gait / Transfers Assistance Needed: used rollator in  home  ADL's / Homemaking Assistance Needed: Just redid the shower to have a built in walk in shower and a handicapped toilet        Hand Dominance        Extremity/Trunk Assessment   Upper Extremity Assessment Upper Extremity Assessment: Defer to OT evaluation    Lower Extremity Assessment Lower  Extremity Assessment: Generalized weakness;RLE deficits/detail;LLE deficits/detail RLE Deficits / Details: compression wraps in place, limited ROM due to this, grossly 3/5 LLE Deficits / Details: compression wraps in place, limited ROM due to this, grossly 3/5       Communication   Communication: No difficulties  Cognition Arousal/Alertness: Awake/alert Behavior During Therapy: WFL for tasks assessed/performed Overall Cognitive Status: Within Functional Limits for tasks assessed                                        General Comments      Exercises General Exercises - Lower Extremity Ankle Circles/Pumps: AROM;Both;10 reps;Supine Quad Sets: AROM;Both;10 reps;Supine Long Arc Quad: AROM;Both;10 reps;Seated   Assessment/Plan    PT Assessment Patient needs continued PT services  PT Problem List Decreased activity tolerance;Decreased balance;Decreased mobility;Decreased strength;Decreased knowledge of use of DME;Decreased safety awareness;Decreased knowledge of precautions;Decreased skin integrity       PT Treatment Interventions DME instruction;Gait training;Functional mobility training;Therapeutic activities;Therapeutic exercise;Balance training;Patient/family education    PT Goals (Current goals can be found in the Care Plan section)  Acute Rehab PT Goals Patient Stated Goal: to go home PT Goal Formulation: With patient Time For Goal Achievement: 06/15/19 Potential to Achieve Goals: Good    Frequency Min 3X/week   Barriers to discharge        Co-evaluation               AM-PAC PT "6 Clicks" Mobility  Outcome Measure Help needed turning from your back to your side while in a flat bed without using bedrails?: A Little Help needed moving from lying on your back to sitting on the side of a flat bed without using bedrails?: A Little Help needed moving to and from a bed to a chair (including a wheelchair)?: A Little Help needed standing up from a chair  using your arms (e.g., wheelchair or bedside chair)?: A Little Help needed to walk in hospital room?: A Little Help needed climbing 3-5 steps with a railing? : A Little 6 Click Score: 18    End of Session Equipment Utilized During Treatment: Gait belt;Oxygen Activity Tolerance: Patient limited by fatigue Patient left: in chair;with call bell/phone within reach Nurse Communication: Mobility status PT Visit Diagnosis: Unsteadiness on feet (R26.81);Muscle weakness (generalized) (M62.81)    Time: 9417-4081 PT Time Calculation (min) (ACUTE ONLY): 13 min   Charges:   PT Evaluation $PT Eval Moderate Complexity: [place] Pager:  [phone]  Office:  [phone]    Denice Paradise 06/01/2019, 1:41 PM

## 2019-06-01 NOTE — Progress Notes (Signed)
Patient's potassium this morning was 3.3.  On-called paged regarding results.  Awaiting orders.

## 2019-06-01 NOTE — Progress Notes (Signed)
Patient ID: Albert James, male   DOB: [birth year], 63 y.o.   MRN: [id number]     Advanced Heart Failure Rounding Note  PCP-Cardiologist: [name], MD   Subjective:    Still dyspneic. Afebrile.  Weight down 7 lbs, UOP better.  Creatinine 1.31 => 1.48 => 1.26. CVP 19.   Echo (7/20): EF 60-65%, moderate LVH, moderately decreased RV systolic function with moderate RV dilation and D-shaped septum, severe TR, PASP 96 mmHg.    Objective:   Weight Range: (!) 190.1 kg Body mass index is 58.45 kg/m.   Vital Signs:   Temp:  [98.1 F (36.7 C)-98.3 F (36.8 C)] 98.3 F (36.8 C) (07/17 0750) Pulse Rate:  [67-107] 107 (07/17 0748) Resp:  [14-20] 14 (07/17 0748) BP: (103-112)/(70-86) 112/82 (07/17 0748) SpO2:  [88 %-100 %] 88 % (07/17 0748) Weight:  [183.7 kg-190.1 kg] 190.1 kg (07/17 0801) Last BM Date: 05/31/19  Weight change: Filed Weights   05/31/19 0313 06/01/19 0500 06/01/19 0801  Weight: (!) 193.5 kg (!) 183.7 kg (!) 190.1 kg    Intake/Output:   Intake/Output Summary (Last 24 hours) at 06/01/2019 1216 Last data filed at 06/01/2019 1000 Gross per 24 hour  Intake 937.25 ml  Output 7575 ml  Net -6637.75 ml      Physical Exam    General: NAD Neck: JVP 16+, no thyromegaly or thyroid nodule.  Lungs: Decreased BS at bases.  CV: Nonpalpable PMI.  Heart irregular S1/S2, no S3/S4, no murmur.  2+ edema to thighs.   Abdomen: Soft, nontender, no hepatosplenomegaly, no distention.  Skin: Intact without lesions or rashes.  Neurologic: Alert and oriented x 3.  Psych: Normal affect. Extremities: No clubbing or cyanosis.  HEENT: Normal.   Telemetry   Atrial fibrillation rate 80s (personally reviewed).   Labs    CBC Recent Labs    05/31/19 0359 06/01/19 0352  WBC 7.5 6.1  HGB 12.3* 12.0*  HCT 39.5 38.3*  MCV 105.6* 102.4*  PLT 73* 74*   Basic Metabolic Panel Recent Labs    05/31/19 0359 06/01/19 0352  NA 137 136  K 3.9 3.3*  CL 99 95*  CO2 27 30   GLUCOSE 121* 97  BUN 24* 24*  CREATININE 1.48* 1.26*  CALCIUM 8.6* 8.5*  MG 2.1 2.0   Liver Function Tests No results for input(s): AST, ALT, ALKPHOS, BILITOT, PROT, ALBUMIN in the last 72 hours. No results for input(s): LIPASE, AMYLASE in the last 72 hours. Cardiac Enzymes No results for input(s): CKTOTAL, CKMB, CKMBINDEX, TROPONINI in the last 72 hours.  BNP: BNP (last 3 results) Recent Labs    12/23/18 0004 05/25/19 1720 05/29/19 0433  BNP 1,221.0* 740.5* 413.4*    ProBNP (last 3 results) No results for input(s): PROBNP in the last 8760 hours.   D-Dimer No results for input(s): DDIMER in the last 72 hours. Hemoglobin A1C No results for input(s): HGBA1C in the last 72 hours. Fasting Lipid Panel No results for input(s): CHOL, HDL, LDLCALC, TRIG, CHOLHDL, LDLDIRECT in the last 72 hours. Thyroid Function Tests No results for input(s): TSH, T4TOTAL, T3FREE, THYROIDAB in the last 72 hours.  Invalid input(s): FREET3  Other results:   Imaging    No results found.   Medications:     Scheduled Medications: . apixaban  5 mg Oral BID  . docusate sodium  100 mg Oral BID  . fluticasone  2 spray Each Nare QHS  . hydrocerin   Topical Daily  . levothyroxine  125  mcg Oral QAC breakfast  . oxyCODONE  40 mg Oral Q12H  . potassium chloride  40 mEq Oral BID  . sodium chloride flush  10-40 mL Intracatheter Q12H  . spironolactone  25 mg Oral Daily    Infusions: . furosemide (LASIX) infusion 15 mg/hr (06/01/19 0340)    PRN Medications: acetaminophen **OR** acetaminophen, albuterol, lactulose, naproxen sodium, oxyCODONE, sodium chloride flush   Assessment/Plan   1. Acute on chronic diastolic CHF with prominent RV failure: Echo most remarkable for pulmonary hypertension, severe TR, and RV failure (7/20). Suspect RV failure is triggered by pulmonary hypertension. Underlying OHS/OSA and severe COPD have likely caused pulmonary hypertension/RV failure. Diuretics had been  cut back some at home with rising creatinine, leading to worsening volume overload.  He was admitted about 60 lbs up.  On exam today, he remains markedly volume overloaded with CVP 19.  Good diuresis yesterday, weight down 7 lbs.  Co-ox 64%, suggesting RV function adequate without inotrope for now.  - Continue Lasix gtt at 15 mg/hr, will give a dose of metolazone 2.5 x 1 again.  Replace K.   - Continue spironolactone 25 mg daily.  - Continue UNNA boots.  2. Pulmonary hypertension: Severe by echo and RHC (pulmonary arterial hypertension).  Suspect group 3 disease due to COPD (severe obstruction on PFTs) and OSA/OHS.  V/Q scan not suggestive of chronic PEs.  He is on home oxygen and CPAP.  - Most likely would not benefit significantly from pulmonary vasodilators.  3. OHS/OSA: Continue chronic O2 and nightly CPAP.   4. Cirrhosis: Suspected NASH versus congestive hepatopathy from RV failure.  Denies ETOH. Cannot totally rule out component of portopulmonary hypertension.  5. Atrial fibrillation: Chronic, suspect permanent (present since at least 2015). Rate is reasonably controlled. - Continue Eliquis 5 mg BID.   6. Super Morbid Obesity: Needs aggressive work on diet for weight loss.   7. CKD: Stage 3, creatinine lower today with diuresis.   Length of Stay: 7  [name], MD  06/01/2019, 12:16 PM  Advanced Heart Failure Team Pager [phone] (M-F; 7a - 4p)  Please contact [facility] for night-coverage after hours (4p -7a ) and weekends on [email or web address]

## 2019-06-02 LAB — BASIC METABOLIC PANEL
Anion gap: 10 (ref 5–15)
Anion gap: 12 (ref 5–15)
BUN: 25 mg/dL — ABNORMAL HIGH (ref 8–23)
BUN: 24 mg/dL — ABNORMAL HIGH (ref 8–23)
CO2: 32 mmol/L (ref 22–32)
CO2: 28 mmol/L (ref 22–32)
Calcium: 8.1 mg/dL — ABNORMAL LOW (ref 8.9–10.3)
Calcium: 8 mg/dL — ABNORMAL LOW (ref 8.9–10.3)
Chloride: 94 mmol/L — ABNORMAL LOW (ref 98–111)
Chloride: 87 mmol/L — ABNORMAL LOW (ref 98–111)
Creatinine, Ser: 1.24 mg/dL (ref 0.61–1.24)
Creatinine, Ser: 1.28 mg/dL — ABNORMAL HIGH (ref 0.61–1.24)
GFR calc Af Amer: >60 mL/min (ref >60)
GFR calc Af Amer: >60 mL/min (ref >60)
GFR calc non Af Amer: >60 mL/min (ref >60)
GFR calc non Af Amer: 58 mL/min — ABNORMAL LOW (ref >60)
Glucose, Bld: 103 mg/dL — ABNORMAL HIGH (ref 70–99)
Glucose, Bld: 280 mg/dL — ABNORMAL HIGH (ref 70–99)
Potassium: 2.7 mmol/L — CL (ref 3.5–5.1)
Potassium: 3.3 mmol/L — ABNORMAL LOW (ref 3.5–5.1)
Sodium: 136 mmol/L (ref 135–145)
Sodium: 127 mmol/L — ABNORMAL LOW (ref 135–145)

## 2019-06-02 LAB — COOXEMETRY PANEL
Carboxyhemoglobin: 2.1 % — ABNORMAL HIGH (ref 0.5–1.5)
Methemoglobin: 0.7 % (ref 0.0–1.5)
O2 Saturation: 56.2 %
Total hemoglobin: 11.2 g/dL — ABNORMAL LOW (ref 12.0–16.0)

## 2019-06-02 LAB — CBC
HCT: 37.3 % — ABNORMAL LOW (ref 39.0–52.0)
Hemoglobin: 11.7 g/dL — ABNORMAL LOW (ref 13.0–17.0)
MCH: 32.5 pg (ref 26.0–34.0)
MCHC: 31.4 g/dL (ref 30.0–36.0)
MCV: 103.6 fL — ABNORMAL HIGH (ref 80.0–100.0)
Platelets: 76 10*3/uL — ABNORMAL LOW (ref 150–400)
RBC: 3.6 MIL/uL — ABNORMAL LOW (ref 4.22–5.81)
RDW: 18.5 % — ABNORMAL HIGH (ref 11.5–15.5)
WBC: 5.4 10*3/uL (ref 4.0–10.5)
nRBC: 0 % (ref 0.0–0.2)

## 2019-06-02 LAB — MAGNESIUM: Magnesium: 2 mg/dL (ref 1.7–2.4)

## 2019-06-02 MED ORDER — POTASSIUM CHLORIDE 10 MEQ/100ML IV SOLN
10.0000 meq | INTRAVENOUS | Status: AC
  Administered 2019-06-02 (×2): 10 meq via INTRAVENOUS
  Filled 2019-06-02 (×2): qty 100

## 2019-06-02 MED ORDER — POTASSIUM CHLORIDE CRYS ER 20 MEQ PO TBCR
40.0000 meq | EXTENDED_RELEASE_TABLET | Freq: Once | ORAL | Status: AC
  Administered 2019-06-02: 40 meq via ORAL
  Filled 2019-06-02: qty 2

## 2019-06-02 MED ORDER — ACETAZOLAMIDE 250 MG PO TABS
250.0000 mg | ORAL_TABLET | Freq: Two times a day (BID) | ORAL | Status: DC
  Administered 2019-06-02 – 2019-06-03 (×3): 250 mg via ORAL
  Filled 2019-06-02 (×3): qty 1

## 2019-06-02 MED ORDER — POTASSIUM CHLORIDE 10 MEQ/100ML IV SOLN
10.0000 meq | INTRAVENOUS | Status: AC
  Administered 2019-06-02 (×6): 10 meq via INTRAVENOUS
  Filled 2019-06-02: qty 100

## 2019-06-02 NOTE — Progress Notes (Signed)
Patient ID: BREVON DEWALD, male   DOB: 1956/08/30, 63 y.o.   MRN: [id number]     Advanced Heart Failure Rounding Note  PCP-Cardiologist: [name], MD   Subjective:    Still feels SOB. Diuresing on lasix gtt at 15. Got 1 dose of metolazone yesterday. Weight down about another 2 pounds. (29 pounds total). CVP checked personally 13-14.  Co-ox 64-> 56% K 2.7. Creatinine stable at 1.2  Echo (7/20): EF 60-65%, moderate LVH, moderately decreased RV systolic function with moderate RV dilation and D-shaped septum, severe TR, PASP 96 mmHg.    Objective:   Weight Range: (!) 189.6 kg Body mass index is 58.3 kg/m.   Vital Signs:   Temp:  [97.7 F (36.5 C)-98.5 F (36.9 C)] 98.1 F (36.7 C) (07/18 1153) Pulse Rate:  [63-68] 63 (07/18 1153) Resp:  [13-19] 17 (07/18 1153) BP: (95-107)/(67-85) 95/67 (07/18 1153) SpO2:  [91 %-99 %] 91 % (07/18 1153) Weight:  [189.6 kg] 189.6 kg (07/18 0159) Last BM Date: 06/01/19  Weight change: Filed Weights   06/01/19 0500 06/01/19 0801 06/02/19 0159  Weight: (!) 183.7 kg (!) 190.1 kg (!) 189.6 kg    Intake/Output:   Intake/Output Summary (Last 24 hours) at 06/02/2019 1401 Last data filed at 06/02/2019 1300 Gross per 24 hour  Intake 1391.38 ml  Output 3675 ml  Net -2283.62 ml      Physical Exam    General:  Morbidly obese male sitting in chair No resp difficulty HEENT: normal Neck: supple. JVP to ear. Carotids 2+ bilat; no bruits. No lymphadenopathy or thryomegaly appreciated. Cor: PMI nondisplaced. Irregular rate & rhythm. No rubs, gallops or murmurs. Lungs: clear Abdomen: very obese soft, nontender, nondistended. No bruits or masses. Good bowel sounds. Extremities: no cyanosis, clubbing, rash, + unna boots 2+ edema in addition to chronic lymphedeme Neuro: alert & orientedx3, cranial nerves grossly intact. moves all 4 extremities w/o difficulty. Affect pleasant  Telemetry   Atrial fibrillation rate 70s (personally reviewed).    Labs    CBC Recent Labs    06/01/19 0352 06/02/19 0425  WBC 6.1 5.4  HGB 12.0* 11.7*  HCT 38.3* 37.3*  MCV 102.4* 103.6*  PLT 74* 76*   Basic Metabolic Panel Recent Labs    06/01/19 0352 06/02/19 0425  NA 136 136  K 3.3* 2.7*  CL 95* 94*  CO2 30 32  GLUCOSE 97 103*  BUN 24* 25*  CREATININE 1.26* 1.24  CALCIUM 8.5* 8.1*  MG 2.0 2.0   Liver Function Tests No results for input(s): AST, ALT, ALKPHOS, BILITOT, PROT, ALBUMIN in the last 72 hours. No results for input(s): LIPASE, AMYLASE in the last 72 hours. Cardiac Enzymes No results for input(s): CKTOTAL, CKMB, CKMBINDEX, TROPONINI in the last 72 hours.  BNP: BNP (last 3 results) Recent Labs    12/23/18 0004 05/25/19 1720 05/29/19 0433  BNP 1,221.0* 740.5* 413.4*    ProBNP (last 3 results) No results for input(s): PROBNP in the last 8760 hours.   D-Dimer No results for input(s): DDIMER in the last 72 hours. Hemoglobin A1C No results for input(s): HGBA1C in the last 72 hours. Fasting Lipid Panel No results for input(s): CHOL, HDL, LDLCALC, TRIG, CHOLHDL, LDLDIRECT in the last 72 hours. Thyroid Function Tests No results for input(s): TSH, T4TOTAL, T3FREE, THYROIDAB in the last 72 hours.  Invalid input(s): FREET3  Other results:   Imaging    No results found.   Medications:     Scheduled Medications: . apixaban  5 mg Oral BID  . docusate sodium  100 mg Oral BID  . fluticasone  2 spray Each Nare QHS  . hydrocerin   Topical Daily  . levothyroxine  125 mcg Oral QAC breakfast  . oxyCODONE  40 mg Oral Q12H  . potassium chloride  40 mEq Oral BID  . spironolactone  25 mg Oral Daily    Infusions: . furosemide (LASIX) infusion 15 mg/hr (06/02/19 1228)  . potassium chloride 10 mEq (06/02/19 1324)    PRN Medications: acetaminophen **OR** acetaminophen, albuterol, lactulose, naproxen sodium, oxyCODONE, polyvinyl alcohol, sodium chloride flush   Assessment/Plan   1. Acute on chronic diastolic  CHF with prominent RV failure: Echo most remarkable for pulmonary hypertension, severe TR, and RV failure (7/20). Suspect RV failure is triggered by pulmonary hypertension. Underlying OHS/OSA and severe COPD have likely caused pulmonary hypertension/RV failure. Diuretics had been cut back some at home with rising creatinine, leading to worsening volume overload.  He was admitted about 60 lbs up.  Weight now down 29 pounds. CVP still up.  - Continue Lasix gtt at 15 mg/hr. K very low so will not repeat metolazone today. Will add diamox 250 bid - Co-ox marginal. If continues to drop will need short course of milrinone for RV support - Continue spironolactone 25 mg daily.  - Continue UNNA boots.  2. Pulmonary hypertension: Severe by echo and RHC (pulmonary arterial hypertension).  Suspect group 3 disease due to COPD (severe obstruction on PFTs) and OSA/OHS.  V/Q scan not suggestive of chronic PEs.  He is on home oxygen and CPAP.  - Most likely would not benefit significantly from pulmonary vasodilators.  3. OHS/OSA: Continue chronic O2 and nightly CPAP.   4. Cirrhosis: Suspected NASH versus congestive hepatopathy from RV failure.  Denies ETOH. Cannot totally rule out component of portopulmonary hypertension.  5. Atrial fibrillation: Permanent (present since at least 2015). Rate is reasonably controlled. - Continue Eliquis 5 mg BID.   6. Super Morbid Obesity: Needs aggressive work on diet for weight loss.   7. CKD: Stage 3, creatinine satble at 1.2 today 8. Hypokalemia - supp   Length of Stay: 8  Glori Bickers, MD  06/02/2019, 2:01 PM  Advanced Heart Failure Team Pager [phone] (M-F; 7a - 4p)  Please contact [facility] for night-coverage after hours (4p -7a ) and weekends on [email or web address]

## 2019-06-02 NOTE — Progress Notes (Addendum)
CRITICAL VALUE ALERT  Critical Value:  Potassium - 2.7   Date & Time Notied:  7/18 @ 0512  Provider Notified: X. Blount  Orders Received/Actions taken: Awaiting orders

## 2019-06-02 NOTE — Progress Notes (Signed)
Progress Note    Albert MARCELLA  NFA:[id number] DOB: 07-12-1956  DOA: 05/25/2019 PCP: Mayra Neer, MD    Brief Narrative:   Chief complaint: Follow-up heart failure.  Medical records reviewed and are as summarized below:  Albert James is an 63 y.o. male with a PMH of chronic diastolic CHF, severe RV dysfunction, severe pulmonary hypertension, COPD, chronic respiratory failure with hypoxia on 2 L of nasal cannula oxygen, atrial fibrillation on Eliquis, cirrhosis, leukopenia, stage III CKD, non-Hodgkin's lymphoma in remission, hypothyroidism, and OSA/OHS on nocturnal CPAP who was admitted 05/25/2019 for evaluation of worsening shortness of breath associated with weight gain despite attempts to manage as an outpatient with adjustment of home diuretic dosing.  Chest x-ray on admission showed cardiomegaly with pulmonary vascular congestion and he was given a dose of IV Lasix in the ED.  Subjective:   Patient in bed, appears comfortable, denies any headache, no fever, no chest pain or pressure, improved shortness of breath , no abdominal pain. No focal weakness.  Assessment/Plan:   Principal Problem:   Acute on chronic diastolic (congestive) heart failure (HCC) in the setting of severe pulmonary hypertension/acute on chronic respiratory failure with hypoxia  Admitted with decompensated heart failure and placed on IV diuretics.  Noted to have had a 60-65 pound weight gain over the past 3 months.  CHF team following the patient, repeat echocardiogram done this admission on 05/26/2019 showed a preserved EF of 60 to 65% but increased right ventricular systolic pressure of 96 mmHg.  VQ scan confirms chronic PEs.  He is already on Eliquis for that which will be continued.  Currently on high-dose IV Lasix drip along Aldactone, monitor intake and output, monitor electrolytes closely.  So far 22 L negative balance as of 06/02/2019.   Intake/Output Summary (Last 24 hours) at 06/02/2019  0954 Last data filed at 06/02/2019 0801 Gross per 24 hour  Intake 1466.38 ml  Output 4475 ml  Net -3008.62 ml   Filed Weights   06/01/19 0500 06/01/19 0801 06/02/19 0159  Weight: (!) 183.7 kg (!) 190.1 kg (!) 189.6 kg           Chronic/permanent atrial fibrillation with [place] vas 2 score of at least 3 Anticoagulated with Eliquis.  Nadolol held on admission secondary to hypotension and bradycardia.  Continue to monitor.    Cirrhosis ([place]) Suspected NASH versus congestive hepatopathy from right heart failure.  Managed with Lasix and lactulose.    Thrombocytopenia ([place]) Secondary to cirrhosis.  Monitor closely for signs of bleeding given concurrent use of Eliquis. Platelets trending down over the course of admission, 74 today.    OSA on CPAP Continue CPAP at bedtime.    COPD (chronic obstructive pulmonary disease) (HCC) Continue supplemental oxygen and bronchodilators.    Hypothyroidism Continue Synthroid, TSH WNL.    Hypokalemia Replaced aggressively on 06/02/2019 along with magnesium we will continue to monitor.    Chronic pain Continue OxyContin 40 mg every 12 hours and oxycodone 20 mg as needed every 8 hours for breakthrough pain.  [place] controlled substance database queried with last prescriptions written 05/16/2019.    Morbid obesity Body mass index is 58.3 kg/m.  Dietitian provided weight loss counseling 05/31/19.   Family Communication/Anticipated D/C date and plan/Code Status   DVT prophylaxis: Eliquis ordered. Code Status: Full Code.  Family Communication: Daughter updated by telephone by previous MD. Disposition Plan: Inpatient appropriate, not stable for d/c yet.  Will need to be further diuresed.  Medical Consultants:    Cardiology   Anti-Infectives:    None   Objective:    Vitals:   06/01/19 2338 06/02/19 0159 06/02/19 0346 06/02/19 0801  BP: 100/69  105/85 96/68  Pulse:   65 68  Resp: 13  17 14   Temp: 98.5 F (36.9 C)  97.7 F  (36.5 C) 97.7 F (36.5 C)  TempSrc: Oral  Axillary Oral  SpO2: 92%  98% 97%  Weight:  (!) 189.6 kg    Height:        Intake/Output Summary (Last 24 hours) at 06/02/2019 0943 Last data filed at 06/02/2019 0801 Gross per 24 hour  Intake 1466.38 ml  Output 4475 ml  Net -3008.62 ml   Filed Weights   06/01/19 0500 06/01/19 0801 06/02/19 0159  Weight: (!) 183.7 kg (!) 190.1 kg (!) 189.6 kg    Exam:  Awake Alert, Oriented X 3, No new F.N deficits, Normal affect [place].AT,PERRAL Supple Neck,No JVD, No cervical lymphadenopathy appriciated.  Symmetrical Chest wall movement, Good air movement bilaterally, CTAB RRR,No Gallops, Rubs or new Murmurs, No Parasternal Heave +ve B.Sounds, Abd Soft, No tenderness, No organomegaly appriciated, No rebound - guarding or rigidity. No Cyanosis, Clubbing,  2+ leg edema with UNNA boots, No new Rash or bruise   Data Reviewed:   I have personally reviewed following labs and imaging studies:  Labs: Labs show the following:   Basic Metabolic Panel:  Recent Labs  Lab 05/27/19 0212 05/28/19 0303 05/29/19 0433 05/30/19 0500 05/31/19 0359 06/01/19 0352 06/02/19 0425  NA 139 138 141 139 137 136 136  K 3.5 3.5 3.2* 3.2* 3.9 3.3* 2.7*  CL 100 102 104 102 99 95* 94*  CO2 27 26 25 26 27 30  32  GLUCOSE 85 119* 78 129* 121* 97 103*  BUN 28* 27* 21 19 24* 24* 25*  CREATININE 1.43* 1.27* 1.28* 1.31* 1.48* 1.26* 1.24  CALCIUM 8.7* 8.6* 8.6* 8.5* 8.6* 8.5* 8.1*  MG 2.3 2.3 2.5* 2.2 2.1 2.0 2.0  PHOS 3.4 3.3  --   --   --   --   --    GFR Estimated Creatinine Clearance: 101.6 mL/min (by C-G formula based on SCr of 1.24 mg/dL). Liver Function Tests: Recent Labs  Lab 05/27/19 0212 05/28/19 0303  ALBUMIN 2.9* 2.8*    CBC: Recent Labs  Lab 05/29/19 0433 05/30/19 0500 05/31/19 0359 06/01/19 0352 06/02/19 0425  WBC 6.3 6.8 7.5 6.1 5.4  HGB 12.5* 12.8* 12.3* 12.0* 11.7*  HCT 40.3 41.0 39.5 38.3* 37.3*  MCV 106.1* 105.7* 105.6* 102.4* 103.6*   PLT 93* 88* 73* 74* 76*    Microbiology Recent Results (from the past 240 hour(s))  SARS Coronavirus 2 (CEPHEID- Performed in [facility] hospital lab), Hosp Order     Status: None   Collection Time: 05/25/19  7:41 PM   Specimen: Nasopharyngeal Swab  Result Value Ref Range Status   SARS Coronavirus 2 NEGATIVE NEGATIVE Final    Comment: (NOTE) If result is NEGATIVE SARS-CoV-2 target nucleic acids are NOT DETECTED. The SARS-CoV-2 RNA is generally detectable in upper and lower  respiratory specimens during the acute phase of infection. The lowest  concentration of SARS-CoV-2 viral copies this assay can detect is 250  copies / mL. A negative result does not preclude SARS-CoV-2 infection  and should not be used as the sole basis for treatment or other  patient management decisions.  A negative result may occur with  improper specimen collection / handling, submission  of specimen other  than nasopharyngeal swab, presence of viral mutation(s) within the  areas targeted by this assay, and inadequate number of viral copies  (<250 copies / mL). A negative result must be combined with clinical  observations, patient history, and epidemiological information. If result is POSITIVE SARS-CoV-2 target nucleic acids are DETECTED. The SARS-CoV-2 RNA is generally detectable in upper and lower  respiratory specimens dur ing the acute phase of infection.  Positive  results are indicative of active infection with SARS-CoV-2.  Clinical  correlation with patient history and other diagnostic information is  necessary to determine patient infection status.  Positive results do  not rule out bacterial infection or co-infection with other viruses. If result is PRESUMPTIVE POSTIVE SARS-CoV-2 nucleic acids MAY BE PRESENT.   A presumptive positive result was obtained on the submitted specimen  and confirmed on repeat testing.  While 2019 novel coronavirus  (SARS-CoV-2) nucleic acids may be present in the  submitted sample  additional confirmatory testing may be necessary for epidemiological  and / or clinical management purposes  to differentiate between  SARS-CoV-2 and other Sarbecovirus currently known to infect humans.  If clinically indicated additional testing with an alternate test  methodology [phone]) is advised. The SARS-CoV-2 RNA is generally  detectable in upper and lower respiratory sp ecimens during the acute  phase of infection. The expected result is Negative. Fact Sheet for Patients:  StrictlyIdeas.no Fact Sheet for Healthcare Providers: BankingDealers.co.za This test is not yet approved or cleared by the [place] FDA and has been authorized for detection and/or diagnosis of SARS-CoV-2 by FDA under an Emergency Use Authorization (EUA).  This EUA will remain in effect (meaning this test can be used) for the duration of the COVID-19 declaration under Section 564(b)(1) of the Act, 21 U.S.C. section 360bbb-3(b)(1), unless the authorization is terminated or revoked sooner. Performed at [facility], [place] [address], [place], [place] [address]   MRSA PCR Screening     Status: Abnormal   Collection Time: 05/25/19 10:25 PM   Specimen: Nasal Mucosa; Nasopharyngeal  Result Value Ref Range Status   MRSA by PCR POSITIVE (A) NEGATIVE Final    Comment:        The GeneXpert MRSA Assay (FDA approved for NASAL specimens only), is one component of a comprehensive MRSA colonization surveillance program. It is not intended to diagnose MRSA infection nor to guide or monitor treatment for MRSA infections. RESULT CALLED TO, READ BACK BY AND VERIFIED WITH: Jani Gravel RN 05/26/19 0032 JDW Performed at [facility], [place] [address], [place], [place] [address]     Procedures and diagnostic studies:  No results found.  Medications:   . apixaban  5 mg Oral BID  . docusate sodium  100 mg Oral BID  . fluticasone  2 spray  Each Nare QHS  . hydrocerin   Topical Daily  . levothyroxine  125 mcg Oral QAC breakfast  . oxyCODONE  40 mg Oral Q12H  . potassium chloride  40 mEq Oral BID  . spironolactone  25 mg Oral Daily   Continuous Infusions: . furosemide (LASIX) infusion 15 mg/hr (06/02/19 0500)  . potassium chloride 10 mEq (06/02/19 0901)     LOS: 8 days   Signature  Lala Lund M.D on 06/02/2019 at 9:43 AM   -  To page go to [email or web address]

## 2019-06-02 NOTE — Progress Notes (Signed)
Md made aware of potassium of 3.3 .  40 MEQ Potassium already on MAR for 2200.  Will continue to monitor pt. Saunders Revel T

## 2019-06-02 NOTE — Plan of Care (Signed)

## 2019-06-02 NOTE — Progress Notes (Signed)
PT will self administer Cpap, it is his home equipment and is familiar with machine.

## 2019-06-03 LAB — BASIC METABOLIC PANEL
Anion gap: 10 (ref 5–15)
Anion gap: 5 (ref 5–15)
Anion gap: 11 (ref 5–15)
BUN: 24 mg/dL — ABNORMAL HIGH (ref 8–23)
BUN: 22 mg/dL (ref 8–23)
BUN: 23 mg/dL (ref 8–23)
CO2: 33 mmol/L — ABNORMAL HIGH (ref 22–32)
CO2: 30 mmol/L (ref 22–32)
CO2: 30 mmol/L (ref 22–32)
Calcium: 8.5 mg/dL — ABNORMAL LOW (ref 8.9–10.3)
Calcium: 8.3 mg/dL — ABNORMAL LOW (ref 8.9–10.3)
Calcium: 8.7 mg/dL — ABNORMAL LOW (ref 8.9–10.3)
Chloride: 91 mmol/L — ABNORMAL LOW (ref 98–111)
Chloride: 92 mmol/L — ABNORMAL LOW (ref 98–111)
Chloride: 92 mmol/L — ABNORMAL LOW (ref 98–111)
Creatinine, Ser: 1.24 mg/dL (ref 0.61–1.24)
Creatinine, Ser: 1.26 mg/dL — ABNORMAL HIGH (ref 0.61–1.24)
Creatinine, Ser: 1.34 mg/dL — ABNORMAL HIGH (ref 0.61–1.24)
GFR calc Af Amer: >60 mL/min (ref >60)
GFR calc Af Amer: >60 mL/min (ref >60)
GFR calc Af Amer: >60 mL/min (ref >60)
GFR calc non Af Amer: >60 mL/min (ref >60)
GFR calc non Af Amer: 59 mL/min — ABNORMAL LOW (ref >60)
GFR calc non Af Amer: 55 mL/min — ABNORMAL LOW (ref >60)
Glucose, Bld: 115 mg/dL — ABNORMAL HIGH (ref 70–99)
Glucose, Bld: 153 mg/dL — ABNORMAL HIGH (ref 70–99)
Glucose, Bld: 172 mg/dL — ABNORMAL HIGH (ref 70–99)
Potassium: 3.3 mmol/L — ABNORMAL LOW (ref 3.5–5.1)
Potassium: <2 mmol/L — CL (ref 3.5–5.1)
Potassium: 3.6 mmol/L (ref 3.5–5.1)
Sodium: 134 mmol/L — ABNORMAL LOW (ref 135–145)
Sodium: 127 mmol/L — ABNORMAL LOW (ref 135–145)
Sodium: 133 mmol/L — ABNORMAL LOW (ref 135–145)

## 2019-06-03 LAB — CBC
HCT: 37.4 % — ABNORMAL LOW (ref 39.0–52.0)
Hemoglobin: 11.5 g/dL — ABNORMAL LOW (ref 13.0–17.0)
MCH: 31.9 pg (ref 26.0–34.0)
MCHC: 30.7 g/dL (ref 30.0–36.0)
MCV: 103.6 fL — ABNORMAL HIGH (ref 80.0–100.0)
Platelets: 79 10*3/uL — ABNORMAL LOW (ref 150–400)
RBC: 3.61 MIL/uL — ABNORMAL LOW (ref 4.22–5.81)
RDW: 18.2 % — ABNORMAL HIGH (ref 11.5–15.5)
WBC: 5.3 10*3/uL (ref 4.0–10.5)
nRBC: 0 % (ref 0.0–0.2)

## 2019-06-03 LAB — COOXEMETRY PANEL
Carboxyhemoglobin: 2.1 % — ABNORMAL HIGH (ref 0.5–1.5)
Methemoglobin: 0.7 % (ref 0.0–1.5)
O2 Saturation: 61.6 %
Total hemoglobin: 11.9 g/dL — ABNORMAL LOW (ref 12.0–16.0)

## 2019-06-03 LAB — MAGNESIUM: Magnesium: 2.1 mg/dL (ref 1.7–2.4)

## 2019-06-03 MED ORDER — POTASSIUM CHLORIDE 10 MEQ/100ML IV SOLN: 10.0000 meq | INTRAVENOUS | Status: AC

## 2019-06-03 MED ORDER — POTASSIUM CHLORIDE CRYS ER 20 MEQ PO TBCR
60.0000 meq | EXTENDED_RELEASE_TABLET | Freq: Once | ORAL | Status: AC
  Administered 2019-06-03: 60 meq via ORAL

## 2019-06-03 MED ORDER — FUROSEMIDE 10 MG/ML IJ SOLN
20.0000 mg/h | INTRAVENOUS | Status: DC
  Administered 2019-06-03: 15 mg/h via INTRAVENOUS
  Administered 2019-06-04 – 2019-06-08 (×7): 20 mg/h via INTRAVENOUS
  Filled 2019-06-03: qty 21
  Filled 2019-06-03 (×4): qty 25
  Filled 2019-06-03: qty 21
  Filled 2019-06-03: qty 25
  Filled 2019-06-03: qty 20
  Filled 2019-06-03: qty 2

## 2019-06-03 MED ORDER — POTASSIUM CHLORIDE CRYS ER 20 MEQ PO TBCR
40.0000 meq | EXTENDED_RELEASE_TABLET | Freq: Four times a day (QID) | ORAL | Status: DC
  Administered 2019-06-03 – 2019-06-08 (×20): 40 meq via ORAL
  Filled 2019-06-03 (×20): qty 2

## 2019-06-03 MED ORDER — POTASSIUM CHLORIDE CRYS ER 10 MEQ PO TBCR
EXTENDED_RELEASE_TABLET | ORAL | Status: AC
  Filled 2019-06-03: qty 6

## 2019-06-03 MED ORDER — POTASSIUM CHLORIDE 10 MEQ/100ML IV SOLN
INTRAVENOUS | Status: AC
  Administered 2019-06-03: 10 meq
  Filled 2019-06-03: qty 600

## 2019-06-03 MED ORDER — POTASSIUM CHLORIDE 10 MEQ/100ML IV SOLN
10.0000 meq | INTRAVENOUS | Status: DC
  Administered 2019-06-03 (×2): 10 meq via INTRAVENOUS

## 2019-06-03 MED ORDER — POTASSIUM CHLORIDE 10 MEQ/100ML IV SOLN
10.0000 meq | INTRAVENOUS | Status: AC
  Administered 2019-06-03 (×4): 10 meq via INTRAVENOUS
  Filled 2019-06-03 (×4): qty 100

## 2019-06-03 MED ORDER — POTASSIUM CHLORIDE CRYS ER 20 MEQ PO TBCR: 60.0000 meq | EXTENDED_RELEASE_TABLET | Freq: Three times a day (TID) | ORAL | Status: DC

## 2019-06-03 MED ORDER — ACETAZOLAMIDE 250 MG PO TABS
250.0000 mg | ORAL_TABLET | Freq: Every day | ORAL | Status: DC
  Administered 2019-06-04 – 2019-06-07 (×4): 250 mg via ORAL
  Filled 2019-06-03 (×4): qty 1

## 2019-06-03 NOTE — Progress Notes (Signed)
Bilateral lower extremity wound care completed. Kurlex with ace wrap 2 each per leg. Cleansed and applied cream.  Pt tolerated well. Saunders Revel T

## 2019-06-03 NOTE — Progress Notes (Signed)
Pt has potassium infusing presently, still awaiting  Results of rept.  BMP, orders made for lasix IV to be discontinued at this time.

## 2019-06-03 NOTE — Progress Notes (Signed)
Progress Note    Albert James  EHM:[id number] DOB: 07-31-1956  DOA: 05/25/2019 PCP: Mayra Neer, MD    Brief Narrative:   Chief complaint: Follow-up heart failure.  Medical records reviewed and are as summarized below:  Albert James is an 63 y.o. male with a PMH of chronic diastolic CHF, severe RV dysfunction, severe pulmonary hypertension, COPD, chronic respiratory failure with hypoxia on 2 L of nasal cannula oxygen, atrial fibrillation on Eliquis, cirrhosis, leukopenia, stage III CKD, non-Hodgkin's lymphoma in remission, hypothyroidism, and OSA/OHS on nocturnal CPAP who was admitted 05/25/2019 for evaluation of worsening shortness of breath associated with weight gain despite attempts to manage as an outpatient with adjustment of home diuretic dosing.  Chest x-ray on admission showed cardiomegaly with pulmonary vascular congestion and he was given a dose of IV Lasix in the ED.  Subjective:   Patient in bed, appears comfortable, denies any headache, no fever, no chest pain or pressure, no shortness of breath , no abdominal pain. No focal weakness.  Assessment/Plan:   Principal Problem:   Acute on chronic diastolic (congestive) heart failure (HCC) in the setting of severe pulmonary hypertension/acute on chronic respiratory failure with hypoxia  Admitted with decompensated heart failure and placed on IV diuretics.  Noted to have had a 60-65 pound weight gain over the past 3 months.  CHF team following the patient, repeat echocardiogram done this admission on 05/26/2019 showed a preserved EF of 60 to 65% but increased right ventricular systolic pressure of 96 mmHg.  VQ scan confirms chronic PEs.  He is already on Eliquis for that which will be continued.  Currently on high-dose IV Lasix drip along with Aldactone and now Diamox as well, monitor intake and output, monitor electrolytes closely.  So far 22 L negative balance as of 06/02/2019.  Note his estimated dry weight is  around 385 pounds currently he is around 415 pounds.   Intake/Output Summary (Last 24 hours) at 06/03/2019 1132 Last data filed at 06/03/2019 1100 Gross per 24 hour  Intake 1704.49 ml  Output 6001 ml  Net -4296.51 ml   Filed Weights   06/01/19 0801 06/02/19 0159 06/03/19 0412  Weight: (!) 190.1 kg (!) 189.6 kg (!) 189.3 kg          Chronic/permanent atrial fibrillation with [place] vas 2 score of at least 3 - Anticoagulated with Eliquis.  Nadolol held on admission secondary to hypotension and bradycardia.  Continue to monitor.   Cirrhosis ([place]) - Suspected NASH versus congestive hepatopathy from right heart failure.  Managed with Lasix and lactulose.   Thrombocytopenia ([place]) - Secondary to cirrhosis.  Monitor closely for signs of bleeding given concurrent use of Eliquis. Platelets trending down over the course of admission, 74 today.   OSA on CPAP - Continue CPAP at bedtime.   COPD (chronic obstructive pulmonary disease) (HCC) - Continue supplemental oxygen and bronchodilators.   Hypothyroidism - Continue Synthroid, TSH WNL.   Hypokalemia - Replaced IV and oral will continue to monitor.   Chronic pain - Continue OxyContin 40 mg every 12 hours and oxycodone 20 mg as needed every 8 hours for breakthrough pain.  [place] controlled substance database queried with last prescriptions written 05/16/2019.    Morbid obesity Body mass index is 58.21 kg/m.  Dietitian provided weight loss counseling 05/31/19.   Family Communication/Anticipated D/C date and plan/Code Status   DVT prophylaxis: Eliquis ordered. Code Status: Full Code.  Family Communication: Daughter updated by telephone by previous  MD. Disposition Plan: Inpatient appropriate, not stable for d/c yet.  Will need to be further diuresed.   Medical Consultants:    Cardiology   Anti-Infectives:    None   Objective:    Vitals:   06/03/19 0412 06/03/19 0447 06/03/19 0742 06/03/19 1117  BP:  97/63 105/68  94/64  Pulse:  67 76 67  Resp:  16 15 19   Temp:  97.7 F (36.5 C) 97.8 F (36.6 C) 98.1 F (36.7 C)  TempSrc:  Axillary Oral Oral  SpO2:  100% 97% 97%  Weight: (!) 189.3 kg     Height:        Intake/Output Summary (Last 24 hours) at 06/03/2019 1132 Last data filed at 06/03/2019 1100 Gross per 24 hour  Intake 1704.49 ml  Output 6001 ml  Net -4296.51 ml   Filed Weights   06/01/19 0801 06/02/19 0159 06/03/19 0412  Weight: (!) 190.1 kg (!) 189.6 kg (!) 189.3 kg    Exam:  Awake Alert, Oriented X 3, No new F.N deficits, Normal affect [place].AT,PERRAL Supple Neck,No JVD, No cervical lymphadenopathy appriciated.  Symmetrical Chest wall movement, Good air movement bilaterally, CTAB RRR,No Gallops, Rubs or new Murmurs, No Parasternal Heave +ve B.Sounds, Abd Soft, No tenderness, No organomegaly appriciated, No rebound - guarding or rigidity. No Cyanosis, Clubbing,  2+ leg edema with UNNA boots, No new Rash or bruise   Data Reviewed:   I have personally reviewed following labs and imaging studies:  Labs: Labs show the following:   Basic Metabolic Panel:  Recent Labs  Lab 05/28/19 0303  05/30/19 0500 05/31/19 0359 06/01/19 0352 06/02/19 0425 06/02/19 1900 06/03/19 0345  NA 138   < > 139 137 136 136 127* 134*  K 3.5   < > 3.2* 3.9 3.3* 2.7* 3.3* 3.3*  CL 102   < > 102 99 95* 94* 87* 91*  CO2 26   < > 26 27 30  32 28 33*  GLUCOSE 119*   < > 129* 121* 97 103* 280* 115*  BUN 27*   < > 19 24* 24* 25* 24* 24*  CREATININE 1.27*   < > 1.31* 1.48* 1.26* 1.24 1.28* 1.24  CALCIUM 8.6*   < > 8.5* 8.6* 8.5* 8.1* 8.0* 8.5*  MG 2.3   < > 2.2 2.1 2.0 2.0  --  2.1  PHOS 3.3  --   --   --   --   --   --   --    < > = values in this interval not displayed.   GFR Estimated Creatinine Clearance: 101.6 mL/min (by C-G formula based on SCr of 1.24 mg/dL). Liver Function Tests: Recent Labs  Lab 05/28/19 0303  ALBUMIN 2.8*    CBC: Recent Labs  Lab 05/30/19 0500 05/31/19 0359 06/01/19  0352 06/02/19 0425 06/03/19 0345  WBC 6.8 7.5 6.1 5.4 5.3  HGB 12.8* 12.3* 12.0* 11.7* 11.5*  HCT 41.0 39.5 38.3* 37.3* 37.4*  MCV 105.7* 105.6* 102.4* 103.6* 103.6*  PLT 88* 73* 74* 76* 79*    Microbiology Recent Results (from the past 240 hour(s))  SARS Coronavirus 2 (CEPHEID- Performed in [facility] hospital lab), Hosp Order     Status: None   Collection Time: 05/25/19  7:41 PM   Specimen: Nasopharyngeal Swab  Result Value Ref Range Status   SARS Coronavirus 2 NEGATIVE NEGATIVE Final    Comment: (NOTE) If result is NEGATIVE SARS-CoV-2 target nucleic acids are NOT DETECTED. The SARS-CoV-2 RNA is generally detectable  in upper and lower  respiratory specimens during the acute phase of infection. The lowest  concentration of SARS-CoV-2 viral copies this assay can detect is 250  copies / mL. A negative result does not preclude SARS-CoV-2 infection  and should not be used as the sole basis for treatment or other  patient management decisions.  A negative result may occur with  improper specimen collection / handling, submission of specimen other  than nasopharyngeal swab, presence of viral mutation(s) within the  areas targeted by this assay, and inadequate number of viral copies  (<250 copies / mL). A negative result must be combined with clinical  observations, patient history, and epidemiological information. If result is POSITIVE SARS-CoV-2 target nucleic acids are DETECTED. The SARS-CoV-2 RNA is generally detectable in upper and lower  respiratory specimens dur ing the acute phase of infection.  Positive  results are indicative of active infection with SARS-CoV-2.  Clinical  correlation with patient history and other diagnostic information is  necessary to determine patient infection status.  Positive results do  not rule out bacterial infection or co-infection with other viruses. If result is PRESUMPTIVE POSTIVE SARS-CoV-2 nucleic acids MAY BE PRESENT.   A presumptive  positive result was obtained on the submitted specimen  and confirmed on repeat testing.  While 2019 novel coronavirus  (SARS-CoV-2) nucleic acids may be present in the submitted sample  additional confirmatory testing may be necessary for epidemiological  and / or clinical management purposes  to differentiate between  SARS-CoV-2 and other Sarbecovirus currently known to infect humans.  If clinically indicated additional testing with an alternate test  methodology [phone]) is advised. The SARS-CoV-2 RNA is generally  detectable in upper and lower respiratory sp ecimens during the acute  phase of infection. The expected result is Negative. Fact Sheet for Patients:  StrictlyIdeas.no Fact Sheet for Healthcare Providers: BankingDealers.co.za This test is not yet approved or cleared by the [place] FDA and has been authorized for detection and/or diagnosis of SARS-CoV-2 by FDA under an Emergency Use Authorization (EUA).  This EUA will remain in effect (meaning this test can be used) for the duration of the COVID-19 declaration under Section 564(b)(1) of the Act, 21 U.S.C. section 360bbb-3(b)(1), unless the authorization is terminated or revoked sooner. Performed at [facility], [place] [address], [place], [place] [address]   MRSA PCR Screening     Status: Abnormal   Collection Time: 05/25/19 10:25 PM   Specimen: Nasal Mucosa; Nasopharyngeal  Result Value Ref Range Status   MRSA by PCR POSITIVE (A) NEGATIVE Final    Comment:        The GeneXpert MRSA Assay (FDA approved for NASAL specimens only), is one component of a comprehensive MRSA colonization surveillance program. It is not intended to diagnose MRSA infection nor to guide or monitor treatment for MRSA infections. RESULT CALLED TO, READ BACK BY AND VERIFIED WITH: Jani Gravel RN 05/26/19 0032 JDW Performed at [facility], [place] [address] [place], [place] [address]      Procedures and diagnostic studies:  No results found.  Medications:   . acetaZOLAMIDE  250 mg Oral BID  . apixaban  5 mg Oral BID  . docusate sodium  100 mg Oral BID  . fluticasone  2 spray Each Nare QHS  . hydrocerin   Topical Daily  . levothyroxine  125 mcg Oral QAC breakfast  . oxyCODONE  40 mg Oral Q12H  . potassium chloride  40 mEq Oral Q6H  .  spironolactone  25 mg Oral Daily   Continuous Infusions: . furosemide (LASIX) infusion 15 mg/hr (06/03/19 0119)  . potassium chloride 10 mEq (06/03/19 1049)     LOS: 9 days   Signature  Lala Lund M.D on 06/03/2019 at 11:32 AM   -  To page go to [email or web address]

## 2019-06-03 NOTE — Progress Notes (Signed)
Md made aware of pt's potassium 3.3 this am. Will continue to monitor Saunders Revel T

## 2019-06-03 NOTE — Progress Notes (Addendum)
Progress Note  Patient Name: Albert James Date of Encounter: 06/03/2019  Primary Cardiologist: [name], MD  Subjective   Diamox added yesterday with marked increase in urine output but weight not changed much. Albert James is eating a lot of candy to keep his mouth from being too dry.  Albert James feels there is improvement. No CP. SOB persists but better. LEE slowly improving (still significantly overloaded).  Admit wt 446 Current weight 417 -> 417 Estimated dry weight 380-385  Inpatient Medications    Scheduled Meds:  acetaZOLAMIDE  250 mg Oral BID   apixaban  5 mg Oral BID   docusate sodium  100 mg Oral BID   fluticasone  2 spray Each Nare QHS   hydrocerin   Topical Daily   levothyroxine  125 mcg Oral QAC breakfast   oxyCODONE  40 mg Oral Q12H   potassium chloride  40 mEq Oral BID   spironolactone  25 mg Oral Daily   Continuous Infusions:  furosemide (LASIX) infusion 15 mg/hr (06/03/19 0119)   potassium chloride 10 mEq (06/03/19 0804)   PRN Meds: acetaminophen **OR** acetaminophen, albuterol, lactulose, naproxen sodium, oxyCODONE, polyvinyl alcohol, sodium chloride flush   Vital Signs    Vitals:   06/02/19 2300 06/03/19 0412 06/03/19 0447 06/03/19 0742  BP: 100/77  97/63 105/68  Pulse: 63  67 76  Resp: 16  16 15   Temp: 97.8 F (36.6 C)  97.7 F (36.5 C) 97.8 F (36.6 C)  TempSrc: Oral  Axillary Oral  SpO2: 92%  100% 97%  Weight:  (!) 189.3 kg    Height:        Intake/Output Summary (Last 24 hours) at 06/03/2019 0840 Last data filed at 06/03/2019 0742 Gross per 24 hour  Intake 1404.49 ml  Output 5701 ml  Net -4296.51 ml   Last 3 Weights 06/03/2019 06/02/2019 06/01/2019  Weight (lbs) 417 lb 5.3 oz 417 lb 15.9 oz 419 lb 1.5 oz  Weight (kg) 189.3 kg 189.6 kg 190.1 kg     Telemetry    Atrial fib CVR - Personally Reviewed  Physical Exam   GEN: No acute distress, morbidly obese lying about 30 degrees in bed without acute dyspnea HEENT:  Normocephalic, atraumatic, sclera anicteric. Neck: JVP to ear. No carotid bruits. Cardiac: Irregularly irregular, distant heart sounds, rate controlled, no murmurs, rubs, or gallops.  Radials/DP/PT 1+ and equal bilaterally.  Respiratory: Diminished but otherwise clear to auscultation bilaterally. Breathing is unlabored on O2  GI: Large panus/very obese. Soft, nontender, non-distended, BS +x 4. MS: no deformity. Extremities: No clubbing or cyanosis. 2+ LEE superimposed on large baseline leg habitus/lymphedema and with unna wraps in place Neuro:  AAOx3. Follows commands. Psych:  Responds to questions appropriately with a normal affect.  Labs    Chemistry Recent Labs  Lab 05/28/19 0303  06/02/19 0425 06/02/19 1900 06/03/19 0345  NA 138   < > 136 127* 134*  K 3.5   < > 2.7* 3.3* 3.3*  CL 102   < > 94* 87* 91*  CO2 26   < > 32 28 33*  GLUCOSE 119*   < > 103* 280* 115*  BUN 27*   < > 25* 24* 24*  CREATININE 1.27*   < > 1.24 1.28* 1.24  CALCIUM 8.6*   < > 8.1* 8.0* 8.5*  ALBUMIN 2.8*  --   --   --   --   GFRNONAA 59*   < > >60 58* >60  GFRAA >60   < > >  60 >60 >60  ANIONGAP 10   < > 10 12 10    < > = values in this interval not displayed.     Hematology Recent Labs  Lab 06/01/19 0352 06/02/19 0425 06/03/19 0345  WBC 6.1 5.4 5.3  RBC 3.74* 3.60* 3.61*  HGB 12.0* 11.7* 11.5*  HCT 38.3* 37.3* 37.4*  MCV 102.4* 103.6* 103.6*  MCH 32.1 32.5 31.9  MCHC 31.3 31.4 30.7  RDW 18.5* 18.5* 18.2*  PLT 74* 76* 79*    Cardiac EnzymesNo results for input(s): TROPONINI in the last 168 hours. No results for input(s): TROPIPOC in the last 168 hours.   BNP Recent Labs  Lab 05/29/19 0433  BNP 413.4*     DDimer No results for input(s): DDIMER in the last 168 hours.   Radiology    No results found.  Cardiac Studies   2D echo 05/26/19  1. The left ventricle has normal systolic function with an ejection fraction of 60-65%. The cavity size was normal. There is moderately increased  left ventricular wall thickness. Left ventricular diastolic Doppler parameters are consistent with impaired  relaxation. There is right ventricular volume and pressure overload.  2. The right ventricle has moderately reduced systolic function. The cavity was moderately enlarged. There is no increase in right ventricular wall thickness. Right ventricular systolic pressure is severely elevated with an estimated pressure of 96.2  mmHg.  3. Left atrial size was mildly dilated.  4. Right atrial size was severely dilated.  5. The mitral valve is grossly normal. Mild thickening of the mitral valve leaflet.  6. Tricuspid valve regurgitation is severe.  7. The aortic valve is tricuspid. Mild thickening of the aortic valve. Mild calcification of the aortic valve.  8. Pulmonic valve regurgitation is mild to moderate is mild by color flow Doppler.  9. The inferior vena cava was dilated in size with <50% respiratory variability.  Patient Profile     63 y.o. male with a hx of permanent atrial fibrillation (since at least 2015), NASH with cirrhosis, super morbid obesity with suspected OHS/OSA, chronic diastolic CHF/RV failure, severe pulmonary HTN, severe COPD by PFTs 11/2017, chronic respiratory failure, anxiety, depression, CKD III, NHL in remission, varicose veins, chronic pain who was admitted with CHF and marked volume overload. Echo in 09/2017 showed EF 50-55%, septal flattening, severe RV dilation, PASP 77 mmHg. LHC/RHC in 1/19 showed no CAD, + evidence for RV failure and severe pulmonary hypertension. PFTs in 1/19 showed severe COPD. V/Q scan in 1/19 showed no evidence for chronic PEs. Sleep study showed severe OSA, using CPAP. His cirrhosis has been felt due to NASH vs congestive hepatopathy from RV failure, but cannot totally r/o component of portopulmonary HTN (no prior ETOH). Albert James was admitted with at least 60lb fluid overload in the setting of recent AKI/medication adjustments.  His most recent 2D echo  05/26/19 showed EF 60-65%, moderate LVH, + impaired diastolic relaxation, +RV volume and pressure overload, moderately reduced RV function, moderately enlarged RV cavity, RVSP of 56mHg, mildly dilated LA, severely dilated RA, severe tricuspid regurgitation, mild-moderate pulmonic regurg, dilated IVC.   Assessment & Plan    1. Acute on chronic diastolic CHF with prominent RV failure, pulmonary HTN, and severe TR - Suspect RV failure is triggered by pulmonary hypertension. Underlying OHS/OSA and severe COPD have likely caused pulmonary hypertension/RV failure. Continues to diurese on Lasix gtt at 1108mhr. Received metolazone 7/14, 7/15, 7/16 - I/O's suggest Albert James's diuresing well without further dosing at this time (-4.5L UOP  alone yesterday). Weight did not change much yesterday to today. Now on diamox as well. Co-ox 61, CVP reported at 8 but sounds like from notes there has been discordance in chart measurement and MD measurement. Albert James remains 32lb above baseline weight. Will continue present regimen and review with Dr. Haroldine Laws.  2. Acute on chronic respiratory failure with intermittent hypoxia - multifactorial likely due to CHF, OHS. COPD. Remains stable today. Will need ambulatory sat eval when euvolemic to ensure sending out on new baseline L/min.  3. Pulmonary HTN with super morbid obesity with OHS/OSA - Severe by echo and RHC (pulmonary arterial hypertension). Suspect group 3 disease due to COPD (severe obstruction on PFTs) and OSA/OHS. Prior V/Q scan not suggestive of chronic PEs. Albert James is on home oxygen and CPAP. Pulmonary vasodilators not felt to be useful in this situation. Consider referral to [facility] for closer accountability at [place].  4. CKD stage III - Cr improved with diuresis, continue to follow with anticipated med change. Spironolactone resumed; BP low-normal. Cr holding steady. DC naproxen from [facility].  5. Permanent atrial fibrillation - HR is controlled. Continue  Eliquis - although there has been previous question of switching to coumadin given his severe obesity to be able to adequately monitor therapeutic response. Eliquis has been continued as inpatient. Was on nadolol as an outpatient; I cannot really find another indication for this med. It is on hold.  6. Hypokalemia - K 3.3. Yesterday received total of potassium 138mq PO + 80 mEQ IV (2049m total). Today is written for 8036morally + 40 mEq IV. Will increase oral dose to 65m78m6hours and keep plan for IV as well. Check K level around 2pm. If still suboptimal, will need to increase KCl to 65me49mhours.  7. Hyponatremia - improved. Continue daily BMET.  For questions or updates, please contact [facility] consult [email or web address] for contact info under Cardiology/STEMI.  Signed, DaynaCharlie PitterC 06/03/2019, 8:40 AM    Patient seen and examined with the above-signed Advanced Practice Provider and/or Housestaff. I personally reviewed laboratory data, imaging studies and relevant notes. I independently examined the patient and formulated the important aspects of the plan. I have edited the note to reflect any of my changes or salient points. I have personally discussed the plan with the patient and/or family.  Albert James remains markedly overloaded. Urine output increased markedly with diamox. Will continue. Continue lasix gtt. Supp K aggressively. Remains on Eliquis without bleeding. Fluid restrict.   DanieGlori Bickers 10:50 AM

## 2019-06-03 NOTE — Progress Notes (Addendum)
CRITICAL VALUE ALERT  Critical Value: pOTASSIUM ,<2  Date & Time Notied:  06/03/19@1735   Provider Notified: Dr Candiss Norse  Orders Received/Actions taken: Give 8m .orally , rept potassium levels , then start 6 runs potassium  Intravenously.

## 2019-06-03 NOTE — Plan of Care (Signed)

## 2019-06-04 LAB — CBC
HCT: 36.9 % — ABNORMAL LOW (ref 39.0–52.0)
Hemoglobin: 11.7 g/dL — ABNORMAL LOW (ref 13.0–17.0)
MCH: 32.6 pg (ref 26.0–34.0)
MCHC: 31.7 g/dL (ref 30.0–36.0)
MCV: 102.8 fL — ABNORMAL HIGH (ref 80.0–100.0)
Platelets: 83 10*3/uL — ABNORMAL LOW (ref 150–400)
RBC: 3.59 MIL/uL — ABNORMAL LOW (ref 4.22–5.81)
RDW: 18.3 % — ABNORMAL HIGH (ref 11.5–15.5)
WBC: 5.1 10*3/uL (ref 4.0–10.5)
nRBC: 0 % (ref 0.0–0.2)

## 2019-06-04 LAB — BASIC METABOLIC PANEL
Anion gap: 10 (ref 5–15)
BUN: 24 mg/dL — ABNORMAL HIGH (ref 8–23)
CO2: 29 mmol/L (ref 22–32)
Calcium: 8.6 mg/dL — ABNORMAL LOW (ref 8.9–10.3)
Chloride: 96 mmol/L — ABNORMAL LOW (ref 98–111)
Creatinine, Ser: 1.34 mg/dL — ABNORMAL HIGH (ref 0.61–1.24)
GFR calc Af Amer: >60 mL/min (ref >60)
GFR calc non Af Amer: 55 mL/min — ABNORMAL LOW (ref >60)
Glucose, Bld: 117 mg/dL — ABNORMAL HIGH (ref 70–99)
Potassium: 3.7 mmol/L (ref 3.5–5.1)
Sodium: 135 mmol/L (ref 135–145)

## 2019-06-04 LAB — MAGNESIUM: Magnesium: 2.2 mg/dL (ref 1.7–2.4)

## 2019-06-04 LAB — COOXEMETRY PANEL
Carboxyhemoglobin: 2.4 % — ABNORMAL HIGH (ref 0.5–1.5)
Methemoglobin: 1.1 % (ref 0.0–1.5)
O2 Saturation: 53.1 %
Total hemoglobin: 11.2 g/dL — ABNORMAL LOW (ref 12.0–16.0)

## 2019-06-04 MED ORDER — TRAMADOL HCL 50 MG PO TABS
50.0000 mg | ORAL_TABLET | Freq: Once | ORAL | Status: DC
  Filled 2019-06-04 (×2): qty 1

## 2019-06-04 NOTE — Care Management Important Message (Signed)
Important Message  Patient Details  Name: Albert James MRN: [id number] Date of Birth: 1956/02/08   Medicare Important Message Given:  Yes     Memory Argue 06/04/2019, 3:36 PM

## 2019-06-04 NOTE — TOC Initial Note (Addendum)
Transition of Care [facility]) - Initial/Assessment Note    Patient Details  Name: Albert James MRN: [id number] Date of Birth: 1956-05-06  Transition of Care [facility]) CM/SW Contact:    Candie Chroman, LCSW Phone Number: 06/04/2019, 9:43 AM  Clinical Narrative: CSW met with patient, introduced role, and explained that therapy recommendations would be discussed. Patient agreeable to home health PT, OT, RN, and aide. First preference is [facility]. He has worked with them in the past. Left voicemail for their representative. Second preference is [facility]. Patient uses a rollator at home. He states he has home oxygen at 2 L but has been using 4 L here. May need new walking sats eval to determine if patient needs to go home on more oxygen. Patient reports he gets his oxygen through [facility] No further concerns. CSW encouraged patient to contact CSW as needed. CSW will continue to follow patient and facilitate return home once stable.                 12:04 pm: Spoke to [employer] and made referral.  Expected Discharge Plan: [place] Barriers to Discharge: Continued Medical Work up   Patient Goals and CMS Choice   CMS Medicare.gov Compare Post Acute Care list provided to:: Patient    Expected Discharge Plan and Services Expected Discharge Plan: [place] Choice: [place] arrangements for the past 2 months: Single Family Home Expected Discharge Date: 06/08/19                         HH Arranged: RN, PT, OT, Nurse's Aide          Prior Living Arrangements/Services Living arrangements for the past 2 months: Single Family Home Lives with:: Adult Children Patient language and need for interpreter reviewed:: Yes Do you feel safe going back to the place where you live?: Yes      Need for Family Participation in Patient Care: Yes (Comment) Care giver support system in place?: Yes  (comment) Current home services: DME, Other (comment)(RN through [facility])) Criminal Activity/Legal Involvement Pertinent to Current Situation/Hospitalization: No - Comment as needed  Activities of Daily Living Home Assistive Devices/Equipment: Grab bars around toilet, Bedside commode/3-in-1, Eyeglasses, Oxygen, CPAP, Walker (specify type), Raised toilet seat with rails ADL Screening (condition at time of admission) Patient's cognitive ability adequate to safely complete daily activities?: Yes Is the patient deaf or have difficulty hearing?: No Does the patient have difficulty seeing, even when wearing glasses/contacts?: No Does the patient have difficulty concentrating, remembering, or making decisions?: No Patient able to express need for assistance with ADLs?: Yes Does the patient have difficulty dressing or bathing?: Yes Independently performs ADLs?: No Communication: Independent Is this a change from baseline?: Pre-admission baseline Dressing (OT): Needs assistance Is this a change from baseline?: Pre-admission baseline Grooming: Needs assistance Is this a change from baseline?: Pre-admission baseline Feeding: Independent Bathing: Needs assistance Is this a change from baseline?: Pre-admission baseline Toileting: Independent In/Out Bed: Independent Walks in Home: Needs assistance Is this a change from baseline?: Pre-admission baseline Does the patient have difficulty walking or climbing stairs?: Yes Weakness of Legs: Both Weakness of Arms/Hands: Both  Permission Sought/Granted Permission sought to share information with : Facility Art therapist granted to share information with : Yes, Verbal Permission Granted     Permission granted to share info w AGENCY: [place]  Emotional Assessment Appearance:: Appears stated age Attitude/Demeanor/Rapport: Engaged, Gracious Affect (typically observed): Accepting, Appropriate, Calm,  Pleasant Orientation: : Oriented to Self, Oriented to Place, Oriented to  Time, Oriented to Situation Alcohol / Substance Use: Never Used Psych Involvement: No (comment)  Admission diagnosis:  Acute right-sided heart failure (HCC) [I50.811] Acute respiratory failure with hypoxia ([place]) [J96.01] Patient Active Problem List   Diagnosis Date Noted  . Hypokalemia 05/30/2019  . Hypothyroidism 05/30/2019  . Acute on chronic diastolic (congestive) heart failure ([place]) 05/25/2019  . Cirrhosis ([place])   . Pulmonary hypertension ([place])   . Thrombocytopenia ([place])   . OSA on CPAP   . COPD (chronic obstructive pulmonary disease) ([place])   . Chronic respiratory failure with hypoxia ([place])   . Acute cor pulmonale ([place]) 12/25/2018  . Pressure injury of skin 12/25/2018  . CHF exacerbation ([place]) 12/23/2018  . Chronic anticoagulation   . Chronic atrial fibrillation   . Hyponatremia   . Hypotension   . Chronic diastolic heart failure ([place]) 12/20/2017  . Special screening for malignant neoplasms, colon 10/24/2014  . Complete rotator cuff tear of left shoulder 07/12/2014  . Complete rotator cuff tear 07/12/2014  . Rotator cuff dysfunction 07/08/2014  . Atrial fibrillation, persistent 07/08/2014  . Varicose veins of lower extremities with other complications 85/63/1497  . Venous reflux 03/27/2014  . Ulcer of lower limb ([place]) 03/27/2014  . Chronic venous insufficiency 03/27/2014  . Personal history of lymphoma 04/17/2013  . Chronic back pain 04/17/2013  . Acquired lymphedema of leg 04/17/2013  . Osteomyelitis of vertebra ([place]) 04/17/2013   PCP:  Mayra Neer, MD Pharmacy:   [facility], [place] - [address] AT [place] [place] [address] Phone: [phone] Fax: [phone]     Social Determinants of Health (SDOH) Interventions    Readmission Risk Interventions No flowsheet data found.

## 2019-06-04 NOTE — Progress Notes (Addendum)
Progress Note  Patient Name: Albert James Date of Encounter: 06/04/2019  Primary Cardiologist: [name], MD  Subjective  Admit wt 446  Weight does not seem accurate. Negative >2 liters. Continues to diurese with lasix drip at 15 mg per hour + diamox.   Denies SOB. Orthopnea or PND. Feels weak. Has been giving the staff a hard time    Inpatient Medications    Scheduled Meds:  acetaZOLAMIDE  250 mg Oral Daily   apixaban  5 mg Oral BID   docusate sodium  100 mg Oral BID   fluticasone  2 spray Each Nare QHS   hydrocerin   Topical Daily   levothyroxine  125 mcg Oral QAC breakfast   oxyCODONE  40 mg Oral Q12H   potassium chloride  40 mEq Oral Q6H   spironolactone  25 mg Oral Daily   Continuous Infusions:  furosemide (LASIX) infusion 15 mg/hr (06/03/19 1931)   PRN Meds: acetaminophen **OR** acetaminophen, albuterol, lactulose, oxyCODONE, polyvinyl alcohol, sodium chloride flush   Vital Signs    Vitals:   06/03/19 1936 06/04/19 0108 06/04/19 0320 06/04/19 0801  BP: 91/63 93/76 108/63 98/61  Pulse: (!) 54 72 62 71  Resp: (!) 21 17 (!) 22 18  Temp: 97.8 F (36.6 C) (!) 97.5 F (36.4 C) (!) 97.5 F (36.4 C) 98.4 F (36.9 C)  TempSrc: Oral Oral Axillary Oral  SpO2: 100% 94% 94% 99%  Weight:   (!) 198.1 kg   Height:        Intake/Output Summary (Last 24 hours) at 06/04/2019 0836 Last data filed at 06/04/2019 0803 Gross per 24 hour  Intake 1100 ml  Output 2250 ml  Net -1150 ml   Last 3 Weights 06/04/2019 06/03/2019 06/02/2019  Weight (lbs) 436 lb 11.7 oz 417 lb 5.3 oz 417 lb 15.9 oz  Weight (kg) 198.1 kg 189.3 kg 189.6 kg     Telemetry    A Fib 60-70s Personally reviewed   Physical Exam  CVP 14-15  General:  No resp difficulty. Sitting in the chair.  HEENT: normal Neck: supple. JVP to jaw . Carotids 2+ bilat; no bruits. No lymphadenopathy or thryomegaly appreciated. Cor: PMI nondisplaced. Irregular  rate & rhythm. No rubs, gallops or  murmurs. Lungs: clear Abdomen: massively obese  soft, nontender, nondistended. No bruits or masses. Good bowel sounds. Extremities: no cyanosis, clubbing, rash, R and LLE leg wraps. 1-2+ edema on top of severe lymphedema Neuro: alert & orientedx3, cranial nerves grossly intact. moves all 4 extremities w/o difficulty. Affect pleasant   Labs    Chemistry Recent Labs  Lab 06/03/19 1507 06/03/19 1741 06/04/19 0500  NA 127* 133* 135  K <2.0* 3.6 3.7  CL 92* 92* 96*  CO2 30 30 29   GLUCOSE 153* 172* 117*  BUN 22 23 24*  CREATININE 1.26* 1.34* 1.34*  CALCIUM 8.3* 8.7* 8.6*  GFRNONAA 59* 55* 55*  GFRAA >60 >60 >60  ANIONGAP 5 11 10      Hematology Recent Labs  Lab 06/02/19 0425 06/03/19 0345 06/04/19 0500  WBC 5.4 5.3 5.1  RBC 3.60* 3.61* 3.59*  HGB 11.7* 11.5* 11.7*  HCT 37.3* 37.4* 36.9*  MCV 103.6* 103.6* 102.8*  MCH 32.5 31.9 32.6  MCHC 31.4 30.7 31.7  RDW 18.5* 18.2* 18.3*  PLT 76* 79* 83*    Cardiac EnzymesNo results for input(s): TROPONINI in the last 168 hours. No results for input(s): TROPIPOC in the last 168 hours.   BNP Recent Labs  Lab  05/29/19 0433  BNP 413.4*     DDimer No results for input(s): DDIMER in the last 168 hours.   Radiology    No results found.  Cardiac Studies   2D echo 05/26/19  1. The left ventricle has normal systolic function with an ejection fraction of 60-65%. The cavity size was normal. There is moderately increased left ventricular wall thickness. Left ventricular diastolic Doppler parameters are consistent with impaired  relaxation. There is right ventricular volume and pressure overload.  2. The right ventricle has moderately reduced systolic function. The cavity was moderately enlarged. There is no increase in right ventricular wall thickness. Right ventricular systolic pressure is severely elevated with an estimated pressure of 96.2  mmHg.  3. Left atrial size was mildly dilated.  4. Right atrial size was severely  dilated.  5. The mitral valve is grossly normal. Mild thickening of the mitral valve leaflet.  6. Tricuspid valve regurgitation is severe.  7. The aortic valve is tricuspid. Mild thickening of the aortic valve. Mild calcification of the aortic valve.  8. Pulmonic valve regurgitation is mild to moderate is mild by color flow Doppler.  9. The inferior vena cava was dilated in size with <50% respiratory variability.  Patient Profile     63 y.o. male with a hx of permanent atrial fibrillation (since at least 2015), NASH with cirrhosis, super morbid obesity with suspected OHS/OSA, chronic diastolic CHF/RV failure, severe pulmonary HTN, severe COPD by PFTs 11/2017, chronic respiratory failure, anxiety, depression, CKD III, NHL in remission, varicose veins, chronic pain who was admitted with CHF and marked volume overload. Echo in 09/2017 showed EF 50-55%, septal flattening, severe RV dilation, PASP 77 mmHg. LHC/RHC in 1/19 showed no CAD, + evidence for RV failure and severe pulmonary hypertension. PFTs in 1/19 showed severe COPD. V/Q scan in 1/19 showed no evidence for chronic PEs. Sleep study showed severe OSA, using CPAP. His cirrhosis has been felt due to NASH vs congestive hepatopathy from RV failure, but cannot totally r/o component of portopulmonary HTN (no prior ETOH). He was admitted with at least 60lb fluid overload in the setting of recent AKI/medication adjustments.  His most recent 2D echo 05/26/19 showed EF 60-65%, moderate LVH, + impaired diastolic relaxation, +RV volume and pressure overload, moderately reduced RV function, moderately enlarged RV cavity, RVSP of 73mHg, mildly dilated LA, severely dilated RA, severe tricuspid regurgitation, mild-moderate pulmonic regurg, dilated IVC.   Assessment & Plan    1. Acute on chronic diastolic CHF with prominent RV failure, pulmonary HTN, and severe TR - Suspect RV failure is triggered by pulmonary hypertension. Underlying OHS/OSA and severe COPD have  likely caused pulmonary hypertension/RV failure.  Volume status remains elevated. Increase lasix drip to 20 mg per hour.  Renal function stable.   2. Acute on chronic respiratory failure with intermittent hypoxia - multifactorial likely due to CHF, OHS. COPD. Remains stable today. Will need ambulatory sat eval when euvolemic to ensure sending out on new baseline L/min.  3. Pulmonary HTN with super morbid obesity with OHS/OSA - Severe by echo and RHC (pulmonary arterial hypertension). Suspect group 3 disease due to COPD (severe obstruction on PFTs) and OSA/OHS. Prior V/Q scan not suggestive of chronic PEs. He is on home oxygen and CPAP. Pulmonary vasodilators not felt to be useful in this situation.   4. CKD stage III -  Renal function stable.   5. Permanent atrial fibrillation - HR is controlled. Continue Eliquis - although there has been previous question of  switching to coumadin given his severe obesity to be able to adequately monitor therapeutic response. Eliquis has been continued as inpatient.   6. Hypokalemia - K stable.   7. Hyponatremia - Resolved .   Jeanmarie Hubert, NP [phone]  06/04/2019, 8:36 AM     Patient seen and examined with the above-signed Advanced Practice Provider and/or Housestaff. I personally reviewed laboratory data, imaging studies and relevant notes. I independently examined the patient and formulated the important aspects of the plan. I have edited the note to reflect any of my changes or salient points. I have personally discussed the plan with the patient and/or family.  Volume status very hear to assess but CVP up and still edematous, Weight doesn't seem accurate. Will reweigh. Renal function stable. Increase lasix gtt to 20 and increase diamox to 500 bid. Can add metolazone as needed. Watch renal function closely with diuresis.   Glori Bickers, MD  9:13 AM

## 2019-06-04 NOTE — Progress Notes (Signed)
Physical Therapy Treatment Patient Details Name: Albert James MRN: [id number] DOB: [birth year] Today's Date: 06/04/2019    History of Present Illness MERRITT MCCRAVY is an 63 y.o. male with a PMH of chronic diastolic CHF, severe RV dysfunction, severe pulmonary hypertension, COPD, chronic respiratory failure with hypoxia on 2 L of nasal cannula oxygen, atrial fibrillation on Eliquis, cirrhosis, leukopenia, stage III CKD, non-Hodgkin's lymphoma in remission, hypothyroidism, and OSA/OHS on nocturnal CPAP who was admitted 05/25/2019 for evaluation of worsening shortness of breath associated with weight gain despite attempts to manage as an outpatient with adjustment of home diuretic dosing.    PT Comments    Pt admitted with above diagnosis. Pt currently with functional limitations due to balance and endurance deficits. Pt was able to ambulate with RW and does well for short distances with steady gait.  Pt VSS.  on 4LO2.   Pt will benefit from skilled PT to increase their independence and safety with mobility to allow discharge to the venue listed below.     Follow Up Recommendations  Home health PT;Supervision/Assistance - 24 hour     Equipment Recommendations  Other (comment)(Pt has been trying to get motorized wheelchair)    Recommendations for Other Services       Precautions / Restrictions Precautions Precautions: Fall Restrictions Weight Bearing Restrictions: No    Mobility  Bed Mobility               General bed mobility comments: Pt was in chair on arrival  Transfers Overall transfer level: Needs assistance Equipment used: Rolling walker (2 wheeled) Transfers: Sit to/from Stand Sit to Stand: Supervision         General transfer comment: Did not need assist to rise from chair.    Ambulation/Gait Ambulation/Gait assistance: Min guard;Supervision Gait Distance (Feet): 50 Feet Assistive device: Rolling walker (2 wheeled) Gait Pattern/deviations:  Decreased stride length;Shuffle;Step-to pattern;Decreased step length - right;Decreased step length - left;Antalgic;Drifts right/left;Wide base of support   Gait velocity interpretation: <1.31 ft/sec, indicative of household ambulator General Gait Details: Pt was able to ambulate with RW with no LOB and mostly supervision.  Min guard due to lines.  STeady with RW. Pt was on 4LO2 with sats >90%.  OTher VSS   Stairs             Wheelchair Mobility    Modified Rankin (Stroke Patients Only)       Balance Overall balance assessment: Needs assistance Sitting-balance support: No upper extremity supported;Feet supported Sitting balance-Leahy Scale: Fair     Standing balance support: Bilateral upper extremity supported;During functional activity Standing balance-Leahy Scale: Poor Standing balance comment: relies on UE support                            Cognition Arousal/Alertness: Awake/alert Behavior During Therapy: WFL for tasks assessed/performed Overall Cognitive Status: Within Functional Limits for tasks assessed                                        Exercises General Exercises - Lower Extremity Ankle Circles/Pumps: AROM;Both;10 reps;Supine Long Arc Quad: AROM;Both;10 reps;Seated    General Comments        Pertinent Vitals/Pain Pain Assessment: No/denies pain    Home Living  Prior Function            PT Goals (current goals can now be found in the care plan section) Acute Rehab PT Goals Patient Stated Goal: to go home Progress towards PT goals: Progressing toward goals    Frequency    Min 3X/week      PT Plan Current plan remains appropriate    Co-evaluation              AM-PAC PT "6 Clicks" Mobility   Outcome Measure  Help needed turning from your back to your side while in a flat bed without using bedrails?: None Help needed moving from lying on your back to sitting on the side  of a flat bed without using bedrails?: None Help needed moving to and from a bed to a chair (including a wheelchair)?: None Help needed standing up from a chair using your arms (e.g., wheelchair or bedside chair)?: None Help needed to walk in hospital room?: A Little Help needed climbing 3-5 steps with a railing? : A Little 6 Click Score: 22    End of Session Equipment Utilized During Treatment: Gait belt;Oxygen Activity Tolerance: Patient limited by fatigue Patient left: in chair;with call bell/phone within reach Nurse Communication: Mobility status PT Visit Diagnosis: Unsteadiness on feet (R26.81);Muscle weakness (generalized) (M62.81)     Time: 3435-6861 PT Time Calculation (min) (ACUTE ONLY): 19 min  Charges:  $Gait Training: 8-22 mins                     [place] Pager:  [phone]  Office:  [place] 06/04/2019, 2:59 PM

## 2019-06-04 NOTE — Progress Notes (Signed)
Bilateral leg dressings changed as ordered.

## 2019-06-04 NOTE — Progress Notes (Signed)
Progress Note    Albert James  XKG:[id number] DOB: 12-13-1955  DOA: 05/25/2019 PCP: Mayra Neer, MD    Brief Narrative:   Chief complaint: Follow-up heart failure.  Medical records reviewed and are as summarized below:  Albert James is an 63 y.o. male with a PMH of chronic diastolic CHF, severe RV dysfunction, severe pulmonary hypertension, COPD, chronic respiratory failure with hypoxia on 2 L of nasal cannula oxygen, atrial fibrillation on Eliquis, cirrhosis, leukopenia, stage III CKD, non-Hodgkin's lymphoma in remission, hypothyroidism, and OSA/OHS on nocturnal CPAP who was admitted 05/25/2019 for evaluation of worsening shortness of breath associated with weight gain despite attempts to manage as an outpatient with adjustment of home diuretic dosing.  Chest x-ray on admission showed cardiomegaly with pulmonary vascular congestion and he was given a dose of IV Lasix in the ED.  Subjective:   Patient in bed, appears comfortable, denies any headache, no fever, no chest pain or pressure, no shortness of breath , no abdominal pain. No focal weakness.   Assessment/Plan:   Principal Problem:   Acute on chronic diastolic (congestive) heart failure (HCC) in the setting of severe pulmonary hypertension/acute on chronic respiratory failure with hypoxia  Admitted with decompensated heart failure and placed on IV diuretics.  Noted to have had a 60-65 pound weight gain over the past 3 months.  CHF team following the patient, repeat echocardiogram done this admission on 05/26/2019 showed a preserved EF of 60 to 65% but increased right ventricular systolic pressure of 96 mmHg.  VQ scan confirms chronic PEs.  He is already on Eliquis for that which will be continued.  Currently on high-dose IV Lasix drip along with Aldactone and now Diamox as well, monitor intake and output, monitor electrolytes closely.  So far 28 L negative balance as of 06/04/2019.  Note his estimated dry weight is  around 385 pounds, question hospital weight [place] with 10 pound increase in the last 24 hours on 06/04/2019   Intake/Output Summary (Last 24 hours) at 06/04/2019 1018 Last data filed at 06/04/2019 0803 Gross per 24 hour  Intake 1173.11 ml  Output 3550 ml  Net -2376.89 ml   Filed Weights   06/02/19 0159 06/03/19 0412 06/04/19 0320  Weight: (!) 189.6 kg (!) 189.3 kg (!) 198.1 kg          Chronic/permanent atrial fibrillation with [place] vas 2 score of at least 3 - Anticoagulated with Eliquis.  Nadolol held on admission secondary to hypotension and bradycardia.  Continue to monitor.   Cirrhosis ([place]) - Suspected NASH versus congestive hepatopathy from right heart failure.  Managed with Lasix and lactulose.   Thrombocytopenia ([place]) - Secondary to cirrhosis.  Monitor closely for signs of bleeding given concurrent use of Eliquis. Platelets trending down over the course of admission, 74 today.   OSA on CPAP - Continue CPAP at bedtime.   COPD (chronic obstructive pulmonary disease) (HCC) - Continue supplemental oxygen and bronchodilators.   Hypothyroidism - Continue Synthroid, TSH WNL.   Hypokalemia - aggressively replaced currently stable.   Chronic pain - Continue OxyContin 40 mg every 12 hours and oxycodone 20 mg as needed every 8 hours for breakthrough pain.  [place] controlled substance database queried with last prescriptions written 05/16/2019.   Morbid obesity - Body mass index is 60.91 kg/m.  Dietitian provided weight loss counseling 05/31/19.   Family Communication/Anticipated D/C date and plan/Code Status   DVT prophylaxis: Eliquis ordered. Code Status: Full Code.  Family Communication:  Daughter updated by telephone by previous MD. Disposition Plan: Inpatient appropriate, not stable for d/c yet.  Will need to be further diuresed.   Medical Consultants:    Cardiology   Anti-Infectives:    None   Objective:    Vitals:   06/03/19 1936 06/04/19 0108  06/04/19 0320 06/04/19 0801  BP: 91/63 93/76 108/63 98/61  Pulse: (!) 54 72 62 71  Resp: (!) 21 17 (!) 22 18  Temp: 97.8 F (36.6 C) (!) 97.5 F (36.4 C) (!) 97.5 F (36.4 C) 98.4 F (36.9 C)  TempSrc: Oral Oral Axillary Oral  SpO2: 100% 94% 94% 99%  Weight:   (!) 198.1 kg   Height:        Intake/Output Summary (Last 24 hours) at 06/04/2019 1018 Last data filed at 06/04/2019 0803 Gross per 24 hour  Intake 1173.11 ml  Output 3550 ml  Net -2376.89 ml   Filed Weights   06/02/19 0159 06/03/19 0412 06/04/19 0320  Weight: (!) 189.6 kg (!) 189.3 kg (!) 198.1 kg    Exam:  Awake Alert, Oriented X 3, No new F.N deficits, Normal affect [place].AT,PERRAL Supple Neck,No JVD, No cervical lymphadenopathy appriciated.  Symmetrical Chest wall movement, Good air movement bilaterally, CTAB RRR,No Gallops, Rubs or new Murmurs, No Parasternal Heave +ve B.Sounds, Abd Soft, No tenderness, No organomegaly appriciated, No rebound - guarding or rigidity. No Cyanosis, Clubbing or edema, chronic redness and swelling in the thigh areas bilaterally,   2+ leg edema with UNNA boots, No new Rash or bruise   Data Reviewed:   I have personally reviewed following labs and imaging studies:  Labs: Labs show the following:   Basic Metabolic Panel:  Recent Labs  Lab 05/31/19 0359 06/01/19 0352 06/02/19 0425 06/02/19 1900 06/03/19 0345 06/03/19 1507 06/03/19 1741 06/04/19 0500  NA 137 136 136 127* 134* 127* 133* 135  K 3.9 3.3* 2.7* 3.3* 3.3* <2.0* 3.6 3.7  CL 99 95* 94* 87* 91* 92* 92* 96*  CO2 27 30 32 28 33* 30 30 29   GLUCOSE 121* 97 103* 280* 115* 153* 172* 117*  BUN 24* 24* 25* 24* 24* 22 23 24*  CREATININE 1.48* 1.26* 1.24 1.28* 1.24 1.26* 1.34* 1.34*  CALCIUM 8.6* 8.5* 8.1* 8.0* 8.5* 8.3* 8.7* 8.6*  MG 2.1 2.0 2.0  --  2.1  --   --  2.2   GFR Estimated Creatinine Clearance: 96.7 mL/min (A) (by C-G formula based on SCr of 1.34 mg/dL (H)). Liver Function Tests: No results for input(s):  AST, ALT, ALKPHOS, BILITOT, PROT, ALBUMIN in the last 168 hours.  CBC: Recent Labs  Lab 05/31/19 0359 06/01/19 0352 06/02/19 0425 06/03/19 0345 06/04/19 0500  WBC 7.5 6.1 5.4 5.3 5.1  HGB 12.3* 12.0* 11.7* 11.5* 11.7*  HCT 39.5 38.3* 37.3* 37.4* 36.9*  MCV 105.6* 102.4* 103.6* 103.6* 102.8*  PLT 73* 74* 76* 79* 83*    Microbiology Recent Results (from the past 240 hour(s))  SARS Coronavirus 2 (CEPHEID- Performed in [place] hospital lab), Hosp Order     Status: None   Collection Time: 05/25/19  7:41 PM   Specimen: Nasopharyngeal Swab  Result Value Ref Range Status   SARS Coronavirus 2 NEGATIVE NEGATIVE Final    Comment: (NOTE) If result is NEGATIVE SARS-CoV-2 target nucleic acids are NOT DETECTED. The SARS-CoV-2 RNA is generally detectable in upper and lower  respiratory specimens during the acute phase of infection. The lowest  concentration of SARS-CoV-2 viral copies this assay can detect  is 250  copies / mL. A negative result does not preclude SARS-CoV-2 infection  and should not be used as the sole basis for treatment or other  patient management decisions.  A negative result may occur with  improper specimen collection / handling, submission of specimen other  than nasopharyngeal swab, presence of viral mutation(s) within the  areas targeted by this assay, and inadequate number of viral copies  (<250 copies / mL). A negative result must be combined with clinical  observations, patient history, and epidemiological information. If result is POSITIVE SARS-CoV-2 target nucleic acids are DETECTED. The SARS-CoV-2 RNA is generally detectable in upper and lower  respiratory specimens dur ing the acute phase of infection.  Positive  results are indicative of active infection with SARS-CoV-2.  Clinical  correlation with patient history and other diagnostic information is  necessary to determine patient infection status.  Positive results do  not rule out bacterial  infection or co-infection with other viruses. If result is PRESUMPTIVE POSTIVE SARS-CoV-2 nucleic acids MAY BE PRESENT.   A presumptive positive result was obtained on the submitted specimen  and confirmed on repeat testing.  While 2019 novel coronavirus  (SARS-CoV-2) nucleic acids may be present in the submitted sample  additional confirmatory testing may be necessary for epidemiological  and / or clinical management purposes  to differentiate between  SARS-CoV-2 and other Sarbecovirus currently known to infect humans.  If clinically indicated additional testing with an alternate test  methodology [id number]) is advised. The SARS-CoV-2 RNA is generally  detectable in upper and lower respiratory sp ecimens during the acute  phase of infection. The expected result is Negative. Fact Sheet for Patients:  StrictlyIdeas.no Fact Sheet for Healthcare Providers: BankingDealers.co.za This test is not yet approved or cleared by the [place] FDA and has been authorized for detection and/or diagnosis of SARS-CoV-2 by FDA under an Emergency Use Authorization (EUA).  This EUA will remain in effect (meaning this test can be used) for the duration of the COVID-19 declaration under Section 564(b)(1) of the Act, 21 U.S.C. section 360bbb-3(b)(1), unless the authorization is terminated or revoked sooner. Performed at [facility], [place] [address], [place], [place] [address]   MRSA PCR Screening     Status: Abnormal   Collection Time: 05/25/19 10:25 PM   Specimen: Nasal Mucosa; Nasopharyngeal  Result Value Ref Range Status   MRSA by PCR POSITIVE (A) NEGATIVE Final    Comment:        The GeneXpert MRSA Assay (FDA approved for NASAL specimens only), is one component of a comprehensive MRSA colonization surveillance program. It is not intended to diagnose MRSA infection nor to guide or monitor treatment for MRSA infections. RESULT CALLED TO,  READ BACK BY AND VERIFIED WITH: Jani Gravel RN 05/26/19 0032 JDW Performed at [facility], [place] [address] [place], [place] [address]     Procedures and diagnostic studies:  No results found.  Medications:   . acetaZOLAMIDE  250 mg Oral Daily  . apixaban  5 mg Oral BID  . docusate sodium  100 mg Oral BID  . fluticasone  2 spray Each Nare QHS  . hydrocerin   Topical Daily  . levothyroxine  125 mcg Oral QAC breakfast  . oxyCODONE  40 mg Oral Q12H  . potassium chloride  40 mEq Oral Q6H  . spironolactone  25 mg Oral Daily   Continuous Infusions: . furosemide (LASIX) infusion 20 mg/hr (06/04/19 0931)     LOS:  10 days   Signature  Lala Lund M.D on 06/04/2019 at 10:18 AM   -  To page go to [email or web address]

## 2019-06-05 ENCOUNTER — Encounter (HOSPITAL_COMMUNITY): Payer: Medicare Other

## 2019-06-05 LAB — BASIC METABOLIC PANEL
Anion gap: 11 (ref 5–15)
BUN: 24 mg/dL — ABNORMAL HIGH (ref 8–23)
CO2: 28 mmol/L (ref 22–32)
Calcium: 8.6 mg/dL — ABNORMAL LOW (ref 8.9–10.3)
Chloride: 96 mmol/L — ABNORMAL LOW (ref 98–111)
Creatinine, Ser: 1.35 mg/dL — ABNORMAL HIGH (ref 0.61–1.24)
GFR calc Af Amer: >60 mL/min (ref >60)
GFR calc non Af Amer: 55 mL/min — ABNORMAL LOW (ref >60)
Glucose, Bld: 201 mg/dL — ABNORMAL HIGH (ref 70–99)
Potassium: 3.8 mmol/L (ref 3.5–5.1)
Sodium: 135 mmol/L (ref 135–145)

## 2019-06-05 LAB — COOXEMETRY PANEL
Carboxyhemoglobin: 2.5 % — ABNORMAL HIGH (ref 0.5–1.5)
Methemoglobin: 1 % (ref 0.0–1.5)
O2 Saturation: 66.4 %
Total hemoglobin: 9.3 g/dL — ABNORMAL LOW (ref 12.0–16.0)

## 2019-06-05 LAB — CBC
HCT: 37.2 % — ABNORMAL LOW (ref 39.0–52.0)
Hemoglobin: 11.8 g/dL — ABNORMAL LOW (ref 13.0–17.0)
MCH: 32.5 pg (ref 26.0–34.0)
MCHC: 31.7 g/dL (ref 30.0–36.0)
MCV: 102.5 fL — ABNORMAL HIGH (ref 80.0–100.0)
Platelets: 85 10*3/uL — ABNORMAL LOW (ref 150–400)
RBC: 3.63 MIL/uL — ABNORMAL LOW (ref 4.22–5.81)
RDW: 18.3 % — ABNORMAL HIGH (ref 11.5–15.5)
WBC: 5 10*3/uL (ref 4.0–10.5)
nRBC: 0 % (ref 0.0–0.2)

## 2019-06-05 LAB — MAGNESIUM: Magnesium: 2.4 mg/dL (ref 1.7–2.4)

## 2019-06-05 MED ORDER — GERHARDT'S BUTT CREAM
1.0000 "application " | TOPICAL_CREAM | Freq: Four times a day (QID) | CUTANEOUS | Status: DC
  Administered 2019-06-06 – 2019-06-09 (×10): 1 via TOPICAL
  Filled 2019-06-05: qty 1

## 2019-06-05 NOTE — Progress Notes (Signed)
Progress Note    Albert James  [id number]:[id number] DOB: [birth year]  DOA: 05/25/2019 PCP: Mayra Neer, MD    Brief Narrative:   Chief complaint: Follow-up heart failure.  Medical records reviewed and are as summarized below:  Albert James is an 63 y.o. male with a PMH of chronic diastolic CHF, severe RV dysfunction, severe pulmonary hypertension, COPD, chronic respiratory failure with hypoxia on 2 L of nasal cannula oxygen, atrial fibrillation on Eliquis, cirrhosis, leukopenia, stage III CKD, non-Hodgkin's lymphoma in remission, hypothyroidism, and OSA/OHS on nocturnal CPAP who was admitted 05/25/2019 for evaluation of worsening shortness of breath associated with weight gain despite attempts to manage as an outpatient with adjustment of home diuretic dosing.  He was admitted for CHF, cards following and he remains on Lasix drip with good results for now.  Subjective:   Patient in bed, appears comfortable, denies any headache, no fever, no chest pain or pressure, no shortness of breath , no abdominal pain. No focal weakness.  Assessment/Plan:   Principal Problem:   Acute on chronic diastolic (congestive) heart failure (HCC) in the setting of severe pulmonary hypertension/acute on chronic respiratory failure with hypoxia  Admitted with decompensated heart failure and placed on IV diuretics.  Noted to have had a 60-65 pound weight gain over the past 3 months.  CHF team following the patient, repeat echocardiogram done this admission on 05/26/2019 showed a preserved EF of 60 to 65% but increased right ventricular systolic pressure of 96 mmHg.  VQ scan confirms chronic PEs.  He is already on Eliquis for that which will be continued.  Currently on high-dose IV Lasix drip along with Aldactone and now Diamox as well, monitor intake and output, monitor electrolytes closely.  So far 32 L negative balance as of 06/05/2019.  Note his estimated dry weight is around 385 pounds, question  hospital weight [place] with > 10 pound changes in the last 2 days   Intake/Output Summary (Last 24 hours) at 06/05/2019 0942 Last data filed at 06/05/2019 0756 Gross per 24 hour  Intake 871.76 ml  Output 5050 ml  Net -4178.24 ml   Filed Weights   06/03/19 0412 06/04/19 0320 06/05/19 0500  Weight: (!) 189.3 kg (!) 198.1 kg (!) 186.7 kg    Chronic/permanent atrial fibrillation with [place] vas 2 score of at least 3 - Anticoagulated with Eliquis.  Nadolol held on admission secondary to hypotension and bradycardia.  Continue to monitor.   Cirrhosis ([place]) - Suspected NASH versus congestive hepatopathy from right heart failure.  Managed with Lasix and lactulose.   Thrombocytopenia ([place]) - Secondary to cirrhosis.  Monitor closely for signs of bleeding given concurrent use of Eliquis. Platelets trending down over the course of admission, 74 today.   OSA on CPAP - Continue CPAP at bedtime.   COPD (chronic obstructive pulmonary disease) (HCC) - Continue supplemental oxygen and bronchodilators.   Hypothyroidism - Continue Synthroid, TSH WNL.   Hypokalemia - aggressively replaced currently stable.   Chronic pain - Continue OxyContin 40 mg every 12 hours and oxycodone 20 mg as needed every 8 hours for breakthrough pain.  [place] controlled substance database queried with last prescriptions written 05/16/2019.   Morbid obesity - Body mass index is 57.41 kg/m.  Dietitian provided weight loss counseling 05/31/19.  CKD 3 - close to baseline creat of (1.3)   Family Communication/Anticipated D/C date and plan/Code Status   DVT prophylaxis: Eliquis ordered. Code Status: Full Code.  Family Communication: None today.  Disposition Plan: Inpatient appropriate, not stable for d/c yet.  Will need to be further diuresed.   Medical Consultants:    Cardiology   Anti-Infectives:    None   Objective:    Vitals:   06/04/19 2332 06/05/19 0323 06/05/19 0500 06/05/19 0755  BP: 100/66 115/69   102/60  Pulse:  71  73  Resp: 15     Temp: 98.2 F (36.8 C) (!) 97.3 F (36.3 C)  98.5 F (36.9 C)  TempSrc:  Axillary  Oral  SpO2:  90%  94%  Weight:   (!) 186.7 kg   Height:        Intake/Output Summary (Last 24 hours) at 06/05/2019 0942 Last data filed at 06/05/2019 0756 Gross per 24 hour  Intake 871.76 ml  Output 5050 ml  Net -4178.24 ml   Filed Weights   06/03/19 0412 06/04/19 0320 06/05/19 0500  Weight: (!) 189.3 kg (!) 198.1 kg (!) 186.7 kg    Exam:  Awake Alert, Oriented X 3, No new F.N deficits, Normal affect [place].AT,PERRAL Supple Neck,No JVD, No cervical lymphadenopathy appriciated.  Symmetrical Chest wall movement, Good air movement bilaterally, CTAB RRR,No Gallops, Rubs or new Murmurs, No Parasternal Heave +ve B.Sounds, Abd Soft, No tenderness, No organomegaly appriciated, No rebound - guarding or rigidity. No Cyanosis,  chronic redness and swelling in the thigh areas bilaterally,   2+ leg edema with UNNA boots   Data Reviewed:   I have personally reviewed following labs and imaging studies:  Labs: Labs show the following:   Basic Metabolic Panel:  Recent Labs  Lab 06/01/19 0352 06/02/19 0425  06/03/19 0345 06/03/19 1507 06/03/19 1741 06/04/19 0500 06/05/19 0750  NA 136 136   < > 134* 127* 133* 135 135  K 3.3* 2.7*   < > 3.3* <2.0* 3.6 3.7 3.8  CL 95* 94*   < > 91* 92* 92* 96* 96*  CO2 30 32   < > 33* 30 30 29 28   GLUCOSE 97 103*   < > 115* 153* 172* 117* 201*  BUN 24* 25*   < > 24* 22 23 24* 24*  CREATININE 1.26* 1.24   < > 1.24 1.26* 1.34* 1.34* 1.35*  CALCIUM 8.5* 8.1*   < > 8.5* 8.3* 8.7* 8.6* 8.6*  MG 2.0 2.0  --  2.1  --   --  2.2 2.4   < > = values in this interval not displayed.   GFR Estimated Creatinine Clearance: 92.5 mL/min (A) (by C-G formula based on SCr of 1.35 mg/dL (H)). Liver Function Tests: No results for input(s): AST, ALT, ALKPHOS, BILITOT, PROT, ALBUMIN in the last 168 hours.  CBC: Recent Labs  Lab 06/01/19 0352  06/02/19 0425 06/03/19 0345 06/04/19 0500 06/05/19 0750  WBC 6.1 5.4 5.3 5.1 5.0  HGB 12.0* 11.7* 11.5* 11.7* 11.8*  HCT 38.3* 37.3* 37.4* 36.9* 37.2*  MCV 102.4* 103.6* 103.6* 102.8* 102.5*  PLT 74* 76* 79* 83* 85*    Microbiology No results found for this or any previous visit (from the past 240 hour(s)).  Procedures and diagnostic studies:  No results found.  Medications:   . acetaZOLAMIDE  250 mg Oral Daily  . apixaban  5 mg Oral BID  . docusate sodium  100 mg Oral BID  . fluticasone  2 spray Each Nare QHS  . hydrocerin   Topical Daily  . levothyroxine  125 mcg Oral QAC breakfast  . oxyCODONE  40 mg Oral Q12H  . potassium  chloride  40 mEq Oral Q6H  . spironolactone  25 mg Oral Daily  . traMADol  50 mg Oral Once   Continuous Infusions: . furosemide (LASIX) infusion 20 mg/hr (06/05/19 0231)     LOS: 11 days   Signature  Lala Lund M.D on 06/05/2019 at 9:42 AM   -  To page go to [email or web address]

## 2019-06-05 NOTE — Progress Notes (Signed)
Progress Note  Patient Name: Albert James Date of Encounter: 06/05/2019  Primary Cardiologist: [name], MD  Subjective  Admit wt 446-->411 (he says baseline weight is in 380s)  Yesterday lasix drip increased to 20 mg per hour and diamox was increased to 500 mg twice a day. Brisk diuresis noted.   Complaining of ongoing fatigue, SOB and PND. Has been inappropriate to staff.   Inpatient Medications    Scheduled Meds:  acetaZOLAMIDE  250 mg Oral Daily   apixaban  5 mg Oral BID   docusate sodium  100 mg Oral BID   fluticasone  2 spray Each Nare QHS   hydrocerin   Topical Daily   levothyroxine  125 mcg Oral QAC breakfast   oxyCODONE  40 mg Oral Q12H   potassium chloride  40 mEq Oral Q6H   spironolactone  25 mg Oral Daily   traMADol  50 mg Oral Once   Continuous Infusions:  furosemide (LASIX) infusion 20 mg/hr (06/05/19 0231)   PRN Meds: acetaminophen **OR** acetaminophen, albuterol, lactulose, oxyCODONE, polyvinyl alcohol, sodium chloride flush   Vital Signs    Vitals:   06/05/19 0500 06/05/19 0755 06/05/19 0800 06/05/19 1100  BP:  102/60  109/75  Pulse:  73 68 84  Resp:      Temp:  98.5 F (36.9 C)  98.2 F (36.8 C)  TempSrc:  Oral  Oral  SpO2:  94% 93% 98%  Weight: (!) 186.7 kg     Height:        Intake/Output Summary (Last 24 hours) at 06/05/2019 1551 Last data filed at 06/05/2019 1200 Gross per 24 hour  Intake 1159.52 ml  Output 5850 ml  Net -4690.48 ml   Last 3 Weights 06/05/2019 06/04/2019 06/03/2019  Weight (lbs) 411 lb 9.6 oz 436 lb 11.7 oz 417 lb 5.3 oz  Weight (kg) 186.7 kg 198.1 kg 189.3 kg     Telemetry    A Fib 70-80ss Personally reviewed   Physical Exam  CVP  9  General:  Obese male Sitting in the chair.  No resp difficulty HEENT: normal Neck: supple. JVP 8-9. Carotids 2+ bilat; no bruits. No lymphadenopathy or thryomegaly appreciated. Cor: PMI nondisplaced. Irregular rate & rhythm. No rubs, gallops or  murmurs. Lungs: clear Abdomen: markedly obese, soft, nontender, nondistended. No hepatosplenomegaly. No bruits or masses. Good bowel sounds. Extremities: no cyanosis, clubbing, rash, R and LLE compression dressing with chronic lymphedema. 1-2+ edema on top  Neuro: alert & orientedx3, cranial nerves grossly intact. moves all 4 extremities w/o difficulty. Affect pleasant   Labs    Chemistry Recent Labs  Lab 06/03/19 1741 06/04/19 0500 06/05/19 0750  NA 133* 135 135  K 3.6 3.7 3.8  CL 92* 96* 96*  CO2 30 29 28   GLUCOSE 172* 117* 201*  BUN 23 24* 24*  CREATININE 1.34* 1.34* 1.35*  CALCIUM 8.7* 8.6* 8.6*  GFRNONAA 55* 55* 55*  GFRAA >60 >60 >60  ANIONGAP 11 10 11      Hematology Recent Labs  Lab 06/03/19 0345 06/04/19 0500 06/05/19 0750  WBC 5.3 5.1 5.0  RBC 3.61* 3.59* 3.63*  HGB 11.5* 11.7* 11.8*  HCT 37.4* 36.9* 37.2*  MCV 103.6* 102.8* 102.5*  MCH 31.9 32.6 32.5  MCHC 30.7 31.7 31.7  RDW 18.2* 18.3* 18.3*  PLT 79* 83* 85*    Cardiac EnzymesNo results for input(s): TROPONINI in the last 168 hours. No results for input(s): TROPIPOC in the last 168 hours.   BNP No  results for input(s): BNP, PROBNP in the last 168 hours.   DDimer No results for input(s): DDIMER in the last 168 hours.   Radiology    No results found.  Cardiac Studies   2D echo 05/26/19  1. The left ventricle has normal systolic function with an ejection fraction of 60-65%. The cavity size was normal. There is moderately increased left ventricular wall thickness. Left ventricular diastolic Doppler parameters are consistent with impaired  relaxation. There is right ventricular volume and pressure overload.  2. The right ventricle has moderately reduced systolic function. The cavity was moderately enlarged. There is no increase in right ventricular wall thickness. Right ventricular systolic pressure is severely elevated with an estimated pressure of 96.2  mmHg.  3. Left atrial size was mildly  dilated.  4. Right atrial size was severely dilated.  5. The mitral valve is grossly normal. Mild thickening of the mitral valve leaflet.  6. Tricuspid valve regurgitation is severe.  7. The aortic valve is tricuspid. Mild thickening of the aortic valve. Mild calcification of the aortic valve.  8. Pulmonic valve regurgitation is mild to moderate is mild by color flow Doppler.  9. The inferior vena cava was dilated in size with <50% respiratory variability.  Patient Profile     63 y.o. male with a hx of permanent atrial fibrillation (since at least 2015), NASH with cirrhosis, super morbid obesity with suspected OHS/OSA, chronic diastolic CHF/RV failure, severe pulmonary HTN, severe COPD by PFTs 11/2017, chronic respiratory failure, anxiety, depression, CKD III, NHL in remission, varicose veins, chronic pain who was admitted with CHF and marked volume overload. Echo in 09/2017 showed EF 50-55%, septal flattening, severe RV dilation, PASP 77 mmHg. LHC/RHC in 1/19 showed no CAD, + evidence for RV failure and severe pulmonary hypertension. PFTs in 1/19 showed severe COPD. V/Q scan in 1/19 showed no evidence for chronic PEs. Sleep study showed severe OSA, using CPAP. His cirrhosis has been felt due to NASH vs congestive hepatopathy from RV failure, but cannot totally r/o component of portopulmonary HTN (no prior ETOH). He was admitted with at least 60lb fluid overload in the setting of recent AKI/medication adjustments.  His most recent 2D echo 05/26/19 showed EF 60-65%, moderate LVH, + impaired diastolic relaxation, +RV volume and pressure overload, moderately reduced RV function, moderately enlarged RV cavity, RVSP of 47mHg, mildly dilated LA, severely dilated RA, severe tricuspid regurgitation, mild-moderate pulmonic regurg, dilated IVC.   Assessment & Plan    1. Acute on chronic diastolic CHF with prominent RV failure, pulmonary HTN, and severe TR - Suspect RV failure is triggered by pulmonary  hypertension. Underlying OHS/OSA and severe COPD have likely caused pulmonary hypertension/RV failure.  Volume status remains elevated but slowly improving. CVP down to 9.  Continued  lasix drip to 20 mg per hour + diamox 500 mg twice a day.  Renal function stable.   2. Acute on chronic respiratory failure with intermittent hypoxia - multifactorial likely due to CHF, OHS. COPD. Remains stable today. Will need ambulatory sat eval when euvolemic to ensure sending out on new baseline L/min.  3. Pulmonary HTN with super morbid obesity with OHS/OSA - Severe by echo and RHC (pulmonary arterial hypertension). Suspect group 3 disease due to COPD (severe obstruction on PFTs) and OSA/OHS. Prior V/Q scan not suggestive of chronic PEs. He is on home oxygen and CPAP. Pulmonary vasodilators not felt to be useful in this situation.   4. CKD stage III -  Renal function stable.  5. Permanent atrial fibrillation - HR is controlled. Continue Eliquis - although there has been previous question of switching to coumadin given his severe obesity to be able to adequately monitor therapeutic response. Eliquis has been continued as inpatient.   6. Hypokalemia - Resolved.    7. Hyponatremia  Resolved.   Signed,  Darrick Grinder, NP   Patient seen and examined with the above-signed Advanced Practice Provider and/or Housestaff. I personally reviewed laboratory data, imaging studies and relevant notes. I independently examined the patient and formulated the important aspects of the plan. I have edited the note to reflect any of my changes or salient points. I have personally discussed the plan with the patient and/or family.  Lasix gtt and diamox increased yesterday. Urine output greatly increased but he quite symptomatic. Says he still has lots of fluid on board but CVP only 9. Creatinine stable. Will continue diuresis until creatinine bumps. Watch electrolytes.   Glori Bickers, MD  3:55 PM

## 2019-06-05 NOTE — Progress Notes (Signed)
Progress Note  Patient Name: Albert James Date of Encounter: 06/05/2019  Primary Cardiologist: [name], MD  Subjective  Admit wt 446-->411  Yesterday lasix drip increased to 20 mg per hour and diamox was increased to 500 mg twice a day. Brisk diuresis noted.   Complaining of ongoing fatigue.   Inpatient Medications    Scheduled Meds: . acetaZOLAMIDE  250 mg Oral Daily  . apixaban  5 mg Oral BID  . docusate sodium  100 mg Oral BID  . fluticasone  2 spray Each Nare QHS  . hydrocerin   Topical Daily  . levothyroxine  125 mcg Oral QAC breakfast  . oxyCODONE  40 mg Oral Q12H  . potassium chloride  40 mEq Oral Q6H  . spironolactone  25 mg Oral Daily  . traMADol  50 mg Oral Once   Continuous Infusions: . furosemide (LASIX) infusion 20 mg/hr (06/05/19 0231)   PRN Meds: acetaminophen **OR** acetaminophen, albuterol, lactulose, oxyCODONE, polyvinyl alcohol, sodium chloride flush   Vital Signs    Vitals:   06/04/19 2115 06/04/19 2332 06/05/19 0323 06/05/19 0500  BP: (!) 102/50 100/66 115/69   Pulse: 67  71   Resp: 19 15    Temp: 97.9 F (36.6 C) 98.2 F (36.8 C) (!) 97.3 F (36.3 C)   TempSrc: Oral  Axillary   SpO2: 95%  90%   Weight:    (!) 186.7 kg  Height:        Intake/Output Summary (Last 24 hours) at 06/05/2019 0722 Last data filed at 06/05/2019 0657 Gross per 24 hour  Intake 1484.87 ml  Output 6350 ml  Net -4865.13 ml   Last 3 Weights 06/05/2019 06/04/2019 06/03/2019  Weight (lbs) 411 lb 9.6 oz 436 lb 11.7 oz 417 lb 5.3 oz  Weight (kg) 186.7 kg 198.1 kg 189.3 kg     Telemetry    A Fib 60-70s Personally reviewed   Physical Exam  CVP  9  General:  Sitting in the chair.  No resp difficulty HEENT: normal Neck: supple. JVP 8-9. Carotids 2+ bilat; no bruits. No lymphadenopathy or thryomegaly appreciated. Cor: PMI nondisplaced. Irregular rate & rhythm. No rubs, gallops or murmurs. Lungs: clear Abdomen: obese, soft, nontender, nondistended. No  hepatosplenomegaly. No bruits or masses. Good bowel sounds. Extremities: no cyanosis, clubbing, rash, R and LLE compression dressing Neuro: alert & orientedx3, cranial nerves grossly intact. moves all 4 extremities w/o difficulty. Affect pleasant   Labs    Chemistry Recent Labs  Lab 06/03/19 1507 06/03/19 1741 06/04/19 0500  NA 127* 133* 135  K <2.0* 3.6 3.7  CL 92* 92* 96*  CO2 30 30 29   GLUCOSE 153* 172* 117*  BUN 22 23 24*  CREATININE 1.26* 1.34* 1.34*  CALCIUM 8.3* 8.7* 8.6*  GFRNONAA 59* 55* 55*  GFRAA >60 >60 >60  ANIONGAP 5 11 10      Hematology Recent Labs  Lab 06/02/19 0425 06/03/19 0345 06/04/19 0500  WBC 5.4 5.3 5.1  RBC 3.60* 3.61* 3.59*  HGB 11.7* 11.5* 11.7*  HCT 37.3* 37.4* 36.9*  MCV 103.6* 103.6* 102.8*  MCH 32.5 31.9 32.6  MCHC 31.4 30.7 31.7  RDW 18.5* 18.2* 18.3*  PLT 76* 79* 83*    Cardiac EnzymesNo results for input(s): TROPONINI in the last 168 hours. No results for input(s): TROPIPOC in the last 168 hours.   BNP No results for input(s): BNP, PROBNP in the last 168 hours.   DDimer No results for input(s): DDIMER in the last  168 hours.   Radiology    No results found.  Cardiac Studies   2D echo 05/26/19  1. The left ventricle has normal systolic function with an ejection fraction of 60-65%. The cavity size was normal. There is moderately increased left ventricular wall thickness. Left ventricular diastolic Doppler parameters are consistent with impaired  relaxation. There is right ventricular volume and pressure overload.  2. The right ventricle has moderately reduced systolic function. The cavity was moderately enlarged. There is no increase in right ventricular wall thickness. Right ventricular systolic pressure is severely elevated with an estimated pressure of 96.2  mmHg.  3. Left atrial size was mildly dilated.  4. Right atrial size was severely dilated.  5. The mitral valve is grossly normal. Mild thickening of the mitral valve  leaflet.  6. Tricuspid valve regurgitation is severe.  7. The aortic valve is tricuspid. Mild thickening of the aortic valve. Mild calcification of the aortic valve.  8. Pulmonic valve regurgitation is mild to moderate is mild by color flow Doppler.  9. The inferior vena cava was dilated in size with <50% respiratory variability.  Patient Profile     63 y.o. male with a hx of permanent atrial fibrillation (since at least 2015), NASH with cirrhosis, super morbid obesity with suspected OHS/OSA, chronic diastolic CHF/RV failure, severe pulmonary HTN, severe COPD by PFTs 11/2017, chronic respiratory failure, anxiety, depression, CKD III, NHL in remission, varicose veins, chronic pain who was admitted with CHF and marked volume overload. Echo in 09/2017 showed EF 50-55%, septal flattening, severe RV dilation, PASP 77 mmHg. LHC/RHC in 1/19 showed no CAD, + evidence for RV failure and severe pulmonary hypertension. PFTs in 1/19 showed severe COPD. V/Q scan in 1/19 showed no evidence for chronic PEs. Sleep study showed severe OSA, using CPAP. His cirrhosis has been felt due to NASH vs congestive hepatopathy from RV failure, but cannot totally r/o component of portopulmonary HTN (no prior ETOH). He was admitted with at least 60lb fluid overload in the setting of recent AKI/medication adjustments.  His most recent 2D echo 05/26/19 showed EF 60-65%, moderate LVH, + impaired diastolic relaxation, +RV volume and pressure overload, moderately reduced RV function, moderately enlarged RV cavity, RVSP of 33mHg, mildly dilated LA, severely dilated RA, severe tricuspid regurgitation, mild-moderate pulmonic regurg, dilated IVC.   Assessment & Plan    1. Acute on chronic diastolic CHF with prominent RV failure, pulmonary HTN, and severe TR - Suspect RV failure is triggered by pulmonary hypertension. Underlying OHS/OSA and severe COPD have likely caused pulmonary hypertension/RV failure.  Volume status remains elevated but  slowly improving. CVP down to 9.  Continued  lasix drip to 20 mg per hour + diamox 500 mg twice a day.  Renal function stable.   2. Acute on chronic respiratory failure with intermittent hypoxia - multifactorial likely due to CHF, OHS. COPD. Remains stable today. Will need ambulatory sat eval when euvolemic to ensure sending out on new baseline L/min.  3. Pulmonary HTN with super morbid obesity with OHS/OSA - Severe by echo and RHC (pulmonary arterial hypertension). Suspect group 3 disease due to COPD (severe obstruction on PFTs) and OSA/OHS. Prior V/Q scan not suggestive of chronic PEs. He is on home oxygen and CPAP. Pulmonary vasodilators not felt to be useful in this situation.   4. CKD stage III -  Renal function stable.   5. Permanent atrial fibrillation - HR is controlled. Continue Eliquis - although there has been previous question of switching to  coumadin given his severe obesity to be able to adequately monitor therapeutic response. Eliquis has been continued as inpatient.   6. Hypokalemia - Resolved.    7. Hyponatremia  Resolved.   Jeanmarie Hubert, NP [phone]  06/05/2019, 7:22 AM

## 2019-06-05 NOTE — Plan of Care (Signed)

## 2019-06-05 NOTE — Progress Notes (Signed)
BMP, CBC and magnesium drawn and sent to lab

## 2019-06-06 LAB — BASIC METABOLIC PANEL
Anion gap: 8 (ref 5–15)
BUN: 21 mg/dL (ref 8–23)
CO2: 28 mmol/L (ref 22–32)
Calcium: 8.4 mg/dL — ABNORMAL LOW (ref 8.9–10.3)
Chloride: 101 mmol/L (ref 98–111)
Creatinine, Ser: 1.15 mg/dL (ref 0.61–1.24)
GFR calc Af Amer: >60 mL/min (ref >60)
GFR calc non Af Amer: >60 mL/min (ref >60)
Glucose, Bld: 116 mg/dL — ABNORMAL HIGH (ref 70–99)
Potassium: 3.5 mmol/L (ref 3.5–5.1)
Sodium: 137 mmol/L (ref 135–145)

## 2019-06-06 LAB — CBC
HCT: 38.5 % — ABNORMAL LOW (ref 39.0–52.0)
Hemoglobin: 12.1 g/dL — ABNORMAL LOW (ref 13.0–17.0)
MCH: 32.2 pg (ref 26.0–34.0)
MCHC: 31.4 g/dL (ref 30.0–36.0)
MCV: 102.4 fL — ABNORMAL HIGH (ref 80.0–100.0)
Platelets: 98 10*3/uL — ABNORMAL LOW (ref 150–400)
RBC: 3.76 MIL/uL — ABNORMAL LOW (ref 4.22–5.81)
RDW: 18.3 % — ABNORMAL HIGH (ref 11.5–15.5)
WBC: 5.7 10*3/uL (ref 4.0–10.5)
nRBC: 0 % (ref 0.0–0.2)

## 2019-06-06 LAB — MAGNESIUM: Magnesium: 2.4 mg/dL (ref 1.7–2.4)

## 2019-06-06 MED ORDER — METOLAZONE 5 MG PO TABS
5.0000 mg | ORAL_TABLET | Freq: Once | ORAL | Status: AC
  Administered 2019-06-06: 5 mg via ORAL
  Filled 2019-06-06: qty 1

## 2019-06-06 MED ORDER — POTASSIUM CHLORIDE CRYS ER 20 MEQ PO TBCR
40.0000 meq | EXTENDED_RELEASE_TABLET | Freq: Once | ORAL | Status: AC
  Administered 2019-06-06: 40 meq via ORAL
  Filled 2019-06-06: qty 2

## 2019-06-06 NOTE — Progress Notes (Signed)
Pt nose piece to cpap was missing when pt went to put on cpap for tonight. This RN searched room and belongings and could not find the nose piece. Pt states he thinks it was wrapped up in his sheets when they were changed earlier in the day and will have daughter bring him another one in the morning.

## 2019-06-06 NOTE — Progress Notes (Addendum)
Progress Note  Patient Name: Albert James Date of Encounter: 06/06/2019  Primary Cardiologist: [name], MD  Subjective  Admit wt [phone]  (he says baseline weight is in 380s)  Continues to diurese with lasix drip + diamox. Brisk diuresis noted. CVP 13-14 in bed but weight only down 2 pounds   Remains SOB with exertion. Denies chest pain.  +orthopnea or PND  Inpatient Medications    Scheduled Meds:  acetaZOLAMIDE  250 mg Oral Daily   apixaban  5 mg Oral BID   docusate sodium  100 mg Oral BID   fluticasone  2 spray Each Nare QHS   Gerhardt's butt cream  1 application Topical QID   hydrocerin   Topical Daily   levothyroxine  125 mcg Oral QAC breakfast   oxyCODONE  40 mg Oral Q12H   potassium chloride  40 mEq Oral Q6H   spironolactone  25 mg Oral Daily   traMADol  50 mg Oral Once   Continuous Infusions:  furosemide (LASIX) infusion 20 mg/hr (06/06/19 0623)   PRN Meds: acetaminophen **OR** acetaminophen, albuterol, lactulose, oxyCODONE, polyvinyl alcohol, sodium chloride flush   Vital Signs    Vitals:   06/05/19 2020 06/05/19 2340 06/06/19 0247 06/06/19 0516  BP:  (!) 110/59 105/65   Pulse: 64 68 73   Resp: 20 (!) 25 16   Temp:  97.8 F (36.6 C) 98 F (36.7 C)   TempSrc:  Oral Axillary   SpO2: 94% 94% 99%   Weight:    (!) 185.7 kg  Height:        Intake/Output Summary (Last 24 hours) at 06/06/2019 0720 Last data filed at 06/06/2019 0500 Gross per 24 hour  Intake 1320 ml  Output 5500 ml  Net -4180 ml   Last 3 Weights 06/06/2019 06/05/2019 06/04/2019  Weight (lbs) 409 lb 6.3 oz 411 lb 9.6 oz 436 lb 11.7 oz  Weight (kg) 185.7 kg 186.7 kg 198.1 kg     Telemetry    A Fib 70-80s Personally reviewed  Physical Exam  CVP 14 in the bed.  General:  In bed.  No resp difficulty HEENT: normal Neck: supple. JVP 11-12 . Carotids 2+ bilat; no bruits. No lymphadenopathy or thryomegaly appreciated. Cor: PMI nondisplaced. Irregular rate &  rhythm. No rubs, gallops or murmurs. Lungs: clear Abdomen: obese, soft, nontender, nondistended. No hepatosplenomegaly. No bruits or masses. Good bowel sounds. Extremities: no cyanosis, clubbing, rash, R and LLE compression wraps.  Neuro: alert & orientedx3, cranial nerves grossly intact. moves all 4 extremities w/o difficulty. Affect pleasant   Labs    Chemistry Recent Labs  Lab 06/04/19 0500 06/05/19 0750 06/06/19 0430  NA 135 135 137  K 3.7 3.8 3.5  CL 96* 96* 101  CO2 29 28 28   GLUCOSE 117* 201* 116*  BUN 24* 24* 21  CREATININE 1.34* 1.35* 1.15  CALCIUM 8.6* 8.6* 8.4*  GFRNONAA 55* 55* >60  GFRAA >60 >60 >60  ANIONGAP 10 11 8      Hematology Recent Labs  Lab 06/04/19 0500 06/05/19 0750 06/06/19 0430  WBC 5.1 5.0 5.7  RBC 3.59* 3.63* 3.76*  HGB 11.7* 11.8* 12.1*  HCT 36.9* 37.2* 38.5*  MCV 102.8* 102.5* 102.4*  MCH 32.6 32.5 32.2  MCHC 31.7 31.7 31.4  RDW 18.3* 18.3* 18.3*  PLT 83* 85* 98*    Cardiac EnzymesNo results for input(s): TROPONINI in the last 168 hours. No results for input(s): TROPIPOC in the last 168 hours.   BNP No  results for input(s): BNP, PROBNP in the last 168 hours.   DDimer No results for input(s): DDIMER in the last 168 hours.   Radiology    No results found.  Cardiac Studies   2D echo 05/26/19  1. The left ventricle has normal systolic function with an ejection fraction of 60-65%. The cavity size was normal. There is moderately increased left ventricular wall thickness. Left ventricular diastolic Doppler parameters are consistent with impaired  relaxation. There is right ventricular volume and pressure overload.  2. The right ventricle has moderately reduced systolic function. The cavity was moderately enlarged. There is no increase in right ventricular wall thickness. Right ventricular systolic pressure is severely elevated with an estimated pressure of 96.2  mmHg.  3. Left atrial size was mildly dilated.  4. Right atrial size  was severely dilated.  5. The mitral valve is grossly normal. Mild thickening of the mitral valve leaflet.  6. Tricuspid valve regurgitation is severe.  7. The aortic valve is tricuspid. Mild thickening of the aortic valve. Mild calcification of the aortic valve.  8. Pulmonic valve regurgitation is mild to moderate is mild by color flow Doppler.  9. The inferior vena cava was dilated in size with <50% respiratory variability.  Patient Profile     63 y.o. male with a hx of permanent atrial fibrillation (since at least 2015), NASH with cirrhosis, super morbid obesity with suspected OHS/OSA, chronic diastolic CHF/RV failure, severe pulmonary HTN, severe COPD by PFTs 11/2017, chronic respiratory failure, anxiety, depression, CKD III, NHL in remission, varicose veins, chronic pain who was admitted with CHF and marked volume overload. Echo in 09/2017 showed EF 50-55%, septal flattening, severe RV dilation, PASP 77 mmHg. LHC/RHC in 1/19 showed no CAD, + evidence for RV failure and severe pulmonary hypertension. PFTs in 1/19 showed severe COPD. V/Q scan in 1/19 showed no evidence for chronic PEs. Sleep study showed severe OSA, using CPAP. His cirrhosis has been felt due to NASH vs congestive hepatopathy from RV failure, but cannot totally r/o component of portopulmonary HTN (no prior ETOH). He was admitted with at least 60lb fluid overload in the setting of recent AKI/medication adjustments.  His most recent 2D echo 05/26/19 showed EF 60-65%, moderate LVH, + impaired diastolic relaxation, +RV volume and pressure overload, moderately reduced RV function, moderately enlarged RV cavity, RVSP of 1mHg, mildly dilated LA, severely dilated RA, severe tricuspid regurgitation, mild-moderate pulmonic regurg, dilated IVC.   Assessment & Plan    1. Acute on chronic diastolic CHF with prominent RV failure, pulmonary HTN, and severe TR - Suspect RV failure is triggered by pulmonary hypertension. Underlying OHS/OSA and  severe COPD have likely caused pulmonary hypertension/RV failure.  CVP back 13-14 in the bed. Brisk diuresis noted.  Continued  lasix drip to 20 mg per hour + diamox  500 mg twice a day.  Renal function stable.   2. Acute on chronic respiratory failure with intermittent hypoxia - multifactorial likely due to CHF, OHS. COPD. Remains stable today. Will need ambulatory sat eval when euvolemic to ensure sending out on new baseline L/min.  3. Pulmonary HTN with super morbid obesity with OHS/OSA - Severe by echo and RHC (pulmonary arterial hypertension). Suspect group 3 disease due to COPD (severe obstruction on PFTs) and OSA/OHS. Prior V/Q scan not suggestive of chronic PEs. He is on home oxygen and CPAP. Pulmonary vasodilators not felt to be useful in this situation.   4. CKD stage III -  Renal function stable. BMET in  am.   5. Permanent atrial fibrillation - Rate controlled.  Continue Eliquis - although there has been previous question of switching to coumadin given his severe obesity to be able to adequately monitor therapeutic response. Eliquis has been continued as inpatient.   6. Hypokalemia - K 3.5 Supp K.    7. Hyponatremia  Resolved.   8. Deconditioning.  PT recommending HH. Order placed today.     Amy Clegg NP--C  7:25 AM  Patient seen and examined with the above-signed Advanced Practice Provider and/or Housestaff. I personally reviewed laboratory data, imaging studies and relevant notes. I independently examined the patient and formulated the important aspects of the plan. I have edited the note to reflect any of my changes or salient points. I have personally discussed the plan with the patient and/or family.  He remains very tenuous. Volume status remains elevated. Diuresing well on lasix gtt and diamox but weight not down much. Will add metolazone. Fluid restrict. Supp K. Renal function stable.   Glori Bickers, MD  8:42 AM

## 2019-06-06 NOTE — Progress Notes (Signed)
Physical Therapy Treatment Patient Details Name: Albert James MRN: [id number] DOB: May 05, 1956 Today's Date: 06/06/2019    History of Present Illness Albert James is an 63 y.o. male with a PMH of chronic diastolic CHF, severe RV dysfunction, severe pulmonary hypertension, COPD, chronic respiratory failure with hypoxia on 2 L of nasal cannula oxygen, atrial fibrillation on Eliquis, cirrhosis, leukopenia, stage III CKD, non-Hodgkin's lymphoma in remission, hypothyroidism, and OSA/OHS on nocturnal CPAP who was admitted 05/25/2019 for evaluation of worsening shortness of breath associated with weight gain despite attempts to manage as an outpatient with adjustment of home diuretic dosing.    PT Comments    Pt admitted with above diagnosis. Pt currently with functional limitations due to balance and endurance deficits. Pt was able to ambulate with RW with 2 standing rest breaks for pursed lip breathing as DOE up to 3/4.  Pt desat to 81% on 4LO2 with activity and took 3 minutes to return to 90% at end of walk.  Pt states this is close to his baseline.  Will continue to progress pt as able.  Pt will benefit from skilled PT to increase their independence and safety with mobility to allow discharge to the venue listed below.     Follow Up Recommendations  Home health PT;Supervision/Assistance - 24 hour     Equipment Recommendations  Other (comment)(Pt has been trying to get motorized wheelchair)    Recommendations for Other Services       Precautions / Restrictions Precautions Precautions: Fall Restrictions Weight Bearing Restrictions: No    Mobility  Bed Mobility               General bed mobility comments: Pt was in bathroom on toilet on arrival  Transfers Overall transfer level: Needs assistance Equipment used: Rolling walker (2 wheeled) Transfers: Sit to/from Stand Sit to Stand: Supervision         General transfer comment: Did not need assist to rise from  toilet and cleaned himself.  Ambulation/Gait Ambulation/Gait assistance: Min guard;Supervision Gait Distance (Feet): 80 Feet Assistive device: Rolling walker (2 wheeled) Gait Pattern/deviations: Decreased stride length;Shuffle;Step-to pattern;Decreased step length - right;Decreased step length - left;Antalgic;Drifts right/left;Wide base of support;Trunk flexed   Gait velocity interpretation: <1.31 ft/sec, indicative of household ambulator General Gait Details: Pt was able to ambulate with RW with no LOB and mostly supervision.  Min guard due to lines.  STeady with RW. Pt took 2 standing rest breaks where he would lean on the RW to rest and purse lip breathe.  Pt was on 4LO2 with sats down to 81% at end of walk.  Took 3 min to return to 90% and >.  OTher VSS   Stairs             Wheelchair Mobility    Modified Rankin (Stroke Patients Only)       Balance Overall balance assessment: Needs assistance Sitting-balance support: No upper extremity supported;Feet supported Sitting balance-Leahy Scale: Fair     Standing balance support: Bilateral upper extremity supported;During functional activity Standing balance-Leahy Scale: Poor Standing balance comment: relies on UE support                            Cognition Arousal/Alertness: Awake/alert Behavior During Therapy: WFL for tasks assessed/performed Overall Cognitive Status: Within Functional Limits for tasks assessed  Exercises      General Comments        Pertinent Vitals/Pain Pain Assessment: No/denies pain    Home Living                      Prior Function            PT Goals (current goals can now be found in the care plan section) Acute Rehab PT Goals Patient Stated Goal: to go home Progress towards PT goals: Progressing toward goals    Frequency    Min 3X/week      PT Plan Current plan remains appropriate     Co-evaluation              AM-PAC PT "6 Clicks" Mobility   Outcome Measure  Help needed turning from your back to your side while in a flat bed without using bedrails?: None Help needed moving from lying on your back to sitting on the side of a flat bed without using bedrails?: None Help needed moving to and from a bed to a chair (including a wheelchair)?: None Help needed standing up from a chair using your arms (e.g., wheelchair or bedside chair)?: None Help needed to walk in hospital room?: A Little Help needed climbing 3-5 steps with a railing? : A Little 6 Click Score: 22    End of Session Equipment Utilized During Treatment: Gait belt;Oxygen Activity Tolerance: Patient limited by fatigue Patient left: in chair;with call bell/phone within reach Nurse Communication: Mobility status PT Visit Diagnosis: Unsteadiness on feet (R26.81);Muscle weakness (generalized) (M62.81)     Time: 5035-4656 PT Time Calculation (min) (ACUTE ONLY): 13 min  Charges:  $Gait Training: 8-22 mins                     [place] Pager:  [phone]  Office:  [place] 06/06/2019, 11:11 AM

## 2019-06-06 NOTE — Progress Notes (Signed)
PROGRESS NOTE    Albert James  GQQ:[id number] DOB: 12/31/1955 DOA: 05/25/2019 PCP: Mayra Neer, MD   Brief Narrative:   Albert James is an 63 y.o. male with a PMH of chronic diastolic CHF, severe RV dysfunction, severe pulmonary hypertension, COPD, chronic respiratory failure with hypoxia on 2 L of nasal cannula oxygen, atrial fibrillation on Eliquis, cirrhosis, leukopenia, stage III CKD, non-Hodgkin's lymphoma in remission, hypothyroidism, and OSA/OHS on nocturnal CPAP who was admitted 05/25/2019 for evaluation of worsening shortness of breath associated with weight gain despite attempts to manage as an outpatient with adjustment of home diuretic dosing.  He was admitted for acute on chronic diastolic CHF, cards following and he remains on Lasix drip with good results for now.  Consultants:   Cardiology  Procedures:   None  Antimicrobials:   None   Subjective: Patient seen and examined.  He states that he feels that he is getting better every day but very slowly.  He tells me that he does have some shortness of breath at home however currently he is not back to baseline.  No new complaint.  Objective: Vitals:   06/06/19 0247 06/06/19 0516 06/06/19 0753 06/06/19 1226  BP: 105/65  97/65 114/71  Pulse: 73  92 70  Resp: 16  20 18   Temp: 98 F (36.7 C)  97.6 F (36.4 C) 98.4 F (36.9 C)  TempSrc: Axillary  Oral Oral  SpO2: 99%  96%   Weight:  (!) 185.7 kg    Height:        Intake/Output Summary (Last 24 hours) at 06/06/2019 1401 Last data filed at 06/06/2019 0800 Gross per 24 hour  Intake 960 ml  Output 5300 ml  Net -4340 ml   Filed Weights   06/04/19 0320 06/05/19 0500 06/06/19 0516  Weight: (!) 198.1 kg (!) 186.7 kg (!) 185.7 kg    Examination:  General exam: Appears calm and comfortable, morbidly obese Respiratory system: Crackles at the bases bilaterally. Respiratory effort normal. Cardiovascular system: S1 & S2 heard, RRR. No JVD, murmurs,  rubs, gallops or clicks.  +2-3 pedal edema bilaterally Gastrointestinal system: Abdomen is nondistended, soft and nontender. No organomegaly or masses felt. Normal bowel sounds heard. Central nervous system: Alert and oriented. No focal neurological deficits. Extremities: Symmetric 5 x 5 power. Skin: No rashes, lesions or ulcers Psychiatry: Judgement and insight appear normal. Mood & affect appropriate.    Data Reviewed: I have personally reviewed following labs and imaging studies  CBC: Recent Labs  Lab 06/02/19 0425 06/03/19 0345 06/04/19 0500 06/05/19 0750 06/06/19 0430  WBC 5.4 5.3 5.1 5.0 5.7  HGB 11.7* 11.5* 11.7* 11.8* 12.1*  HCT 37.3* 37.4* 36.9* 37.2* 38.5*  MCV 103.6* 103.6* 102.8* 102.5* 102.4*  PLT 76* 79* 83* 85* 98*   Basic Metabolic Panel: Recent Labs  Lab 06/02/19 0425  06/03/19 0345 06/03/19 1507 06/03/19 1741 06/04/19 0500 06/05/19 0750 06/06/19 0430  NA 136   < > 134* 127* 133* 135 135 137  K 2.7*   < > 3.3* <2.0* 3.6 3.7 3.8 3.5  CL 94*   < > 91* 92* 92* 96* 96* 101  CO2 32   < > 33* 30 30 29 28 28   GLUCOSE 103*   < > 115* 153* 172* 117* 201* 116*  BUN 25*   < > 24* 22 23 24* 24* 21  CREATININE 1.24   < > 1.24 1.26* 1.34* 1.34* 1.35* 1.15  CALCIUM 8.1*   < > 8.5*  8.3* 8.7* 8.6* 8.6* 8.4*  MG 2.0  --  2.1  --   --  2.2 2.4 2.4   < > = values in this interval not displayed.   GFR: Estimated Creatinine Clearance: 108.2 mL/min (by C-G formula based on SCr of 1.15 mg/dL). Liver Function Tests: No results for input(s): AST, ALT, ALKPHOS, BILITOT, PROT, ALBUMIN in the last 168 hours. No results for input(s): LIPASE, AMYLASE in the last 168 hours. No results for input(s): AMMONIA in the last 168 hours. Coagulation Profile: No results for input(s): INR, PROTIME in the last 168 hours. Cardiac Enzymes: No results for input(s): CKTOTAL, CKMB, CKMBINDEX, TROPONINI in the last 168 hours. BNP (last 3 results) No results for input(s): PROBNP in the last  8760 hours. HbA1C: No results for input(s): HGBA1C in the last 72 hours. CBG: No results for input(s): GLUCAP in the last 168 hours. Lipid Profile: No results for input(s): CHOL, HDL, LDLCALC, TRIG, CHOLHDL, LDLDIRECT in the last 72 hours. Thyroid Function Tests: No results for input(s): TSH, T4TOTAL, FREET4, T3FREE, THYROIDAB in the last 72 hours. Anemia Panel: No results for input(s): VITAMINB12, FOLATE, FERRITIN, TIBC, IRON, RETICCTPCT in the last 72 hours. Sepsis Labs: No results for input(s): PROCALCITON, LATICACIDVEN in the last 168 hours.  No results found for this or any previous visit (from the past 240 hour(s)).    Radiology Studies: No results found.  Scheduled Meds: . acetaZOLAMIDE  250 mg Oral Daily  . apixaban  5 mg Oral BID  . docusate sodium  100 mg Oral BID  . fluticasone  2 spray Each Nare QHS  . Gerhardt's butt cream  1 application Topical QID  . hydrocerin   Topical Daily  . levothyroxine  125 mcg Oral QAC breakfast  . oxyCODONE  40 mg Oral Q12H  . potassium chloride  40 mEq Oral Q6H  . spironolactone  25 mg Oral Daily  . traMADol  50 mg Oral Once   Continuous Infusions: . furosemide (LASIX) infusion 20 mg/hr (06/06/19 0623)     LOS: 12 days   Assessment & Plan:   Principal Problem:   Acute on chronic diastolic (congestive) heart failure (HCC) Active Problems:   Chronic back pain   Chronic atrial fibrillation   Cirrhosis ([place])   Pulmonary hypertension (HCC)   Thrombocytopenia (HCC)   OSA on CPAP   COPD (chronic obstructive pulmonary disease) (HCC)   Chronic respiratory failure with hypoxia (HCC)   Hypokalemia   Hypothyroidism   Acute on chronic hypoxic respiratory failure secondary to acute on chronic diastolic congestive heart failure/severe pulmonary hypertension: Admitted with decompensated heart failure and placed on IV diuretics.  He has gained approximately 60 to 65 pounds in 3 months according to him.  CHF team following.  Repeat echo  done on 05/25/2089 showed preserved ejection fraction of 60 to 65% but increased right ventricular systolic pressure of 96.  VQ scan confirms chronic PE however he is already on Eliquis.  He remains on IV Lasix drip as well as Aldactone and Diamox managed by cardiology.  Monitoring strict I's and O's.  So far he is -3852 3 mL since admission.  Chronic atrial fibrillation: Rate controlled despite of nadolol being on hold.  Continue Eliquis.  Nash with liver cirrhosis: Stable.  OSA on CPAP: Continue CPAP at night.  COPD: None in exacerbation.  Continue bronchodilator.  Hypothyroidism: Continue Synthroid.  Chronic pain: Continue OxyContin and oxycodone as it is.  CKD stage III: Baseline creatinine at about 1.3.  Currently at baseline.  Continue to watch.  Chronic thrombocytopenia: Secondary to cirrhosis.  Current platelets stable at 98.  No signs of bleeding.  DVT prophylaxis: Eliquis Code Status: Full code Family Communication: None present at bedside.  Plan of care discussed with patient in detail. Disposition Plan: To be determined   Time spent: 39 minutes   Darliss Cheney, MD [facility] Pager [phone]  If 7PM-7AM, please contact night-coverage [email or web address] Password [email or web address] 06/06/2019, 2:01 PM

## 2019-06-07 LAB — MAGNESIUM: Magnesium: 2.7 mg/dL — ABNORMAL HIGH (ref 1.7–2.4)

## 2019-06-07 LAB — CBC
HCT: 39.5 % (ref 39.0–52.0)
Hemoglobin: 12.2 g/dL — ABNORMAL LOW (ref 13.0–17.0)
MCH: 31.9 pg (ref 26.0–34.0)
MCHC: 30.9 g/dL (ref 30.0–36.0)
MCV: 103.4 fL — ABNORMAL HIGH (ref 80.0–100.0)
Platelets: 98 10*3/uL — ABNORMAL LOW (ref 150–400)
RBC: 3.82 MIL/uL — ABNORMAL LOW (ref 4.22–5.81)
RDW: 18.3 % — ABNORMAL HIGH (ref 11.5–15.5)
WBC: 6.2 10*3/uL (ref 4.0–10.5)
nRBC: 0 % (ref 0.0–0.2)

## 2019-06-07 LAB — BASIC METABOLIC PANEL
Anion gap: 13 (ref 5–15)
BUN: 22 mg/dL (ref 8–23)
CO2: 28 mmol/L (ref 22–32)
Calcium: 8.7 mg/dL — ABNORMAL LOW (ref 8.9–10.3)
Chloride: 93 mmol/L — ABNORMAL LOW (ref 98–111)
Creatinine, Ser: 1.36 mg/dL — ABNORMAL HIGH (ref 0.61–1.24)
GFR calc Af Amer: >60 mL/min (ref >60)
GFR calc non Af Amer: 54 mL/min — ABNORMAL LOW (ref >60)
Glucose, Bld: 165 mg/dL — ABNORMAL HIGH (ref 70–99)
Potassium: 3.3 mmol/L — ABNORMAL LOW (ref 3.5–5.1)
Sodium: 134 mmol/L — ABNORMAL LOW (ref 135–145)

## 2019-06-07 MED ORDER — POTASSIUM CHLORIDE CRYS ER 20 MEQ PO TBCR
40.0000 meq | EXTENDED_RELEASE_TABLET | Freq: Once | ORAL | Status: AC
  Administered 2019-06-07: 40 meq via ORAL
  Filled 2019-06-07: qty 2

## 2019-06-07 MED ORDER — METOLAZONE 5 MG PO TABS
5.0000 mg | ORAL_TABLET | Freq: Once | ORAL | Status: AC
  Administered 2019-06-07: 5 mg via ORAL
  Filled 2019-06-07: qty 1

## 2019-06-07 NOTE — Progress Notes (Signed)
Pt has home CPAP unit in room.

## 2019-06-07 NOTE — Progress Notes (Signed)
PROGRESS NOTE    Albert James  SPQ:[id number] DOB: 1956-07-11 DOA: 05/25/2019 PCP: Mayra Neer, MD   Brief Narrative:   Albert James is an 63 y.o. male with a PMH of chronic diastolic CHF, severe RV dysfunction, severe pulmonary hypertension, COPD, chronic respiratory failure with hypoxia on 2 L of nasal cannula oxygen, atrial fibrillation on Eliquis, cirrhosis, leukopenia, stage III CKD, non-Hodgkin's lymphoma in remission, hypothyroidism, and OSA/OHS on nocturnal CPAP who was admitted 05/25/2019 for evaluation of worsening shortness of breath associated with weight gain despite attempts to manage as an outpatient with adjustment of home diuretic dosing.  He was admitted for acute on chronic diastolic CHF, cards following and he remains on Lasix drip with good results for now.  Consultants:   Cardiology  Procedures:   None  Antimicrobials:   None   Subjective: Patient seen and examined.  No complaints.  Feels about the same like yesterday.  Looks comfortable.  Objective: Vitals:   06/06/19 1900 06/07/19 0000 06/07/19 0453 06/07/19 1121  BP: 114/62 99/68 98/73  (!) 171/154  Pulse: 71 76 80 82  Resp: 15 16 17 15   Temp: 97.6 F (36.4 C) 97.6 F (36.4 C) (!) 97.5 F (36.4 C) 97.8 F (36.6 C)  TempSrc: Oral Oral Axillary Axillary  SpO2: 100% 100% 94% 91%  Weight:   (!) 181.3 kg   Height:        Intake/Output Summary (Last 24 hours) at 06/07/2019 1346 Last data filed at 06/07/2019 0734 Gross per 24 hour  Intake 2110.03 ml  Output 9885 ml  Net -7774.97 ml   Filed Weights   06/05/19 0500 06/06/19 0516 06/07/19 0453  Weight: (!) 186.7 kg (!) 185.7 kg (!) 181.3 kg    Examination: General exam: Appears calm and comfortable, morbidly obese Respiratory system: Crackles at the bases bilaterally, respiratory effort normal. Cardiovascular system: S1 & S2 heard, irregularly irregular rate and rhythm. No JVD, murmurs, rubs, gallops or clicks.  +3 pitting edema  bilateral lower extremity. Gastrointestinal system: Abdomen is nondistended, soft and nontender. No organomegaly or masses felt. Normal bowel sounds heard. Central nervous system: Alert and oriented. No focal neurological deficits. Extremities: Symmetric 5 x 5 power. Skin: No rashes, lesions or ulcers Psychiatry: Judgement and insight appear normal. Mood & affect appropriate.     Data Reviewed: I have personally reviewed following labs and imaging studies  CBC: Recent Labs  Lab 06/03/19 0345 06/04/19 0500 06/05/19 0750 06/06/19 0430 06/07/19 0430  WBC 5.3 5.1 5.0 5.7 6.2  HGB 11.5* 11.7* 11.8* 12.1* 12.2*  HCT 37.4* 36.9* 37.2* 38.5* 39.5  MCV 103.6* 102.8* 102.5* 102.4* 103.4*  PLT 79* 83* 85* 98* 98*   Basic Metabolic Panel: Recent Labs  Lab 06/03/19 0345  06/03/19 1741 06/04/19 0500 06/05/19 0750 06/06/19 0430 06/07/19 0430  NA 134*   < > 133* 135 135 137 134*  K 3.3*   < > 3.6 3.7 3.8 3.5 3.3*  CL 91*   < > 92* 96* 96* 101 93*  CO2 33*   < > 30 29 28 28 28   GLUCOSE 115*   < > 172* 117* 201* 116* 165*  BUN 24*   < > 23 24* 24* 21 22  CREATININE 1.24   < > 1.34* 1.34* 1.35* 1.15 1.36*  CALCIUM 8.5*   < > 8.7* 8.6* 8.6* 8.4* 8.7*  MG 2.1  --   --  2.2 2.4 2.4 2.7*   < > = values in this interval not  displayed.   GFR: Estimated Creatinine Clearance: 90.2 mL/min (A) (by C-G formula based on SCr of 1.36 mg/dL (H)). Liver Function Tests: No results for input(s): AST, ALT, ALKPHOS, BILITOT, PROT, ALBUMIN in the last 168 hours. No results for input(s): LIPASE, AMYLASE in the last 168 hours. No results for input(s): AMMONIA in the last 168 hours. Coagulation Profile: No results for input(s): INR, PROTIME in the last 168 hours. Cardiac Enzymes: No results for input(s): CKTOTAL, CKMB, CKMBINDEX, TROPONINI in the last 168 hours. BNP (last 3 results) No results for input(s): PROBNP in the last 8760 hours. HbA1C: No results for input(s): HGBA1C in the last 72 hours.  CBG: No results for input(s): GLUCAP in the last 168 hours. Lipid Profile: No results for input(s): CHOL, HDL, LDLCALC, TRIG, CHOLHDL, LDLDIRECT in the last 72 hours. Thyroid Function Tests: No results for input(s): TSH, T4TOTAL, FREET4, T3FREE, THYROIDAB in the last 72 hours. Anemia Panel: No results for input(s): VITAMINB12, FOLATE, FERRITIN, TIBC, IRON, RETICCTPCT in the last 72 hours. Sepsis Labs: No results for input(s): PROCALCITON, LATICACIDVEN in the last 168 hours.  No results found for this or any previous visit (from the past 240 hour(s)).    Radiology Studies: No results found.  Scheduled Meds: . acetaZOLAMIDE  250 mg Oral Daily  . apixaban  5 mg Oral BID  . docusate sodium  100 mg Oral BID  . fluticasone  2 spray Each Nare QHS  . Gerhardt's butt cream  1 application Topical QID  . hydrocerin   Topical Daily  . levothyroxine  125 mcg Oral QAC breakfast  . oxyCODONE  40 mg Oral Q12H  . potassium chloride  40 mEq Oral Q6H  . spironolactone  25 mg Oral Daily  . traMADol  50 mg Oral Once   Continuous Infusions: . furosemide (LASIX) infusion 20 mg/hr (06/07/19 0609)     LOS: 13 days   Assessment & Plan:   Principal Problem:   Acute on chronic diastolic (congestive) heart failure (HCC) Active Problems:   Chronic back pain   Chronic atrial fibrillation   Cirrhosis ([place])   Pulmonary hypertension (HCC)   Thrombocytopenia (HCC)   OSA on CPAP   COPD (chronic obstructive pulmonary disease) (HCC)   Chronic respiratory failure with hypoxia (HCC)   Hypokalemia   Hypothyroidism   Acute on chronic hypoxic respiratory failure secondary to acute on chronic diastolic congestive heart failure/severe pulmonary hypertension: Admitted with decompensated heart failure and placed on IV diuretics.  He has gained approximately 60 to 65 pounds in 3 months according to him.  CHF team following.  Repeat echo done on 05/25/2089 showed preserved ejection fraction of 60 to 65% but  increased right ventricular systolic pressure of 96.  VQ scan confirms chronic PE however he is already on Eliquis.  Cardiology managing.  He remains on Lasix drip, Aldactone and Diamox.  Strict I's and O's.  Has lost approximately 8 L more of fluid and currently -[phone].  Chronic atrial fibrillation: Rate controlled despite of nadolol being on hold.  Continue Eliquis.  [place]ash with liver cirrhosis: Stable.  OSA on CPAP: Continue CPAP at night.  COPD: None in exacerbation.  Continue bronchodilator.  Hypothyroidism: Continue Synthroid.  Chronic pain: Continue OxyContin and oxycodone as it is.  CKD stage III: Slight jump in creatinine than yesterday however he is still at his baseline of about 1.3.  Watch daily.  Hypokalemia: 3.3.  Replace orally.  Recheck in the morning.  Chronic thrombocytopenia: Secondary to cirrhosis.  Current platelets stable at 98.  No signs of bleeding.  DVT prophylaxis: Eliquis Code Status: Full code Family Communication: None present at bedside.  Plan of care discussed with patient in detail. Disposition Plan: To be determined   Time spent: 28 minutes   Darliss Cheney, MD [facility] Pager [phone]  If 7PM-7AM, please contact night-coverage [email or web address] Password [email or web address] 06/07/2019, 1:46 PM

## 2019-06-07 NOTE — Progress Notes (Addendum)
Progress Note  Patient Name: Albert James Date of Encounter: 06/07/2019  Primary Cardiologist: [name], MD  Subjective  Admit wt 446-->411->409->399  (he says baseline weight is in 380s)  Continues to diurese with lasix drip + diamox+ metolazone. Brisk diuresis noted. Weight down another 10 pounds CVP 13-14 with prominent v-waves  Denies SOB. But still feels bloated and edematous  Inpatient Medications    Scheduled Meds:  acetaZOLAMIDE  250 mg Oral Daily   apixaban  5 mg Oral BID   docusate sodium  100 mg Oral BID   fluticasone  2 spray Each Nare QHS   Gerhardt's butt cream  1 application Topical QID   hydrocerin   Topical Daily   levothyroxine  125 mcg Oral QAC breakfast   oxyCODONE  40 mg Oral Q12H   potassium chloride  40 mEq Oral Q6H   spironolactone  25 mg Oral Daily   traMADol  50 mg Oral Once   Continuous Infusions:  furosemide (LASIX) infusion 20 mg/hr (06/07/19 0609)   PRN Meds: acetaminophen **OR** acetaminophen, albuterol, lactulose, oxyCODONE, polyvinyl alcohol, sodium chloride flush   Vital Signs    Vitals:   06/06/19 1625 06/06/19 1900 06/07/19 0000 06/07/19 0453  BP: (!) 102/59 114/62 99/68 98/73   Pulse: 60 71 76 80  Resp: 17 15 16 17   Temp: 97.6 F (36.4 C) 97.6 F (36.4 C) 97.6 F (36.4 C) (!) 97.5 F (36.4 C)  TempSrc: Axillary Oral Oral Axillary  SpO2: 96% 100% 100% 94%  Weight:    (!) 181.3 kg  Height:        Intake/Output Summary (Last 24 hours) at 06/07/2019 0713 Last data filed at 06/07/2019 0500 Gross per 24 hour  Intake 2350.03 ml  Output 9200 ml  Net -6849.97 ml   Last 3 Weights 06/07/2019 06/06/2019 06/05/2019  Weight (lbs) 399 lb 11.1 oz 409 lb 6.3 oz 411 lb 9.6 oz  Weight (kg) 181.3 kg 185.7 kg 186.7 kg     Telemetry    A Fib 70-80s Personally reviewed  Physical Exam  CVP 12 in the bed.  General:  In bed.  No resp difficulty HEENT: normal Neck: supple. JVP 11-12 . Carotids 2+ bilat; no bruits.  No lymphadenopathy or thryomegaly appreciated. Cor: PMI nondisplaced. Irregular rate & rhythm. 2/6 TR Lungs: clear Abdomen: morbidly obese, soft, nontender, nondistended. No hepatosplenomegaly. No bruits or masses. Good bowel sounds. Extremities: no cyanosis, clubbing, rash, R and LLE compression wraps.  2+ edema/lymphedema underneath Neuro: alert & orientedx3, cranial nerves grossly intact. moves all 4 extremities w/o difficulty. Affect pleasant   Labs    Chemistry Recent Labs  Lab 06/05/19 0750 06/06/19 0430 06/07/19 0430  NA 135 137 134*  K 3.8 3.5 3.3*  CL 96* 101 93*  CO2 28 28 28   GLUCOSE 201* 116* 165*  BUN 24* 21 22  CREATININE 1.35* 1.15 1.36*  CALCIUM 8.6* 8.4* 8.7*  GFRNONAA 55* >60 54*  GFRAA >60 >60 >60  ANIONGAP 11 8 13      Hematology Recent Labs  Lab 06/05/19 0750 06/06/19 0430 06/07/19 0430  WBC 5.0 5.7 6.2  RBC 3.63* 3.76* 3.82*  HGB 11.8* 12.1* 12.2*  HCT 37.2* 38.5* 39.5  MCV 102.5* 102.4* 103.4*  MCH 32.5 32.2 31.9  MCHC 31.7 31.4 30.9  RDW 18.3* 18.3* 18.3*  PLT 85* 98* 98*    Cardiac EnzymesNo results for input(s): TROPONINI in the last 168 hours. No results for input(s): TROPIPOC in the last 168 hours.  BNP No results for input(s): BNP, PROBNP in the last 168 hours.   DDimer No results for input(s): DDIMER in the last 168 hours.   Radiology    No results found.  Cardiac Studies   2D echo 05/26/19  1. The left ventricle has normal systolic function with an ejection fraction of 60-65%. The cavity size was normal. There is moderately increased left ventricular wall thickness. Left ventricular diastolic Doppler parameters are consistent with impaired  relaxation. There is right ventricular volume and pressure overload.  2. The right ventricle has moderately reduced systolic function. The cavity was moderately enlarged. There is no increase in right ventricular wall thickness. Right ventricular systolic pressure is severely elevated with  an estimated pressure of 96.2  mmHg.  3. Left atrial size was mildly dilated.  4. Right atrial size was severely dilated.  5. The mitral valve is grossly normal. Mild thickening of the mitral valve leaflet.  6. Tricuspid valve regurgitation is severe.  7. The aortic valve is tricuspid. Mild thickening of the aortic valve. Mild calcification of the aortic valve.  8. Pulmonic valve regurgitation is mild to moderate is mild by color flow Doppler.  9. The inferior vena cava was dilated in size with <50% respiratory variability.  Patient Profile     63 y.o. male with a hx of permanent atrial fibrillation (since at least 2015), NASH with cirrhosis, super morbid obesity with suspected OHS/OSA, chronic diastolic CHF/RV failure, severe pulmonary HTN, severe COPD by PFTs 11/2017, chronic respiratory failure, anxiety, depression, CKD III, NHL in remission, varicose veins, chronic pain who was admitted with CHF and marked volume overload. Echo in 09/2017 showed EF 50-55%, septal flattening, severe RV dilation, PASP 77 mmHg. LHC/RHC in 1/19 showed no CAD, + evidence for RV failure and severe pulmonary hypertension. PFTs in 1/19 showed severe COPD. V/Q scan in 1/19 showed no evidence for chronic PEs. Sleep study showed severe OSA, using CPAP. His cirrhosis has been felt due to NASH vs congestive hepatopathy from RV failure, but cannot totally r/o component of portopulmonary HTN (no prior ETOH). He was admitted with at least 60lb fluid overload in the setting of recent AKI/medication adjustments.  His most recent 2D echo 05/26/19 showed EF 60-65%, moderate LVH, + impaired diastolic relaxation, +RV volume and pressure overload, moderately reduced RV function, moderately enlarged RV cavity, RVSP of 25mHg, mildly dilated LA, severely dilated RA, severe tricuspid regurgitation, mild-moderate pulmonic regurg, dilated IVC.   Assessment & Plan    1. Acute on chronic diastolic CHF with prominent RV failure, pulmonary  HTN, and severe TR - Suspect RV failure is triggered by pulmonary hypertension. Underlying OHS/OSA and severe COPD have likely caused pulmonary hypertension/RV failure.  CVP 12..  Continued  lasix drip to 20 mg per hour + diamox  500 mg twice a day and give one dose metolazone today.   Renal function stable.  He had lots of liquids on tray today. Discussed limiting fluid intake.   2. Acute on chronic respiratory failure with intermittent hypoxia - multifactorial likely due to CHF, OHS. COPD. Remains stable today. Will need ambulatory sat eval when euvolemic to ensure sending out on new baseline L/min.  3. Pulmonary HTN with super morbid obesity with OHS/OSA - Severe by echo and RHC (pulmonary arterial hypertension). Suspect group 3 disease due to COPD (severe obstruction on PFTs) and OSA/OHS. Prior V/Q scan not suggestive of chronic PEs. He is on home oxygen and CPAP. Pulmonary vasodilators not felt to be useful in  this situation.   4. CKD stage III -  Renal function stable.   5. Permanent atrial fibrillation - Rate controlled.  Continue Eliquis - although there has been previous question of switching to coumadin given his severe obesity to be able to adequately monitor therapeutic response. Eliquis has been continued as inpatient.   6. Hypokalemia - Supp K    7. Hyponatremia  Watch sodium   8. Deconditioning.  PT recommending HH. Order placed today.     Amy Clegg NP--C  7:13 AM  Patient seen and examined with Darrick Grinder, NP. We discussed all aspects of the encounter. I agree with the assessment and plan as stated above.   Continues to diurese well on high-dose IV lasix, diamox and metolazone. Weight down over 40 pounds but still not back to baseline. CVP 13-14 (personally checked). Creatinine starting to bump slightly but I do think we can diurese at least 1 more day. Will supp K. Discussed need for fluid restriction.   Glori Bickers, MD  9:00 AM

## 2019-06-08 LAB — COMPREHENSIVE METABOLIC PANEL
ALT: 24 U/L (ref 0–44)
AST: 23 U/L (ref 15–41)
Albumin: 3.1 g/dL — ABNORMAL LOW (ref 3.5–5.0)
Alkaline Phosphatase: 100 U/L (ref 38–126)
Anion gap: 14 (ref 5–15)
BUN: 27 mg/dL — ABNORMAL HIGH (ref 8–23)
CO2: 30 mmol/L (ref 22–32)
Calcium: 8.9 mg/dL (ref 8.9–10.3)
Chloride: 89 mmol/L — ABNORMAL LOW (ref 98–111)
Creatinine, Ser: 1.47 mg/dL — ABNORMAL HIGH (ref 0.61–1.24)
GFR calc Af Amer: 57 mL/min — ABNORMAL LOW (ref >60)
GFR calc non Af Amer: 49 mL/min — ABNORMAL LOW (ref >60)
Glucose, Bld: 160 mg/dL — ABNORMAL HIGH (ref 70–99)
Potassium: 3.2 mmol/L — ABNORMAL LOW (ref 3.5–5.1)
Sodium: 133 mmol/L — ABNORMAL LOW (ref 135–145)
Total Bilirubin: 1.2 mg/dL (ref 0.3–1.2)
Total Protein: 5.8 g/dL — ABNORMAL LOW (ref 6.5–8.1)

## 2019-06-08 LAB — BASIC METABOLIC PANEL
Anion gap: 13 (ref 5–15)
BUN: 29 mg/dL — ABNORMAL HIGH (ref 8–23)
CO2: 32 mmol/L (ref 22–32)
Calcium: 9 mg/dL (ref 8.9–10.3)
Chloride: 89 mmol/L — ABNORMAL LOW (ref 98–111)
Creatinine, Ser: 1.77 mg/dL — ABNORMAL HIGH (ref 0.61–1.24)
GFR calc Af Amer: 46 mL/min — ABNORMAL LOW (ref >60)
GFR calc non Af Amer: 39 mL/min — ABNORMAL LOW (ref >60)
Glucose, Bld: 176 mg/dL — ABNORMAL HIGH (ref 70–99)
Potassium: 3.6 mmol/L (ref 3.5–5.1)
Sodium: 134 mmol/L — ABNORMAL LOW (ref 135–145)

## 2019-06-08 LAB — MAGNESIUM: Magnesium: 2.6 mg/dL — ABNORMAL HIGH (ref 1.7–2.4)

## 2019-06-08 MED ORDER — TORSEMIDE 20 MG PO TABS
100.0000 mg | ORAL_TABLET | Freq: Two times a day (BID) | ORAL | Status: DC
  Administered 2019-06-08 – 2019-06-09 (×3): 100 mg via ORAL
  Filled 2019-06-08 (×3): qty 5

## 2019-06-08 MED ORDER — METOLAZONE 2.5 MG PO TABS: 2.5000 mg | ORAL_TABLET | ORAL | Status: DC

## 2019-06-08 MED ORDER — POTASSIUM CHLORIDE CRYS ER 20 MEQ PO TBCR
40.0000 meq | EXTENDED_RELEASE_TABLET | Freq: Every day | ORAL | Status: DC
  Administered 2019-06-09: 40 meq via ORAL
  Filled 2019-06-08: qty 2

## 2019-06-08 MED ORDER — POTASSIUM CHLORIDE CRYS ER 20 MEQ PO TBCR
40.0000 meq | EXTENDED_RELEASE_TABLET | Freq: Four times a day (QID) | ORAL | Status: AC
  Administered 2019-06-08 (×2): 40 meq via ORAL
  Filled 2019-06-08 (×2): qty 2

## 2019-06-08 NOTE — Progress Notes (Signed)
Physical Therapy Treatment Patient Details Name: Albert James MRN: [id number] DOB: 01-12-1956 Today's Date: 06/08/2019    History of Present Illness Albert James is an 63 y.o. male with a PMH of chronic diastolic CHF, severe RV dysfunction, severe pulmonary hypertension, COPD, chronic respiratory failure with hypoxia on 2 L of nasal cannula oxygen, atrial fibrillation on Eliquis, cirrhosis, leukopenia, stage III CKD, non-Hodgkin's lymphoma in remission, hypothyroidism, and OSA/OHS on nocturnal CPAP who was admitted 05/25/2019 for evaluation of worsening shortness of breath associated with weight gain despite attempts to manage as an outpatient with adjustment of home diuretic dosing.    PT Comments    Pt admitted with above diagnosis. Pt currently with functional limitations due to balance and endurance deficits. Pt was able to ambulate with RW with good stability overall.  No assist given just assist for lines.  Did cue for pursed lip breathing and pt does have incr O2 needs with ambulation needing up to 6L to maintain sats above 90% with activity with 3 min to recover on return to room.  Pt HR incr from 87 bpm to 138 bpm with activity.  Will continue to progress pt as able.   Pt will benefit from skilled PT to increase their independence and safety with mobility to allow discharge to the venue listed below.     Follow Up Recommendations  Home health PT;Supervision/Assistance - 24 hour     Equipment Recommendations  Other (comment)(Pt has been trying to get motorized wheelchair)    Recommendations for Other Services       Precautions / Restrictions Precautions Precautions: Fall Restrictions Weight Bearing Restrictions: No    Mobility  Bed Mobility Overal bed mobility: Independent             General bed mobility comments: incr time but can transition without assist.   Transfers Overall transfer level: Needs assistance Equipment used: Rolling walker (2  wheeled) Transfers: Sit to/from Stand Sit to Stand: Supervision            Ambulation/Gait Ambulation/Gait assistance: Min guard;Supervision Gait Distance (Feet): 125 Feet Assistive device: Rolling walker (2 wheeled) Gait Pattern/deviations: Decreased stride length;Shuffle;Step-to pattern;Decreased step length - right;Decreased step length - left;Antalgic;Drifts right/left;Wide base of support;Trunk flexed   Gait velocity interpretation: <1.31 ft/sec, indicative of household ambulator General Gait Details: Pt was able to ambulate with RW with no LOB and mostly supervision.  Min guard due to lines.  STeady with RW. Pt was on 4LO2 on arrival with sats down to 88% at rest.  Had to incr O2 to 6LO2 with ambulation to keep sats 91% with pt DOE 3/4 by end of walk. Desat to 84% once in chair and took 3 minutes for O2 to return to 88% at rest on 4L.  Hr 87-138 bpm toward end of walk.     Stairs             Wheelchair Mobility    Modified Rankin (Stroke Patients Only)       Balance Overall balance assessment: Needs assistance Sitting-balance support: No upper extremity supported;Feet supported Sitting balance-Leahy Scale: Fair     Standing balance support: Bilateral upper extremity supported;During functional activity Standing balance-Leahy Scale: Poor Standing balance comment: relies on UE support                            Cognition Arousal/Alertness: Awake/alert Behavior During Therapy: WFL for tasks assessed/performed Overall Cognitive Status: Within Functional Limits  for tasks assessed                                        Exercises General Exercises - Lower Extremity Ankle Circles/Pumps: AROM;Both;10 reps;Supine Long Arc Quad: AROM;Both;Seated;15 reps    General Comments        Pertinent Vitals/Pain Pain Assessment: No/denies pain    Home Living                      Prior Function            PT Goals (current  goals can now be found in the care plan section) Acute Rehab PT Goals Patient Stated Goal: to go home Progress towards PT goals: Progressing toward goals    Frequency    Min 3X/week      PT Plan Current plan remains appropriate    Co-evaluation              AM-PAC PT "6 Clicks" Mobility   Outcome Measure  Help needed turning from your back to your side while in a flat bed without using bedrails?: None Help needed moving from lying on your back to sitting on the side of a flat bed without using bedrails?: None Help needed moving to and from a bed to a chair (including a wheelchair)?: None Help needed standing up from a chair using your arms (e.g., wheelchair or bedside chair)?: None Help needed to walk in hospital room?: A Little Help needed climbing 3-5 steps with a railing? : A Little 6 Click Score: 22    End of Session Equipment Utilized During Treatment: Gait belt;Oxygen Activity Tolerance: Patient limited by fatigue Patient left: in chair;with call bell/phone within reach Nurse Communication: Mobility status PT Visit Diagnosis: Unsteadiness on feet (R26.81);Muscle weakness (generalized) (M62.81)     Time: 1607-3710 PT Time Calculation (min) (ACUTE ONLY): 16 min  Charges:  $Gait Training: 8-22 mins                     [place] Pager:  [phone]  Office:  [place] 06/08/2019, 11:32 AM

## 2019-06-08 NOTE — Progress Notes (Addendum)
Progress Note  Patient Name: Albert James Date of Encounter: 06/08/2019  Primary Cardiologist: [name], MD  Subjective  Admit wt 446-->411->409->399->390  (he says baseline weight is in 380s)  Continues to diurese with lasix drip + diamox+ metolazone. Weight down another 9 pounds. Creatinine continues to climb  Denies SOB. Orthopnea or PND  Inpatient Medications    Scheduled Meds: . acetaZOLAMIDE  250 mg Oral Daily  . apixaban  5 mg Oral BID  . docusate sodium  100 mg Oral BID  . fluticasone  2 spray Each Nare QHS  . Gerhardt's butt cream  1 application Topical QID  . hydrocerin   Topical Daily  . levothyroxine  125 mcg Oral QAC breakfast  . oxyCODONE  40 mg Oral Q12H  . potassium chloride  40 mEq Oral Q6H  . spironolactone  25 mg Oral Daily  . traMADol  50 mg Oral Once   Continuous Infusions: . furosemide (LASIX) infusion 20 mg/hr (06/08/19 0300)   PRN Meds: acetaminophen **OR** acetaminophen, albuterol, lactulose, oxyCODONE, polyvinyl alcohol, sodium chloride flush   Vital Signs    Vitals:   06/07/19 2350 06/08/19 0010 06/08/19 0300 06/08/19 0301  BP: 96/69   104/73  Pulse:  90 70 63  Resp:  16 20 11   Temp: 98.3 F (36.8 C)   98.3 F (36.8 C)  TempSrc: Axillary   Axillary  SpO2:  (!) 82% (!) 85% 94%  Weight:  (!) 177.2 kg    Height:        Intake/Output Summary (Last 24 hours) at 06/08/2019 0728 Last data filed at 06/08/2019 0303 Gross per 24 hour  Intake 1419.69 ml  Output 9985 ml  Net -8565.31 ml   Last 3 Weights 06/08/2019 06/07/2019 06/06/2019  Weight (lbs) 390 lb 10.5 oz 399 lb 11.1 oz 409 lb 6.3 oz  Weight (kg) 177.2 kg 181.3 kg 185.7 kg     Telemetry   A fib 70-80s   Physical Exam  CVP 8  General:  In bed. No resp difficulty HEENT: normal Neck: supple. JVP 8-9 . Carotids 2+ bilat; no bruits. No lymphadenopathy or thryomegaly appreciated. Cor: PMI nondisplaced. Irregular  rate & rhythm. No rubs, gallops or murmurs. Lungs:  clear Abdomen: soft, nontender, nondistended. No hepatosplenomegaly. No bruits or masses. Good bowel sounds. Extremities: no cyanosis, clubbing, rash, R and LLE wraps.  Neuro: alert & orientedx3, cranial nerves grossly intact. moves all 4 extremities w/o difficulty. Affect pleasant   Labs    Chemistry Recent Labs  Lab 06/06/19 0430 06/07/19 0430 06/08/19 0429  NA 137 134* 133*  K 3.5 3.3* 3.2*  CL 101 93* 89*  CO2 28 28 30   GLUCOSE 116* 165* 160*  BUN 21 22 27*  CREATININE 1.15 1.36* 1.47*  CALCIUM 8.4* 8.7* 8.9  PROT  --   --  5.8*  ALBUMIN  --   --  3.1*  AST  --   --  23  ALT  --   --  24  ALKPHOS  --   --  100  BILITOT  --   --  1.2  GFRNONAA >60 54* 49*  GFRAA >60 >60 57*  ANIONGAP 8 13 14      Hematology Recent Labs  Lab 06/05/19 0750 06/06/19 0430 06/07/19 0430  WBC 5.0 5.7 6.2  RBC 3.63* 3.76* 3.82*  HGB 11.8* 12.1* 12.2*  HCT 37.2* 38.5* 39.5  MCV 102.5* 102.4* 103.4*  MCH 32.5 32.2 31.9  MCHC 31.7 31.4 30.9  RDW 18.3* 18.3* 18.3*  PLT 85* 98* 98*    Cardiac EnzymesNo results for input(s): TROPONINI in the last 168 hours. No results for input(s): TROPIPOC in the last 168 hours.   BNP No results for input(s): BNP, PROBNP in the last 168 hours.   DDimer No results for input(s): DDIMER in the last 168 hours.   Radiology    No results found.  Cardiac Studies   2D echo 05/26/19  1. The left ventricle has normal systolic function with an ejection fraction of 60-65%. The cavity size was normal. There is moderately increased left ventricular wall thickness. Left ventricular diastolic Doppler parameters are consistent with impaired  relaxation. There is right ventricular volume and pressure overload.  2. The right ventricle has moderately reduced systolic function. The cavity was moderately enlarged. There is no increase in right ventricular wall thickness. Right ventricular systolic pressure is severely elevated with an estimated pressure of 96.2   mmHg.  3. Left atrial size was mildly dilated.  4. Right atrial size was severely dilated.  5. The mitral valve is grossly normal. Mild thickening of the mitral valve leaflet.  6. Tricuspid valve regurgitation is severe.  7. The aortic valve is tricuspid. Mild thickening of the aortic valve. Mild calcification of the aortic valve.  8. Pulmonic valve regurgitation is mild to moderate is mild by color flow Doppler.  9. The inferior vena cava was dilated in size with <50% respiratory variability.  Patient Profile     63 y.o. male with a hx of permanent atrial fibrillation (since at least 2015), NASH with cirrhosis, super morbid obesity with suspected OHS/OSA, chronic diastolic CHF/RV failure, severe pulmonary HTN, severe COPD by PFTs 11/2017, chronic respiratory failure, anxiety, depression, CKD III, NHL in remission, varicose veins, chronic pain who was admitted with CHF and marked volume overload. Echo in 09/2017 showed EF 50-55%, septal flattening, severe RV dilation, PASP 77 mmHg. LHC/RHC in 1/19 showed no CAD, + evidence for RV failure and severe pulmonary hypertension. PFTs in 1/19 showed severe COPD. V/Q scan in 1/19 showed no evidence for chronic PEs. Sleep study showed severe OSA, using CPAP. His cirrhosis has been felt due to NASH vs congestive hepatopathy from RV failure, but cannot totally r/o component of portopulmonary HTN (no prior ETOH). He was admitted with at least 60lb fluid overload in the setting of recent AKI/medication adjustments.  His most recent 2D echo 05/26/19 showed EF 60-65%, moderate LVH, + impaired diastolic relaxation, +RV volume and pressure overload, moderately reduced RV function, moderately enlarged RV cavity, RVSP of 33mHg, mildly dilated LA, severely dilated RA, severe tricuspid regurgitation, mild-moderate pulmonic regurg, dilated IVC.   Assessment & Plan    1. Acute on chronic diastolic CHF with prominent RV failure, pulmonary HTN, and severe TR - Suspect RV  failure is triggered by pulmonary hypertension. Underlying OHS/OSA and severe COPD have likely caused pulmonary hypertension/RV failure.  Volume status much improved. Creatinine trending up.  Stop lasix drip, diamox. Start torsemide 100 mg twice a day + metolazone 2.5 mg Monday and Friday.   2. Acute on chronic respiratory failure with intermittent hypoxia - multifactorial likely due to CHF, OHS. COPD. Remains stable today. Will need ambulatory sat eval when euvolemic to ensure sending out on new baseline L/min.  3. Pulmonary HTN with super morbid obesity with OHS/OSA - Severe by echo and RHC (pulmonary arterial hypertension). Suspect group 3 disease due to COPD (severe obstruction on PFTs) and OSA/OHS. Prior V/Q scan not suggestive of  chronic PEs. He is on home oxygen and CPAP. Pulmonary vasodilators not felt to be useful in this situation.   4. CKD stage III -  Renal function trending up.   5. Permanent atrial fibrillation - Rate controlled. Continue Eliquis - although there has been previous question of switching to coumadin given his severe obesity to be able to adequately monitor therapeutic response. Eliquis has been continued as inpatient.   6. Hypokalemia - Supp K    7. Hyponatremia  Sodium trending down.   8. Deconditioning.  PT recommending HH. Order placed today.    D/C meds  apixaban 5 mg twice a day  Torsemide 100 mg twice a day Metolazone 2.5 mg twice a week Monday and Friday.  K 40 meq daily  Spironolactone 25 mg daily.   HF follow up has been set up. He will need BMET next week with home health.    Amy Clegg NP--C  7:28 AM  Patient seen and examined with Darrick Grinder, NP. We discussed all aspects of the encounter. I agree with the assessment and plan as stated above.   Volume status is about as good as we are going to get it. CVP 8. Creatinine climbing. Will stop IV lasix. Switch back to orals. Ok to go home from our standpoint on above regimen. HF f/u arranged.    Glori Bickers, MD  9:55 AM

## 2019-06-08 NOTE — Progress Notes (Signed)
PROGRESS NOTE    [name]  JJH:[id number] DOB: 02/22/1956 DOA: 05/25/2019 PCP: Mayra Neer, MD   Brief Narrative:   Albert James is an 63 y.o. male with a PMH of chronic diastolic CHF, severe RV dysfunction, severe pulmonary hypertension, COPD, chronic respiratory failure with hypoxia on 2 L of nasal cannula oxygen, atrial fibrillation on Eliquis, cirrhosis, leukopenia, stage III CKD, non-Hodgkin's lymphoma in remission, hypothyroidism, and OSA/OHS on nocturnal CPAP who was admitted 05/25/2019 for evaluation of worsening shortness of breath associated with weight gain despite attempts to manage as an outpatient with adjustment of home diuretic dosing.  He was admitted for acute on chronic diastolic CHF, cardiology was consulted and he was started on Lasix drip as well as other oral diuretics.  Over the course of his hospitalization he was -55 178 mL and is down to 390 pounds of weight all the way from 446 pounds at the time of admission.  Consultants:   Cardiology  Procedures:   None  Antimicrobials:   None   Subjective: Patient seen and examined.  He has no complaints.  He is feeling much better.  No new complaint.  Objective: Vitals:   06/08/19 0010 06/08/19 0300 06/08/19 0301 06/08/19 0827  BP:   104/73 111/65  Pulse: 90 70 63   Resp: 16 20 11    Temp:   98.3 F (36.8 C) 98.5 F (36.9 C)  TempSrc:   Axillary Oral  SpO2: (!) 82% (!) 85% 94% (!) 89%  Weight: (!) 177.2 kg     Height:        Intake/Output Summary (Last 24 hours) at 06/08/2019 1334 Last data filed at 06/08/2019 8185 Gross per 24 hour  Intake 879.69 ml  Output 10300 ml  Net -9420.31 ml   Filed Weights   06/06/19 0516 06/07/19 0453 06/08/19 0010  Weight: (!) 185.7 kg (!) 181.3 kg (!) 177.2 kg    Examination:  General exam: Appears calm and comfortable, morbidly obese Respiratory system: Very few fine crackles at the bases bilaterally. Respiratory effort normal. Cardiovascular  system: S1 & S2 heard, RRR. No JVD, murmurs, rubs, gallops or clicks.  +3 pitting edema bilateral lower extremity Gastrointestinal system: Abdomen is nondistended, soft and nontender. No organomegaly or masses felt. Normal bowel sounds heard. Central nervous system: Alert and oriented. No focal neurological deficits. Extremities: Symmetric 5 x 5 power. Skin: No rashes, lesions or ulcers Psychiatry: Judgement and insight appear normal. Mood & affect appropriate.    Data Reviewed: I have personally reviewed following labs and imaging studies  CBC: Recent Labs  Lab 06/03/19 0345 06/04/19 0500 06/05/19 0750 06/06/19 0430 06/07/19 0430  WBC 5.3 5.1 5.0 5.7 6.2  HGB 11.5* 11.7* 11.8* 12.1* 12.2*  HCT 37.4* 36.9* 37.2* 38.5* 39.5  MCV 103.6* 102.8* 102.5* 102.4* 103.4*  PLT 79* 83* 85* 98* 98*   Basic Metabolic Panel: Recent Labs  Lab 06/04/19 0500 06/05/19 0750 06/06/19 0430 06/07/19 0430 06/08/19 0429  NA 135 135 137 134* 133*  K 3.7 3.8 3.5 3.3* 3.2*  CL 96* 96* 101 93* 89*  CO2 29 28 28 28 30   GLUCOSE 117* 201* 116* 165* 160*  BUN 24* 24* 21 22 27*  CREATININE 1.34* 1.35* 1.15 1.36* 1.47*  CALCIUM 8.6* 8.6* 8.4* 8.7* 8.9  MG 2.2 2.4 2.4 2.7* 2.6*   GFR: Estimated Creatinine Clearance: 82.3 mL/min (A) (by C-G formula based on SCr of 1.47 mg/dL (H)). Liver Function Tests: Recent Labs  Lab 06/08/19 [phone]  AST 23  ALT 24  ALKPHOS 100  BILITOT 1.2  PROT 5.8*  ALBUMIN 3.1*   No results for input(s): LIPASE, AMYLASE in the last 168 hours. No results for input(s): AMMONIA in the last 168 hours. Coagulation Profile: No results for input(s): INR, PROTIME in the last 168 hours. Cardiac Enzymes: No results for input(s): CKTOTAL, CKMB, CKMBINDEX, TROPONINI in the last 168 hours. BNP (last 3 results) No results for input(s): PROBNP in the last 8760 hours. HbA1C: No results for input(s): HGBA1C in the last 72 hours. CBG: No results for input(s): GLUCAP in the last 168  hours. Lipid Profile: No results for input(s): CHOL, HDL, LDLCALC, TRIG, CHOLHDL, LDLDIRECT in the last 72 hours. Thyroid Function Tests: No results for input(s): TSH, T4TOTAL, FREET4, T3FREE, THYROIDAB in the last 72 hours. Anemia Panel: No results for input(s): VITAMINB12, FOLATE, FERRITIN, TIBC, IRON, RETICCTPCT in the last 72 hours. Sepsis Labs: No results for input(s): PROCALCITON, LATICACIDVEN in the last 168 hours.  No results found for this or any previous visit (from the past 240 hour(s)).    Radiology Studies: No results found.  Scheduled Meds: . apixaban  5 mg Oral BID  . docusate sodium  100 mg Oral BID  . fluticasone  2 spray Each Nare QHS  . Gerhardt's butt cream  1 application Topical QID  . hydrocerin   Topical Daily  . levothyroxine  125 mcg Oral QAC breakfast  . [START ON 06/11/2019] metolazone  2.5 mg Oral Once per day on Mon Fri  . oxyCODONE  40 mg Oral Q12H  . potassium chloride  40 mEq Oral Q6H  . [START ON 06/09/2019] potassium chloride  40 mEq Oral Daily  . spironolactone  25 mg Oral Daily  . torsemide  100 mg Oral BID  . traMADol  50 mg Oral Once   Continuous Infusions:    LOS: 14 days   Assessment & Plan:   Principal Problem:   Acute on chronic diastolic (congestive) heart failure (HCC) Active Problems:   Chronic back pain   Chronic atrial fibrillation   Cirrhosis ([place])   Pulmonary hypertension (HCC)   Thrombocytopenia (HCC)   OSA on CPAP   COPD (chronic obstructive pulmonary disease) (HCC)   Chronic respiratory failure with hypoxia (HCC)   Hypokalemia   Hypothyroidism   Acute on chronic hypoxic respiratory failure secondary to acute on chronic diastolic congestive heart failure/severe pulmonary hypertension: Admitted with decompensated heart failure and placed on IV diuretics.  He has gained approximately 60 to 65 pounds in 3 months according to him.  CHF team following.  Repeat echo done on 05/25/2089 showed preserved ejection fraction of  60 to 65% but increased right ventricular systolic pressure of 96.  VQ scan confirms chronic PE however he is already on Eliquis.  Cardiology managing.  Lasix drip has been stopped today.  He is on metolazone, Aldactone and torsemide. Strict I's and O's.  Here by cardiology however I am going to keep him overnight for observation with potential discharge tomorrow.  Chronic atrial fibrillation: Rate controlled despite of nadolol being on hold.  Continue Eliquis.  [place] with liver cirrhosis: Stable.  OSA on CPAP: Continue CPAP at night.  COPD: None in exacerbation.  Continue bronchodilator.  Hypothyroidism: Continue Synthroid.  Chronic pain: Continue OxyContin and oxycodone as it is.  CKD stage III: Slight jump in creatinine today but that is not significant.  Hypokalemia: 3.  2 again today.  Will replace orally.  Recheck in the morning.  Chronic thrombocytopenia: Secondary to cirrhosis.  Current platelets stable at 98.  No signs of bleeding.  DVT prophylaxis: Eliquis Code Status: Full code Family Communication: Called and discussed with his daughter Joycelyn Schmid indicated.  She had no questions.  I informed her about patient's potential discharge tomorrow.  She is agreeable. Disposition Plan: Discharge home with home health tomorrow.   Time spent: 30 minutes   Darliss Cheney, MD [facility] Pager [phone]  If 7PM-7AM, please contact night-coverage [email or web address] Password [email or web address] 06/08/2019, 1:34 PM

## 2019-06-09 LAB — COMPREHENSIVE METABOLIC PANEL
ALT: 19 U/L (ref 0–44)
AST: 20 U/L (ref 15–41)
Albumin: 2.6 g/dL — ABNORMAL LOW (ref 3.5–5.0)
Alkaline Phosphatase: 90 U/L (ref 38–126)
Anion gap: 12 (ref 5–15)
BUN: 29 mg/dL — ABNORMAL HIGH (ref 8–23)
CO2: 30 mmol/L (ref 22–32)
Calcium: 8.2 mg/dL — ABNORMAL LOW (ref 8.9–10.3)
Chloride: 91 mmol/L — ABNORMAL LOW (ref 98–111)
Creatinine, Ser: 1.33 mg/dL — ABNORMAL HIGH (ref 0.61–1.24)
GFR calc Af Amer: >60 mL/min (ref >60)
GFR calc non Af Amer: 56 mL/min — ABNORMAL LOW (ref >60)
Glucose, Bld: 150 mg/dL — ABNORMAL HIGH (ref 70–99)
Potassium: 2.7 mmol/L — CL (ref 3.5–5.1)
Sodium: 133 mmol/L — ABNORMAL LOW (ref 135–145)
Total Bilirubin: 1.2 mg/dL (ref 0.3–1.2)
Total Protein: 4.9 g/dL — ABNORMAL LOW (ref 6.5–8.1)

## 2019-06-09 MED ORDER — POTASSIUM CHLORIDE CRYS ER 20 MEQ PO TBCR: 40.0000 meq | EXTENDED_RELEASE_TABLET | Freq: Every day | ORAL | 0 refills | Status: DC

## 2019-06-09 MED ORDER — POTASSIUM CHLORIDE CRYS ER 20 MEQ PO TBCR
40.0000 meq | EXTENDED_RELEASE_TABLET | Freq: Once | ORAL | Status: AC
  Administered 2019-06-09: 40 meq via ORAL

## 2019-06-09 MED ORDER — POTASSIUM CHLORIDE CRYS ER 20 MEQ PO TBCR
40.0000 meq | EXTENDED_RELEASE_TABLET | ORAL | Status: DC
  Administered 2019-06-09: 40 meq via ORAL
  Filled 2019-06-09 (×2): qty 2

## 2019-06-09 MED ORDER — METOLAZONE 2.5 MG PO TABS: 2.5000 mg | ORAL_TABLET | ORAL | 0 refills | Status: DC

## 2019-06-09 MED ORDER — SPIRONOLACTONE 25 MG PO TABS: 25.0000 mg | ORAL_TABLET | Freq: Every day | ORAL | 0 refills | Status: AC

## 2019-06-09 NOTE — Care Management (Signed)
Patient is discharging home today with [facility].  Patient has O2 through [employer] and will have a portable concentrator in the vehicle that picks him up from hospital.  [place] with [employer] is aware of new order to increase O2 from 2 to to 3lpm.

## 2019-06-09 NOTE — Discharge Summary (Signed)
Physician Discharge Summary  Albert James JGG:[id number] DOB: [birth year] DOA: 05/25/2019  PCP: Mayra Neer, MD  Admit date: 05/25/2019 Discharge date: 06/09/2019  Admitted From: Home Disposition: Home  Recommendations for Outpatient Follow-up:  1. Follow up with PCP in 1-2 weeks 2. Follow with cardiology in 1 to 2 weeks 3. Please obtain BMP/CBC in one week 4. Please follow up on the following pending results:  Home Health: Yes Equipment/Devices:  Discharge Condition: Stable CODE STATUS: Full code Diet recommendation: Low-sodium diet  Subjective: Patient seen and examined.  Feels well.  No shortness of breath.  Wants to go home.  Brief/Interim Summary: Albert James an 63 y.o.malewith a PMH of chronic diastolic CHF, severe RV dysfunction, severe pulmonary hypertension, COPD, chronic respiratory failure with hypoxia on 2 L of nasal cannula oxygen, atrial fibrillation on Eliquis, cirrhosis, leukopenia, stage III CKD, non-Hodgkin's lymphoma in remission, hypothyroidism, and OSA/OHS on nocturnal CPAP who was admitted 05/25/2019 for evaluation of worsening shortness of breath associated with weight gain despite attempts to manage as an outpatient with adjustment of home diuretic dosing.He was admitted for acute on chronic diastolic CHF, cardiology was consulted and he was started on Lasix drip as well as other oral diuretics.  Over the course of his hospitalization he was - 59555 mL and is down to 383 pounds of weight all the way from 65 pounds at the time of admission.  His Lasix drip was stopped on 06/08/2019 and he was switched to oral torsemide, Aldactone and metolazone and he was cleared by cardiology to be discharged however his creatinine jumped a little bit so he was kept overnight.  Today his creatinine is back to his baseline around 1.3.  He has hypokalemia of 2.7 he is going to get 3 doses of potassium chloride 40 mEq today and then he will be discharged back to home.   I called his daughter yesterday and informed her about his potential discharge today and she was agreeable with the plan.  Patient is going to call the daughter today.  He has no further questions.  He will follow with PCP and cardiology within 1 week.  Discharge Diagnoses:  Principal Problem:   Acute on chronic diastolic (congestive) heart failure (HCC) Active Problems:   Chronic back pain   Chronic atrial fibrillation   Cirrhosis ([place])   Pulmonary hypertension (HCC)   Thrombocytopenia (HCC)   OSA on CPAP   COPD (chronic obstructive pulmonary disease) (HCC)   Chronic respiratory failure with hypoxia (HCC)   Hypokalemia   Hypothyroidism    Discharge Instructions  Discharge Instructions    Discharge patient   Complete by: As directed    Please discharge patient after giving him 4 PM dose of potassium chloride.   Discharge disposition: 01-Home or Self Care   Discharge patient date: 06/09/2019     Allergies as of 06/09/2019      Reactions   Vancomycin Other (See Comments)   "red man" syndrome (when in large quantity)      Medication List    TAKE these medications   cycloSPORINE 0.05 % ophthalmic emulsion Commonly known as: RESTASIS Place 1 drop into both eyes 2 (two) times daily as needed (for eye irritation/dryness).   diclofenac sodium 1 % Gel Commonly known as: VOLTAREN Apply 1 g topically 4 (four) times daily as needed (for knee pain.).   Eliquis 5 MG Tabs tablet Generic drug: apixaban TAKE 1 TABLET(5 MG) BY MOUTH TWICE DAILY What changed: See the new instructions.  fluticasone 50 MCG/ACT nasal spray Commonly known as: FLONASE Place 2 sprays into both nostrils at bedtime.   lactulose 10 GM/15ML solution Commonly known as: CHRONULAC Take 10 g by mouth daily.   levothyroxine 125 MCG tablet Commonly known as: SYNTHROID Take 125 mcg by mouth daily before breakfast.   metolazone 2.5 MG tablet Commonly known as: ZAROXOLYN Take 1 tablet (2.5 mg total) by  mouth 2 (two) times a week for 30 doses. Start taking on: June 11, 2019   nadolol 20 MG tablet Commonly known as: CORGARD Take 20 mg by mouth daily.   Narcan 4 MG/0.1ML Liqd nasal spray kit Generic drug: naloxone Place 1 spray into the nose as needed (For accidental opoid overdose).   omega-3 acid ethyl esters 1 g capsule Commonly known as: LOVAZA Take 2 g by mouth 2 (two) times daily.   Oxycodone HCl 20 MG Tabs Take 1 tablet (20 mg total) by mouth every 8 (eight) hours as needed.   oxyCODONE 40 mg 12 hr tablet Commonly known as: OXYCONTIN Take 1 tablet (40 mg total) by mouth every 12 (twelve) hours.   potassium chloride SA 20 MEQ tablet Commonly known as: K-DUR Take 2 tablets (40 mEq total) by mouth daily. Start taking on: June 10, 2019   spironolactone 25 MG tablet Commonly known as: ALDACTONE Take 1 tablet (25 mg total) by mouth daily. Start taking on: June 10, 2019   torsemide 20 MG tablet Commonly known as: DEMADEX Take 5 tablets (100 mg total) by mouth 2 (two) times daily.            Durable Medical Equipment  (From admission, onward)         Start     Ordered   06/08/19 1131  For home use only DME oxygen  Once    Question Answer Comment  Length of Need Lifetime   Mode or (Route) Nasal cannula   Liters per Minute 3   Frequency Continuous (stationary and portable oxygen unit needed)   Oxygen delivery system Gas      06/08/19 1130   06/06/19 0727  Heart failure home health orders  (Heart failure home health orders / Face to face)  Once    Comments: Heart Failure Follow-up Care:  Verify follow-up appointments per Patient Discharge Instructions. Confirm transportation arranged. Reconcile home medications with discharge medication list. Remove discontinued medications from use. Assist patient/caregiver to manage medications using pill box. Reinforce low sodium food selection Assessments: Vital signs and oxygen saturation at each visit. Assess home  environment for safety concerns, caregiver support and availability of low-sodium foods. Consult Education officer, museum, PT/OT, Dietitian, and CNA based on assessments. Perform comprehensive cardiopulmonary assessment. Notify MD for any change in condition or weight gain of 3 pounds in one day or 5 pounds in one week with symptoms. Daily Weights and Symptom Monitoring: Ensure patient has access to scales. Teach patient/caregiver to weigh daily before breakfast and after voiding using same scale and record.    Teach patient/caregiver to track weight and symptoms and when to notify Provider. Activity: Develop individualized activity plan with patient/caregiver.  HHRN 2 wk 2 for CP assessment    PT at SOC:PT 2 wk 2 for CP Rehab.  Question Answer Comment  Heart Failure Follow-up Care [facility] at [phone]   Obtain the following labs Basic Metabolic Panel   Obtain the following labs Other see comments   Lab frequency Other see comments   Fax lab results to A[facility]  Clinic at [phone]   Diet Low Sodium Heart Healthy   Fluid restrictions: 1800 mL Fluid      06/06/19 0728         Follow-up Information    [facility]. Go on 06/26/2019.   Specialty: Cardiology Why: 10:00 AM, parking code [phone] information: [address] [phone] [place] [phone]         Allergies  Allergen Reactions  . Vancomycin Other (See Comments)    "red man" syndrome (when in large quantity)    Consultations: Cardiology   Procedures/Studies: Dg Chest 2 View  Result Date: 05/25/2019 CLINICAL DATA:  Shortness of breath EXAM: CHEST - 2 VIEW COMPARISON:  12/23/2018 FINDINGS: Enlargement of cardiac silhouette with pulmonary vascular congestion. Mediastinal contours unchanged. Minimal interstitial edema. No pleural effusion or pneumothorax. Bones demineralized. IMPRESSION: Enlargement of cardiac  silhouette with pulmonary vascular congestion and minimal pulmonary edema. Electronically Signed   By: Lavonia Dana M.D.   On: 05/25/2019 17:11   US Abdomen Complete  Result Date: 05/15/2019 CLINICAL DATA:  Cirrhosis. EXAM: ABDOMEN ULTRASOUND COMPLETE COMPARISON:  October 02, 2018 FINDINGS: Gallbladder: The wall of the gallbladder is thickened to 6 mm and edematous in appearance. There is a small amount of pericholecystic fluid. These findings are unchanged since November of 2019. Common bile duct: Diameter: 4 mm Liver: Heterogeneous echotexture with no focal mass. Portal vein is patent on color Doppler imaging with normal direction of blood flow towards the liver. IVC: No abnormality visualized. Pancreas: Visualized portion unremarkable. Spleen: Size and appearance within normal limits. Right Kidney: Length: 13.3 cm. Two cysts with the largest measuring 5 cm. Left Kidney: Length: 13.3 cm. There is a midpole cyst measuring 1.4 cm. Abdominal aorta: Not visualized due to poor penetration. Other findings: None. IMPRESSION: 1. The liver echotexture is heterogeneous consistent with the history of cirrhosis. No focal mass. 2. Thickened edematous gallbladder wall, similar in appearance since November 2019. This may be due to patient's underlying cirrhosis given the lack of change and the lack of other gallbladder findings. There is also a small amount of pericholecystic fluid. Recommend clinical correlation. 3. Renal cysts. Electronically Signed   By: Dorise Bullion III M.D   On: 05/15/2019 15:17   Dg Chest Port 1 View  Result Date: 05/29/2019 CLINICAL DATA:  Status post PICC line insertion EXAM: PORTABLE CHEST 1 VIEW COMPARISON:  05/25/2019 FINDINGS: Cardiac shadow remains enlarged. Right-sided PICC line is noted with the catheter tip distal superior vena cava. Mild central vascular congestion is noted. No focal infiltrate is seen. No bony abnormality is noted. IMPRESSION: PICC line as described in satisfactory  position. Electronically Signed   By: Inez Catalina M.D.   On: 05/29/2019 16:05   [place] Ekg Site Rite  Result Date: 05/28/2019 If Site Rite image not attached, placement could not be confirmed due to current cardiac rhythm.     Discharge Exam: Vitals:   06/09/19 0500 06/09/19 0832  BP: 103/68 102/66  Pulse: 79 65  Resp: (!) 21 13  Temp: (!) 96.5 F (35.8 C) 98.4 F (36.9 C)  SpO2: 96% 91%   Vitals:   06/08/19 2015 06/08/19 2324 06/09/19 0500 06/09/19 0832  BP: 90/69 97/62 103/68 102/66  Pulse: 70 68 79 65  Resp: 18 17 (!) 21 13  Temp: 98.2 F (36.8 C) 98.2 F (36.8 C) (!) 96.5 F (35.8 C) 98.4 F (36.9 C)  TempSrc: Oral Oral Axillary Oral  SpO2: 95% 93% 96% 91%  Weight:   (!) 173.9 kg   Height:        General: Pt is alert, awake, not in acute distress, morbidly obese Cardiovascular: RRR, S1/S2 +, no rubs, no gallops Respiratory: Very few crackles at the bases, no wheezing, no rhonchi Abdominal: Soft, NT, ND, bowel sounds + Extremities: +3 pitting edema bilateral lower extremity with Ace wrap, no cyanosis    The results of significant diagnostics from this hospitalization (including imaging, microbiology, ancillary and laboratory) are listed below for reference.     Microbiology: No results found for this or any previous visit (from the past 240 hour(s)).   Labs: BNP (last 3 results) Recent Labs    12/23/18 0004 05/25/19 1720 05/29/19 0433  BNP 1,221.0* 740.5* 740.8*   Basic Metabolic Panel: Recent Labs  Lab 06/04/19 0500 06/05/19 0750 06/06/19 0430 06/07/19 0430 06/08/19 0429 06/08/19 1500 06/09/19 0954  NA 135 135 137 134* 133* 134* 133*  K 3.7 3.8 3.5 3.3* 3.2* 3.6 2.7*  CL 96* 96* 101 93* 89* 89* 91*  CO2 29 28 28 28 30  32 30  GLUCOSE 117* 201* 116* 165* 160* 176* 150*  BUN 24* 24* 21 22 27* 29* 29*  CREATININE 1.34* 1.35* 1.15 1.36* 1.47* 1.77* 1.33*  CALCIUM 8.6* 8.6* 8.4* 8.7* 8.9 9.0 8.2*  MG 2.2 2.4 2.4 2.7* 2.6*  --   --    Liver  Function Tests: Recent Labs  Lab 06/08/19 0429 06/09/19 0954  AST 23 20  ALT 24 19  ALKPHOS 100 90  BILITOT 1.2 1.2  PROT 5.8* 4.9*  ALBUMIN 3.1* 2.6*   No results for input(s): LIPASE, AMYLASE in the last 168 hours. No results for input(s): AMMONIA in the last 168 hours. CBC: Recent Labs  Lab 06/03/19 0345 06/04/19 0500 06/05/19 0750 06/06/19 0430 06/07/19 0430  WBC 5.3 5.1 5.0 5.7 6.2  HGB 11.5* 11.7* 11.8* 12.1* 12.2*  HCT 37.4* 36.9* 37.2* 38.5* 39.5  MCV 103.6* 102.8* 102.5* 102.4* 103.4*  PLT 79* 83* 85* 98* 98*   Cardiac Enzymes: No results for input(s): CKTOTAL, CKMB, CKMBINDEX, TROPONINI in the last 168 hours. BNP: Invalid input(s): POCBNP CBG: No results for input(s): GLUCAP in the last 168 hours. D-Dimer No results for input(s): DDIMER in the last 72 hours. Hgb A1c No results for input(s): HGBA1C in the last 72 hours. Lipid Profile No results for input(s): CHOL, HDL, LDLCALC, TRIG, CHOLHDL, LDLDIRECT in the last 72 hours. Thyroid function studies No results for input(s): TSH, T4TOTAL, T3FREE, THYROIDAB in the last 72 hours.  Invalid input(s): FREET3 Anemia work up No results for input(s): VITAMINB12, FOLATE, FERRITIN, TIBC, IRON, RETICCTPCT in the last 72 hours. Urinalysis    Component Value Date/Time   COLORURINE YELLOW 12/23/2018 0030   APPEARANCEUR CLEAR 12/23/2018 0030   LABSPEC 1.009 12/23/2018 0030   LABSPEC 1.015 01/26/2017 1415   PHURINE 5.0 12/23/2018 0030   GLUCOSEU NEGATIVE 12/23/2018 0030   HGBUR SMALL (A) 12/23/2018 0030   BILIRUBINUR NEGATIVE 12/23/2018 0030   KETONESUR NEGATIVE 12/23/2018 0030   PROTEINUR 100 (A) 12/23/2018 0030   UROBILINOGEN 0.2 01/26/2017 1415   NITRITE NEGATIVE 12/23/2018 0030   LEUKOCYTESUR NEGATIVE 12/23/2018 0030   Sepsis Labs Invalid input(s): PROCALCITONIN,  WBC,  LACTICIDVEN Microbiology No results found for this or any previous visit (from the past 240 hour(s)).   Time coordinating discharge:  Over 30 minutes  SIGNED:   Darliss Cheney, MD  [facility] 06/09/2019, 10:49 AM Pager  [phone]  If 7PM-7AM, please contact night-coverage [email or web address] Password [email or web address]

## 2019-06-11 ENCOUNTER — Other Ambulatory Visit: Payer: Self-pay | Admitting: *Deleted

## 2019-06-11 DIAGNOSIS — Z8572 Personal history of non-Hodgkin lymphomas: Secondary | ICD-10-CM

## 2019-06-11 DIAGNOSIS — G8929 Other chronic pain: Secondary | ICD-10-CM

## 2019-06-11 MED ORDER — OXYCODONE HCL ER 40 MG PO T12A: 40.0000 mg | EXTENDED_RELEASE_TABLET | Freq: Two times a day (BID) | ORAL | 0 refills | Status: DC

## 2019-06-11 MED ORDER — OXYCODONE HCL 20 MG PO TABS: 20.0000 mg | ORAL_TABLET | Freq: Three times a day (TID) | ORAL | 0 refills | Status: DC | PRN

## 2019-06-15 DIAGNOSIS — J9611 Chronic respiratory failure with hypoxia: Secondary | ICD-10-CM | POA: Diagnosis not present

## 2019-06-15 DIAGNOSIS — F329 Major depressive disorder, single episode, unspecified: Secondary | ICD-10-CM | POA: Diagnosis not present

## 2019-06-15 DIAGNOSIS — I482 Chronic atrial fibrillation, unspecified: Secondary | ICD-10-CM | POA: Diagnosis not present

## 2019-06-15 DIAGNOSIS — I088 Other rheumatic multiple valve diseases: Secondary | ICD-10-CM | POA: Diagnosis not present

## 2019-06-15 DIAGNOSIS — J449 Chronic obstructive pulmonary disease, unspecified: Secondary | ICD-10-CM | POA: Diagnosis not present

## 2019-06-15 DIAGNOSIS — K7460 Unspecified cirrhosis of liver: Secondary | ICD-10-CM | POA: Diagnosis not present

## 2019-06-15 DIAGNOSIS — E039 Hypothyroidism, unspecified: Secondary | ICD-10-CM | POA: Diagnosis not present

## 2019-06-15 DIAGNOSIS — I5032 Chronic diastolic (congestive) heart failure: Secondary | ICD-10-CM | POA: Diagnosis not present

## 2019-06-15 DIAGNOSIS — D696 Thrombocytopenia, unspecified: Secondary | ICD-10-CM | POA: Diagnosis not present

## 2019-06-15 DIAGNOSIS — M4696 Unspecified inflammatory spondylopathy, lumbar region: Secondary | ICD-10-CM | POA: Diagnosis not present

## 2019-06-15 DIAGNOSIS — K7581 Nonalcoholic steatohepatitis (NASH): Secondary | ICD-10-CM | POA: Diagnosis not present

## 2019-06-15 DIAGNOSIS — I50810 Right heart failure, unspecified: Secondary | ICD-10-CM | POA: Diagnosis not present

## 2019-06-15 DIAGNOSIS — N183 Chronic kidney disease, stage 3 (moderate): Secondary | ICD-10-CM | POA: Diagnosis not present

## 2019-06-15 DIAGNOSIS — F419 Anxiety disorder, unspecified: Secondary | ICD-10-CM | POA: Diagnosis not present

## 2019-06-15 DIAGNOSIS — G4733 Obstructive sleep apnea (adult) (pediatric): Secondary | ICD-10-CM | POA: Diagnosis not present

## 2019-06-15 DIAGNOSIS — I8390 Asymptomatic varicose veins of unspecified lower extremity: Secondary | ICD-10-CM | POA: Diagnosis not present

## 2019-06-26 ENCOUNTER — Ambulatory Visit (HOSPITAL_COMMUNITY)
Admission: RE | Admit: 2019-06-26 | Discharge: 2019-06-26 | Disposition: A | Discharge status: Home / Self Care | Payer: Medicare Other | Source: Ambulatory Visit | Attending: Internal Medicine | Admitting: Internal Medicine

## 2019-06-26 ENCOUNTER — Other Ambulatory Visit: Payer: Self-pay

## 2019-06-26 ENCOUNTER — Encounter (HOSPITAL_COMMUNITY): Payer: Self-pay

## 2019-06-26 VITALS — BP 108/86 | HR 81 | Wt >= 6400 oz

## 2019-06-26 DIAGNOSIS — G4733 Obstructive sleep apnea (adult) (pediatric): Secondary | ICD-10-CM | POA: Diagnosis not present

## 2019-06-26 DIAGNOSIS — Z79890 Hormone replacement therapy (postmenopausal): Secondary | ICD-10-CM | POA: Insufficient documentation

## 2019-06-26 DIAGNOSIS — Z79899 Other long term (current) drug therapy: Secondary | ICD-10-CM | POA: Diagnosis not present

## 2019-06-26 DIAGNOSIS — I5032 Chronic diastolic (congestive) heart failure: Secondary | ICD-10-CM | POA: Diagnosis not present

## 2019-06-26 DIAGNOSIS — Z8572 Personal history of non-Hodgkin lymphomas: Secondary | ICD-10-CM | POA: Insufficient documentation

## 2019-06-26 DIAGNOSIS — Z7901 Long term (current) use of anticoagulants: Secondary | ICD-10-CM | POA: Insufficient documentation

## 2019-06-26 DIAGNOSIS — M47819 Spondylosis without myelopathy or radiculopathy, site unspecified: Secondary | ICD-10-CM | POA: Diagnosis not present

## 2019-06-26 DIAGNOSIS — N183 Chronic kidney disease, stage 3 unspecified: Secondary | ICD-10-CM

## 2019-06-26 DIAGNOSIS — I4819 Other persistent atrial fibrillation: Secondary | ICD-10-CM

## 2019-06-26 DIAGNOSIS — Z8249 Family history of ischemic heart disease and other diseases of the circulatory system: Secondary | ICD-10-CM | POA: Insufficient documentation

## 2019-06-26 DIAGNOSIS — E662 Morbid (severe) obesity with alveolar hypoventilation: Secondary | ICD-10-CM | POA: Insufficient documentation

## 2019-06-26 DIAGNOSIS — Z87891 Personal history of nicotine dependence: Secondary | ICD-10-CM | POA: Insufficient documentation

## 2019-06-26 DIAGNOSIS — I2721 Secondary pulmonary arterial hypertension: Secondary | ICD-10-CM | POA: Insufficient documentation

## 2019-06-26 DIAGNOSIS — J9611 Chronic respiratory failure with hypoxia: Secondary | ICD-10-CM | POA: Insufficient documentation

## 2019-06-26 DIAGNOSIS — Z809 Family history of malignant neoplasm, unspecified: Secondary | ICD-10-CM | POA: Insufficient documentation

## 2019-06-26 DIAGNOSIS — J449 Chronic obstructive pulmonary disease, unspecified: Secondary | ICD-10-CM | POA: Insufficient documentation

## 2019-06-26 DIAGNOSIS — I4821 Permanent atrial fibrillation: Secondary | ICD-10-CM | POA: Insufficient documentation

## 2019-06-26 DIAGNOSIS — E785 Hyperlipidemia, unspecified: Secondary | ICD-10-CM | POA: Diagnosis not present

## 2019-06-26 LAB — BASIC METABOLIC PANEL
Anion gap: 14 (ref 5–15)
BUN: 30 mg/dL — ABNORMAL HIGH (ref 8–23)
CO2: 29 mmol/L (ref 22–32)
Calcium: 8.9 mg/dL (ref 8.9–10.3)
Chloride: 91 mmol/L — ABNORMAL LOW (ref 98–111)
Creatinine, Ser: 1.42 mg/dL — ABNORMAL HIGH (ref 0.61–1.24)
GFR calc Af Amer: 60 mL/min — ABNORMAL LOW (ref >60)
GFR calc non Af Amer: 51 mL/min — ABNORMAL LOW (ref >60)
Glucose, Bld: 145 mg/dL — ABNORMAL HIGH (ref 70–99)
Potassium: 3.6 mmol/L (ref 3.5–5.1)
Sodium: 134 mmol/L — ABNORMAL LOW (ref 135–145)

## 2019-06-26 LAB — BRAIN NATRIURETIC PEPTIDE: B Natriuretic Peptide: 412.7 pg/mL — ABNORMAL HIGH (ref 0.0–100.0)

## 2019-06-26 MED ORDER — TORSEMIDE 100 MG PO TABS: 100.0000 mg | ORAL_TABLET | Freq: Two times a day (BID) | ORAL | 6 refills | Status: AC

## 2019-06-26 MED ORDER — METOLAZONE 2.5 MG PO TABS: 2.5000 mg | ORAL_TABLET | ORAL | 3 refills | Status: AC

## 2019-06-26 NOTE — Patient Instructions (Signed)
Lab work done today. We will notify you of any abnormal lab work. No news is good news.  INCREASE Torsemide to 165m (1 tab) two times daily.  INCREASE Metolazone 2.580mto every Monday, Wednesday and Friday.  [facility] up with the [facility] 2 weeks.  At the [facility] and your health needs are our priority. As part of our continuing mission to provide you with exceptional heart care, we have created designated Provider Care Teams. These Care Teams include your primary Cardiologist (physician) and Advanced Practice Providers (APPs- Physician Assistants and Nurse Practitioners) who all work together to provide you with the care you need, when you need it.   You may see any of the following providers on your designated Care Team at your next follow up: . Marland Kitchenr DaGlori Bickers Dr DaLoralie Champagne AmDarrick GrinderNP   Please be sure to bring in all your medications bottles to every appointment.

## 2019-06-26 NOTE — Progress Notes (Signed)
PCP: Dr Brigitte Pulse Primary Cardiologist: Dr Aundra Dubin   HPI: Albert James is a 63 y.o. male with permanent atrial fibrillation, NASH with cirrhosis, suspected OHS/OSA, and chronic diastolic CHF/RV failure and morbid obesity.   Echo was done in 11/18 showing EF 50-55%, septal flattening, severe RV dilation, PASP 77 mmHg. LHC/RHC in 1/19 showed no CAD, evidence for RV failure and severe pulmonary hypertension.  PFTs in 1/19 showed severe COPD.  V/Q scan in 1/19 showed no evidence for chronic PEs.  Sleep study showed severe OSA, using CPAP.    Admitted 2/7 - 12/30/2018 with generalized weakness and marked volume overload. He diuresed over 50 lbs total.   Admitted 7/10 through 06/09/19. Diuresed with lasix drip + diamox+metolazone. Transitioned to torsemide 100 mg twice a day +metolazone Mon-Friday. Discharge weight 390 pounds.    Today he returns for HF follow up. After discharge he was unable to pick up metolazone. Overall feeling ok. Says his weight has been going up 390--->402 pounds. SOB with exertion. Not moving around much. Denies PND/Orthopnea. Appetite ok. Eating 3 fruit cups a day +watermelon+ cantaloupe.  Using CPAP. No fever or chills. Taking all medications. Followed by [facility].    PMH: 1. Non-hodgkins lymphoma: follicular small cell lymphoma.  S/p treatment, in clinical remission.  2. Cirrhosis: Suspect NASH.  - Abdominal US (10/18): Suggestive of cirrhosis.  3. Hyperlipidemia.  4. Obesity 5. Atrial fibrillation: Suspected permanent. He appears to have been in atrial fibrillation at least back to 2015.  6. Depression 7. Venous insufficiency 8. OHS/OSA: Sleep study with severe OSA.  9. H/o vertebral osteomyelitis.  10. OA knees 11. Chronic diastolic CHF/RV failure: Echo (11/18) with EF 50-55%, septal flattening, RV severely dilated with normal systolic function, PASP 77 mmHg, moderate TR.  Echo 11/02/18: EF 60-65%, RV severly dilated and severly reduced function 12. Pulmonary  hypertension: Suspect due to OHS/OSA and COPD.  - RHC (1/19): mean RA 20, PA 87/37 mean 52, mean PCWP 16, CI 2.12, PVR 5.6 WU.  - V/Q scan 1/19 with no evidence for chronic PE.                                                                                                                  13. Coronary angiography (1/19): No significant CAD.  14. COPD: PFTs in 1/19 suggested severe COPD.   ROS: All systems negative except as listed in HPI, PMH and Problem List.  SH:  Social History   Socioeconomic History  . Marital status: Divorced    Spouse name: Not on file  . Number of children: 2  . Years of education: Not on file  . Highest education level: Not on file  Occupational History  . Occupation: disabled  Social Needs  . Financial resource strain: Not hard at all  . Food insecurity    Worry: Never true    Inability: Never true  . Transportation needs    Medical: No    Non-medical: No  Tobacco Use  . Smoking status: Former  Smoker    Packs/day: 0.50    Years: 8.00    Pack years: 4.00    Types: Cigarettes    Start date: 09/26/1966    Quit date: 06/26/1989    Years since quitting: 30.0  . Smokeless tobacco: Never Used  . Tobacco comment: quit 38 years ago  Substance and Sexual Activity  . Alcohol use: No    Alcohol/week: 0.0 standard drinks  . Drug use: No  . Sexual activity: Not on file  Lifestyle  . Physical activity    Days per week: 0 days    Minutes per session: 0 min  . Stress: Not on file  Relationships  . Social Herbalist on phone: Three times a week    Gets together: Three times a week    Attends religious service: 1 to 4 times per year    Active member of club or organization: No    Attends meetings of clubs or organizations: Never    Relationship status: Divorced  . Intimate partner violence    Fear of current or ex partner: No    Emotionally abused: No    Physically abused: No    Forced sexual activity: No  Other Topics Concern  . Not on  file  Social History Narrative  . Not on file    FH:  Family History  Problem Relation Age of Onset  . Cancer Father        pacemaker  . Bradycardia Father     Past Medical History:  Diagnosis Date  . Abdominal hernia    can' t  fix per pt, it is large  . Anxiety   . Arthritis    lower back  . Back pain   . Chronic diastolic CHF (congestive heart failure) ([place])   . Chronic respiratory failure (HCC)    a. on home O2.  . Cirrhosis ([place])    a. felt due to NASH vs congestive hepatopathy from RV failure, but cannot totally r/o component of portopulmonary HTN.  . CKD (chronic kidney disease), stage III ([place])   . COPD (chronic obstructive pulmonary disease) ([place])    a. severe by PFTs 11/2017.  . Cor pulmonale (chronic) ([place])   . Depression   . Edema of both legs   . Knee pain both knees   wears compression due to edema  . Morbid obesity ([place])   . NASH (nonalcoholic steatohepatitis)   . nhl; dx'd 2006   chemo comp, lymphoma  . Non Hodgkin's lymphoma ([place])    hx of   . Obesity hypoventilation syndrome ([place])   . OSA (obstructive sleep apnea)   . Permanent atrial fibrillation   . Pulmonary hypertension ([place])   . Shoulder pain   . Swelling of lower extremity    both legs, uses compression wraps all the time  . Ulcer    left leg, now healed  . Varicose veins     Current Outpatient Medications  Medication Sig Dispense Refill  . diclofenac sodium (VOLTAREN) 1 % GEL Apply 1 g topically 4 (four) times daily as needed (for knee pain.).    Marland Kitchen ELIQUIS 5 MG TABS tablet TAKE 1 TABLET(5 MG) BY MOUTH TWICE DAILY (Patient taking differently: Take 5 mg by mouth 2 (two) times daily. ) 60 tablet 3  . fluticasone (FLONASE) 50 MCG/ACT nasal spray Place 2 sprays into both nostrils at bedtime.     Marland Kitchen lactulose (CHRONULAC) 10 GM/15ML solution Take 10 g by mouth daily.     Marland Kitchen  levothyroxine (SYNTHROID, LEVOTHROID) 125 MCG tablet Take 125 mcg by mouth daily before breakfast.   0  . metolazone  (ZAROXOLYN) 2.5 MG tablet Take 1 tablet (2.5 mg total) by mouth 2 (two) times a week for 30 doses. 30 tablet 0  . nadolol (CORGARD) 20 MG tablet Take 20 mg by mouth daily.     Marland Kitchen NARCAN 4 MG/0.1ML LIQD nasal spray kit Place 1 spray into the nose as needed (For accidental opoid overdose).   0  . omega-3 acid ethyl esters (LOVAZA) 1 g capsule Take 2 g by mouth 2 (two) times daily.   0  . oxyCODONE (OXYCONTIN) 40 mg 12 hr tablet Take 1 tablet (40 mg total) by mouth every 12 (twelve) hours. 60 tablet 0  . Oxycodone HCl 20 MG TABS Take 1 tablet (20 mg total) by mouth every 8 (eight) hours as needed. 90 tablet 0  . potassium chloride SA (K-DUR) 20 MEQ tablet Take 2 tablets (40 mEq total) by mouth daily. 60 tablet 0  . spironolactone (ALDACTONE) 25 MG tablet Take 1 tablet (25 mg total) by mouth daily. 30 tablet 0  . torsemide (DEMADEX) 20 MG tablet Take 5 tablets (100 mg total) by mouth 2 (two) times daily.    . cycloSPORINE (RESTASIS) 0.05 % ophthalmic emulsion Place 1 drop into both eyes 2 (two) times daily as needed (for eye irritation/dryness).     No current facility-administered medications for this encounter.     Vitals:   06/26/19 1002  BP: 108/86  Pulse: 81  Weight: (!) 183.1 kg (403 lb 9.6 oz)   Wt Readings from Last 3 Encounters:  06/26/19 (!) 183.1 kg (403 lb 9.6 oz)  06/09/19 (!) 173.9 kg (383 lb 6.1 oz)  03/06/19 (!) 172.8 kg (381 lb)     PHYSICAL EXAM: General: Appears chronically ill. No resp difficulty. Arrived in a wheel chair.  HEENT: normal Neck: supple. JVP to jaw.  Carotids 2+ bilaterally; no bruits. No lymphadenopathy or thryomegaly appreciated. Cor: PMI normal. Regular rate & rhythm. No rubs, gallops or murmurs. Lungs: clear Abdomen: obese,  soft, nontender, nondistended. No hepatosplenomegaly. No bruits or masses. Good bowel sounds. Extremities: no cyanosis, clubbing, rash, R and LLE 2+ edema. edema Neuro: alert & orientedx3, cranial nerves grossly intact. Moves  all 4 extremities w/o difficulty. Affect pleasant.   ASSESSMENT & PLAN: 1.Chronic diastolic CHF with prominent RV failure, pulmonary HTN, and severe TR - Suspect RV failure is triggered by pulmonary hypertension. Underlying OHS/OSA and severe COPD have likely caused pulmonary hypertension/RV failure.  NYHA III. Volume status elevated in the setting of medication issues and increased fluid intake.  Continue torsemide 100 mg twice a day . He will take extra metolazone today then M-W-F.  - Check BMET today.    2. Chronic respiratory failure with intermittent hypoxia- multifactorial likely due to CHF, OHS. COPD. . Will need ambulatory sat eval when euvolemic to ensure sending out on new baseline L/min.  3. Pulmonary HTN with super morbid obesity with OHS/OSA - Severe by echo and RHC (pulmonary arterial hypertension). Suspect group 3 disease due to COPD (severe obstruction on PFTs) and OSA/OHS. Prior V/Q scan not suggestive of chronic PEs.  -Needs oxygen continuouslye is on home oxygen and CPAP. Pulmonary vasodilators not felt to be useful in this situation.   4. CKD stage III -  Check BMET today.   5. Permanent atrial fibrillation - Rate controlled. No bleeding issues.  - Continue Eliquis.   Follow up in  2 weeks to reassess. He is at high risk for readmission due to poor insight regarding food choices/fluid intake.  Greater than 50% of the (total minutes 25) visit spent in counseling/coordination of care regarding the above.  Amy Clegg NP-C  11:39 AM

## 2019-07-01 DIAGNOSIS — I272 Pulmonary hypertension, unspecified: Secondary | ICD-10-CM | POA: Diagnosis not present

## 2019-07-01 DIAGNOSIS — R0902 Hypoxemia: Secondary | ICD-10-CM | POA: Diagnosis not present

## 2019-07-01 DIAGNOSIS — I5032 Chronic diastolic (congestive) heart failure: Secondary | ICD-10-CM | POA: Diagnosis not present

## 2019-07-01 DIAGNOSIS — M2550 Pain in unspecified joint: Secondary | ICD-10-CM | POA: Diagnosis not present

## 2019-07-06 ENCOUNTER — Other Ambulatory Visit: Payer: Self-pay | Admitting: *Deleted

## 2019-07-06 DIAGNOSIS — Z8572 Personal history of non-Hodgkin lymphomas: Secondary | ICD-10-CM

## 2019-07-06 DIAGNOSIS — G8929 Other chronic pain: Secondary | ICD-10-CM

## 2019-07-06 MED ORDER — OXYCODONE HCL ER 40 MG PO T12A: 40.0000 mg | EXTENDED_RELEASE_TABLET | Freq: Two times a day (BID) | ORAL | 0 refills | Status: DC

## 2019-07-06 MED ORDER — OXYCODONE HCL 20 MG PO TABS: 20.0000 mg | ORAL_TABLET | Freq: Three times a day (TID) | ORAL | 0 refills | Status: DC | PRN

## 2019-07-10 ENCOUNTER — Telehealth (HOSPITAL_COMMUNITY): Payer: Self-pay | Admitting: Cardiology

## 2019-07-10 NOTE — Telephone Encounter (Signed)
Medication interaction approval given for spiro and KCL ok to fill both medications- [facility] [id number]

## 2019-07-12 ENCOUNTER — Encounter (HOSPITAL_COMMUNITY): Payer: Medicare Other

## 2019-07-17 ENCOUNTER — Other Ambulatory Visit: Payer: Self-pay

## 2019-07-17 ENCOUNTER — Encounter (HOSPITAL_COMMUNITY): Payer: Self-pay

## 2019-07-17 ENCOUNTER — Ambulatory Visit (HOSPITAL_COMMUNITY)
Admission: RE | Admit: 2019-07-17 | Discharge: 2019-07-17 | Disposition: A | Discharge status: Home / Self Care | Payer: Medicare Other | Source: Ambulatory Visit | Attending: Cardiology | Admitting: Cardiology

## 2019-07-17 VITALS — BP 110/83 | HR 62 | Wt 392.0 lb

## 2019-07-17 DIAGNOSIS — J449 Chronic obstructive pulmonary disease, unspecified: Secondary | ICD-10-CM | POA: Insufficient documentation

## 2019-07-17 DIAGNOSIS — K7581 Nonalcoholic steatohepatitis (NASH): Secondary | ICD-10-CM | POA: Insufficient documentation

## 2019-07-17 DIAGNOSIS — Z7901 Long term (current) use of anticoagulants: Secondary | ICD-10-CM | POA: Diagnosis not present

## 2019-07-17 DIAGNOSIS — Z9981 Dependence on supplemental oxygen: Secondary | ICD-10-CM | POA: Insufficient documentation

## 2019-07-17 DIAGNOSIS — I50810 Right heart failure, unspecified: Secondary | ICD-10-CM | POA: Diagnosis not present

## 2019-07-17 DIAGNOSIS — J9611 Chronic respiratory failure with hypoxia: Secondary | ICD-10-CM | POA: Diagnosis not present

## 2019-07-17 DIAGNOSIS — I071 Rheumatic tricuspid insufficiency: Secondary | ICD-10-CM | POA: Insufficient documentation

## 2019-07-17 DIAGNOSIS — Z87891 Personal history of nicotine dependence: Secondary | ICD-10-CM | POA: Insufficient documentation

## 2019-07-17 DIAGNOSIS — I4821 Permanent atrial fibrillation: Secondary | ICD-10-CM | POA: Insufficient documentation

## 2019-07-17 DIAGNOSIS — I272 Pulmonary hypertension, unspecified: Secondary | ICD-10-CM | POA: Insufficient documentation

## 2019-07-17 DIAGNOSIS — I2721 Secondary pulmonary arterial hypertension: Secondary | ICD-10-CM | POA: Diagnosis not present

## 2019-07-17 DIAGNOSIS — Z8572 Personal history of non-Hodgkin lymphomas: Secondary | ICD-10-CM | POA: Insufficient documentation

## 2019-07-17 DIAGNOSIS — N183 Chronic kidney disease, stage 3 (moderate): Secondary | ICD-10-CM | POA: Insufficient documentation

## 2019-07-17 DIAGNOSIS — K7460 Unspecified cirrhosis of liver: Secondary | ICD-10-CM | POA: Diagnosis not present

## 2019-07-17 DIAGNOSIS — I5032 Chronic diastolic (congestive) heart failure: Secondary | ICD-10-CM | POA: Insufficient documentation

## 2019-07-17 DIAGNOSIS — M1990 Unspecified osteoarthritis, unspecified site: Secondary | ICD-10-CM | POA: Insufficient documentation

## 2019-07-17 LAB — BASIC METABOLIC PANEL
Anion gap: 17 — ABNORMAL HIGH (ref 5–15)
BUN: 71 mg/dL — ABNORMAL HIGH (ref 8–23)
CO2: 26 mmol/L (ref 22–32)
Calcium: 9.1 mg/dL (ref 8.9–10.3)
Chloride: 89 mmol/L — ABNORMAL LOW (ref 98–111)
Creatinine, Ser: 2.2 mg/dL — ABNORMAL HIGH (ref 0.61–1.24)
GFR calc Af Amer: 35 mL/min — ABNORMAL LOW (ref >60)
GFR calc non Af Amer: 30 mL/min — ABNORMAL LOW (ref >60)
Glucose, Bld: 148 mg/dL — ABNORMAL HIGH (ref 70–99)
Potassium: 4 mmol/L (ref 3.5–5.1)
Sodium: 132 mmol/L — ABNORMAL LOW (ref 135–145)

## 2019-07-17 NOTE — Progress Notes (Signed)
Advanced Heart Failure Clinic Note   07/17/2019 Albert James   [birth year]  [id number]  Primary Physician Mayra Neer, MD Primary Cardiologist: [name], MD  Electrophysiologist: None   Reason for Visit/CC: 2 week f/u for acute on chronic diastolic HF/ RV failure   HPI:  Albert James a 63 y.o.malewith permanent atrial fibrillation, chronic anticoagulation w/ Eliquis, NASH with cirrhosis, suspected OHS/OSA, COPD, chronic diastolic CHF/RV failure, severe TR and morbid obesity, presenting to clinic for 2 week f/u after recent up titration of home diuretic regimen for volume overload.   Additional background: Echo was done in 11/18 showing EF 50-55%, septal flattening, severe RV dilation, PASP 77 mmHg. LHC/RHC in 1/19 showed no CAD, evidence for RV failure and severe pulmonary hypertension. PFTs in 1/19 showed severe COPD. V/Q scan in 1/19 showed no evidence for chronic PEs. Sleep study showed severe OSA, using CPAP.   Admitted 2/7 - 12/30/2018 with generalized weakness and marked volume overload. He diuresed over 50 lbs total.   Admitted 7/10 through 06/09/19. Diuresed with lasix drip + diamox+metolazone. Transitioned to torsemide 100 mg twice a day +metolazone Mon-Friday. Discharge weight 390 pounds.    He presented to [facility] for post hospital f/u on 06/26/19 and seen by Darrick Grinder, NP.  After discharge he was unable to pick up metolazone. Reported feeling ok but had endorsed a 12 weight gain since discharge, 390--->402 pounds. Was SOB with exertion. Denied PND/Orthopnea. Appetite ok. Had been eating 3 fruit cups a day +watermelon+ cantaloupe.  Using CPAP. Denied fever or chills. Reported med compliance. Followed by [facility].    At visit, labs were obtained. BNP 412. BMP w/ SCr at 1.4. K 3.6. Pt was instructed to increase Torsemide to 100 mg BID and to talk extra metolazone when he arrived home and to continued 3x weekly,  M-W-F.   Today's Visit: he is here with  his daughter. Has changed his diet. Fluid restricting. Monitoring daily weights which have improved. Significant reduction from last OV, down from 403 lb>>392 lb today (d/c weight 320 lb). Eating low salt diet. No resting dyspnea but SOB w/ exertion which is his usual baseline. Fairly sedentary/ super morbidly obese and uses walker w/ ambulation around home. Uses motorized shopping cart when grocery shopping. Supplemental O2 requirements unchanged. Remains at 3.5L/min.    Cardiac Studies   RHC 11/21/2017 Right Heart  Right Heart Pressures RHC Procedural Findings: Hemodynamics (mmHg) RA mean 20 RV 85/22 PA 87/37, mean 52 PCWP mean 16 LV 107/20 AO 95/68  Oxygen saturations: PA 60% AO 92%  Cardiac Output (Fick) 6.41  Cardiac Index (Fick) 2.12 PVR 5.6 WU    2D Echo 05/2019 1. The left ventricle has normal systolic function with an ejection fraction of 60-65%. The cavity size was normal. There is moderately increased left ventricular wall thickness. Left ventricular diastolic Doppler parameters are consistent with impaired  relaxation. There is right ventricular volume and pressure overload.  2. The right ventricle has moderately reduced systolic function. The cavity was moderately enlarged. There is no increase in right ventricular wall thickness. Right ventricular systolic pressure is severely elevated with an estimated pressure of 96.2  mmHg.  3. Left atrial size was mildly dilated.  4. Right atrial size was severely dilated.  5. The mitral valve is grossly normal. Mild thickening of the mitral valve leaflet.  6. Tricuspid valve regurgitation is severe.  7. The aortic valve is tricuspid. Mild thickening of the aortic valve. Mild calcification of the aortic  valve.  8. Pulmonic valve regurgitation is mild to moderate is mild by color flow Doppler.  9. The inferior vena cava was dilated in size with <50% respiratory variability.   No outpatient medications have been marked as taking  for the 07/17/19 encounter [facility] Encounter) with [facility] PA/NP.   Allergies  Allergen Reactions  . Vancomycin Other (See Comments)    "red man" syndrome (when in large quantity)   Past Medical History:  Diagnosis Date  . Abdominal hernia    can' t  fix per pt, it is large  . Anxiety   . Arthritis    lower back  . Back pain   . Chronic diastolic CHF (congestive heart failure) ([place])   . Chronic respiratory failure (HCC)    a. on home O2.  . Cirrhosis ([place])    a. felt due to NASH vs congestive hepatopathy from RV failure, but cannot totally r/o component of portopulmonary HTN.  . CKD (chronic kidney disease), stage III ([place])   . COPD (chronic obstructive pulmonary disease) ([place])    a. severe by PFTs 11/2017.  . Cor pulmonale (chronic) ([place])   . Depression   . Edema of both legs   . Knee pain both knees   wears compression due to edema  . Morbid obesity ([place])   . NASH (nonalcoholic steatohepatitis)   . nhl; dx'd 2006   chemo comp, lymphoma  . Non Hodgkin's lymphoma ([place])    hx of   . Obesity hypoventilation syndrome ([place])   . OSA (obstructive sleep apnea)   . Permanent atrial fibrillation   . Pulmonary hypertension ([place])   . Shoulder pain   . Swelling of lower extremity    both legs, uses compression wraps all the time  . Ulcer    left leg, now healed  . Varicose veins    Family History  Problem Relation Age of Onset  . Cancer Father        pacemaker  . Bradycardia Father    Past Surgical History:  Procedure Laterality Date  . APPENDECTOMY    . biopsy on back at disc     . COLONOSCOPY WITH PROPOFOL N/A 10/24/2014   Procedure: COLONOSCOPY WITH PROPOFOL;  Surgeon: Lear Ng, MD;  Location: [facility];  Service: Endoscopy;  Laterality: N/A;  . ENDOVENOUS ABLATION SAPHENOUS VEIN W/ LASER Left 06-10-2014   EVLA  Left greater saphenous vein by Curt Jews MD    . HERNIA REPAIR  bdomen as child umbilical as adult   x 2   . left elbow surgery      pin out  then pin removed   . PORTACATH PLACEMENT  2000   placed and removed  . portacath removal     . RIGHT/LEFT HEART CATH AND CORONARY ANGIOGRAPHY N/A 11/21/2017   Procedure: RIGHT/LEFT HEART CATH AND CORONARY ANGIOGRAPHY;  Surgeon: Larey Dresser, MD;  Location: [place] [facility];  Service: Cardiovascular;  Laterality: N/A;  . SHOULDER OPEN ROTATOR CUFF REPAIR Left 07/12/2014   Procedure: LEFT SHOULDER MINI OPEN ROTATOR CUFF REPAIR  SUBACROMIAL DECOMPRESSION ;  Surgeon: Johnn Hai, MD;  Location: [facility];  Service: Orthopedics;  Laterality: Left;   Social History   Socioeconomic History  . Marital status: Divorced    Spouse name: Not on file  . Number of children: 2  . Years of education: Not on file  . Highest education level: Not on file  Occupational History  . Occupation: disabled  Social Needs  .  Financial resource strain: Not hard at all  . Food insecurity    Worry: Never true    Inability: Never true  . Transportation needs    Medical: No    Non-medical: No  Tobacco Use  . Smoking status: Former Smoker    Packs/day: 0.50    Years: 8.00    Pack years: 4.00    Types: Cigarettes    Start date: 09/26/1966    Quit date: 06/26/1989    Years since quitting: 30.0  . Smokeless tobacco: Never Used  . Tobacco comment: quit 38 years ago  Substance and Sexual Activity  . Alcohol use: No    Alcohol/week: 0.0 standard drinks  . Drug use: No  . Sexual activity: Not on file  Lifestyle  . Physical activity    Days per week: 0 days    Minutes per session: 0 min  . Stress: Not on file  Relationships  . Social Herbalist on phone: Three times a week    Gets together: Three times a week    Attends religious service: 1 to 4 times per year    Active member of club or organization: No    Attends meetings of clubs or organizations: Never    Relationship status: Divorced  . Intimate partner violence    Fear of current or ex partner: No    Emotionally abused: No     Physically abused: No    Forced sexual activity: No  Other Topics Concern  . Not on file  Social History Narrative  . Not on file     Lipid Panel  No results found for: CHOL, TRIG, HDL, CHOLHDL, VLDL, LDLCALC, LDLDIRECT  Review of Systems: General: negative for chills, fever, night sweats or weight changes.  Cardiovascular: negative for chest pain, dyspnea on exertion, edema, orthopnea, palpitations, paroxysmal nocturnal dyspnea or shortness of breath Dermatological: negative for rash Respiratory: negative for cough or wheezing Urologic: negative for hematuria Abdominal: negative for nausea, vomiting, diarrhea, bright red blood per rectum, melena, or hematemesis Neurologic: negative for visual changes, syncope, or dizziness All other systems reviewed and are otherwise negative except as noted above.   Physical Exam:  Blood pressure 110/83, pulse 62, weight (!) 177.8 kg (392 lb), SpO2 90 %.  General appearance: alert, cooperative, no distress and morbidly obese Neck: no carotid bruit and thick neck, making JVD assessment difficult Lungs: clear to auscultation bilaterally Heart: irregularly irregular rhythm, regular rate, no murmur, click, rub or gallop Abdomen: obese, soft, nontender Extremities: chronic LEE, both LEs wrapped Pulses: 2+ and symmetric Skin: Skin color, texture, turgor normal. No rashes or lesions Neurologic: Grossly normal  EKG not performed -- personally reviewed   ASSESSMENT AND PLAN:   1.Chronic diastolic CHF with prominent RV failure, pulmonary HTN, and severe TR - Suspect RV failure is triggered by pulmonary hypertension. Underlying OHS/OSA and severe COPD have likely caused pulmonary hypertension/RV failure. NYHA III.  -Volume status improved after recent increase of metolazone to 3x/week. Weight down 6 lb since last OV and close to recent hospital d/c weight. -Continue torsemide 100 mg twice a day . -Continue metolazone M-W-F.  -Repeat BMET today.   -Continue home daily weights. Pt advised to call if > 5 lb weight gain in 1 week that dose not improve w/ metolazone. -Continue low salt diet and fluid restriction.   2. Chronic respiratory failure with intermittent hypoxia- multifactorial likely due to CHF, OHS, COPD.   - on supplemental O2, 3.5 L/min  baseline. Stable.   3. Pulmonary HTN with super morbid obesity with OHS/OSA - Severe by echo and RHC (pulmonary arterial hypertension). Suspect group 3 disease due to COPD (severe obstruction on PFTs) and OSA/OHS. Prior V/Q scan not suggestive of chronic PEs.  -Needs oxygen continuously and is on home oxygen and CPAP. Pulmonary vasodilators not felt to be useful in this situation.   4. CKD stage III - Recent increase in home diuretic regimen, Torsemide 100 BID  + metolazone MWF  - Will check BMP today to ensure stable renal function and electrolytes   5. Permanent atrial fibrillation - Rate controlled. No bleeding issues.  -Continue Eliquis.   Follow-Up: plan to f/u again in 4 weeks w/ APP.   Gearold Wainer Ladoris Gene, MHS [facility] 07/17/2019 12:13 PM

## 2019-07-17 NOTE — Patient Instructions (Addendum)
It was great to see you today! No medication changes are needed at this time.   Labs today We will only contact you if something comes back abnormal or we need to make some changes. Otherwise no news is good news!   Your physician recommends that you schedule a follow-up appointment in: 4 weeks    Do the following things EVERYDAY: 1) Weigh yourself in the morning before breakfast. Write it down and keep it in a log. 2) Take your medicines as prescribed 3) Eat low salt foods-Limit salt (sodium) to 2000 mg per day.  4) Stay as active as you can everyday 5) Limit all fluids for the day to less than 2 liters   At [facility], you and your health needs are our priority. As part of our continuing mission to provide you with exceptional heart care, we have created designated Provider Care Teams. These Care Teams include your primary Cardiologist (physician) and Advanced Practice Providers (APPs- Physician Assistants and Nurse Practitioners) who all work together to provide you with the care you need, when you need it.   You may see any of the following providers on your designated Care Team at your next follow up: Marland Kitchen Dr Glori Bickers . Dr [name] . Darrick Grinder, NP   Please be sure to bring in all your medications bottles to every appointment.

## 2019-07-26 ENCOUNTER — Inpatient Hospital Stay: Payer: Medicare Other | Attending: Hematology & Oncology | Admitting: Hematology & Oncology

## 2019-07-26 ENCOUNTER — Telehealth: Payer: Self-pay | Admitting: Hematology & Oncology

## 2019-07-26 ENCOUNTER — Encounter: Payer: Self-pay | Admitting: Hematology & Oncology

## 2019-07-26 ENCOUNTER — Inpatient Hospital Stay: Payer: Medicare Other

## 2019-07-26 ENCOUNTER — Other Ambulatory Visit: Payer: Self-pay

## 2019-07-26 VITALS — BP 97/76 | HR 79 | Temp 97.8°F | Resp 20 | Wt 387.0 lb

## 2019-07-26 DIAGNOSIS — Z8572 Personal history of non-Hodgkin lymphomas: Secondary | ICD-10-CM | POA: Insufficient documentation

## 2019-07-26 DIAGNOSIS — I890 Lymphedema, not elsewhere classified: Secondary | ICD-10-CM | POA: Insufficient documentation

## 2019-07-26 DIAGNOSIS — Z7901 Long term (current) use of anticoagulants: Secondary | ICD-10-CM | POA: Insufficient documentation

## 2019-07-26 DIAGNOSIS — R0602 Shortness of breath: Secondary | ICD-10-CM | POA: Insufficient documentation

## 2019-07-26 DIAGNOSIS — J9691 Respiratory failure, unspecified with hypoxia: Secondary | ICD-10-CM | POA: Diagnosis not present

## 2019-07-26 DIAGNOSIS — I5032 Chronic diastolic (congestive) heart failure: Secondary | ICD-10-CM

## 2019-07-26 DIAGNOSIS — D696 Thrombocytopenia, unspecified: Secondary | ICD-10-CM | POA: Diagnosis not present

## 2019-07-26 DIAGNOSIS — Z9981 Dependence on supplemental oxygen: Secondary | ICD-10-CM | POA: Diagnosis not present

## 2019-07-26 DIAGNOSIS — I270 Primary pulmonary hypertension: Secondary | ICD-10-CM | POA: Diagnosis not present

## 2019-07-26 DIAGNOSIS — I5030 Unspecified diastolic (congestive) heart failure: Secondary | ICD-10-CM | POA: Diagnosis not present

## 2019-07-26 LAB — CBC WITH DIFFERENTIAL (CANCER CENTER ONLY)
Abs Immature Granulocytes: 0.03 10*3/uL (ref 0.00–0.07)
Basophils Absolute: 0 10*3/uL (ref 0.0–0.1)
Basophils Relative: 1 %
Eosinophils Absolute: 0.1 10*3/uL (ref 0.0–0.5)
Eosinophils Relative: 1 %
HCT: 41.6 % (ref 39.0–52.0)
Hemoglobin: 13.2 g/dL (ref 13.0–17.0)
Immature Granulocytes: 0 %
Lymphocytes Relative: 9 %
Lymphs Abs: 0.8 10*3/uL (ref 0.7–4.0)
MCH: 30.9 pg (ref 26.0–34.0)
MCHC: 31.7 g/dL (ref 30.0–36.0)
MCV: 97.4 fL (ref 80.0–100.0)
Monocytes Absolute: 1.1 10*3/uL — ABNORMAL HIGH (ref 0.1–1.0)
Monocytes Relative: 12 %
Neutro Abs: 6.8 10*3/uL (ref 1.7–7.7)
Neutrophils Relative %: 77 %
Platelet Count: 119 10*3/uL — ABNORMAL LOW (ref 150–400)
RBC: 4.27 MIL/uL (ref 4.22–5.81)
RDW: 17.5 % — ABNORMAL HIGH (ref 11.5–15.5)
WBC Count: 8.8 10*3/uL (ref 4.0–10.5)
nRBC: 0 % (ref 0.0–0.2)

## 2019-07-26 LAB — CMP (CANCER CENTER ONLY)
ALT: 21 U/L (ref 0–44)
AST: 21 U/L (ref 15–41)
Albumin: 4 g/dL (ref 3.5–5.0)
Alkaline Phosphatase: 97 U/L (ref 38–126)
Anion gap: 12 (ref 5–15)
BUN: 66 mg/dL — ABNORMAL HIGH (ref 8–23)
CO2: 31 mmol/L (ref 22–32)
Calcium: 9.7 mg/dL (ref 8.9–10.3)
Chloride: 90 mmol/L — ABNORMAL LOW (ref 98–111)
Creatinine: 1.65 mg/dL — ABNORMAL HIGH (ref 0.61–1.24)
GFR, Est AFR Am: 50 mL/min — ABNORMAL LOW (ref >60)
GFR, Estimated: 43 mL/min — ABNORMAL LOW (ref >60)
Glucose, Bld: 159 mg/dL — ABNORMAL HIGH (ref 70–99)
Potassium: 3.8 mmol/L (ref 3.5–5.1)
Sodium: 133 mmol/L — ABNORMAL LOW (ref 135–145)
Total Bilirubin: 1.3 mg/dL — ABNORMAL HIGH (ref 0.3–1.2)
Total Protein: 6.6 g/dL (ref 6.5–8.1)

## 2019-07-26 LAB — PLATELET BY CITRATE

## 2019-07-26 NOTE — Progress Notes (Signed)
Hematology and Oncology Follow Up Visit  Albert James [id number] 10-17-1956 63 y.o. 07/26/2019   Principle Diagnosis:  1. Follicular small cell non-Hodgkin lymphoma, clinical remission. 2. Morbid obesity. 3. Lymphedema of his legs. 4. Vertebral osteomyelitis.   Current Therapy:    Observation     Interim History:  Mr.  James is back for followup.  His main problem continues to be cardiac.  It certainly sounds as if he has right-sided heart failure.  He really has not been able to do all that much.  He can walk a little bit then he has to stop because he gets tired and short of breath.  As far as his blood issues go, these really are not going to be a problem for him.  There is been no problems with bleeding.  Para he has massive lymphedema in his legs which is been chronic.  There is been no problems with fever.  He has been very cautious with trying to isolate himself so that he does not get the coronavirus.  Overall, his performance status is ECOG 3.   Medications:  Current Outpatient Medications:  .  cycloSPORINE (RESTASIS) 0.05 % ophthalmic emulsion, Place 1 drop into both eyes 2 (two) times daily as needed (for eye irritation/dryness)., Disp: , Rfl:  .  diclofenac sodium (VOLTAREN) 1 % GEL, Apply 1 g topically 4 (four) times daily as needed (for knee pain.)., Disp: , Rfl:  .  ELIQUIS 5 MG TABS tablet, TAKE 1 TABLET(5 MG) BY MOUTH TWICE DAILY (Patient taking differently: Take 5 mg by mouth 2 (two) times daily. ), Disp: 60 tablet, Rfl: 3 .  fluticasone (FLONASE) 50 MCG/ACT nasal spray, Place 2 sprays into both nostrils at bedtime. , Disp: , Rfl:  .  lactulose (CHRONULAC) 10 GM/15ML solution, Take 10 g by mouth daily. , Disp: , Rfl:  .  levothyroxine (SYNTHROID, LEVOTHROID) 125 MCG tablet, Take 125 mcg by mouth daily before breakfast. , Disp: , Rfl: 0 .  metolazone (ZAROXOLYN) 2.5 MG tablet, Take 1 tablet (2.5 mg total) by mouth every Monday, Wednesday, and Friday.,  Disp: 15 tablet, Rfl: 3 .  nadolol (CORGARD) 20 MG tablet, Take 20 mg by mouth daily. , Disp: , Rfl:  .  NARCAN 4 MG/0.1ML LIQD nasal spray kit, Place 1 spray into the nose as needed (For accidental opoid overdose). , Disp: , Rfl: 0 .  omega-3 acid ethyl esters (LOVAZA) 1 g capsule, Take 2 g by mouth 2 (two) times daily. , Disp: , Rfl: 0 .  oxyCODONE (OXYCONTIN) 40 mg 12 hr tablet, Take 1 tablet (40 mg total) by mouth every 12 (twelve) hours., Disp: 60 tablet, Rfl: 0 .  Oxycodone HCl 20 MG TABS, Take 1 tablet (20 mg total) by mouth every 8 (eight) hours as needed., Disp: 90 tablet, Rfl: 0 .  potassium chloride SA (K-DUR) 20 MEQ tablet, Take 2 tablets (40 mEq total) by mouth daily., Disp: 60 tablet, Rfl: 0 .  spironolactone (ALDACTONE) 25 MG tablet, Take 1 tablet (25 mg total) by mouth daily., Disp: 30 tablet, Rfl: 0 .  torsemide (DEMADEX) 100 MG tablet, Take 1 tablet (100 mg total) by mouth 2 (two) times daily., Disp: 60 tablet, Rfl: 6  Allergies:  Allergies  Allergen Reactions  . Vancomycin Other (See Comments)    "red man" syndrome (when in large quantity)    Past Medical History, Surgical history, Social history, and Family History were reviewed and updated.  Review of Systems: Review of  Systems  Constitutional: Negative.   HENT: Negative.   Eyes: Negative.   Respiratory: Positive for shortness of breath.   Cardiovascular: Positive for palpitations and leg swelling.  Gastrointestinal: Positive for nausea.  Genitourinary: Negative.   Musculoskeletal: Positive for back pain.  Skin: Negative.   Neurological: Negative.   Endo/Heme/Allergies: Negative.   Psychiatric/Behavioral: Negative.      Physical Exam:  weight is 387 lb (175.5 kg) (abnormal). His temporal temperature is 97.8 F (36.6 C). His blood pressure is 97/76 and his pulse is 79. His respiration is 20 and oxygen saturation is 90%.   Physical Exam  Lab Results  Component Value Date   WBC 8.8 07/26/2019   HGB 13.2  07/26/2019   HCT 41.6 07/26/2019   MCV 97.4 07/26/2019   PLT 119 (L) 07/26/2019     Chemistry      Component Value Date/Time   NA 133 (L) 07/26/2019 1154   NA 140 07/29/2017 1311   NA 142 01/26/2017 1309   K 3.8 07/26/2019 1154   K 4.3 07/29/2017 1311   K 4.0 01/26/2017 1309   CL 90 (L) 07/26/2019 1154   CL 102 07/29/2017 1311   CL 100 01/26/2017 1309   CO2 31 07/26/2019 1154   CO2 30 (H) 07/29/2017 1311   CO2 30 01/26/2017 1309   BUN 66 (H) 07/26/2019 1154   BUN 15 07/29/2017 1311   BUN 15 01/26/2017 1309   CREATININE 1.65 (H) 07/26/2019 1154   CREATININE 1.04 07/29/2017 1311   CREATININE 1.1 01/26/2017 1309      Component Value Date/Time   CALCIUM 9.7 07/26/2019 1154   CALCIUM 9.4 07/29/2017 1311   CALCIUM 9.4 01/26/2017 1309   ALKPHOS 97 07/26/2019 1154   ALKPHOS 88 07/29/2017 1311   ALKPHOS 84 01/26/2017 1309   AST 21 07/26/2019 1154   ALT 21 07/26/2019 1154   ALT 23 01/26/2017 1309   BILITOT 1.3 (H) 07/26/2019 1154         Impression and Plan: Albert James is 63 year old white male. He has a remote history of follicular small cell non-Hodgkin's lymphoma. This really has not been an issue for him for probably 14 years.  Overall, I do still think that he will ever have any count of blood problem.  Even though he has the thrombocytopenia, I really doubt this is ever going to pose a problem for him or should have any impact on his mortality.  Clearly, his mortality is based upon his cardiac status.  We will plan to get him back in another 6 months.  Volanda Napoleon, MD 9/10/202012:48 PM

## 2019-07-26 NOTE — Telephone Encounter (Signed)
lmom to inform patient of March appts per 9/10 LOS

## 2019-07-30 DIAGNOSIS — M4696 Unspecified inflammatory spondylopathy, lumbar region: Secondary | ICD-10-CM | POA: Diagnosis not present

## 2019-07-30 DIAGNOSIS — I5032 Chronic diastolic (congestive) heart failure: Secondary | ICD-10-CM | POA: Diagnosis not present

## 2019-07-30 DIAGNOSIS — D696 Thrombocytopenia, unspecified: Secondary | ICD-10-CM | POA: Diagnosis not present

## 2019-07-30 DIAGNOSIS — E039 Hypothyroidism, unspecified: Secondary | ICD-10-CM | POA: Diagnosis not present

## 2019-07-30 DIAGNOSIS — G4733 Obstructive sleep apnea (adult) (pediatric): Secondary | ICD-10-CM | POA: Diagnosis not present

## 2019-07-30 DIAGNOSIS — F329 Major depressive disorder, single episode, unspecified: Secondary | ICD-10-CM | POA: Diagnosis not present

## 2019-07-30 DIAGNOSIS — I088 Other rheumatic multiple valve diseases: Secondary | ICD-10-CM | POA: Diagnosis not present

## 2019-07-30 DIAGNOSIS — I8390 Asymptomatic varicose veins of unspecified lower extremity: Secondary | ICD-10-CM | POA: Diagnosis not present

## 2019-07-30 DIAGNOSIS — J449 Chronic obstructive pulmonary disease, unspecified: Secondary | ICD-10-CM | POA: Diagnosis not present

## 2019-07-30 DIAGNOSIS — F419 Anxiety disorder, unspecified: Secondary | ICD-10-CM | POA: Diagnosis not present

## 2019-07-30 DIAGNOSIS — I482 Chronic atrial fibrillation, unspecified: Secondary | ICD-10-CM | POA: Diagnosis not present

## 2019-07-30 DIAGNOSIS — I50810 Right heart failure, unspecified: Secondary | ICD-10-CM | POA: Diagnosis not present

## 2019-07-30 DIAGNOSIS — K7581 Nonalcoholic steatohepatitis (NASH): Secondary | ICD-10-CM | POA: Diagnosis not present

## 2019-07-30 DIAGNOSIS — J9611 Chronic respiratory failure with hypoxia: Secondary | ICD-10-CM | POA: Diagnosis not present

## 2019-07-30 DIAGNOSIS — K7460 Unspecified cirrhosis of liver: Secondary | ICD-10-CM | POA: Diagnosis not present

## 2019-07-30 DIAGNOSIS — N183 Chronic kidney disease, stage 3 (moderate): Secondary | ICD-10-CM | POA: Diagnosis not present

## 2019-08-10 ENCOUNTER — Other Ambulatory Visit: Payer: Self-pay | Admitting: *Deleted

## 2019-08-10 DIAGNOSIS — G8929 Other chronic pain: Secondary | ICD-10-CM

## 2019-08-10 DIAGNOSIS — Z8572 Personal history of non-Hodgkin lymphomas: Secondary | ICD-10-CM

## 2019-08-10 MED ORDER — OXYCODONE HCL ER 40 MG PO T12A: 40.0000 mg | EXTENDED_RELEASE_TABLET | Freq: Two times a day (BID) | ORAL | 0 refills | Status: DC

## 2019-08-10 MED ORDER — OXYCODONE HCL 20 MG PO TABS: 20.0000 mg | ORAL_TABLET | Freq: Three times a day (TID) | ORAL | 0 refills | Status: DC | PRN

## 2019-08-16 ENCOUNTER — Encounter (HOSPITAL_COMMUNITY): Payer: Self-pay

## 2019-08-16 ENCOUNTER — Ambulatory Visit (HOSPITAL_COMMUNITY)
Admission: RE | Admit: 2019-08-16 | Discharge: 2019-08-16 | Disposition: A | Discharge status: Home / Self Care | Payer: Medicare Other | Source: Ambulatory Visit | Attending: Internal Medicine | Admitting: Internal Medicine

## 2019-08-16 ENCOUNTER — Other Ambulatory Visit: Payer: Self-pay

## 2019-08-16 VITALS — BP 118/74 | HR 76 | Wt 397.8 lb

## 2019-08-16 DIAGNOSIS — J449 Chronic obstructive pulmonary disease, unspecified: Secondary | ICD-10-CM | POA: Insufficient documentation

## 2019-08-16 DIAGNOSIS — I071 Rheumatic tricuspid insufficiency: Secondary | ICD-10-CM | POA: Diagnosis not present

## 2019-08-16 DIAGNOSIS — G4733 Obstructive sleep apnea (adult) (pediatric): Secondary | ICD-10-CM | POA: Insufficient documentation

## 2019-08-16 DIAGNOSIS — I2721 Secondary pulmonary arterial hypertension: Secondary | ICD-10-CM | POA: Diagnosis not present

## 2019-08-16 DIAGNOSIS — K7460 Unspecified cirrhosis of liver: Secondary | ICD-10-CM | POA: Diagnosis not present

## 2019-08-16 DIAGNOSIS — J9611 Chronic respiratory failure with hypoxia: Secondary | ICD-10-CM | POA: Diagnosis not present

## 2019-08-16 DIAGNOSIS — I4821 Permanent atrial fibrillation: Secondary | ICD-10-CM | POA: Insufficient documentation

## 2019-08-16 DIAGNOSIS — Z8572 Personal history of non-Hodgkin lymphomas: Secondary | ICD-10-CM | POA: Diagnosis not present

## 2019-08-16 DIAGNOSIS — Z7901 Long term (current) use of anticoagulants: Secondary | ICD-10-CM | POA: Diagnosis not present

## 2019-08-16 DIAGNOSIS — Z6841 Body Mass Index (BMI) 40.0 and over, adult: Secondary | ICD-10-CM | POA: Insufficient documentation

## 2019-08-16 DIAGNOSIS — F329 Major depressive disorder, single episode, unspecified: Secondary | ICD-10-CM | POA: Insufficient documentation

## 2019-08-16 DIAGNOSIS — E785 Hyperlipidemia, unspecified: Secondary | ICD-10-CM | POA: Insufficient documentation

## 2019-08-16 DIAGNOSIS — Z79890 Hormone replacement therapy (postmenopausal): Secondary | ICD-10-CM | POA: Diagnosis not present

## 2019-08-16 DIAGNOSIS — Z79899 Other long term (current) drug therapy: Secondary | ICD-10-CM | POA: Diagnosis not present

## 2019-08-16 DIAGNOSIS — I5032 Chronic diastolic (congestive) heart failure: Secondary | ICD-10-CM | POA: Insufficient documentation

## 2019-08-16 DIAGNOSIS — N183 Chronic kidney disease, stage 3 unspecified: Secondary | ICD-10-CM | POA: Diagnosis not present

## 2019-08-16 DIAGNOSIS — Z87891 Personal history of nicotine dependence: Secondary | ICD-10-CM | POA: Diagnosis not present

## 2019-08-16 DIAGNOSIS — K7581 Nonalcoholic steatohepatitis (NASH): Secondary | ICD-10-CM | POA: Insufficient documentation

## 2019-08-16 DIAGNOSIS — I872 Venous insufficiency (chronic) (peripheral): Secondary | ICD-10-CM | POA: Diagnosis not present

## 2019-08-16 NOTE — Progress Notes (Signed)
PCP: Dr Brigitte Pulse Primary Cardiologist: Dr Aundra Dubin   HPI: LEGEND TUMMINELLO is a 63 y.o. male with permanent atrial fibrillation, NASH with cirrhosis, suspected OHS/OSA, and chronic diastolic CHF/RV failure and morbid obesity.   Echo was done in 11/18 showing EF 50-55%, septal flattening, severe RV dilation, PASP 77 mmHg. LHC/RHC in 1/19 showed no CAD, evidence for RV failure and severe pulmonary hypertension.  PFTs in 1/19 showed severe COPD.  V/Q scan in 1/19 showed no evidence for chronic PEs.  Sleep study showed severe OSA, using CPAP.    Admitted 2/7 - 12/30/2018 with generalized weakness and marked volume overload. He diuresed over 50 lbs total.   Admitted 7/10 through 06/09/19. Diuresed with lasix drip + diamox+metolazone. Transitioned to torsemide 100 mg twice a day +metolazone Mon-Friday. Discharge weight 390 pounds.    Today he returns for HF follow up.Overall feeling fair. SOB with exertion.Denies PND/Orthopnea. Limited mobility. Has a hard time leaving the house due to SOB and obesity. Appetite ok. He continues to drink lots of fluids. Eating high salt foods. No fever or chills. He has not been weighing at home.  Taking all medications. Lives with his daughter.   PMH: 1. Non-hodgkins lymphoma: follicular small cell lymphoma.  S/p treatment, in clinical remission.  2. Cirrhosis: Suspect NASH.  - Abdominal US (10/18): Suggestive of cirrhosis.  3. Hyperlipidemia.  4. Obesity 5. Atrial fibrillation: Suspected permanent. He appears to have been in atrial fibrillation at least back to 2015.  6. Depression 7. Venous insufficiency 8. OHS/OSA: Sleep study with severe OSA.  9. H/o vertebral osteomyelitis.  10. OA knees 11. Chronic diastolic CHF/RV failure: Echo (11/18) with EF 50-55%, septal flattening, RV severely dilated with normal systolic function, PASP 77 mmHg, moderate TR.  Echo 11/02/18: EF 60-65%, RV severly dilated and severly reduced function 12. Pulmonary hypertension: Suspect  due to OHS/OSA and COPD.  - RHC (1/19): mean RA 20, PA 87/37 mean 52, mean PCWP 16, CI 2.12, PVR 5.6 WU.  - V/Q scan 1/19 with no evidence for chronic PE.                                                                                                                  13. Coronary angiography (1/19): No significant CAD.  14. COPD: PFTs in 1/19 suggested severe COPD.   ROS: All systems negative except as listed in HPI, PMH and Problem List.  SH:  Social History   Socioeconomic History  . Marital status: Divorced    Spouse name: Not on file  . Number of children: 2  . Years of education: Not on file  . Highest education level: Not on file  Occupational History  . Occupation: disabled  Social Needs  . Financial resource strain: Not hard at all  . Food insecurity    Worry: Never true    Inability: Never true  . Transportation needs    Medical: No    Non-medical: No  Tobacco Use  . Smoking status: Former Smoker  Packs/day: 0.50    Years: 8.00    Pack years: 4.00    Types: Cigarettes    Start date: 09/26/1966    Quit date: 06/26/1989    Years since quitting: 30.1  . Smokeless tobacco: Never Used  . Tobacco comment: quit 38 years ago  Substance and Sexual Activity  . Alcohol use: No    Alcohol/week: 0.0 standard drinks  . Drug use: No  . Sexual activity: Not on file  Lifestyle  . Physical activity    Days per week: 0 days    Minutes per session: 0 min  . Stress: Not on file  Relationships  . Social Herbalist on phone: Three times a week    Gets together: Three times a week    Attends religious service: 1 to 4 times per year    Active member of club or organization: No    Attends meetings of clubs or organizations: Never    Relationship status: Divorced  . Intimate partner violence    Fear of current or ex partner: No    Emotionally abused: No    Physically abused: No    Forced sexual activity: No  Other Topics Concern  . Not on file  Social History  Narrative  . Not on file    FH:  Family History  Problem Relation Age of Onset  . Cancer Father        pacemaker  . Bradycardia Father     Past Medical History:  Diagnosis Date  . Abdominal hernia    can' t  fix per pt, it is large  . Anxiety   . Arthritis    lower back  . Back pain   . Chronic diastolic CHF (congestive heart failure) ([place])   . Chronic respiratory failure (HCC)    a. on home O2.  . Cirrhosis ([place])    a. felt due to NASH vs congestive hepatopathy from RV failure, but cannot totally r/o component of portopulmonary HTN.  . CKD (chronic kidney disease), stage III   . COPD (chronic obstructive pulmonary disease) ([place])    a. severe by PFTs 11/2017.  . Cor pulmonale (chronic) ([place])   . Depression   . Edema of both legs   . Knee pain both knees   wears compression due to edema  . Morbid obesity ([place])   . NASH (nonalcoholic steatohepatitis)   . nhl; dx'd 2006   chemo comp, lymphoma  . Non Hodgkin's lymphoma ([place])    hx of   . Obesity hypoventilation syndrome ([place])   . OSA (obstructive sleep apnea)   . Permanent atrial fibrillation ([place])   . Pulmonary hypertension ([place])   . Shoulder pain   . Swelling of lower extremity    both legs, uses compression wraps all the time  . Ulcer    left leg, now healed  . Varicose veins     Current Outpatient Medications  Medication Sig Dispense Refill  . diclofenac sodium (VOLTAREN) 1 % GEL Apply 1 g topically 4 (four) times daily as needed (for knee pain.).    Marland Kitchen ELIQUIS 5 MG TABS tablet TAKE 1 TABLET(5 MG) BY MOUTH TWICE DAILY (Patient taking differently: Take 5 mg by mouth 2 (two) times daily. ) 60 tablet 3  . fluticasone (FLONASE) 50 MCG/ACT nasal spray Place 2 sprays into both nostrils at bedtime.     Marland Kitchen lactulose (CHRONULAC) 10 GM/15ML solution Take 10 g by mouth daily.     Marland Kitchen  levothyroxine (SYNTHROID, LEVOTHROID) 125 MCG tablet Take 125 mcg by mouth daily before breakfast.   0  . metolazone (ZAROXOLYN) 2.5 MG tablet  Take 1 tablet (2.5 mg total) by mouth every Monday, Wednesday, and Friday. 15 tablet 3  . nadolol (CORGARD) 20 MG tablet Take 20 mg by mouth daily.     Marland Kitchen NARCAN 4 MG/0.1ML LIQD nasal spray kit Place 1 spray into the nose as needed (For accidental opoid overdose).   0  . omega-3 acid ethyl esters (LOVAZA) 1 g capsule Take 2 g by mouth 2 (two) times daily.   0  . oxyCODONE (OXYCONTIN) 40 mg 12 hr tablet Take 1 tablet (40 mg total) by mouth every 12 (twelve) hours. 60 tablet 0  . Oxycodone HCl 20 MG TABS Take 1 tablet (20 mg total) by mouth every 8 (eight) hours as needed. 90 tablet 0  . potassium chloride SA (K-DUR) 20 MEQ tablet Take 2 tablets (40 mEq total) by mouth daily. 60 tablet 0  . spironolactone (ALDACTONE) 25 MG tablet Take 1 tablet (25 mg total) by mouth daily. 30 tablet 0  . torsemide (DEMADEX) 100 MG tablet Take 1 tablet (100 mg total) by mouth 2 (two) times daily. 60 tablet 6  . cycloSPORINE (RESTASIS) 0.05 % ophthalmic emulsion Place 1 drop into both eyes 2 (two) times daily as needed (for eye irritation/dryness).     No current facility-administered medications for this encounter.     Vitals:   08/16/19 1138  BP: 118/74  Pulse: 76  SpO2: (!) 89%  Weight: (!) 180.4 kg (397 lb 12.8 oz)   Wt Readings from Last 3 Encounters:  08/16/19 (!) 180.4 kg (397 lb 12.8 oz)  07/26/19 (!) 175.5 kg (387 lb)  07/17/19 (!) 177.8 kg (392 lb)     PHYSICAL EXAM: General:  No resp difficulty. Arrived in a wheel chair.  HEENT: normal Neck: supple. JVP to jaw. . Carotids 2+ bilat; no bruits. No lymphadenopathy or thryomegaly appreciated. Cor: PMI nondisplaced. Regular rate & rhythm. No rubs, gallops or murmurs. Lungs: clear on 2 liters.  Abdomen: obese, soft, nontender, nondistended. No hepatosplenomegaly. No bruits or masses. Good bowel sounds. Extremities: no cyanosis, clubbing, rash, R and LLE compression wraps.  Neuro: alert & orientedx3, cranial nerves grossly intact. moves all 4  extremities w/o difficulty. Affect pleasant   ASSESSMENT & PLAN: 1.Chronic diastolic CHF with prominent RV failure, pulmonary HTN, and severe TR - Suspect RV failure is triggered by pulmonary hypertension. Underlying OHS/OSA and severe COPD have likely caused pulmonary hypertension/RV failure.  NYHA IIIb. Volume status elevated in the setting of excessive fluid intake and high sodium diet.  Educated on low salt food choices and to limit fluid intake to < 2 liters per day.  Plan to continue torsemide 100 mg twice a day . Continue metolazone today then M-W-F and he will take extra metolazone today and on Friday.   2. Chronic respiratory failure with intermittent hypoxia- multifactorial likely due to CHF, OHS. COPD. . Will need ambulatory sat eval when euvolemic to ensure sending out on new baseline L/min.  3. Pulmonary HTN with super morbid obesity with OHS/OSA - Severe by echo and RHC (pulmonary arterial hypertension). Suspect group 3 disease due to COPD (severe obstruction on PFTs) and OSA/OHS. Prior V/Q scan not suggestive of chronic PEs.  -Continue home oxygen and CPAP at night.   4. CKD stage III -  Check BMET next visit.   5. Permanent atrial fibrillation - Rate controlled.  No bleeding issues.  - Continue Eliquis.   6. Morbid Obesity Body mass index is 55.48 kg/m.  -Discussed portion control at length.    Greater than 50% of the (total minutes 25 ) visit spent in counseling/coordination of care regarding low salt food choices, limiting fluid intake, and medications changes.  He remains at high risk for readmission due to poor insight.   Shuntia Exton NP-C  11:45 AM

## 2019-08-16 NOTE — Patient Instructions (Signed)
THIS Thursday AND Saturday ONLY: please take an extra 1 tab of Metolazone.   Please follow up with the [facility] in 4 weeks.  At the [facility], you and your health needs are our priority. As part of our continuing mission to provide you with exceptional heart care, we have created designated Provider Care Teams. These Care Teams include your primary Cardiologist (physician) and Advanced Practice Providers (APPs- Physician Assistants and Nurse Practitioners) who all work together to provide you with the care you need, when you need it.   You may see any of the following providers on your designated Care Team at your next follow up: Marland Kitchen Dr Glori Bickers . Dr [name] . Darrick Grinder, NP   Please be sure to bring in all your medications bottles to every appointment.

## 2019-08-18 DIAGNOSIS — Z23 Encounter for immunization: Secondary | ICD-10-CM | POA: Diagnosis not present

## 2019-09-04 ENCOUNTER — Other Ambulatory Visit (HOSPITAL_COMMUNITY): Payer: Self-pay | Admitting: Cardiology

## 2019-09-11 ENCOUNTER — Other Ambulatory Visit: Payer: Self-pay | Admitting: *Deleted

## 2019-09-11 DIAGNOSIS — G8929 Other chronic pain: Secondary | ICD-10-CM

## 2019-09-11 DIAGNOSIS — M549 Dorsalgia, unspecified: Secondary | ICD-10-CM

## 2019-09-11 DIAGNOSIS — Z8572 Personal history of non-Hodgkin lymphomas: Secondary | ICD-10-CM

## 2019-09-11 MED ORDER — OXYCODONE HCL 20 MG PO TABS: 20.0000 mg | ORAL_TABLET | Freq: Three times a day (TID) | ORAL | 0 refills | Status: DC | PRN

## 2019-09-11 MED ORDER — OXYCODONE HCL ER 40 MG PO T12A: 40.0000 mg | EXTENDED_RELEASE_TABLET | Freq: Two times a day (BID) | ORAL | 0 refills | Status: DC

## 2019-09-18 ENCOUNTER — Other Ambulatory Visit: Payer: Self-pay

## 2019-09-18 ENCOUNTER — Ambulatory Visit (HOSPITAL_COMMUNITY)
Admission: RE | Admit: 2019-09-18 | Discharge: 2019-09-18 | Disposition: A | Discharge status: Home / Self Care | Payer: Medicare Other | Source: Ambulatory Visit | Attending: Cardiology | Admitting: Cardiology

## 2019-09-18 ENCOUNTER — Encounter (HOSPITAL_COMMUNITY): Payer: Self-pay

## 2019-09-18 ENCOUNTER — Telehealth (HOSPITAL_COMMUNITY): Payer: Self-pay | Admitting: *Deleted

## 2019-09-18 VITALS — BP 128/64 | HR 62 | Wt 397.8 lb

## 2019-09-18 DIAGNOSIS — Z6841 Body Mass Index (BMI) 40.0 and over, adult: Secondary | ICD-10-CM | POA: Insufficient documentation

## 2019-09-18 DIAGNOSIS — I272 Pulmonary hypertension, unspecified: Secondary | ICD-10-CM | POA: Insufficient documentation

## 2019-09-18 DIAGNOSIS — Z87891 Personal history of nicotine dependence: Secondary | ICD-10-CM | POA: Insufficient documentation

## 2019-09-18 DIAGNOSIS — Z7951 Long term (current) use of inhaled steroids: Secondary | ICD-10-CM | POA: Diagnosis not present

## 2019-09-18 DIAGNOSIS — Z7901 Long term (current) use of anticoagulants: Secondary | ICD-10-CM | POA: Insufficient documentation

## 2019-09-18 DIAGNOSIS — J449 Chronic obstructive pulmonary disease, unspecified: Secondary | ICD-10-CM | POA: Diagnosis not present

## 2019-09-18 DIAGNOSIS — Z8572 Personal history of non-Hodgkin lymphomas: Secondary | ICD-10-CM | POA: Insufficient documentation

## 2019-09-18 DIAGNOSIS — I5032 Chronic diastolic (congestive) heart failure: Secondary | ICD-10-CM | POA: Diagnosis not present

## 2019-09-18 DIAGNOSIS — E785 Hyperlipidemia, unspecified: Secondary | ICD-10-CM | POA: Diagnosis not present

## 2019-09-18 DIAGNOSIS — J9611 Chronic respiratory failure with hypoxia: Secondary | ICD-10-CM | POA: Insufficient documentation

## 2019-09-18 DIAGNOSIS — Z79899 Other long term (current) drug therapy: Secondary | ICD-10-CM | POA: Insufficient documentation

## 2019-09-18 DIAGNOSIS — Z79890 Hormone replacement therapy (postmenopausal): Secondary | ICD-10-CM | POA: Diagnosis not present

## 2019-09-18 DIAGNOSIS — E662 Morbid (severe) obesity with alveolar hypoventilation: Secondary | ICD-10-CM | POA: Insufficient documentation

## 2019-09-18 DIAGNOSIS — M1990 Unspecified osteoarthritis, unspecified site: Secondary | ICD-10-CM | POA: Diagnosis not present

## 2019-09-18 DIAGNOSIS — I872 Venous insufficiency (chronic) (peripheral): Secondary | ICD-10-CM | POA: Insufficient documentation

## 2019-09-18 DIAGNOSIS — Z9981 Dependence on supplemental oxygen: Secondary | ICD-10-CM | POA: Diagnosis not present

## 2019-09-18 DIAGNOSIS — Z8249 Family history of ischemic heart disease and other diseases of the circulatory system: Secondary | ICD-10-CM | POA: Diagnosis not present

## 2019-09-18 DIAGNOSIS — N183 Chronic kidney disease, stage 3 unspecified: Secondary | ICD-10-CM | POA: Diagnosis not present

## 2019-09-18 DIAGNOSIS — K7581 Nonalcoholic steatohepatitis (NASH): Secondary | ICD-10-CM | POA: Insufficient documentation

## 2019-09-18 DIAGNOSIS — G4733 Obstructive sleep apnea (adult) (pediatric): Secondary | ICD-10-CM

## 2019-09-18 DIAGNOSIS — I4821 Permanent atrial fibrillation: Secondary | ICD-10-CM | POA: Diagnosis not present

## 2019-09-18 LAB — BASIC METABOLIC PANEL
Anion gap: 19 — ABNORMAL HIGH (ref 5–15)
BUN: 72 mg/dL — ABNORMAL HIGH (ref 8–23)
CO2: 28 mmol/L (ref 22–32)
Calcium: 9 mg/dL (ref 8.9–10.3)
Chloride: 83 mmol/L — ABNORMAL LOW (ref 98–111)
Creatinine, Ser: 1.67 mg/dL — ABNORMAL HIGH (ref 0.61–1.24)
GFR calc Af Amer: 49 mL/min — ABNORMAL LOW (ref >60)
GFR calc non Af Amer: 42 mL/min — ABNORMAL LOW (ref >60)
Glucose, Bld: 167 mg/dL — ABNORMAL HIGH (ref 70–99)
Potassium: 3 mmol/L — ABNORMAL LOW (ref 3.5–5.1)
Sodium: 130 mmol/L — ABNORMAL LOW (ref 135–145)

## 2019-09-18 MED ORDER — POTASSIUM CHLORIDE CRYS ER 20 MEQ PO TBCR: 40.0000 meq | EXTENDED_RELEASE_TABLET | Freq: Two times a day (BID) | ORAL | 6 refills | Status: AC

## 2019-09-18 NOTE — Progress Notes (Signed)
PCP: Dr Brigitte Pulse Primary Cardiologist: Dr Aundra Dubin   HPI: Albert James is a 63 y.o. male with permanent atrial fibrillation, NASH with cirrhosis, suspected OHS/OSA, and chronic diastolic CHF/RV failure and morbid obesity.   Echo was done in 11/18 showing EF 50-55%, septal flattening, severe RV dilation, PASP 77 mmHg. LHC/RHC in 1/19 showed no CAD, evidence for RV failure and severe pulmonary hypertension.  PFTs in 1/19 showed severe COPD.  V/Q scan in 1/19 showed no evidence for chronic PEs.  Sleep study showed severe OSA, using CPAP.    Admitted 2/7 - 12/30/2018 with generalized weakness and marked volume overload. He diuresed over 50 lbs total.   Admitted 7/10 through 06/09/19. Diuresed with lasix drip + diamox+metolazone. Transitioned to torsemide 100 mg twice a day +metolazone Mon-Friday. Discharge weight 390 pounds.    Today he returns for HF follow up. Last visit metolazone was increased to M-W-F. Says he has increased urine output.  Overall feeling fine. Remains SOB with min exertion. Denies  PND/Orthopnea. Using CPAP. Appetite ok. His daughter reports that he eats large portions. No bleeding issues. No fever or chills. Weight at home 391-395  pounds. Taking all medications.   PMH: 1. Non-hodgkins lymphoma: follicular small cell lymphoma.  S/p treatment, in clinical remission.  2. Cirrhosis: Suspect NASH.  - Abdominal US (10/18): Suggestive of cirrhosis.  3. Hyperlipidemia.  4. Obesity 5. Atrial fibrillation: Suspected permanent. He appears to have been in atrial fibrillation at least back to 2015.  6. Depression 7. Venous insufficiency 8. OHS/OSA: Sleep study with severe OSA.  9. H/o vertebral osteomyelitis.  10. OA knees 11. Chronic diastolic CHF/RV failure: Echo (11/18) with EF 50-55%, septal flattening, RV severely dilated with normal systolic function, PASP 77 mmHg, moderate TR.  Echo 11/02/18: EF 60-65%, RV severly dilated and severly reduced function 12. Pulmonary  hypertension: Suspect due to OHS/OSA and COPD.  - RHC (1/19): mean RA 20, PA 87/37 mean 52, mean PCWP 16, CI 2.12, PVR 5.6 WU.  - V/Q scan 1/19 with no evidence for chronic PE.                                                                                                                  13. Coronary angiography (1/19): No significant CAD.  14. COPD: PFTs in 1/19 suggested severe COPD.   ROS: All systems negative except as listed in HPI, PMH and Problem List.  SH:  Social History   Socioeconomic History  . Marital status: Divorced    Spouse name: Not on file  . Number of children: 2  . Years of education: Not on file  . Highest education level: Not on file  Occupational History  . Occupation: disabled  Social Needs  . Financial resource strain: Not hard at all  . Food insecurity    Worry: Never true    Inability: Never true  . Transportation needs    Medical: No    Non-medical: No  Tobacco Use  . Smoking status: Former Smoker  Packs/day: 0.50    Years: 8.00    Pack years: 4.00    Types: Cigarettes    Start date: 09/26/1966    Quit date: 06/26/1989    Years since quitting: 30.2  . Smokeless tobacco: Never Used  . Tobacco comment: quit 38 years ago  Substance and Sexual Activity  . Alcohol use: No    Alcohol/week: 0.0 standard drinks  . Drug use: No  . Sexual activity: Not on file  Lifestyle  . Physical activity    Days per week: 0 days    Minutes per session: 0 min  . Stress: Not on file  Relationships  . Social Herbalist on phone: Three times a week    Gets together: Three times a week    Attends religious service: 1 to 4 times per year    Active member of club or organization: No    Attends meetings of clubs or organizations: Never    Relationship status: Divorced  . Intimate partner violence    Fear of current or ex partner: No    Emotionally abused: No    Physically abused: No    Forced sexual activity: No  Other Topics Concern  . Not on  file  Social History Narrative  . Not on file    FH:  Family History  Problem Relation Age of Onset  . Cancer Father        pacemaker  . Bradycardia Father     Past Medical History:  Diagnosis Date  . Abdominal hernia    can' t  fix per pt, it is large  . Anxiety   . Arthritis    lower back  . Back pain   . Chronic diastolic CHF (congestive heart failure) ([place])   . Chronic respiratory failure (H[facility])    a. on home O2.  . Cirrhosis ([place])    a. felt due to NASH vs congestive hepatopathy from RV failure, but cannot totally r/o component of portopulmonary HTN.  . CKD (chronic kidney disease), stage III   . COPD (chronic obstructive pulmonary disease) ([place])    a. severe by PFTs 11/2017.  . Cor pulmonale (chronic) ([place])   . Depression   . Edema of both legs   . Knee pain both knees   wears compression due to edema  . Morbid obesity ([place])   . NASH (nonalcoholic steatohepatitis)   . nhl; dx'd 2006   chemo comp, lymphoma  . Non Hodgkin's lymphoma ([place])    hx of   . Obesity hypoventilation syndrome ([place])   . OSA (obstructive sleep apnea)   . Permanent atrial fibrillation ([place])   . Pulmonary hypertension ([place])   . Shoulder pain   . Swelling of lower extremity    both legs, uses compression wraps all the time  . Ulcer    left leg, now healed  . Varicose veins     Current Outpatient Medications  Medication Sig Dispense Refill  . cycloSPORINE (RESTASIS) 0.05 % ophthalmic emulsion Place 1 drop into both eyes 2 (two) times daily as needed (for eye irritation/dryness).    Marland Kitchen diclofenac sodium (VOLTAREN) 1 % GEL Apply 1 g topically 4 (four) times daily as needed (for knee pain.).    Marland Kitchen ELIQUIS 5 MG TABS tablet TAKE 1 TABLET(5 MG) BY MOUTH TWICE DAILY 60 tablet 3  . fluticasone (FLONASE) 50 MCG/ACT nasal spray Place 2 sprays into both nostrils at bedtime.     Marland Kitchen lactulose (CHRONULAC) 10  GM/15ML solution Take 10 g by mouth daily.     Marland Kitchen levothyroxine (SYNTHROID, LEVOTHROID) 125 MCG  tablet Take 125 mcg by mouth daily before breakfast.   0  . metolazone (ZAROXOLYN) 2.5 MG tablet Take 1 tablet (2.5 mg total) by mouth every Monday, Wednesday, and Friday. 15 tablet 3  . nadolol (CORGARD) 20 MG tablet Take 20 mg by mouth daily.     Marland Kitchen NARCAN 4 MG/0.1ML LIQD nasal spray kit Place 1 spray into the nose as needed (For accidental opoid overdose).   0  . omega-3 acid ethyl esters (LOVAZA) 1 g capsule Take 2 g by mouth 2 (two) times daily.   0  . oxyCODONE (OXYCONTIN) 40 mg 12 hr tablet Take 1 tablet (40 mg total) by mouth every 12 (twelve) hours. 60 tablet 0  . Oxycodone HCl 20 MG TABS Take 1 tablet (20 mg total) by mouth every 8 (eight) hours as needed. 90 tablet 0  . potassium chloride SA (K-DUR) 20 MEQ tablet Take 2 tablets (40 mEq total) by mouth daily. 60 tablet 0  . spironolactone (ALDACTONE) 25 MG tablet Take 1 tablet (25 mg total) by mouth daily. 30 tablet 0  . torsemide (DEMADEX) 100 MG tablet Take 1 tablet (100 mg total) by mouth 2 (two) times daily. 60 tablet 6   No current facility-administered medications for this encounter.     Vitals:   09/18/19 1126  BP: 128/64  Pulse: 62  SpO2: 94%  Weight: (!) 180.4 kg (397 lb 12.8 oz)   Wt Readings from Last 3 Encounters:  09/18/19 (!) 180.4 kg (397 lb 12.8 oz)  08/16/19 (!) 180.4 kg (397 lb 12.8 oz)  07/26/19 (!) 175.5 kg (387 lb)     PHYSICAL EXAM: General:  Arrived in wheelchair. On oxygen.  No resp difficulty HEENT: normal Neck: supple. JVP 6-7. Carotids 2+ bilat; no bruits. No lymphadenopathy or thryomegaly appreciated. Cor: PMI nondisplaced. Irregular rate & rhythm. No rubs, gallops or murmurs. Lungs: clear Abdomen: soft, nontender, nondistended. No hepatosplenomegaly. No bruits or masses. Good bowel sounds. Extremities: no cyanosis, clubbing, rash, R and LLE wraps.  Neuro: alert & orientedx3, cranial nerves grossly intact. moves all 4 extremities w/o difficulty. Affect pleasant  ASSESSMENT & PLAN: 1.Chronic  diastolic CHF with prominent RV failure, pulmonary HTN, and severe TR - Suspect RV failure is triggered by pulmonary hypertension. Underlying OHS/OSA and severe COPD have likely caused pulmonary hypertension/RV failure.  NYHA IIIb. Volume status stable. Plan to continue torsemide 100 mg twice a day . Continue metolazone M-W-F. Check renal function now.   2. Chronic respiratory failure with intermittent hypoxia- multifactorial likely due to CHF, OHS. COPD.  Continue home oxygen 2 liters.   3. Pulmonary HTN with super morbid obesity with OHS/OSA - Severe by echo and RHC (pulmonary arterial hypertension). Suspect group 3 disease due to COPD (severe obstruction on PFTs) and OSA/OHS. Prior V/Q scan not suggestive of chronic PEs.  -Continue home oxygen and CPAP at night.   4. CKD stage III -  Check BMET today.   5. Permanent atrial fibrillation - Rate controlled.   - Continue Eliquis.   6. Morbid Obesity Body mass index is 55.48 kg/m.  Discussed portion control.   Follow up in 8 weeks. He remains difficult to manage due to high sodium intake. Check BMET today.     NP-C  11:38 AM

## 2019-09-18 NOTE — Telephone Encounter (Signed)
-----   Message from Conrad Bledsoe, NP sent at 09/18/2019  4:04 PM EST ----- Potassium low. Please call increase potassium to 40 meq twice a day. Repeat BMET in 7 days.

## 2019-09-18 NOTE — Telephone Encounter (Signed)
Notes recorded by Scarlette Calico, RN on 09/18/2019 at 5:05 PM EST  Spoke w/pt, he is aware, agreeable and verbalizes understanding, repeat lab sch for Tue 11/10

## 2019-09-18 NOTE — Patient Instructions (Addendum)
Labs done today, we will notify you for abnormal results  Your physician recommends that you schedule a follow-up appointment in: 8 weeks  If you have any questions or concerns before your next appointment please send [place] a message through [place] or call our office at [phone].  At the [facility], you and your health needs are our priority. As part of our continuing mission to provide you with exceptional heart care, we have created designated Provider Care Teams. These Care Teams include your primary Cardiologist (physician) and Advanced Practice Providers (APPs- Physician Assistants and Nurse Practitioners) who all work together to provide you with the care you need, when you need it.   You may see any of the following providers on your designated Care Team at your next follow up: Marland Kitchen Dr Glori Bickers . Dr [name] . Darrick Grinder, NP . Lyda Jester, PA   Please be sure to bring in all your medications bottles to every appointment.

## 2019-09-25 ENCOUNTER — Other Ambulatory Visit: Payer: Self-pay

## 2019-09-25 ENCOUNTER — Ambulatory Visit (HOSPITAL_COMMUNITY)
Admission: RE | Admit: 2019-09-25 | Discharge: 2019-09-25 | Disposition: A | Discharge status: Home / Self Care | Payer: Medicare Other | Source: Ambulatory Visit | Attending: Internal Medicine | Admitting: Internal Medicine

## 2019-09-25 DIAGNOSIS — I5032 Chronic diastolic (congestive) heart failure: Secondary | ICD-10-CM

## 2019-09-25 LAB — BASIC METABOLIC PANEL
Anion gap: 13 (ref 5–15)
BUN: 77 mg/dL — ABNORMAL HIGH (ref 8–23)
CO2: 27 mmol/L (ref 22–32)
Calcium: 8.8 mg/dL — ABNORMAL LOW (ref 8.9–10.3)
Chloride: 93 mmol/L — ABNORMAL LOW (ref 98–111)
Creatinine, Ser: 1.99 mg/dL — ABNORMAL HIGH (ref 0.61–1.24)
GFR calc Af Amer: 40 mL/min — ABNORMAL LOW (ref >60)
GFR calc non Af Amer: 34 mL/min — ABNORMAL LOW (ref >60)
Glucose, Bld: 133 mg/dL — ABNORMAL HIGH (ref 70–99)
Potassium: 4.1 mmol/L (ref 3.5–5.1)
Sodium: 133 mmol/L — ABNORMAL LOW (ref 135–145)

## 2019-10-04 DIAGNOSIS — I482 Chronic atrial fibrillation, unspecified: Secondary | ICD-10-CM | POA: Diagnosis not present

## 2019-10-04 DIAGNOSIS — N183 Chronic kidney disease, stage 3 unspecified: Secondary | ICD-10-CM | POA: Diagnosis not present

## 2019-10-04 DIAGNOSIS — E039 Hypothyroidism, unspecified: Secondary | ICD-10-CM | POA: Diagnosis not present

## 2019-10-05 DIAGNOSIS — I2781 Cor pulmonale (chronic): Secondary | ICD-10-CM | POA: Diagnosis not present

## 2019-10-05 DIAGNOSIS — N183 Chronic kidney disease, stage 3 unspecified: Secondary | ICD-10-CM | POA: Diagnosis not present

## 2019-10-05 DIAGNOSIS — Z9981 Dependence on supplemental oxygen: Secondary | ICD-10-CM | POA: Diagnosis not present

## 2019-10-05 DIAGNOSIS — J9691 Respiratory failure, unspecified with hypoxia: Secondary | ICD-10-CM | POA: Diagnosis not present

## 2019-10-09 ENCOUNTER — Other Ambulatory Visit: Payer: Self-pay | Admitting: *Deleted

## 2019-10-09 DIAGNOSIS — Z8572 Personal history of non-Hodgkin lymphomas: Secondary | ICD-10-CM

## 2019-10-09 DIAGNOSIS — G8929 Other chronic pain: Secondary | ICD-10-CM

## 2019-10-09 MED ORDER — OXYCODONE HCL 20 MG PO TABS: 20.0000 mg | ORAL_TABLET | Freq: Three times a day (TID) | ORAL | 0 refills | Status: AC | PRN

## 2019-10-09 MED ORDER — OXYCODONE HCL ER 40 MG PO T12A: 40.0000 mg | EXTENDED_RELEASE_TABLET | Freq: Two times a day (BID) | ORAL | 0 refills | Status: AC

## 2019-10-29 ENCOUNTER — Telehealth: Payer: Self-pay | Admitting: *Deleted

## 2019-10-29 NOTE — Telephone Encounter (Signed)
Message received from patient requesting a refill of Oxycontin and Oxycodone.  Call placed back to patient and message left to notify patient that refill is not due until next week.  Instructed pt to call office back with any questions.

## 2019-11-16 DEATH — deceased

## 2019-11-20 ENCOUNTER — Encounter (HOSPITAL_COMMUNITY): Payer: Medicare Other | Admitting: Cardiology

## 2020-01-23 ENCOUNTER — Other Ambulatory Visit: Payer: Medicare Other

## 2020-01-23 ENCOUNTER — Ambulatory Visit: Payer: Medicare Other | Admitting: Hematology & Oncology

## 2020-01-24 ENCOUNTER — Inpatient Hospital Stay: Payer: Self-pay

## 2020-01-24 ENCOUNTER — Inpatient Hospital Stay: Payer: Self-pay | Admitting: Hematology & Oncology

## 2020-08-18 IMAGING — CR CHEST - 2 VIEW
2 series · 2 of 2 positions shown · non-contrast
Comparison: 12/23/2018

CLINICAL DATA: Shortness of breath

EXAM:
CHEST - 2 VIEW

[chest pa]
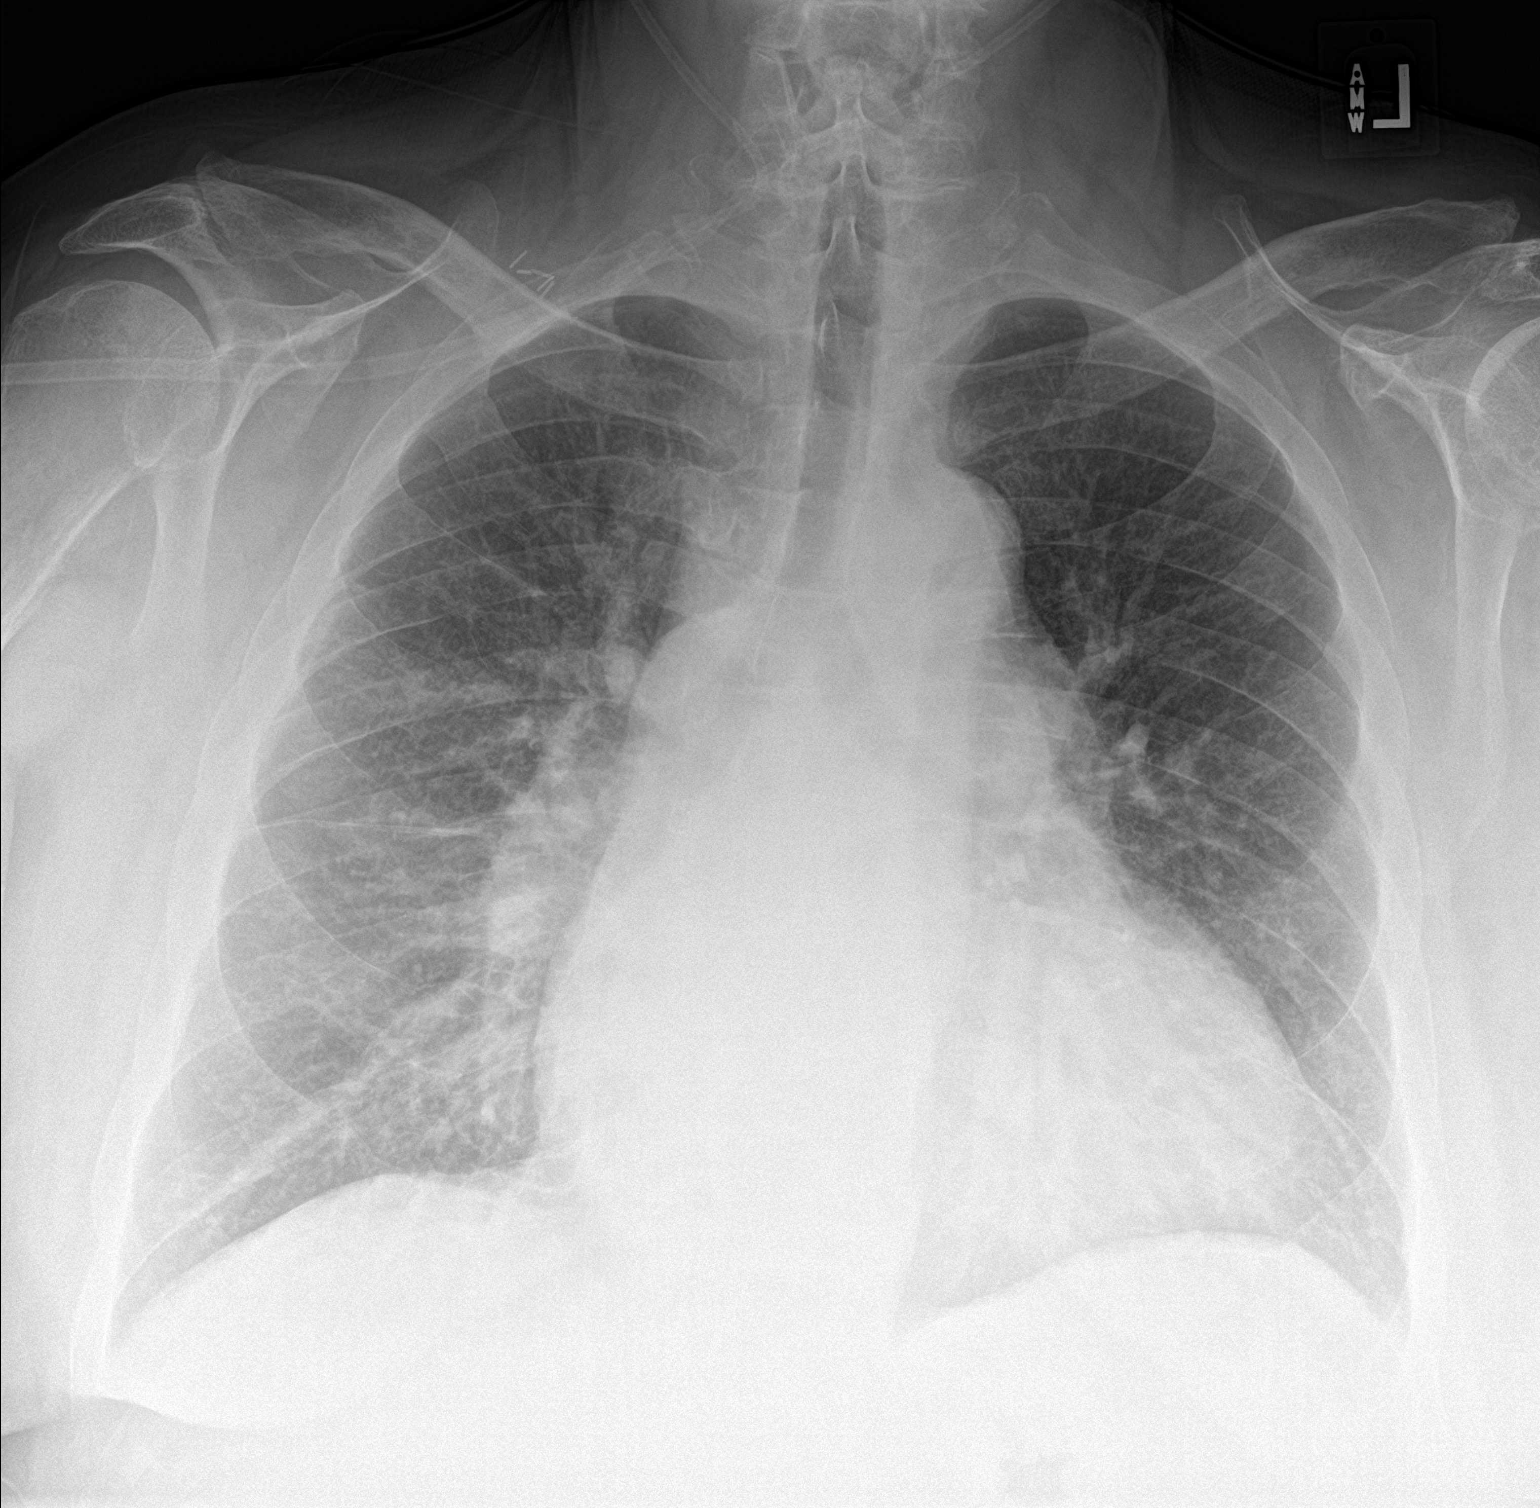

[chest lat]
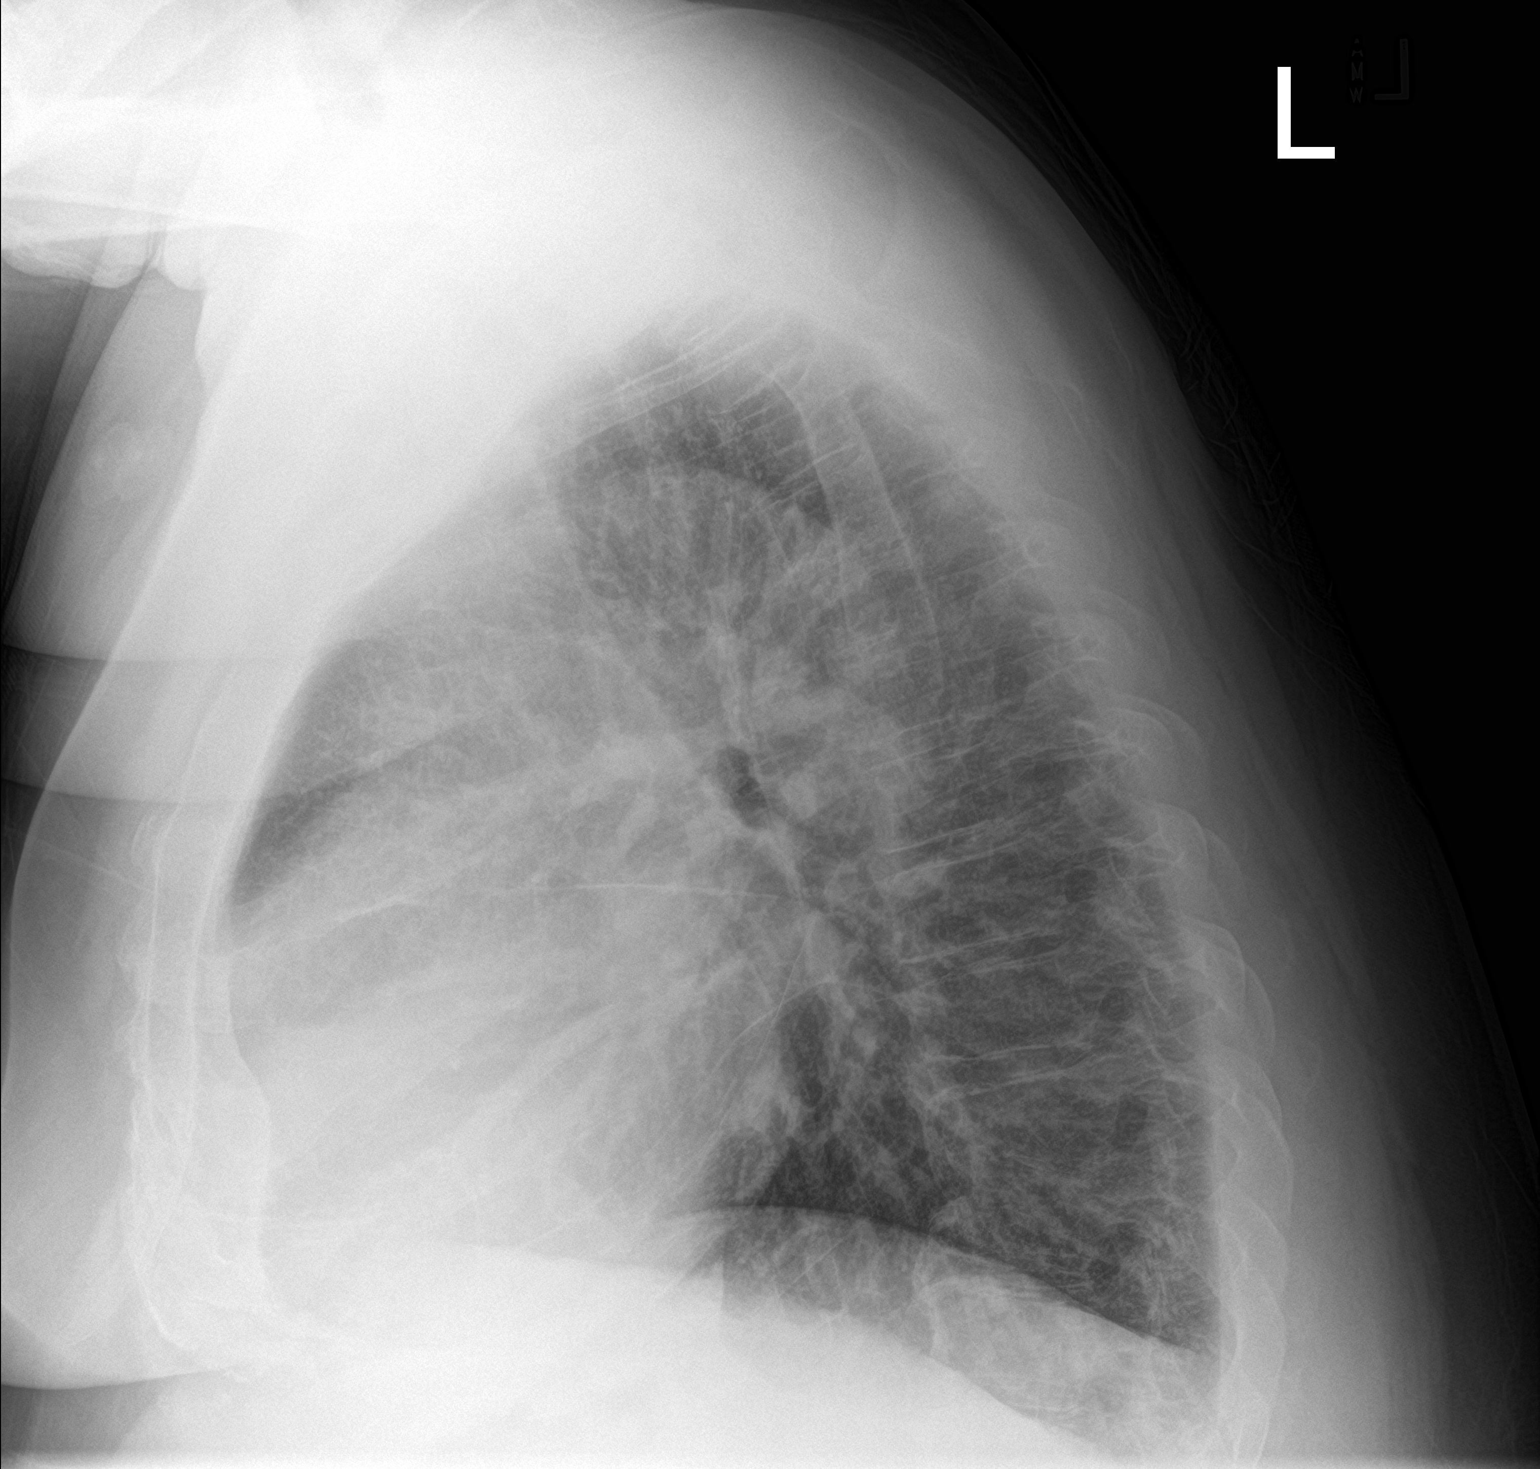

[2 of 2 positions shown; findings below may reference images not displayed]

FINDINGS: Enlargement of cardiac silhouette with pulmonary vascular
congestion.

Mediastinal contours unchanged.

Minimal interstitial edema.

No pleural effusion or pneumothorax.

Bones demineralized.
IMPRESSION: Enlargement of cardiac silhouette with pulmonary vascular congestion
and minimal pulmonary edema.
